# Patient Record
Sex: Female | Born: 1975 | Race: Black or African American | Hispanic: Refuse to answer | Marital: Married
Health system: Midwestern US, Community
[De-identification: ages and names within clinical notes are randomized; demographics above are authoritative.]

## PROBLEM LIST (undated history)

## (undated) ENCOUNTER — Emergency Department (HOSPITAL_BASED_OUTPATIENT_CLINIC_OR_DEPARTMENT_OTHER): Admission: EM

## (undated) DIAGNOSIS — F32A Depression, unspecified: Secondary | ICD-10-CM

## (undated) DIAGNOSIS — F319 Bipolar disorder, unspecified: Secondary | ICD-10-CM

## (undated) DIAGNOSIS — F419 Anxiety disorder, unspecified: Secondary | ICD-10-CM

## (undated) DIAGNOSIS — M1990 Unspecified osteoarthritis, unspecified site: Secondary | ICD-10-CM

## (undated) DIAGNOSIS — F909 Attention-deficit hyperactivity disorder, unspecified type: Secondary | ICD-10-CM

## (undated) DIAGNOSIS — F329 Major depressive disorder, single episode, unspecified: Secondary | ICD-10-CM

## (undated) DIAGNOSIS — F3181 Bipolar II disorder: Secondary | ICD-10-CM

## (undated) DIAGNOSIS — I10 Essential (primary) hypertension: Secondary | ICD-10-CM

## (undated) DIAGNOSIS — N289 Disorder of kidney and ureter, unspecified: Secondary | ICD-10-CM

## (undated) HISTORY — PX: GASTRIC BYPASS: SHX52

## (undated) HISTORY — DX: Attention-deficit hyperactivity disorder, unspecified type: F90.9

## (undated) HISTORY — DX: Bipolar II disorder: F31.81

## (undated) HISTORY — PX: COLONOSCOPY: SHX174

## (undated) HISTORY — PX: KNEE SURGERY: SHX244

## (undated) HISTORY — PX: CHOLECYSTECTOMY: SHX55

---

## 1998-07-07 ENCOUNTER — Encounter: Admission: RE | Admit: 1998-07-07 | Discharge: 1998-07-07 | Payer: Self-pay | Admitting: *Deleted

## 1998-10-16 ENCOUNTER — Ambulatory Visit (HOSPITAL_COMMUNITY): Admission: RE | Admit: 1998-10-16 | Discharge: 1998-10-16 | Payer: Self-pay | Admitting: Family Medicine

## 1998-10-16 ENCOUNTER — Encounter: Payer: Self-pay | Admitting: Family Medicine

## 1999-04-16 ENCOUNTER — Ambulatory Visit (HOSPITAL_COMMUNITY): Admission: RE | Admit: 1999-04-16 | Discharge: 1999-04-16 | Payer: Self-pay | Admitting: Family Medicine

## 1999-04-16 ENCOUNTER — Encounter: Payer: Self-pay | Admitting: Family Medicine

## 1999-07-15 ENCOUNTER — Encounter: Payer: Self-pay | Admitting: Emergency Medicine

## 1999-07-15 ENCOUNTER — Emergency Department (HOSPITAL_COMMUNITY): Admission: EM | Admit: 1999-07-15 | Discharge: 1999-07-15 | Payer: Self-pay | Admitting: Emergency Medicine

## 1999-08-28 ENCOUNTER — Other Ambulatory Visit: Admission: RE | Admit: 1999-08-28 | Discharge: 1999-08-28 | Payer: Self-pay | Admitting: *Deleted

## 1999-09-30 ENCOUNTER — Emergency Department (HOSPITAL_COMMUNITY): Admission: EM | Admit: 1999-09-30 | Discharge: 1999-09-30 | Payer: Self-pay | Admitting: Emergency Medicine

## 1999-09-30 ENCOUNTER — Encounter: Payer: Self-pay | Admitting: Emergency Medicine

## 1999-10-09 ENCOUNTER — Encounter: Payer: Self-pay | Admitting: Family Medicine

## 1999-10-09 ENCOUNTER — Ambulatory Visit (HOSPITAL_COMMUNITY): Admission: RE | Admit: 1999-10-09 | Discharge: 1999-10-09 | Payer: Self-pay | Admitting: Family Medicine

## 1999-12-24 ENCOUNTER — Emergency Department (HOSPITAL_COMMUNITY): Admission: EM | Admit: 1999-12-24 | Discharge: 1999-12-24 | Payer: Self-pay | Admitting: Emergency Medicine

## 1999-12-24 ENCOUNTER — Other Ambulatory Visit: Admission: RE | Admit: 1999-12-24 | Discharge: 1999-12-24 | Payer: Self-pay | Admitting: General Surgery

## 2002-08-26 ENCOUNTER — Inpatient Hospital Stay (HOSPITAL_COMMUNITY): Admission: AD | Admit: 2002-08-26 | Discharge: 2002-08-31 | Payer: Self-pay | Admitting: *Deleted

## 2002-08-26 ENCOUNTER — Encounter: Payer: Self-pay | Admitting: *Deleted

## 2002-09-28 ENCOUNTER — Inpatient Hospital Stay (HOSPITAL_COMMUNITY): Admission: EM | Admit: 2002-09-28 | Discharge: 2002-10-01 | Payer: Self-pay | Admitting: Internal Medicine

## 2002-09-29 ENCOUNTER — Encounter: Payer: Self-pay | Admitting: Gynecology

## 2002-10-23 ENCOUNTER — Other Ambulatory Visit: Admission: RE | Admit: 2002-10-23 | Discharge: 2002-10-23 | Payer: Self-pay | Admitting: *Deleted

## 2002-11-06 ENCOUNTER — Ambulatory Visit (HOSPITAL_COMMUNITY): Admission: RE | Admit: 2002-11-06 | Discharge: 2002-11-06 | Payer: Self-pay | Admitting: *Deleted

## 2002-12-14 ENCOUNTER — Inpatient Hospital Stay (HOSPITAL_COMMUNITY): Admission: AD | Admit: 2002-12-14 | Discharge: 2002-12-14 | Payer: Self-pay | Admitting: Gynecology

## 2002-12-18 ENCOUNTER — Inpatient Hospital Stay (HOSPITAL_COMMUNITY): Admission: AD | Admit: 2002-12-18 | Discharge: 2002-12-18 | Payer: Self-pay | Admitting: Gynecology

## 2003-02-04 ENCOUNTER — Inpatient Hospital Stay (HOSPITAL_COMMUNITY): Admission: AD | Admit: 2003-02-04 | Discharge: 2003-02-04 | Payer: Self-pay | Admitting: Gynecology

## 2003-03-11 ENCOUNTER — Observation Stay (HOSPITAL_COMMUNITY): Admission: AD | Admit: 2003-03-11 | Discharge: 2003-03-11 | Payer: Self-pay | Admitting: Gynecology

## 2003-04-22 ENCOUNTER — Encounter (INDEPENDENT_AMBULATORY_CARE_PROVIDER_SITE_OTHER): Payer: Self-pay | Admitting: Specialist

## 2003-04-22 ENCOUNTER — Inpatient Hospital Stay (HOSPITAL_COMMUNITY): Admission: AD | Admit: 2003-04-22 | Discharge: 2003-04-26 | Payer: Self-pay | Admitting: Gynecology

## 2003-04-22 ENCOUNTER — Encounter: Payer: Self-pay | Admitting: Gynecology

## 2003-05-04 ENCOUNTER — Observation Stay (HOSPITAL_COMMUNITY): Admission: AD | Admit: 2003-05-04 | Discharge: 2003-05-05 | Payer: Self-pay | Admitting: Gynecology

## 2003-05-06 ENCOUNTER — Inpatient Hospital Stay (HOSPITAL_COMMUNITY): Admission: AD | Admit: 2003-05-06 | Discharge: 2003-05-06 | Payer: Self-pay | Admitting: Gynecology

## 2003-06-13 ENCOUNTER — Other Ambulatory Visit: Admission: RE | Admit: 2003-06-13 | Discharge: 2003-06-13 | Payer: Self-pay | Admitting: Gynecology

## 2004-05-08 ENCOUNTER — Emergency Department (HOSPITAL_COMMUNITY): Admission: EM | Admit: 2004-05-08 | Discharge: 2004-05-09 | Payer: Self-pay | Admitting: Emergency Medicine

## 2004-10-24 ENCOUNTER — Emergency Department (HOSPITAL_COMMUNITY): Admission: EM | Admit: 2004-10-24 | Discharge: 2004-10-25 | Payer: Self-pay | Admitting: Emergency Medicine

## 2004-11-21 ENCOUNTER — Emergency Department (HOSPITAL_COMMUNITY): Admission: EM | Admit: 2004-11-21 | Discharge: 2004-11-21 | Payer: Self-pay | Admitting: Emergency Medicine

## 2005-01-18 ENCOUNTER — Emergency Department (HOSPITAL_COMMUNITY): Admission: EM | Admit: 2005-01-18 | Discharge: 2005-01-18 | Payer: Self-pay | Admitting: Emergency Medicine

## 2005-03-01 ENCOUNTER — Ambulatory Visit (HOSPITAL_COMMUNITY): Admission: RE | Admit: 2005-03-01 | Discharge: 2005-03-01 | Payer: Self-pay | Admitting: Cardiology

## 2005-03-07 ENCOUNTER — Emergency Department (HOSPITAL_COMMUNITY): Admission: EM | Admit: 2005-03-07 | Discharge: 2005-03-07 | Payer: Self-pay | Admitting: Emergency Medicine

## 2005-04-14 ENCOUNTER — Emergency Department (HOSPITAL_COMMUNITY): Admission: EM | Admit: 2005-04-14 | Discharge: 2005-04-14 | Payer: Self-pay | Admitting: Emergency Medicine

## 2005-06-15 ENCOUNTER — Emergency Department (HOSPITAL_COMMUNITY): Admission: EM | Admit: 2005-06-15 | Discharge: 2005-06-16 | Payer: Self-pay | Admitting: Emergency Medicine

## 2005-07-08 ENCOUNTER — Emergency Department (HOSPITAL_COMMUNITY): Admission: EM | Admit: 2005-07-08 | Discharge: 2005-07-08 | Payer: Self-pay | Admitting: Emergency Medicine

## 2005-08-10 ENCOUNTER — Encounter: Admission: RE | Admit: 2005-08-10 | Discharge: 2005-08-10 | Payer: Self-pay | Admitting: Internal Medicine

## 2005-09-10 ENCOUNTER — Ambulatory Visit (HOSPITAL_COMMUNITY): Admission: RE | Admit: 2005-09-10 | Discharge: 2005-09-10 | Payer: Self-pay | Admitting: Family Medicine

## 2005-09-20 ENCOUNTER — Emergency Department (HOSPITAL_COMMUNITY): Admission: EM | Admit: 2005-09-20 | Discharge: 2005-09-20 | Payer: Self-pay | Admitting: Emergency Medicine

## 2005-10-22 ENCOUNTER — Emergency Department (HOSPITAL_COMMUNITY): Admission: EM | Admit: 2005-10-22 | Discharge: 2005-10-22 | Payer: Self-pay | Admitting: Emergency Medicine

## 2006-01-10 ENCOUNTER — Encounter: Admission: RE | Admit: 2006-01-10 | Discharge: 2006-01-10 | Payer: Self-pay | Admitting: Family Medicine

## 2006-01-14 ENCOUNTER — Emergency Department (HOSPITAL_COMMUNITY): Admission: EM | Admit: 2006-01-14 | Discharge: 2006-01-14 | Payer: Self-pay | Admitting: Emergency Medicine

## 2006-01-29 ENCOUNTER — Emergency Department (HOSPITAL_COMMUNITY): Admission: EM | Admit: 2006-01-29 | Discharge: 2006-01-30 | Payer: Self-pay | Admitting: Emergency Medicine

## 2006-01-30 ENCOUNTER — Emergency Department (HOSPITAL_COMMUNITY): Admission: EM | Admit: 2006-01-30 | Discharge: 2006-01-30 | Payer: Self-pay | Admitting: Emergency Medicine

## 2006-07-12 ENCOUNTER — Emergency Department (HOSPITAL_COMMUNITY): Admission: EM | Admit: 2006-07-12 | Discharge: 2006-07-13 | Payer: Self-pay | Admitting: Emergency Medicine

## 2006-07-30 ENCOUNTER — Emergency Department (HOSPITAL_COMMUNITY): Admission: EM | Admit: 2006-07-30 | Discharge: 2006-07-31 | Payer: Self-pay | Admitting: Emergency Medicine

## 2006-09-22 ENCOUNTER — Other Ambulatory Visit: Admission: RE | Admit: 2006-09-22 | Discharge: 2006-09-22 | Payer: Self-pay | Admitting: Family Medicine

## 2008-06-19 ENCOUNTER — Emergency Department (HOSPITAL_COMMUNITY): Admission: EM | Admit: 2008-06-19 | Discharge: 2008-06-19 | Payer: Self-pay | Admitting: Emergency Medicine

## 2008-06-25 ENCOUNTER — Encounter (INDEPENDENT_AMBULATORY_CARE_PROVIDER_SITE_OTHER): Payer: Self-pay | Admitting: *Deleted

## 2008-06-25 ENCOUNTER — Ambulatory Visit (HOSPITAL_COMMUNITY): Admission: RE | Admit: 2008-06-25 | Discharge: 2008-06-26 | Payer: Self-pay | Admitting: *Deleted

## 2008-06-26 ENCOUNTER — Emergency Department (HOSPITAL_COMMUNITY): Admission: EM | Admit: 2008-06-26 | Discharge: 2008-06-26 | Payer: Self-pay | Admitting: Emergency Medicine

## 2008-11-14 ENCOUNTER — Emergency Department (HOSPITAL_COMMUNITY): Admission: EM | Admit: 2008-11-14 | Discharge: 2008-11-14 | Payer: Self-pay | Admitting: Emergency Medicine

## 2008-12-15 ENCOUNTER — Emergency Department (HOSPITAL_COMMUNITY): Admission: EM | Admit: 2008-12-15 | Discharge: 2008-12-16 | Payer: Self-pay | Admitting: Emergency Medicine

## 2009-04-11 ENCOUNTER — Encounter: Admission: RE | Admit: 2009-04-11 | Discharge: 2009-04-11 | Payer: Self-pay | Admitting: Family Medicine

## 2009-06-09 ENCOUNTER — Emergency Department (HOSPITAL_BASED_OUTPATIENT_CLINIC_OR_DEPARTMENT_OTHER): Admission: EM | Admit: 2009-06-09 | Discharge: 2009-06-09 | Payer: Self-pay | Admitting: Emergency Medicine

## 2009-07-15 ENCOUNTER — Emergency Department (HOSPITAL_BASED_OUTPATIENT_CLINIC_OR_DEPARTMENT_OTHER): Admission: EM | Admit: 2009-07-15 | Discharge: 2009-07-15 | Payer: Self-pay | Admitting: Emergency Medicine

## 2009-09-19 ENCOUNTER — Ambulatory Visit: Payer: Self-pay | Admitting: Diagnostic Radiology

## 2009-09-19 ENCOUNTER — Emergency Department (HOSPITAL_BASED_OUTPATIENT_CLINIC_OR_DEPARTMENT_OTHER): Admission: EM | Admit: 2009-09-19 | Discharge: 2009-09-19 | Payer: Self-pay | Admitting: Emergency Medicine

## 2009-11-11 ENCOUNTER — Encounter: Admission: RE | Admit: 2009-11-11 | Discharge: 2009-11-11 | Payer: Self-pay | Admitting: Internal Medicine

## 2009-12-25 ENCOUNTER — Emergency Department (HOSPITAL_BASED_OUTPATIENT_CLINIC_OR_DEPARTMENT_OTHER): Admission: EM | Admit: 2009-12-25 | Discharge: 2009-12-26 | Payer: Self-pay | Admitting: Emergency Medicine

## 2009-12-25 ENCOUNTER — Ambulatory Visit: Payer: Self-pay | Admitting: Diagnostic Radiology

## 2010-02-23 ENCOUNTER — Ambulatory Visit: Payer: Self-pay | Admitting: Diagnostic Radiology

## 2010-02-23 ENCOUNTER — Emergency Department (HOSPITAL_BASED_OUTPATIENT_CLINIC_OR_DEPARTMENT_OTHER): Admission: EM | Admit: 2010-02-23 | Discharge: 2010-02-23 | Payer: Self-pay | Admitting: Emergency Medicine

## 2010-03-19 ENCOUNTER — Emergency Department (HOSPITAL_BASED_OUTPATIENT_CLINIC_OR_DEPARTMENT_OTHER): Admission: EM | Admit: 2010-03-19 | Discharge: 2010-03-19 | Payer: Self-pay | Admitting: Emergency Medicine

## 2010-04-27 ENCOUNTER — Encounter: Admission: RE | Admit: 2010-04-27 | Discharge: 2010-04-27 | Payer: Self-pay | Admitting: Surgery

## 2010-05-27 ENCOUNTER — Ambulatory Visit: Payer: Self-pay | Admitting: Radiology

## 2010-05-27 ENCOUNTER — Emergency Department (HOSPITAL_BASED_OUTPATIENT_CLINIC_OR_DEPARTMENT_OTHER): Admission: EM | Admit: 2010-05-27 | Discharge: 2010-05-27 | Payer: Self-pay | Admitting: Emergency Medicine

## 2010-10-17 ENCOUNTER — Emergency Department (HOSPITAL_BASED_OUTPATIENT_CLINIC_OR_DEPARTMENT_OTHER): Admission: EM | Admit: 2010-10-17 | Discharge: 2010-10-17 | Payer: Self-pay | Admitting: Emergency Medicine

## 2010-11-12 ENCOUNTER — Emergency Department (HOSPITAL_COMMUNITY)
Admission: EM | Admit: 2010-11-12 | Discharge: 2010-11-12 | Payer: Self-pay | Source: Home / Self Care | Admitting: Emergency Medicine

## 2010-12-10 ENCOUNTER — Emergency Department (HOSPITAL_COMMUNITY)
Admission: EM | Admit: 2010-12-10 | Discharge: 2010-12-10 | Payer: Self-pay | Source: Home / Self Care | Admitting: Emergency Medicine

## 2011-01-03 ENCOUNTER — Encounter: Payer: Self-pay | Admitting: Surgery

## 2011-01-03 ENCOUNTER — Encounter: Payer: Self-pay | Admitting: Cardiology

## 2011-01-03 ENCOUNTER — Encounter: Payer: Self-pay | Admitting: Family Medicine

## 2011-01-06 ENCOUNTER — Ambulatory Visit: Admit: 2011-01-06 | Payer: Self-pay | Admitting: Internal Medicine

## 2011-02-10 ENCOUNTER — Emergency Department (HOSPITAL_BASED_OUTPATIENT_CLINIC_OR_DEPARTMENT_OTHER)
Admission: EM | Admit: 2011-02-10 | Discharge: 2011-02-11 | Disposition: A | Discharge status: Home / Self Care | Payer: BC Managed Care – PPO | Attending: Emergency Medicine | Admitting: Emergency Medicine

## 2011-02-10 DIAGNOSIS — Z79899 Other long term (current) drug therapy: Secondary | ICD-10-CM | POA: Insufficient documentation

## 2011-02-10 DIAGNOSIS — F319 Bipolar disorder, unspecified: Secondary | ICD-10-CM | POA: Insufficient documentation

## 2011-02-10 DIAGNOSIS — N946 Dysmenorrhea, unspecified: Secondary | ICD-10-CM | POA: Insufficient documentation

## 2011-02-10 DIAGNOSIS — F988 Other specified behavioral and emotional disorders with onset usually occurring in childhood and adolescence: Secondary | ICD-10-CM | POA: Insufficient documentation

## 2011-02-10 DIAGNOSIS — I10 Essential (primary) hypertension: Secondary | ICD-10-CM | POA: Insufficient documentation

## 2011-02-10 DIAGNOSIS — E119 Type 2 diabetes mellitus without complications: Secondary | ICD-10-CM | POA: Insufficient documentation

## 2011-02-10 DIAGNOSIS — F17200 Nicotine dependence, unspecified, uncomplicated: Secondary | ICD-10-CM | POA: Insufficient documentation

## 2011-02-11 LAB — URINE MICROSCOPIC-ADD ON

## 2011-02-11 LAB — PREGNANCY, URINE: Preg Test, Ur: NEGATIVE

## 2011-02-11 LAB — URINALYSIS, ROUTINE W REFLEX MICROSCOPIC
Protein, ur: 30 mg/dL — AB
Urine Glucose, Fasting: >1000 mg/dL — AB

## 2011-02-11 LAB — WET PREP, GENITAL
Trich, Wet Prep: NONE SEEN
Yeast Wet Prep HPF POC: NONE SEEN

## 2011-02-22 ENCOUNTER — Encounter: Payer: BC Managed Care – PPO | Attending: Physical Medicine and Rehabilitation

## 2011-02-22 ENCOUNTER — Ambulatory Visit: Payer: BC Managed Care – PPO | Admitting: Physical Medicine and Rehabilitation

## 2011-02-22 LAB — DIFFERENTIAL
Basophils Absolute: 0 10*3/uL (ref 0.0–0.1)
Basophils Relative: 0 % (ref 0–1)
Eosinophils Absolute: 0.1 10*3/uL (ref 0.0–0.7)
Eosinophils Relative: 1 % (ref 0–5)
Lymphs Abs: 2.6 10*3/uL (ref 0.7–4.0)
Monocytes Absolute: 0.5 10*3/uL (ref 0.1–1.0)
Monocytes Relative: 6 % (ref 3–12)
Monocytes Relative: 6 % (ref 3–12)
Neutro Abs: 7.3 10*3/uL (ref 1.7–7.7)
Neutrophils Relative %: 59 % (ref 43–77)

## 2011-02-22 LAB — POCT I-STAT 3, VENOUS BLOOD GAS (G3P V)
Acid-Base Excess: 7 mmol/L — ABNORMAL HIGH (ref 0.0–2.0)
O2 Saturation: 83 %
TCO2: 32 mmol/L (ref 0–100)

## 2011-02-22 LAB — URINE MICROSCOPIC-ADD ON

## 2011-02-22 LAB — COMPREHENSIVE METABOLIC PANEL
Alkaline Phosphatase: 72 U/L (ref 39–117)
BUN: 4 mg/dL — ABNORMAL LOW (ref 6–23)
GFR calc non Af Amer: >60 mL/min (ref >60)
Glucose, Bld: 222 mg/dL — ABNORMAL HIGH (ref 70–99)
Potassium: 3.4 mEq/L — ABNORMAL LOW (ref 3.5–5.1)
Total Bilirubin: 0.6 mg/dL (ref 0.3–1.2)
Total Protein: 6.4 g/dL (ref 6.0–8.3)

## 2011-02-22 LAB — URINALYSIS, ROUTINE W REFLEX MICROSCOPIC
Bilirubin Urine: NEGATIVE
Glucose, UA: >1000 mg/dL — AB
Hgb urine dipstick: NEGATIVE
Hgb urine dipstick: NEGATIVE
Ketones, ur: NEGATIVE mg/dL
Leukocytes, UA: NEGATIVE
Protein, ur: NEGATIVE mg/dL
Specific Gravity, Urine: 1.011 (ref 1.005–1.030)
Urobilinogen, UA: 0.2 mg/dL (ref 0.0–1.0)
pH: 6 (ref 5.0–8.0)

## 2011-02-22 LAB — RAPID URINE DRUG SCREEN, HOSP PERFORMED
Barbiturates: NOT DETECTED
Benzodiazepines: POSITIVE — AB
Opiates: NOT DETECTED

## 2011-02-22 LAB — LITHIUM LEVEL: Lithium Lvl: <0.25 mEq/L — ABNORMAL LOW (ref 0.80–1.40)

## 2011-02-22 LAB — CBC
HCT: 35.1 % — ABNORMAL LOW (ref 36.0–46.0)
Hemoglobin: 11.7 g/dL — ABNORMAL LOW (ref 12.0–15.0)
MCH: 27.3 pg (ref 26.0–34.0)
MCV: 86 fL (ref 78.0–100.0)
MCV: 85.4 fL (ref 78.0–100.0)
RBC: 4.28 MIL/uL (ref 3.87–5.11)
RDW: 13.1 % (ref 11.5–15.5)
WBC: 12.2 10*3/uL — ABNORMAL HIGH (ref 4.0–10.5)

## 2011-02-22 LAB — POCT PREGNANCY, URINE: Preg Test, Ur: NEGATIVE

## 2011-02-22 LAB — LIPASE, BLOOD: Lipase: 17 U/L (ref 11–59)

## 2011-02-23 ENCOUNTER — Inpatient Hospital Stay (HOSPITAL_COMMUNITY)
Admission: RE | Admit: 2011-02-23 | Discharge: 2011-02-28 | DRG: 425 | Disposition: A | Discharge status: Home / Self Care | Payer: BC Managed Care – PPO | Attending: Psychiatry | Admitting: Psychiatry

## 2011-02-23 DIAGNOSIS — D649 Anemia, unspecified: Secondary | ICD-10-CM

## 2011-02-23 DIAGNOSIS — IMO0001 Reserved for inherently not codable concepts without codable children: Secondary | ICD-10-CM

## 2011-02-23 DIAGNOSIS — Z794 Long term (current) use of insulin: Secondary | ICD-10-CM

## 2011-02-23 DIAGNOSIS — Z9884 Bariatric surgery status: Secondary | ICD-10-CM

## 2011-02-23 DIAGNOSIS — IMO0002 Reserved for concepts with insufficient information to code with codable children: Secondary | ICD-10-CM

## 2011-02-23 DIAGNOSIS — M1990 Unspecified osteoarthritis, unspecified site: Secondary | ICD-10-CM

## 2011-02-23 DIAGNOSIS — Z818 Family history of other mental and behavioral disorders: Secondary | ICD-10-CM

## 2011-02-23 DIAGNOSIS — R45851 Suicidal ideations: Secondary | ICD-10-CM

## 2011-02-23 DIAGNOSIS — F319 Bipolar disorder, unspecified: Secondary | ICD-10-CM

## 2011-02-23 DIAGNOSIS — F430 Acute stress reaction: Principal | ICD-10-CM

## 2011-02-23 DIAGNOSIS — J45909 Unspecified asthma, uncomplicated: Secondary | ICD-10-CM

## 2011-02-23 DIAGNOSIS — F17200 Nicotine dependence, unspecified, uncomplicated: Secondary | ICD-10-CM

## 2011-02-23 DIAGNOSIS — I10 Essential (primary) hypertension: Secondary | ICD-10-CM

## 2011-02-23 DIAGNOSIS — E119 Type 2 diabetes mellitus without complications: Secondary | ICD-10-CM

## 2011-02-23 DIAGNOSIS — F909 Attention-deficit hyperactivity disorder, unspecified type: Secondary | ICD-10-CM

## 2011-02-23 LAB — URINALYSIS, ROUTINE W REFLEX MICROSCOPIC
Bilirubin Urine: NEGATIVE
Ketones, ur: NEGATIVE mg/dL
Nitrite: NEGATIVE
Protein, ur: NEGATIVE mg/dL
Urobilinogen, UA: 0.2 mg/dL (ref 0.0–1.0)

## 2011-02-23 LAB — GC/CHLAMYDIA PROBE AMP, GENITAL: Chlamydia, DNA Probe: NEGATIVE

## 2011-02-23 LAB — GLUCOSE, CAPILLARY
Glucose-Capillary: 180 mg/dL — ABNORMAL HIGH (ref 70–99)
Glucose-Capillary: 246 mg/dL — ABNORMAL HIGH (ref 70–99)

## 2011-02-23 LAB — WET PREP, GENITAL: Trich, Wet Prep: NONE SEEN

## 2011-02-23 LAB — PREGNANCY, URINE: Preg Test, Ur: NEGATIVE

## 2011-02-23 LAB — URINE CULTURE
Colony Count: 15000
Culture  Setup Time: 201111050536

## 2011-02-24 DIAGNOSIS — F430 Acute stress reaction: Secondary | ICD-10-CM

## 2011-02-24 DIAGNOSIS — F319 Bipolar disorder, unspecified: Secondary | ICD-10-CM

## 2011-02-24 DIAGNOSIS — F909 Attention-deficit hyperactivity disorder, unspecified type: Secondary | ICD-10-CM

## 2011-02-24 LAB — COMPREHENSIVE METABOLIC PANEL
ALT: 15 U/L (ref 0–35)
AST: 18 U/L (ref 0–37)
Albumin: 2.8 g/dL — ABNORMAL LOW (ref 3.5–5.2)
CO2: 29 mEq/L (ref 19–32)
Calcium: 8.4 mg/dL (ref 8.4–10.5)
GFR calc Af Amer: >60 mL/min (ref >60)
GFR calc non Af Amer: >60 mL/min (ref >60)
Sodium: 140 mEq/L (ref 135–145)
Total Protein: 6 g/dL (ref 6.0–8.3)

## 2011-02-24 LAB — CBC
Hemoglobin: 11 g/dL — ABNORMAL LOW (ref 12.0–15.0)
MCHC: 31.6 g/dL (ref 30.0–36.0)

## 2011-02-24 LAB — GLUCOSE, CAPILLARY: Glucose-Capillary: 226 mg/dL — ABNORMAL HIGH (ref 70–99)
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
## 2011-02-25 LAB — FOLATE: Folate: 10.1 ng/mL

## 2011-02-25 LAB — VITAMIN D 25 HYDROXY (VIT D DEFICIENCY, FRACTURES): Vit D, 25-Hydroxy: <10 ng/mL — ABNORMAL LOW (ref 30–89)

## 2011-02-25 LAB — GLUCOSE, CAPILLARY: Glucose-Capillary: 100 mg/dL — ABNORMAL HIGH (ref 70–99)

## 2011-02-25 LAB — VITAMIN B12: Vitamin B-12: 274 pg/mL (ref 211–911)

## 2011-02-26 ENCOUNTER — Other Ambulatory Visit (HOSPITAL_COMMUNITY): Payer: BC Managed Care – PPO | Admitting: Psychiatry

## 2011-02-26 LAB — HEMOGLOBIN A1C
Hgb A1c MFr Bld: 11.6 % — ABNORMAL HIGH (ref <5.7)
Mean Plasma Glucose: 286 mg/dL — ABNORMAL HIGH (ref <117)

## 2011-02-26 LAB — GLUCOSE, CAPILLARY
Glucose-Capillary: 82 mg/dL (ref 70–99)
Glucose-Capillary: 238 mg/dL — ABNORMAL HIGH (ref 70–99)

## 2011-02-27 LAB — GLUCOSE, CAPILLARY
Glucose-Capillary: 86 mg/dL (ref 70–99)
Glucose-Capillary: 192 mg/dL — ABNORMAL HIGH (ref 70–99)

## 2011-02-27 LAB — VITAMIN B12: Vitamin B-12: >2000 pg/mL — ABNORMAL HIGH (ref 211–911)

## 2011-02-28 LAB — GLUCOSE, CAPILLARY: Glucose-Capillary: 231 mg/dL — ABNORMAL HIGH (ref 70–99)

## 2011-03-01 ENCOUNTER — Other Ambulatory Visit (HOSPITAL_COMMUNITY): Payer: BC Managed Care – PPO | Attending: Psychiatry | Admitting: Psychiatry

## 2011-03-01 DIAGNOSIS — Z794 Long term (current) use of insulin: Secondary | ICD-10-CM | POA: Insufficient documentation

## 2011-03-01 DIAGNOSIS — IMO0001 Reserved for inherently not codable concepts without codable children: Secondary | ICD-10-CM | POA: Insufficient documentation

## 2011-03-01 DIAGNOSIS — I10 Essential (primary) hypertension: Secondary | ICD-10-CM | POA: Insufficient documentation

## 2011-03-01 DIAGNOSIS — F430 Acute stress reaction: Secondary | ICD-10-CM | POA: Insufficient documentation

## 2011-03-01 DIAGNOSIS — F319 Bipolar disorder, unspecified: Secondary | ICD-10-CM | POA: Insufficient documentation

## 2011-03-01 DIAGNOSIS — E119 Type 2 diabetes mellitus without complications: Secondary | ICD-10-CM | POA: Insufficient documentation

## 2011-03-01 DIAGNOSIS — F909 Attention-deficit hyperactivity disorder, unspecified type: Secondary | ICD-10-CM | POA: Insufficient documentation

## 2011-03-01 DIAGNOSIS — M1990 Unspecified osteoarthritis, unspecified site: Secondary | ICD-10-CM | POA: Insufficient documentation

## 2011-03-01 DIAGNOSIS — D649 Anemia, unspecified: Secondary | ICD-10-CM | POA: Insufficient documentation

## 2011-03-02 ENCOUNTER — Other Ambulatory Visit (HOSPITAL_BASED_OUTPATIENT_CLINIC_OR_DEPARTMENT_OTHER): Payer: BC Managed Care – PPO | Admitting: Psychiatry

## 2011-03-02 DIAGNOSIS — F3130 Bipolar disorder, current episode depressed, mild or moderate severity, unspecified: Secondary | ICD-10-CM

## 2011-03-02 DIAGNOSIS — F909 Attention-deficit hyperactivity disorder, unspecified type: Secondary | ICD-10-CM

## 2011-03-03 ENCOUNTER — Other Ambulatory Visit (HOSPITAL_COMMUNITY): Payer: BC Managed Care – PPO | Admitting: Psychiatry

## 2011-03-03 LAB — BASIC METABOLIC PANEL
CO2: 25 mEq/L (ref 19–32)
Chloride: 106 mEq/L (ref 96–112)
GFR calc Af Amer: >60 mL/min (ref >60)
Potassium: 4.4 mEq/L (ref 3.5–5.1)
Sodium: 142 mEq/L (ref 135–145)

## 2011-03-03 LAB — DIFFERENTIAL
Eosinophils Absolute: 0.1 10*3/uL (ref 0.0–0.7)
Eosinophils Relative: 1 % (ref 0–5)
Lymphs Abs: 1.4 10*3/uL (ref 0.7–4.0)
Monocytes Absolute: 0.4 10*3/uL (ref 0.1–1.0)
Monocytes Relative: 5 % (ref 3–12)
Neutrophils Relative %: 77 % (ref 43–77)

## 2011-03-03 LAB — GLUCOSE, CAPILLARY
Glucose-Capillary: 267 mg/dL — ABNORMAL HIGH (ref 70–99)
Glucose-Capillary: 411 mg/dL — ABNORMAL HIGH (ref 70–99)

## 2011-03-03 LAB — PREGNANCY, URINE: Preg Test, Ur: NEGATIVE

## 2011-03-03 LAB — URINALYSIS, ROUTINE W REFLEX MICROSCOPIC
Bilirubin Urine: NEGATIVE
Nitrite: POSITIVE — AB
Protein, ur: NEGATIVE mg/dL
Urobilinogen, UA: 0.2 mg/dL (ref 0.0–1.0)

## 2011-03-03 LAB — CBC
HCT: 36.5 % (ref 36.0–46.0)
Hemoglobin: 12.1 g/dL (ref 12.0–15.0)
MCV: 86.8 fL (ref 78.0–100.0)
RBC: 4.2 MIL/uL (ref 3.87–5.11)
WBC: 9 10*3/uL (ref 4.0–10.5)

## 2011-03-03 LAB — URINE CULTURE

## 2011-03-04 ENCOUNTER — Other Ambulatory Visit (HOSPITAL_COMMUNITY): Payer: BC Managed Care – PPO | Admitting: Psychiatry
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
## 2011-03-05 ENCOUNTER — Other Ambulatory Visit (HOSPITAL_COMMUNITY): Payer: BC Managed Care – PPO | Admitting: Psychiatry

## 2011-03-08 ENCOUNTER — Other Ambulatory Visit (HOSPITAL_COMMUNITY): Payer: BC Managed Care – PPO | Admitting: Psychiatry

## 2011-03-09 ENCOUNTER — Other Ambulatory Visit (HOSPITAL_COMMUNITY): Payer: BC Managed Care – PPO | Admitting: Psychiatry

## 2011-03-10 ENCOUNTER — Other Ambulatory Visit (HOSPITAL_COMMUNITY): Payer: BC Managed Care – PPO | Admitting: Psychiatry

## 2011-03-11 ENCOUNTER — Other Ambulatory Visit (HOSPITAL_COMMUNITY): Payer: BC Managed Care – PPO | Admitting: Psychiatry

## 2011-03-12 ENCOUNTER — Other Ambulatory Visit (HOSPITAL_COMMUNITY): Payer: BC Managed Care – PPO | Admitting: Psychiatry

## 2011-03-15 ENCOUNTER — Other Ambulatory Visit (HOSPITAL_COMMUNITY): Payer: BC Managed Care – PPO | Admitting: Psychiatry

## 2011-03-16 ENCOUNTER — Other Ambulatory Visit (HOSPITAL_COMMUNITY): Payer: BC Managed Care – PPO | Admitting: Psychiatry

## 2011-03-17 ENCOUNTER — Other Ambulatory Visit (HOSPITAL_COMMUNITY): Payer: BC Managed Care – PPO | Admitting: Psychiatry

## 2011-03-18 ENCOUNTER — Other Ambulatory Visit (HOSPITAL_COMMUNITY): Payer: BC Managed Care – PPO | Admitting: Psychiatry

## 2011-03-18 LAB — URINALYSIS, ROUTINE W REFLEX MICROSCOPIC
Bilirubin Urine: NEGATIVE
Hgb urine dipstick: NEGATIVE
Specific Gravity, Urine: 1.031 — ABNORMAL HIGH (ref 1.005–1.030)
Urobilinogen, UA: 1 mg/dL (ref 0.0–1.0)
pH: 7 (ref 5.0–8.0)

## 2011-03-18 LAB — URINE MICROSCOPIC-ADD ON

## 2011-03-18 LAB — CBC
Hemoglobin: 11.7 g/dL — ABNORMAL LOW (ref 12.0–15.0)
RBC: 3.99 MIL/uL (ref 3.87–5.11)

## 2011-03-18 LAB — COMPREHENSIVE METABOLIC PANEL
ALT: 25 U/L (ref 0–35)
Alkaline Phosphatase: 94 U/L (ref 39–117)
CO2: 24 mEq/L (ref 19–32)
GFR calc non Af Amer: >60 mL/min (ref >60)
Glucose, Bld: 356 mg/dL — ABNORMAL HIGH (ref 70–99)
Potassium: 3.8 mEq/L (ref 3.5–5.1)
Sodium: 136 mEq/L (ref 135–145)

## 2011-03-18 LAB — GLUCOSE, CAPILLARY: Glucose-Capillary: 219 mg/dL — ABNORMAL HIGH (ref 70–99)

## 2011-03-18 LAB — WET PREP, GENITAL: Yeast Wet Prep HPF POC: NONE SEEN

## 2011-03-18 LAB — GC/CHLAMYDIA PROBE AMP, GENITAL: GC Probe Amp, Genital: NEGATIVE

## 2011-03-18 LAB — DIFFERENTIAL
Basophils Relative: 0 % (ref 0–1)
Eosinophils Absolute: 0.2 10*3/uL (ref 0.0–0.7)
Neutrophils Relative %: 57 % (ref 43–77)

## 2011-03-18 LAB — LIPASE, BLOOD: Lipase: 43 U/L (ref 23–300)

## 2011-03-20 LAB — RPR: RPR Ser Ql: NONREACTIVE

## 2011-03-20 LAB — GC/CHLAMYDIA PROBE AMP, GENITAL: GC Probe Amp, Genital: NEGATIVE

## 2011-03-20 LAB — GLUCOSE, CAPILLARY: Glucose-Capillary: 305 mg/dL — ABNORMAL HIGH (ref 70–99)

## 2011-03-20 LAB — WET PREP, GENITAL: Yeast Wet Prep HPF POC: NONE SEEN

## 2011-03-22 LAB — COMPREHENSIVE METABOLIC PANEL
ALT: 14 U/L (ref 0–35)
CO2: 27 mEq/L (ref 19–32)
Calcium: 8.5 mg/dL (ref 8.4–10.5)
Creatinine, Ser: 0.8 mg/dL (ref 0.4–1.2)
GFR calc Af Amer: >60 mL/min (ref >60)
GFR calc non Af Amer: >60 mL/min (ref >60)
Glucose, Bld: 506 mg/dL (ref 70–99)
Sodium: 138 mEq/L (ref 135–145)
Total Protein: 6.3 g/dL (ref 6.0–8.3)

## 2011-03-22 LAB — POCT I-STAT 3, VENOUS BLOOD GAS (G3P V)
Bicarbonate: 26.5 mEq/L — ABNORMAL HIGH (ref 20.0–24.0)
O2 Saturation: 84 %
TCO2: 28 mmol/L (ref 0–100)
pCO2, Ven: 42.9 mmHg — ABNORMAL LOW (ref 45.0–50.0)
pH, Ven: 7.399 — ABNORMAL HIGH (ref 7.250–7.300)
pO2, Ven: 49 mmHg — ABNORMAL HIGH (ref 30.0–45.0)

## 2011-03-22 LAB — CBC
Hemoglobin: 12.9 g/dL (ref 12.0–15.0)
MCHC: 34.2 g/dL (ref 30.0–36.0)
MCV: 88 fL (ref 78.0–100.0)
RDW: 11.8 % (ref 11.5–15.5)

## 2011-03-22 LAB — URINE CULTURE: Colony Count: >=100000

## 2011-03-22 LAB — URINALYSIS, ROUTINE W REFLEX MICROSCOPIC
Bilirubin Urine: NEGATIVE
Ketones, ur: 15 mg/dL — AB
Nitrite: POSITIVE — AB
Protein, ur: 30 mg/dL — AB

## 2011-03-22 LAB — GLUCOSE, CAPILLARY
Glucose-Capillary: 146 mg/dL — ABNORMAL HIGH (ref 70–99)
Glucose-Capillary: 207 mg/dL — ABNORMAL HIGH (ref 70–99)
Glucose-Capillary: 360 mg/dL — ABNORMAL HIGH (ref 70–99)
Glucose-Capillary: 49 mg/dL — ABNORMAL LOW (ref 70–99)
Glucose-Capillary: 497 mg/dL — ABNORMAL HIGH (ref 70–99)

## 2011-03-22 LAB — DIFFERENTIAL
Eosinophils Absolute: 0.1 10*3/uL (ref 0.0–0.7)
Lymphocytes Relative: 25 % (ref 12–46)
Lymphs Abs: 2.1 10*3/uL (ref 0.7–4.0)
Monocytes Relative: 7 % (ref 3–12)
Neutro Abs: 5.9 10*3/uL (ref 1.7–7.7)
Neutrophils Relative %: 67 % (ref 43–77)

## 2011-03-22 LAB — PREGNANCY, URINE: Preg Test, Ur: NEGATIVE

## 2011-04-27 NOTE — Op Note (Signed)
NAME:  Virginia [name], Virginia [name] NO.:  [id number]   MEDICAL RECORD NO.:  [id number]          PATIENT TYPE:  OIB   LOCATION:  1532                         FACILITY:  [facility]   PHYSICIAN:  Alfonse Ras, MD   DATE OF BIRTH:  1976-09-28   DATE OF PROCEDURE:  DATE OF DISCHARGE:                               OPERATIVE REPORT   DATE OF OPERATION:  06/25/2008.   DATE OF DICTATION:  06/25/2008.   PREOPERATIVE DIAGNOSIS:  Status post laparoscopic gastric bypass with  symptomatic cholelithiasis and elevated liver function tests.   POSTOPERATIVE DIAGNOSIS:  Status post laparoscopic gastric bypass with  symptomatic cholelithiasis and elevated liver function tests with a  small amount of choledocholithiasis of  free flow into the duodenum.   PROCEDURE:  Laparoscopic cholecystectomy with intraoperative  cholangiogram.   SURGEON:  Alfonse Ras, M.D.   ASSISTANT:  Thornton Park. Daphine Deutscher, M.D.   ANESTHESIA:  General.   DESCRIPTION:  The patient was taken to the operating room, placed in  supine position.  After adequate general anesthesia was induced using  endotracheal tube, the abdomen was prepped and draped in normal sterile  fashion.  Using transverse infraumbilical incision, dissection was done  on the fascia.  The fascia was opened vertically.  A 0 Vicryl  pursestring suture was placed around the fascial defect and Hasson  trocar was placed in the abdomen.  Pneumoperitoneum was obtained. Under  direct vision, two 5-mm ports were placed in the right abdomen.  Visual  inspection was made at the jejunostomy, and the mesentery showed no  mesenteric defect.  Peterson's defect was also inspected and appeared to  be close.  I then turned my attention to the gallbladder.  A 11-mm  trocar was placed in the subxiphoid region.  The gallbladder was  retracted cephalad.  It was minimally edematous.  Dissection of the  cystic duct was accomplished, and critical view was obtained with a  sharp and blunt dissection.  The cystic duct was clipped proximally at  the gallbladder and small ductotomy was made.  There was numerous amount  of very very small pigmented stones that were moved back through the  duct.  Reddick catheter was placed down through the cystic, duct and  cholangiogram was performed.  This showed multiple very small filling  defects but rapid and free flow into the duodenum.  No dilatation of  ducts.  Because of the small nature of these pigmented stones and  improvement in her liver function tests, I opted not to perform common  bile duct exploration.  I reviewed the films with Dr. Daphine Deutscher at the time  of the procedure.  We then removed the Reddick catheter and the distal  cystic duct was triply clipped and divided.  Cystic artery was  identified in a similar fashion triply clipped and divided.  Gallbladder  was taken off the gallbladder bed using Bovie electrocautery and removed  through the umbilical port without difficulty.  Adequate hemostasis was  ensured.  Pneumoperitoneum was released.  The infraumbilical fascial  defect  was closed with the 0 Vicryl pursestring suture.  Skin incisions were  injected with 0.5 Marcaine and closed with subcuticular 4-0 Monocryl.  Steri-Strips and dressings were applied.  The patient tolerated the  procedure well and went to PACU in good condition.      Alfonse Ras, MD  Electronically Signed     KRE/MEDQ  D:  06/25/2008  T:  06/26/2008  Job:  [id number]

## 2011-04-30 NOTE — Op Note (Signed)
NAME:  Virginia [name], Virginia [name] NO.:  [id number]   MEDICAL RECORD NO.:  [id number]                   PATIENT TYPE:  INP   LOCATION:  9123                                 FACILITY:  [facility]   PHYSICIAN:  Juan H. Lily Peer, M.D.             DATE OF BIRTH:  1976/11/02   DATE OF PROCEDURE:  04/22/2003  DATE OF DISCHARGE:                                 OPERATIVE REPORT   PREOPERATIVE DIAGNOSES:  1. Intrauterine pregnancy at 36-1/2 weeks' estimated gestational age.  2. Class B diabetic.  3. Nonreassuring fetal heart rate tracing.   POSTOPERATIVE DIAGNOSES:  1. Intrauterine pregnancy at 36-1/2 weeks' estimated gestational age.  2. Class B diabetic.  3. Nonreassuring fetal heart rate tracing.   PROCEDURE:  Emergency lower uterine segment transverse cesarean section.   SURGEON:  Juan H. Lily Peer, M.D.   ASSISTANTMarcial Pacas P. Fontaine, M.D.   ANESTHESIA:  Spinal.   INDICATION FOR PROCEDURE:  A 35 year old gravida 1, para 0, at approximately  36-1/2 weeks' estimated gestational age, class B diabetic on insulin pump,  had undergone attempted amniocentesis for fetal lung maturity with inability  to attain a safe pocket, and amniocentesis was aborted.  The patient was  placed for non-stress test after the procedure and had an episode of  bradycardia.  She was admitted for consideration for induction and  subsequently experienced a nonreassuring fetal heart rate tracing consisting  of deep variable decelerations and occasionally repetitive in nature.   FINDINGS:  1. Blood-tinged amniotic fluid.  2. Vertex presentation.  3. Viable female infant, Apgars of 9 and 9.  4. Arterial cord pH of 7.25.  5. Weight 5 pounds 2 ounces.  6. Normal maternal pelvic anatomy.   DESCRIPTION OF PROCEDURE:  After the patient was adequately counseled, she  was taken to the operating room, where she underwent successful spinal  anesthesia.  The abdomen, vagina, and perineum were  prepped and draped in  the usual sterile fashion.  A Foley catheter was inserted in an effort to  monitor urinary output.  A Pfannenstiel skin incision was made 2 cm from the  symphysis pubis.  The skin was carried out through the skin, subcutaneous  tissue, down to the rectus fascia, whereby a midline nick was made.  The  fascia was incised in transverse fashion.  The peritoneal cavity was entered  cautiously.  The bladder flap was established.  The lower uterine segment  was then incised in a transverse fashion.  Clear amniotic fluid was present.  The newborn's head was delivered.  It was at this point that the blood-  tinged amniotic fluid was evident.  The nasopharynx was bulb-suctioned.  There was no cord around the baby's neck.  The newborn was delivered.  The  cord was doubly clamped and excised.  Cord blood was obtained.  The newborn  was passed off to the pediatrician, who gave the above-mentioned  parameters.  After cord blood was obtained, the placenta was delivered from the  intrauterine cavity and submitted for histologic evaluation.  The uterus was  then exteriorized and the intrauterine cavity was swept clear of the  remaining products of conception.  The lower uterine segment transverse  incision was closed in a single locking stitch manner with 0 Vicryl suture.  Inspection in the pelvis demonstrated a normal uterus, tubes, and ovaries.  The uterus was placed back in the pelvic cavity.  The pelvic cavity was  copiously irrigated with normal saline solution.  The pelvic cavity was then  inspected and the incision was intact.  The visceral peritoneum was not  reapproximated, but the rectus fascia was closed using a running stitch of 0  Vicryl suture.  The subcutaneous bleeders were Bovie cauterized.  The skin  was reapproximated with skin clips, followed by placement of Xeroform gauze  and 4 x 8 dressing.  The patient was transferred to the recovery room with  stable vital  signs.  Blood loss for the procedure was 1000 mL.  Urine output  was 200 mL.  IV fluids was 2700 mL of lactated Ringer's.  She then received  1 g of Cefotan prophylactically after the umbilical cord was clamped and  cut.                                                Juan H. Lily Peer, M.D.    JHF/MEDQ  D:  04/22/2003  T:  04/23/2003  Job:  [id number]

## 2011-04-30 NOTE — H&P (Signed)
NAME:  Virginia [name], [name] NO.:  [id number]   MEDICAL RECORD NO.:  [id number]                   PATIENT TYPE:  INP   LOCATION:  0350                                 FACILITY:  [facility]   PHYSICIAN:  Thora Lance, M.D.               DATE OF BIRTH:  09/05/1976   DATE OF ADMISSION:  09/28/2002  DATE OF DISCHARGE:                                HISTORY & PHYSICAL   CHIEF COMPLAINT:  Nausea and vomiting.   HISTORY OF PRESENT ILLNESS:  This is a 35 year old black female with a  history of insulin requiring diabetes mellitus 2 who presented with a two  week history of daily nausea associated with approximately five episodes of  vomiting per day.  She denies any fevers, chills, significant abdominal  pain, dysuria, urinary frequency or urgency.  She has some low back pain.  She saw her primary care physician at [facility], Dr.  Julian Reil today, and was found to be pregnant.  She may have an urinary tract  infection.  She was sent to the hospital for IV fluids, nausea control, and  possible treatment of her urinary tract infection.  Her blood sugars have  been running in the mid 100's of late on her insulin.   PAST MEDICAL HISTORY:  Type 2 diabetes mellitus, insulin controlled.   PAST SURGICAL HISTORY:  Tonsillectomy and adenoidectomy.   ALLERGIES:  No known drug allergies.   MEDICATIONS:  Insulin 70/30 45 units subcutaneously q.a.m. and 42 units  subcutaneously q.p.m.   FAMILY HISTORY:  Hypertension, diabetes mellitus, breast cancer.   SOCIAL HISTORY:  She is married.  No children.  She is G1, P0, AB1.  No  tobacco, no alcohol.  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   REVIEW OF SYMPTOMS:  Otherwise negative.   PHYSICAL EXAMINATION:  GENERAL:  A well-appearing obese black female.  VITAL SIGNS:  Blood pressure is 155/83, heart rate 89, respirations 18,  temperature 98.8.  HEENT:  Mildly dry mucus membranes.  NECK:   Supple, no lymphadenopathy.  LUNGS:  Clear.  HEART:  Regular rate and rhythm without murmurs, rubs, or gallops.  ABDOMEN:  Obese, soft, nontender, normal bowel sounds.  EXTREMITIES:  No edema.  NEUROLOGIC:  Nonfocal.   LABORATORY DATA:  Pending.   ASSESSMENT:  1. Nausea and vomiting, question secondary to pregnancy versus an urinary     tract infection.  2. Dehydration.  3. Question urinary tract infection.  4. Type 2 diabetes mellitus.   PLAN:  Admit.  IV fluids.  Zofran p.r.n.  UA, cultures, and routine labs.  If UA is positive start Rocephin and obtain renal ultrasound.  Follow CBG's.  We will use outpatient insulin dosing for now.  Thora Lance, M.D.    Delorse Limber  D:  09/28/2002  T:  09/29/2002  Job:  [id number]   cc:   Dr. Carmel Sacramento at [facility]

## 2011-04-30 NOTE — H&P (Signed)
   NAME:  Virginia [name], Virginia [name]                           ACCOUNT NO.:  [id number]   MEDICAL RECORD NO.:  [id number]                   PATIENT TYPE:  INP   LOCATION:  9170                                 FACILITY:  [facility]   PHYSICIAN:  Timothy P. Fontaine, M.D.           DATE OF BIRTH:  1976-07-25   DATE OF ADMISSION:  04/22/2003  DATE OF DISCHARGE:                                HISTORY & PHYSICAL   CHIEF COMPLAINT:  1. Pregnancy at 36 weeks.  2. 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 Dagoberto Ligas.  3. Marginal AFI.  4. Spontaneous deceleration on external monitor.   HISTORY OF PRESENT ILLNESS:  A 35 year old G2 P0 AB1 female at [redacted] weeks  gestation being followed with class B diabetes on insulin pump, relatively  good control.  Underwent amniocentesis this morning for lung maturity in  anticipation for induction.  The patient was found to have a small fundal  pocket that initially was entered, although subsequently the fetus's lower  extremities moved into the area and I was unable to obtain a sample for lung  maturity.  The AFI was subsequently calculated at 9.8 which is low normal.  She was subsequently monitored with NST following the attempted  amniocentesis and she had initially a reassuring tracing with a subsequent  spontaneous deceleration abruptly to the 50s which returned within a minute  to recover and then overall by five minutes to a normal baseline.  The  patient is admitted at this time due to the spontaneous deceleration as well  as she was scheduled for a planned induction starting this evening pending  the amniocentesis results.  Given the borderline AFI, spontaneous  deceleration on NST, it has been elected to admit her now for serial  induction.   For the remainder of her history and physical see the [facility].   ASSESSMENT AND PLAN:  A 35 year old G2 P0 AB1 female, class B diabetes on  insulin pump, Dr. Dagoberto Ligas her endocrinologist, for serial  induction.  Attempted amniocentesis was unsuccessful for fluid.  The options for  expectant management with antepartum testing and induction in a week versus  induction now was discussed with her.  Given the marginal AFI and the  spontaneous deceleration on external monitor we feel the most prudent thing  would be to proceed with serial induction.  The patient does have a history  of HSV although has no recent outbreaks to preclude attempted induction.                                               Timothy P. Audie Box, M.D.    TPF/MEDQ  D:  04/22/2003  T:  04/22/2003  Job:  [id number]

## 2011-04-30 NOTE — H&P (Signed)
NAME:  Virginia [name], Virginia [name] NO.:  [id number]   MEDICAL RECORD NO.:  [id number]                   PATIENT TYPE:  MAT   LOCATION:  MATC                                 [facility]ILITY[facility]   PHYSICIAN:  Timothy P. Fontaine, M.D.           DATE OF BIRTH:  05-31-1976   DATE OF ADMISSION:  05/04/2003  DATE OF DISCHARGE:                                HISTORY & PHYSICAL   CHIEF COMPLAINT:  Wound drainage.   HISTORY OF PRESENT ILLNESS:  A 35 year old, G1, P35 female status post  cesarean delivery 04/22/03.  She has a history of insulin pump requiring  diabetes, and her postoperative course was complicated by anemia requiring  transfusion.  Her discharge hemoglobin was 8.4.  She did well until the last  several days, at which point she noted some incisional drainage, and  ultimately awoke today with heavy bleeding from her incision that soaked  through her mattress.  Exam today is consistent with a hematoma, and she is  admitted at this time for surgical evacuation.   PAST MEDICAL HISTORY:  1. Insulin pump requiring diabetes followed by Dr. Dagoberto Ligas.  2. Asthma.   PAST SURGICAL HISTORY:  Cesarean section.   ALLERGIES:  None.   CURRENT MEDICATIONS:  1. Insulin pump.  2. NovoLog.   REVIEW OF SYSTEMS:  Noncontributory.   SOCIAL HISTORY:  Noncontributory.   FAMILY HISTORY:  Noncontributory.   ADMISSION PHYSICAL EXAMINATION:  GENERAL:  Per anesthesia.  ABDOMEN:  Benign abdomen.  Incision within the panniculus.  No peri-  incisional erythema or evidence of infection, lateral aspect on the left,  with a several mm defect initially with a small amount of oozing dark blood;  with pressure along the incision line more copious amounts of dark blood  extruded.  PELVIC:  Deferred.   ASSESSMENT:  A 35 year old, G1, P14 female post cesarean section  approximately 11 days.  Onset of oozing over the past several days which has  culminated in copious amounts of oozing now.   Physical exam most consistent  with a hematoma which now is draining.  No overt evidence of infection.  I  discussed with the patient and her mother the situation, and I feel the most  prudent course, given her total picture, is to go and reopen her incision to  evacuate any remaining hematoma, clean out the area and pack it, and at  least tentatively plan for closure by secondary intention.  Possibility for  reclosure in the future after initial granulation growth was discussed.  Given her diabetes, and certainly her increased risk for wound  complications, I do not feel observation is prudent without opening the  incision, particularly given the copious amounts of old blood extruding.  The patient agrees with the plan, and will  plan on doing this in the OR, given she was quite tender as I was pushing on  her incision, and do not  think that she would tolerate I&D in the triage  area.  I am going to plan to admit over this evening, and then discharge  tomorrow after a fresh dressing change and arrange for outpatient wound  care.                                               Timothy P. Audie Box, M.D.    TPF/MEDQ  D:  05/04/2003  T:  05/04/2003  Job:  [id number]

## 2011-04-30 NOTE — Discharge Summary (Signed)
   NAME:  Virginia [name], STOECKER NO.:  [id number]   MEDICAL RECORD NO.:  [id number]                   PATIENT TYPE:  INP   LOCATION:  9305                                 [facility]ACILITY:  [facility]   PHYSICIAN:  Katy Fitch, MD                 DATE OF BIRTH:  07/27/1976   DATE OF ADMISSION:  08/26/2002  DATE OF DISCHARGE:  08/31/2002                                 DISCHARGE SUMMARY   DISCHARGE DIAGNOSES:  1. Primary outbreak of herpes simplex virus.  2. Type 1 diabetes.   HISTORY:  This 35 year old female gravida 1, para 0 who was presented to  [facility] on August 25, 2002 complaining of burning pelvic  burning pain for the prior four days.  The patient was diagnosed with  genital herpes and sent home with Valtrex prescription, Lortab for pain.  Came back on August 26, 2002 to the emergency room with her pain more  excruciating, difficulty urinating.  The patient was examined.  There were  multiple excoriated lesions in the perineum, especially ______ the urethral  meatus.  The patient was unable to tolerate speculum exam.  Therefore,  patient was admitted for pain control for outbreak of herpes.   HOSPITAL COURSE:  On August 26, 2002 on admission patient was begun on  ________ 1000 mg IV q.8h.  Also empirically treated for HSV including  Rocephin 250 IM and doxycycline 100 mg p.o.  and checked Accu-Chek blood  sugars q.4h. with insulin sliding scale.  Also given a PCA for pain.  On  August 27, 2002 patient felt better, afebrile.  On insulin, 15 acyclovir.  On August 28, 2002 pain actually was not decreased by the PCA and PCA  dose was increased _______  three times a day.  On August 29, 2002  patient complained of a migraine headache.  Was given Imitrex for pain.  On  August 30, 2002 patient continued to have pain but it was better.  Given  Motrin alternated with Tylox for pain.  PCA was discontinued and Foley was  in place.  On  August 30, 2002 patient stated pain was better.  Denied  nausea or vomiting.  Was felt stable for discharge in satisfactory  condition.   DISPOSITION:  The patient is discharged home.  She is to return back in two  weeks for follow-up.  Have any problems prior to that time to be seen in the  office.     Susa Loffler, P.A.                    Katy Fitch, MD    TSG/MEDQ  D:  09/28/2002  T:  09/28/2002  Job:  [id number]

## 2011-04-30 NOTE — H&P (Signed)
NAME:  Virginia [name], Virginia [name] NO.:  [id number]   MEDICAL RECORD NO.:  [id number]                   PATIENT TYPE:  MAT   LOCATION:  MATC                                 [facility]   PHYSICIAN:  Devin M. Ciliberti, M.D.            DATE OF BIRTH:  14-Feb-1976   DATE OF ADMISSION:  08/26/2002  DATE OF DISCHARGE:                                HISTORY & PHYSICAL   CHIEF COMPLAINT:  Pelvic pain.   HISTORY OF PRESENT ILLNESS:  The patient is a 35 year old G1, P0, who  presented to [facility] Emergency Room on September 13 complaining of  pelvic burning and pain for the past four days.  The patient was diagnosed  with genital herpes and sent home with a Valtrex prescription and Lortab for  pain control.  The patient comes back today to the emergency room stating  her pain is much more excruciating and it is much more difficult to urinate  and has to urinate while in a warm tub to decrease the pain.  The patient  denies any problems such as fevers, nausea, vomiting, chills, or abdominal  pain.  The patient states she is sexually active and has never had a prior  sexually transmitted disease.  The patient states her husband has no other  evidence of lesions on his genital areas.   PAST MEDICAL HISTORY:  Type 1 diabetes which she takes insulin.   PAST SURGICAL HISTORY:  None.   OB HISTORY:  One spontaneous miscarriage.   CURRENT MEDICATIONS:  Insulin 42 units in the morning, 45 units insulin in  the evening.   ALLERGIES:  None.   SOCIAL HISTORY:  Without tobacco, alcohol, or drugs.   FAMILY HISTORY:  Without any epithelial cancers.   PHYSICAL EXAMINATION:  VITAL SIGNS:  Blood pressure is 152/79.  HEENT:  Clear.  LUNGS:  Clear to auscultation bilaterally.  HEART:  Regular rate and rhythm.  ABDOMEN:  Soft, nontender, no palpable masses.  EXTREMITIES:  Without edema, clubbing, cyanosis, tenderness.  PELVIC:  There are multiple excoriated lesions on the  perineal area,  especially around the clitoral and urethral meatus.  The patient was unable  to tolerate a speculum exam.   LABORATORY DATA:  Cultures for herpes and GC/Chlamydia were obtained  yesterday.    PLAN:  Will admit the patient for pain control and treatment of primary  herpes.  Will start her on Acyclovir 1000 mg IV q.8h.  Will also treat  empirically for STDs including Rocephin 250 mg IM and doxycycline 100 mg  p.o. b.i.d.  Will Accu-check sugars every four hours with an insulin sliding  scale.  We will check a CBC and CMET.  Will also give the patient a PCA for  pain.  Devin M. Ciliberti, M.D.    DMC/MEDQ  D:  08/26/2002  T:  08/26/2002  Job:  [id number]

## 2011-04-30 NOTE — Discharge Summary (Signed)
   Virginia [name], Virginia [name] NO.:  [id number]   MEDICAL RECORD NO.:  [id number]                   PATIENT TYPE:  OUT   LOCATION:  OMED                                 FACILITY:  [facility]   PHYSICIAN:  Katy Fitch, M.D.               DATE OF BIRTH:  1976/03/19   DATE OF ADMISSION:  08/26/2002  DATE OF DISCHARGE:  08/31/2002                                 DISCHARGE SUMMARY   DISCHARGE DIAGNOSES:  1. Primary herpetic infection.  2. Type 1 diabetes.  3. History of pelvic pain.   HISTORY OF PRESENT ILLNESS:  The patient is a 35 year old gravida 1, para 0,  who presented to [facility] Emergency Room on 08/25/02, complaining  of pelvic burning and pain for four days.  She was diagnosed with genital  herpes, sent home with a Valtrex prescription and Lortab.  The patient  presented on 08/26/02, complaining that the pain is much more excruciating  and has difficulty urinating.   PAST MEDICAL HISTORY:  Type 1 diabetes, for which she takes insulin 42 units  q.a.m. and 45 units q.p.m.   The patient is admitted for management of primary herpetic infection.  She  also states that she has never had a prior sexually transmitted disease, and  nor does her husband have a history of any genital lesions.   PLAN:  The patient was managed for pain control, treatment of primary herpes  with acyclovir 1000 mg IV q.8h., and will empirically be treated with for  two days with Rocephin 250 mg, doxycycline 100 mg p.o. b.i.d.  Accu-Check  sugars q.4h. of sliding scale insulin.  The patient was also given a PCA for  pain.   HOSPITAL COURSE:  After admission, the patient began to improve as far as  her pelvic pain.  Blood sugars were difficult to control.  She also  developed a migraine on 08/29/02, was given Imitrex 100 mg p.o. x1.  She  improved.  A Foley catheter was placed to keep the bladder empty.  By  08/31/02, the patient was improved to the extent that she was  able to be  discharged to home in satisfactory condition.   DISPOSITION:  The patient was to follow up with her endocrinologist for  management of diabetes.  Also advised to have sexually transmitted disease  check prior to resuming intercourse.      Elwyn Lade . Hancock, N.P.                Katy Fitch, M.D.    MKH/MEDQ  D:  12/10/2002  T:  12/10/2002  Job:  [id number]

## 2011-04-30 NOTE — Op Note (Signed)
   Virginia [name], Virginia [name] NO.:  [id number]   MEDICAL RECORD NO.:  [id number]                   PATIENT TYPE:  INP   LOCATION:  9124                                 [facility]ACILITY:  [facility]   PHYSICIAN:  Timothy P. Fontaine, M.D.           DATE OF BIRTH:  09-26-1976   DATE OF PROCEDURE:  05/04/2003  DATE OF DISCHARGE:  05/05/2003                                 OPERATIVE REPORT   PREOPERATIVE DIAGNOSIS:  Wound hematoma.   POSTOPERATIVE DIAGNOSIS:  Wound hematoma.   PROCEDURE:  Open drainage and evacuation of wound hematoma.   SURGEON:  Timothy P. Fontaine, M.D.   ANESTHESIA:  Spinal.   ESTIMATED BLOOD LOSS:  Minimal.   COMPLICATIONS:  None.   SPECIMENS:  None.   FINDINGS:  Old dark blood and clots along the entire subcutaneous fat layer  consistent with wound hematoma.  The fascial lining is intact noting good  granulation layer overlying the fascia.   DESCRIPTION OF PROCEDURE:  The patient was taken to the operating room and  underwent spinal anesthesia.  She was placed in the supine position and  underwent an abdominal preparation with Betadine solution and was draped in  the usual fashion.  The small left lateral incisional defect that was  draining was probed and easily opened and with my finger I opened the entire  incision without difficulty.  There was a fair amount of clot and old blood  in the subcutaneous layer, and this was evacuated with the suction and  subsequently copiously irrigated.  A sponge was used to remove any old clot  and necrotic appearing areas.  After the copious irrigation and sponge  cleansing, there was a good layer of healthy subcutaneous tissue with some  oozing.  Again probing of the fascial line and the lateral aspects of the  incisions, there were no defects felt.  Electrocautery was used to address  larger bleeders.  Kerlix gauze was then packed into the incision, and a  sterile dressing applied.  The patient was  taken to the recovery room in  good condition having tolerated the procedure well.                                               Timothy P. Audie Box, M.D.    TPF/MEDQ  D:  05/04/2003  T:  05/05/2003  Job:  [id number]

## 2011-04-30 NOTE — Op Note (Signed)
   NAME:  Virginia [name], Virginia [name] NO.:  [id number]   MEDICAL RECORD NO.:  [id number]                   PATIENT TYPE:  INP   LOCATION:  9123                                 [facility]ACILITY[facility]   PHYSICIAN:  Timothy P. Fontaine, M.D.           DATE OF BIRTH:  24-Dec-1975   DATE OF PROCEDURE:  04/22/2003  DATE OF DISCHARGE:                                 OPERATIVE REPORT   DESCRIPTION OF PROCEDURE:  After counseling and signing of consents, the  patient presents for amniocentesis at 36 weeks due to class B diabetes, for  planned induction and lung maturity check.  Initial ultrasound shows a low  normal AFI at 9.8 with a single accessible pocket in the right fundal  region.  It is transplacental with a thin area of placenta in this area.  After initial ultrasound and marking, Doppler study to assure cord not in  this pocket, the area was cleansed with Betadine and subsequently a 22-gauge  amniocentesis needle was used in a single stick to enter the pocket.  Upon  entry to the pocket, the fetus moved and its lower extremities obstructed  the pocket.  Several attempts at aspiration were unsuccessful and after  waiting several minutes for the fetus to move out of the pocket, it became  apparent that the fetus would not do so, and the needle was subsequently  removed.  On serial scanning the fetus remained in this pocket, obstructing  any possible areas for repeat amniocentesis, and there were no other  obtainable pockets.  I discussed the situation with the patient and her  husband, and she will be sent for NST screening following the procedure and  assuming reassuring, then will follow up in the office later today for re-  NST and discussion for the plan, to include expectant management with  antepartum testing and postponement of her induction for a week versus  proceeding with induction now regardless.                                               Timothy P. Audie Box,  M.D.    TPF/MEDQ  D:  04/22/2003  T:  04/23/2003  Job:  [id number]

## 2011-05-05 ENCOUNTER — Emergency Department (HOSPITAL_BASED_OUTPATIENT_CLINIC_OR_DEPARTMENT_OTHER)
Admission: EM | Admit: 2011-05-05 | Discharge: 2011-05-05 | Disposition: A | Discharge status: Home / Self Care | Payer: BC Managed Care – PPO | Attending: Emergency Medicine | Admitting: Emergency Medicine

## 2011-05-05 DIAGNOSIS — G8929 Other chronic pain: Secondary | ICD-10-CM | POA: Insufficient documentation

## 2011-05-05 DIAGNOSIS — F17200 Nicotine dependence, unspecified, uncomplicated: Secondary | ICD-10-CM | POA: Insufficient documentation

## 2011-05-05 DIAGNOSIS — F319 Bipolar disorder, unspecified: Secondary | ICD-10-CM | POA: Insufficient documentation

## 2011-05-05 DIAGNOSIS — Z79899 Other long term (current) drug therapy: Secondary | ICD-10-CM | POA: Insufficient documentation

## 2011-05-05 DIAGNOSIS — E119 Type 2 diabetes mellitus without complications: Secondary | ICD-10-CM | POA: Insufficient documentation

## 2011-05-05 DIAGNOSIS — I10 Essential (primary) hypertension: Secondary | ICD-10-CM | POA: Insufficient documentation

## 2011-05-30 ENCOUNTER — Emergency Department (HOSPITAL_BASED_OUTPATIENT_CLINIC_OR_DEPARTMENT_OTHER)
Admission: EM | Admit: 2011-05-30 | Discharge: 2011-05-30 | Disposition: A | Discharge status: Home / Self Care | Payer: BC Managed Care – PPO | Attending: Emergency Medicine | Admitting: Emergency Medicine

## 2011-05-30 ENCOUNTER — Emergency Department (INDEPENDENT_AMBULATORY_CARE_PROVIDER_SITE_OTHER): Payer: BC Managed Care – PPO

## 2011-05-30 DIAGNOSIS — M1710 Unilateral primary osteoarthritis, unspecified knee: Secondary | ICD-10-CM

## 2011-05-30 DIAGNOSIS — W010XXA Fall on same level from slipping, tripping and stumbling without subsequent striking against object, initial encounter: Secondary | ICD-10-CM | POA: Insufficient documentation

## 2011-05-30 DIAGNOSIS — S8990XA Unspecified injury of unspecified lower leg, initial encounter: Secondary | ICD-10-CM

## 2011-05-30 DIAGNOSIS — W19XXXA Unspecified fall, initial encounter: Secondary | ICD-10-CM

## 2011-05-30 DIAGNOSIS — M25569 Pain in unspecified knee: Secondary | ICD-10-CM | POA: Insufficient documentation

## 2011-05-30 DIAGNOSIS — S99929A Unspecified injury of unspecified foot, initial encounter: Secondary | ICD-10-CM

## 2011-05-30 DIAGNOSIS — F319 Bipolar disorder, unspecified: Secondary | ICD-10-CM | POA: Insufficient documentation

## 2011-05-30 DIAGNOSIS — I10 Essential (primary) hypertension: Secondary | ICD-10-CM | POA: Insufficient documentation

## 2011-05-30 DIAGNOSIS — E119 Type 2 diabetes mellitus without complications: Secondary | ICD-10-CM | POA: Insufficient documentation

## 2011-06-14 ENCOUNTER — Encounter: Payer: BC Managed Care – PPO | Admitting: Physical Medicine and Rehabilitation

## 2011-09-09 LAB — URINALYSIS, ROUTINE W REFLEX MICROSCOPIC
Nitrite: NEGATIVE
Specific Gravity, Urine: 1.027
Urobilinogen, UA: 2 — ABNORMAL HIGH
pH: 5.5

## 2011-09-09 LAB — COMPREHENSIVE METABOLIC PANEL
ALT: 121 — ABNORMAL HIGH
BUN: 6
CO2: 27
Calcium: 8.4
Creatinine, Ser: 0.71
GFR calc non Af Amer: >60
Glucose, Bld: 121 — ABNORMAL HIGH

## 2011-09-09 LAB — POCT I-STAT, CHEM 8
Chloride: 106
Creatinine, Ser: 0.8
HCT: 37
Hemoglobin: 12.6
Potassium: 4.4
Sodium: 138

## 2011-09-09 LAB — URINE MICROSCOPIC-ADD ON

## 2011-09-09 LAB — CBC
Hemoglobin: 12.3
MCHC: 33.7
Platelets: 206
RDW: 13.4

## 2011-09-09 LAB — POCT PREGNANCY, URINE
Operator id: 277751
Preg Test, Ur: NEGATIVE

## 2011-09-09 LAB — HEPATIC FUNCTION PANEL
AST: 473 — ABNORMAL HIGH
Bilirubin, Direct: 0.4 — ABNORMAL HIGH
Indirect Bilirubin: 0.6
Total Protein: 6.3

## 2011-09-09 LAB — DIFFERENTIAL
Basophils Absolute: 0
Basophils Relative: 0
Lymphocytes Relative: 20
Monocytes Absolute: 0.7
Neutro Abs: 8.4 — ABNORMAL HIGH

## 2011-09-10 LAB — URINE MICROSCOPIC-ADD ON

## 2011-09-10 LAB — URINE CULTURE

## 2011-09-10 LAB — URINALYSIS, ROUTINE W REFLEX MICROSCOPIC
Hgb urine dipstick: NEGATIVE
Specific Gravity, Urine: 1.026
Urobilinogen, UA: 1

## 2011-09-17 LAB — URINE CULTURE: Colony Count: >=100000

## 2011-09-17 LAB — URINALYSIS, ROUTINE W REFLEX MICROSCOPIC
Bilirubin Urine: NEGATIVE
Glucose, UA: >1000 mg/dL — AB
Ketones, ur: NEGATIVE mg/dL
Protein, ur: 30 mg/dL — AB

## 2011-09-17 LAB — URINE MICROSCOPIC-ADD ON

## 2011-09-17 LAB — POCT I-STAT, CHEM 8
Chloride: 107 mEq/L (ref 96–112)
Creatinine, Ser: 1.1 mg/dL (ref 0.4–1.2)
Glucose, Bld: 297 mg/dL — ABNORMAL HIGH (ref 70–99)
HCT: 42 % (ref 36.0–46.0)
Potassium: 4.2 mEq/L (ref 3.5–5.1)

## 2011-09-17 LAB — CBC
Hemoglobin: 13.1 g/dL (ref 12.0–15.0)
RBC: 4.57 MIL/uL (ref 3.87–5.11)
RDW: 13.9 % (ref 11.5–15.5)

## 2012-05-05 ENCOUNTER — Encounter (HOSPITAL_BASED_OUTPATIENT_CLINIC_OR_DEPARTMENT_OTHER): Payer: Self-pay | Admitting: *Deleted

## 2012-05-05 ENCOUNTER — Emergency Department (HOSPITAL_BASED_OUTPATIENT_CLINIC_OR_DEPARTMENT_OTHER)
Admission: EM | Admit: 2012-05-05 | Discharge: 2012-05-06 | Disposition: A | Discharge status: Home / Self Care | Payer: BC Managed Care – PPO | Attending: Emergency Medicine | Admitting: Emergency Medicine

## 2012-05-05 DIAGNOSIS — X12XXXA Contact with other hot fluids, initial encounter: Secondary | ICD-10-CM | POA: Insufficient documentation

## 2012-05-05 DIAGNOSIS — Z794 Long term (current) use of insulin: Secondary | ICD-10-CM | POA: Insufficient documentation

## 2012-05-05 DIAGNOSIS — Z79899 Other long term (current) drug therapy: Secondary | ICD-10-CM | POA: Insufficient documentation

## 2012-05-05 DIAGNOSIS — I10 Essential (primary) hypertension: Secondary | ICD-10-CM | POA: Insufficient documentation

## 2012-05-05 DIAGNOSIS — IMO0001 Reserved for inherently not codable concepts without codable children: Secondary | ICD-10-CM | POA: Insufficient documentation

## 2012-05-05 DIAGNOSIS — T2102XA Burn of unspecified degree of abdominal wall, initial encounter: Secondary | ICD-10-CM | POA: Insufficient documentation

## 2012-05-05 DIAGNOSIS — M129 Arthropathy, unspecified: Secondary | ICD-10-CM | POA: Insufficient documentation

## 2012-05-05 DIAGNOSIS — Z9884 Bariatric surgery status: Secondary | ICD-10-CM | POA: Insufficient documentation

## 2012-05-05 DIAGNOSIS — F17200 Nicotine dependence, unspecified, uncomplicated: Secondary | ICD-10-CM | POA: Insufficient documentation

## 2012-05-05 DIAGNOSIS — X131XXA Other contact with steam and other hot vapors, initial encounter: Secondary | ICD-10-CM | POA: Insufficient documentation

## 2012-05-05 DIAGNOSIS — E119 Type 2 diabetes mellitus without complications: Secondary | ICD-10-CM | POA: Insufficient documentation

## 2012-05-05 DIAGNOSIS — N390 Urinary tract infection, site not specified: Secondary | ICD-10-CM

## 2012-05-05 DIAGNOSIS — T310 Burns involving less than 10% of body surface: Secondary | ICD-10-CM | POA: Insufficient documentation

## 2012-05-05 HISTORY — DX: Unspecified osteoarthritis, unspecified site: M19.90

## 2012-05-05 HISTORY — DX: Essential (primary) hypertension: I10

## 2012-05-05 LAB — URINALYSIS, ROUTINE W REFLEX MICROSCOPIC
Glucose, UA: >1000 mg/dL — AB
Leukocytes, UA: NEGATIVE
Nitrite: POSITIVE — AB
Protein, ur: NEGATIVE mg/dL

## 2012-05-05 LAB — PREGNANCY, URINE: Preg Test, Ur: NEGATIVE

## 2012-05-05 LAB — URINE MICROSCOPIC-ADD ON

## 2012-05-05 MED ORDER — SILVER SULFADIAZINE 1 % EX CREA
TOPICAL_CREAM | Freq: Two times a day (BID) | CUTANEOUS | Status: DC
  Administered 2012-05-05: 1 via TOPICAL
  Filled 2012-05-05: qty 85

## 2012-05-05 MED ORDER — SILVER SULFADIAZINE 1 % EX CREA: TOPICAL_CREAM | Freq: Every day | CUTANEOUS | Status: DC

## 2012-05-05 MED ORDER — FLUCONAZOLE 100 MG PO TABS
200.0000 mg | ORAL_TABLET | Freq: Once | ORAL | Status: AC
  Administered 2012-05-05: 200 mg via ORAL
  Filled 2012-05-05: qty 2

## 2012-05-05 MED ORDER — CIPROFLOXACIN HCL 500 MG PO TABS: 500.0000 mg | ORAL_TABLET | Freq: Two times a day (BID) | ORAL | Status: AC

## 2012-05-05 MED ORDER — OXYCODONE-ACETAMINOPHEN 5-325 MG PO TABS: 2.0000 | ORAL_TABLET | ORAL | Status: DC | PRN

## 2012-05-05 MED ORDER — OXYCODONE-ACETAMINOPHEN 5-325 MG PO TABS
2.0000 | ORAL_TABLET | Freq: Once | ORAL | Status: AC
  Administered 2012-05-05: 2 via ORAL
  Filled 2012-05-05: qty 2

## 2012-05-05 MED ORDER — CIPROFLOXACIN HCL 500 MG PO TABS
500.0000 mg | ORAL_TABLET | Freq: Once | ORAL | Status: AC
  Administered 2012-05-05: 500 mg via ORAL
  Filled 2012-05-05: qty 1
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
## 2012-05-05 NOTE — Discharge Instructions (Signed)
Urinary Tract Infection Infections of the urinary tract can start in several places. A bladder infection (cystitis), a kidney infection (pyelonephritis), and a prostate infection (prostatitis) are different types of urinary tract infections (UTIs). They usually get better if treated with medicines (antibiotics) that kill germs. Take all the medicine until it is gone. You or your child may feel better in a few days, but TAKE ALL MEDICINE or the infection may not respond and may become more difficult to treat. HOME CARE INSTRUCTIONS   Drink enough water and fluids to keep the urine clear or pale yellow. Cranberry juice is especially recommended, in addition to large amounts of water.   Avoid caffeine, tea, and carbonated beverages. They tend to irritate the bladder.   Alcohol may irritate the prostate.   Only take over-the-counter or prescription medicines for pain, discomfort, or fever as directed by your caregiver.  To prevent further infections:  Empty the bladder often. Avoid holding urine for long periods of time.   After a bowel movement, women should cleanse from front to back. Use each tissue only once.   Empty the bladder before and after sexual intercourse.  FINDING OUT THE RESULTS OF YOUR TEST Not all test results are available during your visit. If your or your child's test results are not back during the visit, make an appointment with your caregiver to find out the results. Do not assume everything is normal if you have not heard from your caregiver or the medical facility. It is important for you to follow up on all test results. SEEK MEDICAL CARE IF:   There is back pain.   Your baby is older than 3 months with a rectal temperature of 100.5 F (38.1 C) or higher for more than 1 day.   Your or your child's problems (symptoms) are no better in 3 days. Return sooner if you or your child is getting worse.  SEEK IMMEDIATE MEDICAL CARE IF:   There is severe back pain or lower  abdominal pain.   You or your child develops chills.   You have a fever.   Your baby is older than 3 months with a rectal temperature of 102 F (38.9 C) or higher.   Your baby is 3 months old or younger with a rectal temperature of 100.4 F (38 C) or higher.   There is nausea or vomiting.   There is continued burning or discomfort with urination.  MAKE SURE YOU:   Understand these instructions.   Will watch your condition.   Will get help right away if you are not doing well or get worse.  Document Released: 09/08/2005 Document Revised: 11/18/2011 Document Reviewed: 04/13/2007 Exit[employer] Patient Information 2012 [employer].Urinary Tract Infection Infections of the urinary tract can start in several places. A bladder infection (cystitis), a kidney infection (pyelonephritis), and a prostate infection (prostatitis) are different types of urinary tract infections (UTIs). They usually get better if treated with medicines (antibiotics) that kill germs. Take all the medicine until it is gone. You or your child may feel better in a few days, but TAKE ALL MEDICINE or the infection may not respond and may become more difficult to treat. HOME CARE INSTRUCTIONS   Drink enough water and fluids to keep the urine clear or pale yellow. Cranberry juice is especially recommended, in addition to large amounts of water.   Avoid caffeine, tea, and carbonated beverages. They tend to irritate the bladder.   Alcohol may irritate the prostate.   Only take   over-the-counter or prescription medicines for pain, discomfort, or fever as directed by your caregiver.  To prevent further infections:  Empty the bladder often. Avoid holding urine for long periods of time.   After a bowel movement, women should cleanse from front to back. Use each tissue only once.   Empty the bladder before and after sexual intercourse.  FINDING OUT THE RESULTS OF YOUR TEST Not all test results are available during your  visit. If your or your child's test results are not back during the visit, make an appointment with your caregiver to find out the results. Do not assume everything is normal if you have not heard from your caregiver or the medical facility. It is important for you to follow up on all test results. SEEK MEDICAL CARE IF:   There is back pain.   Your baby is older than 3 months with a rectal temperature of 100.5 F (38.1 C) or higher for more than 1 day.   Your or your child's problems (symptoms) are no better in 3 days. Return sooner if you or your child is getting worse.  SEEK IMMEDIATE MEDICAL CARE IF:   There is severe back pain or lower abdominal pain.   You or your child develops chills.   You have a fever.   Your baby is older than 3 months with a rectal temperature of 102 F (38.9 C) or higher.   Your baby is 3 months old or younger with a rectal temperature of 100.4 F (38 C) or higher.   There is nausea or vomiting.   There is continued burning or discomfort with urination.  MAKE SURE YOU:   Understand these instructions.   Will watch your condition.   Will get help right away if you are not doing well or get worse.  Document Released: 09/08/2005 Document Revised: 11/18/2011 Document Reviewed: 04/13/2007 Exit[employer] Patient Information 2012 [employer]. 

## 2012-06-12 ENCOUNTER — Ambulatory Visit: Payer: Self-pay | Admitting: Oncology

## 2012-07-06 ENCOUNTER — Emergency Department (HOSPITAL_COMMUNITY)
Admission: EM | Admit: 2012-07-06 | Discharge: 2012-07-06 | Disposition: A | Discharge status: Home / Self Care | Payer: Self-pay | Attending: Emergency Medicine | Admitting: Emergency Medicine

## 2012-07-06 ENCOUNTER — Encounter (HOSPITAL_COMMUNITY): Payer: Self-pay

## 2012-07-06 DIAGNOSIS — Z79899 Other long term (current) drug therapy: Secondary | ICD-10-CM | POA: Insufficient documentation

## 2012-07-06 DIAGNOSIS — F319 Bipolar disorder, unspecified: Secondary | ICD-10-CM | POA: Insufficient documentation

## 2012-07-06 DIAGNOSIS — R4589 Other symptoms and signs involving emotional state: Secondary | ICD-10-CM

## 2012-07-06 DIAGNOSIS — R4182 Altered mental status, unspecified: Secondary | ICD-10-CM | POA: Insufficient documentation

## 2012-07-06 DIAGNOSIS — Z794 Long term (current) use of insulin: Secondary | ICD-10-CM | POA: Insufficient documentation

## 2012-07-06 DIAGNOSIS — F17200 Nicotine dependence, unspecified, uncomplicated: Secondary | ICD-10-CM | POA: Insufficient documentation

## 2012-07-06 DIAGNOSIS — I10 Essential (primary) hypertension: Secondary | ICD-10-CM | POA: Insufficient documentation

## 2012-07-06 DIAGNOSIS — IMO0001 Reserved for inherently not codable concepts without codable children: Secondary | ICD-10-CM | POA: Insufficient documentation

## 2012-07-06 DIAGNOSIS — M129 Arthropathy, unspecified: Secondary | ICD-10-CM | POA: Insufficient documentation

## 2012-07-06 DIAGNOSIS — Z9884 Bariatric surgery status: Secondary | ICD-10-CM | POA: Insufficient documentation

## 2012-07-06 DIAGNOSIS — E119 Type 2 diabetes mellitus without complications: Secondary | ICD-10-CM | POA: Insufficient documentation

## 2012-07-06 DIAGNOSIS — R531 Weakness: Secondary | ICD-10-CM

## 2012-07-06 HISTORY — DX: Bipolar disorder, unspecified: F31.9

## 2012-07-06 LAB — CBC
HCT: 36.2 % (ref 36.0–46.0)
Hemoglobin: 11.5 g/dL — ABNORMAL LOW (ref 12.0–15.0)
MCV: 78.9 fL (ref 78.0–100.0)
RBC: 4.59 MIL/uL (ref 3.87–5.11)
WBC: 10.9 10*3/uL — ABNORMAL HIGH (ref 4.0–10.5)

## 2012-07-06 LAB — URINALYSIS, ROUTINE W REFLEX MICROSCOPIC
Ketones, ur: NEGATIVE mg/dL
Leukocytes, UA: NEGATIVE
Nitrite: NEGATIVE
Protein, ur: NEGATIVE mg/dL
Urobilinogen, UA: 0.2 mg/dL (ref 0.0–1.0)
pH: 6 (ref 5.0–8.0)

## 2012-07-06 LAB — COMPREHENSIVE METABOLIC PANEL
AST: 9 U/L (ref 0–37)
Alkaline Phosphatase: 116 U/L (ref 39–117)
BUN: 13 mg/dL (ref 6–23)
CO2: 23 mEq/L (ref 19–32)
Chloride: 103 mEq/L (ref 96–112)
Creatinine, Ser: 0.73 mg/dL (ref 0.50–1.10)
GFR calc non Af Amer: >90 mL/min (ref >90)
Potassium: 4.4 mEq/L (ref 3.5–5.1)
Total Bilirubin: 0.3 mg/dL (ref 0.3–1.2)

## 2012-07-06 LAB — RAPID URINE DRUG SCREEN, HOSP PERFORMED: Barbiturates: NOT DETECTED

## 2012-07-06 LAB — POCT PREGNANCY, URINE: Preg Test, Ur: NEGATIVE

## 2012-07-06 NOTE — ED Notes (Signed)
Pt unable to urinate at this time.  

## 2012-07-06 NOTE — ED Notes (Signed)
Per EMS, pt went to bed angry and woke up later on the floor. Not sure how she got there but crawled back in bed. Pupils are dilated, equal, and reactive to light. Son told EMS he was unable to wake mom up the second time. Pt is groggy. CBG 287, HR SB on monitor with rate in 50s, BP 154/80, 20g LH.

## 2012-07-06 NOTE — ED Provider Notes (Signed)
History     CSN: [id number]  Arrival date & time 07/06/12  1444   First MD Initiated Contact with Patient 07/06/12 1504      Chief Complaint  Patient presents with  . Altered Mental Status    (Consider location/radiation/quality/duration/timing/severity/associated sxs/prior treatment) Patient is a 36 y.o. female presenting with altered mental status. The history is provided by the patient.  Altered Mental Status This is a new problem. The current episode started today. The problem occurs constantly. The problem has been gradually improving. Associated symptoms include weakness. Pertinent negatives include no abdominal pain, chest pain, chills, congestion, coughing, diaphoresis, fatigue, fever, headaches, nausea, neck pain, numbness, rash, sore throat or vomiting. The symptoms are aggravated by stress. She has tried rest for the symptoms. The treatment provided mild relief.    Past Medical History  Diagnosis Date  . Diabetes mellitus   . Hypertension   . Fibromyalgia   . Arthritis   . Bipolar affective     Past Surgical History  Procedure Date  . Cesarean section   . Cholecystectomy   . Gastric bypass   . Knee surgery     No family history on file.  History  Substance Use Topics  . Smoking status: Current Everyday Smoker -- 0.5 packs/day  . Smokeless tobacco: Not on file  . Alcohol Use: No    OB History    Grav Para Term Preterm Abortions TAB SAB Ect Mult Living                  Review of Systems  Constitutional: Negative for fever, chills, diaphoresis and fatigue.  HENT: Negative for ear pain, congestion, sore throat, facial swelling, mouth sores, trouble swallowing, neck pain and neck stiffness.   Eyes: Negative.   Respiratory: Negative for apnea, cough, chest tightness, shortness of breath and wheezing.   Cardiovascular: Negative for chest pain, palpitations and leg swelling.  Gastrointestinal: Negative for nausea, vomiting, abdominal pain, diarrhea and  abdominal distention.  Genitourinary: Negative for hematuria, flank pain, vaginal discharge, difficulty urinating and menstrual problem.  Musculoskeletal: Negative for back pain and gait problem.  Skin: Negative for rash and wound.  Neurological: Positive for weakness. Negative for dizziness, tremors, seizures, syncope, facial asymmetry, numbness and headaches.  Psychiatric/Behavioral: Positive for confusion, dysphoric mood and altered mental status. Negative for suicidal ideas, behavioral problems and self-injury. The patient is nervous/anxious.   All other systems reviewed and are negative.    Allergies  Ace inhibitors  Home Medications   Current Outpatient Rx  Name Route Sig Dispense Refill  . ALPRAZOLAM 1 MG PO TABS Oral Take 1 mg by mouth 4 (four) times daily - after meals and at bedtime.    . AMPHETAMINE-DEXTROAMPHETAMINE 20 MG PO TABS Oral Take 40 mg by mouth daily.    . DULOXETINE HCL 60 MG PO CPEP Oral Take 60 mg by mouth daily.    . INSULIN ASPART 100 UNIT/ML [place] SOLN Subcutaneous Inject 4-5 Units into the skin 3 (three) times daily before meals.    . INSULIN DETEMIR 100 UNIT/ML [place] SOLN Subcutaneous Inject 22-27 Units into the skin at bedtime.    Marland Kitchen LAMOTRIGINE 100 MG PO TABS Oral Take 100 mg by mouth daily.    Marland Kitchen MICONAZOLE NITRATE VA KIT Vaginal Place 1 each vaginally at bedtime.    . OXYCODONE HCL ER 15 MG PO TB12 Oral Take 15 mg by mouth every 12 (twelve) hours. Patient uses this medication for pain.    Marland Kitchen AZO  TABS PO Oral Take 1 tablet by mouth daily as needed. Patient used AZO Yeast.    . PREGABALIN 25 MG PO CAPS Oral Take 25 mg by mouth 2 (two) times daily.    Marland Kitchen SILVER SULFADIAZINE 1 % EX CREA Topical Apply topically daily. 50 g 0  . VITAMIN B-12 1000 MCG PO TABS Oral Take 1,000 mcg by mouth daily.    Marland Kitchen VITAMIN C 500 MG PO TABS Oral Take 500 mg by mouth daily.      BP 146/80  Pulse 64  Temp 98.1 F (36.7 C) (Oral)  Resp 16  SpO2 100%  Physical Exam  Nursing note  and vitals reviewed. Constitutional: She is oriented to person, place, and time. She appears well-developed and well-nourished. No distress.  HENT:  Head: Normocephalic and atraumatic.  Right Ear: External ear normal.  Left Ear: External ear normal.  Nose: Nose normal.  Mouth/Throat: Oropharynx is clear and moist. No oropharyngeal exudate.  Eyes: Conjunctivae and EOM are normal. Pupils are equal, round, and reactive to light. Right eye exhibits no discharge. Left eye exhibits no discharge.  Neck: Normal range of motion. Neck supple. No JVD present. No tracheal deviation present. No thyromegaly present.  Cardiovascular: Normal rate, regular rhythm, normal heart sounds and intact distal pulses.  Exam reveals no gallop and no friction rub.   No murmur heard. Pulmonary/Chest: Effort normal and breath sounds normal. No respiratory distress. She has no wheezes. She has no rales. She exhibits no tenderness.  Abdominal: Soft. Bowel sounds are normal. She exhibits no distension. There is no tenderness. There is no rebound and no guarding.  Musculoskeletal: Normal range of motion.  Lymphadenopathy:    She has no cervical adenopathy.  Neurological: She is alert and oriented to person, place, and time. She has normal strength. No cranial nerve deficit or sensory deficit. Coordination normal. GCS eye subscore is 4. GCS verbal subscore is 5. GCS motor subscore is 6.       Patient with no ataxia to arm or leg movements. Patient with no truncal ataxia no nystagmus. Normal cranial nerve exam motor function sensory exam.  Skin: Skin is warm. No rash noted. She is not diaphoretic.  Psychiatric: Her affect is blunt. Her speech is delayed. She is slowed. She expresses no homicidal and no suicidal ideation.    ED Course  Procedures (including critical care time)   Labs Reviewed  URINALYSIS, ROUTINE W REFLEX MICROSCOPIC  URINE RAPID DRUG SCREEN (HOSP PERFORMED)  CBC  COMPREHENSIVE METABOLIC PANEL   No  results found.   No diagnosis found.    MDM  36 year old female patient with past medical history depression anxiety fibromyalgia who takes multiple psychiatric medicines including Xanax Lyrica Cymbalta Adderall presents with an episode of weakness and confusion. Patient says she got into an argument has been having increased stress and anxiety recently when she took a nap today and her son came to wake her up and she "was not behaving normally." According to the neighbor at bedside patient was not waking up and seemed confused. Patient does not really recall episode. Patient says she's not had any fevers nausea vomiting headache neck pain abdominal pain chest pain. Patient says she has chronic pain associated with fibromyalgia. Patient says she did not take any more of her medication she normally does does not have suicidal ideations or homicidal ideations. Here patient with normal neurological exam. Patient complains of global weakness but with distraction has 5 out of 5 strength in all  extremities with no ataxia and normal neurological exam as above. Patient with no aphasia no visual deficits no ataxic features no sensory deficits no motor deficits. Patient with nonfocal neurological exam, this likely relates to stress anxiety depression. This could also be related to side effects of multiple medications causing mental slowing.   Date: 07/06/2012  Rate: 71  Rhythm: normal sinus rhythm  QRS Axis: normal  Intervals: normal  ST/T Wave abnormalities: normal  Conduction Disutrbances: none  Narrative Interpretation:   Old EKG Reviewed: No significant changes noted  Results for orders placed during the hospital encounter of 07/06/12  URINALYSIS, ROUTINE W REFLEX MICROSCOPIC      Component Value Range   Color, Urine YELLOW  YELLOW   APPearance CLEAR  CLEAR   Specific Gravity, Urine 1.008  1.005 - 1.030   pH 6.0  5.0 - 8.0   Glucose, UA NEGATIVE  NEGATIVE mg/dL   Hgb urine dipstick NEGATIVE   NEGATIVE   Bilirubin Urine NEGATIVE  NEGATIVE   Ketones, ur NEGATIVE  NEGATIVE mg/dL   Protein, ur NEGATIVE  NEGATIVE mg/dL   Urobilinogen, UA 0.2  0.0 - 1.0 mg/dL   Nitrite NEGATIVE  NEGATIVE   Leukocytes, UA NEGATIVE  NEGATIVE  URINE RAPID DRUG SCREEN (HOSP PERFORMED)      Component Value Range   Opiates NONE DETECTED  NONE DETECTED   Cocaine NONE DETECTED  NONE DETECTED   Benzodiazepines NONE DETECTED  NONE DETECTED   Amphetamines NONE DETECTED  NONE DETECTED   Tetrahydrocannabinol NONE DETECTED  NONE DETECTED   Barbiturates NONE DETECTED  NONE DETECTED  CBC      Component Value Range   WBC 10.9 (*) 4.0 - 10.5 K/uL   RBC 4.59  3.87 - 5.11 MIL/uL   Hemoglobin 11.5 (*) 12.0 - 15.0 g/dL   HCT 54.0  98.1 - 19.1 %   MCV 78.9  78.0 - 100.0 fL   MCH 25.1 (*) 26.0 - 34.0 pg   MCHC 31.8  30.0 - 36.0 g/dL   RDW 47.8 (*) 29.5 - 62.1 %   Platelets 267  150 - 400 K/uL  COMPREHENSIVE METABOLIC PANEL      Component Value Range   Sodium 140  135 - 145 mEq/L   Potassium 4.4  3.5 - 5.1 mEq/L   Chloride 103  96 - 112 mEq/L   CO2 23  19 - 32 mEq/L   Glucose, Bld 273 (*) 70 - 99 mg/dL   BUN 13  6 - 23 mg/dL   Creatinine, Ser 3.08  0.50 - 1.10 mg/dL   Calcium 9.3  8.4 - 65.7 mg/dL   Total Protein 7.5  6.0 - 8.3 g/dL   Albumin 3.3 (*) 3.5 - 5.2 g/dL   AST 9  0 - 37 U/L   ALT 7  0 - 35 U/L   Alkaline Phosphatase 116  39 - 117 U/L   Total Bilirubin 0.3  0.3 - 1.2 mg/dL   GFR calc non Af Amer >90  >90 mL/min   GFR calc Af Amer >90  >90 mL/min  POCT PREGNANCY, URINE      Component Value Range   Preg Test, Ur NEGATIVE  NEGATIVE     Labs grossly normal except for hyperglycemia consistent with her diabetes. Patient now walking emergency department says that she feels better and thinks this episode this is attributable to depression. Patient encouraged followup with her primary care physician for better management of her depressive symptoms.   Case  discussed with Dr.  Mora Bellman, MD 07/06/12 [phone]

## 2012-07-06 NOTE — ED Provider Notes (Signed)
I reviewed the resident's note and I agree with the findings and plan.    Nelia Shi, MD 07/06/12 [phone]

## 2012-07-09 ENCOUNTER — Inpatient Hospital Stay: Payer: Self-pay | Admitting: Internal Medicine

## 2012-07-09 LAB — IRON AND TIBC
Iron Bind.Cap.(Total): 203 ug/dL — ABNORMAL LOW (ref 250–450)
Unbound Iron-Bind.Cap.: 175 ug/dL

## 2012-07-09 LAB — COMPREHENSIVE METABOLIC PANEL
Albumin: 1.8 g/dL — ABNORMAL LOW (ref 3.4–5.0)
Albumin: 2.7 g/dL — ABNORMAL LOW (ref 3.4–5.0)
Alkaline Phosphatase: 108 U/L (ref 50–136)
Anion Gap: 12 (ref 7–16)
Anion Gap: 9 (ref 7–16)
Calcium, Total: <5 mg/dL — CL (ref 8.5–10.1)
Calcium, Total: 7.4 mg/dL — ABNORMAL LOW (ref 8.5–10.1)
Chloride: 120 mmol/L — ABNORMAL HIGH (ref 98–107)
Co2: 16 mmol/L — ABNORMAL LOW (ref 21–32)
Co2: 24 mmol/L (ref 21–32)
EGFR (African American): 31 — ABNORMAL LOW
EGFR (Non-African Amer.): 27 — ABNORMAL LOW
EGFR (Non-African Amer.): 22 — ABNORMAL LOW
Glucose: 214 mg/dL — ABNORMAL HIGH (ref 65–99)
Glucose: 249 mg/dL — ABNORMAL HIGH (ref 65–99)
Osmolality: 305 (ref 275–301)
Osmolality: 298 (ref 275–301)
Potassium: 2.5 mmol/L — CL (ref 3.5–5.1)
Potassium: 3.8 mmol/L (ref 3.5–5.1)
SGOT(AST): 10 U/L — ABNORMAL LOW (ref 15–37)
SGOT(AST): 13 U/L — ABNORMAL LOW (ref 15–37)

## 2012-07-09 LAB — CBC
MCH: 25.9 pg — ABNORMAL LOW (ref 26.0–34.0)
MCV: 81 fL (ref 80–100)
Platelet: 157 10*3/uL (ref 150–440)

## 2012-07-09 LAB — URINALYSIS, COMPLETE
Leukocyte Esterase: NEGATIVE
Nitrite: NEGATIVE
Ph: 5 (ref 4.5–8.0)
Protein: NEGATIVE
RBC,UR: 1 /HPF (ref 0–5)
WBC UR: 1 /HPF (ref 0–5)

## 2012-07-10 LAB — CBC WITH DIFFERENTIAL/PLATELET
Basophil #: 0 10*3/uL (ref 0.0–0.1)
Basophil %: 0.3 %
Eosinophil #: 0.2 10*3/uL (ref 0.0–0.7)
HCT: 27.7 % — ABNORMAL LOW (ref 35.0–47.0)
HGB: 8.9 g/dL — ABNORMAL LOW (ref 12.0–16.0)
Lymphocyte #: 4.1 10*3/uL — ABNORMAL HIGH (ref 1.0–3.6)
Lymphocyte %: 42.3 %
MCH: 25.7 pg — ABNORMAL LOW (ref 26.0–34.0)
MCHC: 32.1 g/dL (ref 32.0–36.0)
MCV: 80 fL (ref 80–100)
Monocyte #: 0.6 x10 3/mm (ref 0.2–0.9)
Neutrophil #: 4.7 10*3/uL (ref 1.4–6.5)
RBC: 3.45 10*6/uL — ABNORMAL LOW (ref 3.80–5.20)
RDW: 18.3 % — ABNORMAL HIGH (ref 11.5–14.5)
WBC: 9.7 10*3/uL (ref 3.6–11.0)

## 2012-07-10 LAB — COMPREHENSIVE METABOLIC PANEL
BUN: 27 mg/dL — ABNORMAL HIGH (ref 7–18)
Calcium, Total: 7.6 mg/dL — ABNORMAL LOW (ref 8.5–10.1)
Chloride: 110 mmol/L — ABNORMAL HIGH (ref 98–107)
EGFR (African American): 41 — ABNORMAL LOW
EGFR (Non-African Amer.): 35 — ABNORMAL LOW
SGOT(AST): 15 U/L (ref 15–37)
SGPT (ALT): 11 U/L — ABNORMAL LOW
Total Protein: 6 g/dL — ABNORMAL LOW (ref 6.4–8.2)

## 2012-07-10 LAB — LACTATE DEHYDROGENASE: LDH: 139 U/L (ref 81–234)

## 2012-07-10 LAB — OCCULT BLOOD X 1 CARD TO LAB, STOOL: Occult Blood, Feces: NEGATIVE

## 2012-07-10 LAB — FOLATE: Folic Acid: 15.1 ng/mL (ref 3.1–100.0)

## 2012-07-11 LAB — CBC WITH DIFFERENTIAL/PLATELET
Basophil %: 0.4 %
Eosinophil #: 0.2 10*3/uL (ref 0.0–0.7)
Eosinophil %: 3.3 %
HGB: 8.8 g/dL — ABNORMAL LOW (ref 12.0–16.0)
Lymphocyte #: 2.9 10*3/uL (ref 1.0–3.6)
MCH: 25.9 pg — ABNORMAL LOW (ref 26.0–34.0)
MCHC: 32.3 g/dL (ref 32.0–36.0)
MCV: 80 fL (ref 80–100)
Monocyte #: 0.5 x10 3/mm (ref 0.2–0.9)
Platelet: 184 10*3/uL (ref 150–440)
RBC: 3.42 10*6/uL — ABNORMAL LOW (ref 3.80–5.20)

## 2012-07-11 LAB — BASIC METABOLIC PANEL
Calcium, Total: 7.8 mg/dL — ABNORMAL LOW (ref 8.5–10.1)
Chloride: 111 mmol/L — ABNORMAL HIGH (ref 98–107)
Co2: 24 mmol/L (ref 21–32)
EGFR (Non-African Amer.): >60
Glucose: 111 mg/dL — ABNORMAL HIGH (ref 65–99)
Potassium: 4 mmol/L (ref 3.5–5.1)
Sodium: 144 mmol/L (ref 136–145)

## 2012-07-13 ENCOUNTER — Ambulatory Visit: Payer: Self-pay | Admitting: Oncology

## 2012-10-25 ENCOUNTER — Emergency Department (HOSPITAL_COMMUNITY)
Admission: EM | Admit: 2012-10-25 | Discharge: 2012-10-25 | Disposition: A | Discharge status: Home / Self Care | Payer: Self-pay | Attending: Emergency Medicine | Admitting: Emergency Medicine

## 2012-10-25 ENCOUNTER — Encounter (HOSPITAL_COMMUNITY): Payer: Self-pay | Admitting: *Deleted

## 2012-10-25 DIAGNOSIS — IMO0001 Reserved for inherently not codable concepts without codable children: Secondary | ICD-10-CM | POA: Insufficient documentation

## 2012-10-25 DIAGNOSIS — R112 Nausea with vomiting, unspecified: Secondary | ICD-10-CM | POA: Insufficient documentation

## 2012-10-25 DIAGNOSIS — F319 Bipolar disorder, unspecified: Secondary | ICD-10-CM | POA: Insufficient documentation

## 2012-10-25 DIAGNOSIS — I10 Essential (primary) hypertension: Secondary | ICD-10-CM | POA: Insufficient documentation

## 2012-10-25 DIAGNOSIS — M129 Arthropathy, unspecified: Secondary | ICD-10-CM | POA: Insufficient documentation

## 2012-10-25 DIAGNOSIS — F17200 Nicotine dependence, unspecified, uncomplicated: Secondary | ICD-10-CM | POA: Insufficient documentation

## 2012-10-25 DIAGNOSIS — E119 Type 2 diabetes mellitus without complications: Secondary | ICD-10-CM | POA: Insufficient documentation

## 2012-10-25 DIAGNOSIS — Z79899 Other long term (current) drug therapy: Secondary | ICD-10-CM | POA: Insufficient documentation

## 2012-10-25 DIAGNOSIS — K279 Peptic ulcer, site unspecified, unspecified as acute or chronic, without hemorrhage or perforation: Secondary | ICD-10-CM | POA: Insufficient documentation

## 2012-10-25 DIAGNOSIS — Z794 Long term (current) use of insulin: Secondary | ICD-10-CM | POA: Insufficient documentation

## 2012-10-25 LAB — URINALYSIS, ROUTINE W REFLEX MICROSCOPIC
Leukocytes, UA: NEGATIVE
Nitrite: NEGATIVE
Specific Gravity, Urine: 1.028 (ref 1.005–1.030)
Urobilinogen, UA: 1 mg/dL (ref 0.0–1.0)
pH: 7 (ref 5.0–8.0)

## 2012-10-25 LAB — COMPREHENSIVE METABOLIC PANEL
ALT: 11 U/L (ref 0–35)
CO2: 27 mEq/L (ref 19–32)
Calcium: 8.9 mg/dL (ref 8.4–10.5)
Creatinine, Ser: 0.68 mg/dL (ref 0.50–1.10)
GFR calc Af Amer: >90 mL/min (ref >90)
GFR calc non Af Amer: >90 mL/min (ref >90)
Glucose, Bld: 344 mg/dL — ABNORMAL HIGH (ref 70–99)
Sodium: 135 mEq/L (ref 135–145)
Total Protein: 6.9 g/dL (ref 6.0–8.3)

## 2012-10-25 LAB — POCT PREGNANCY, URINE: Preg Test, Ur: NEGATIVE

## 2012-10-25 LAB — CBC
HCT: 36 % (ref 36.0–46.0)
MCHC: 34.4 g/dL (ref 30.0–36.0)
MCV: 82.2 fL (ref 78.0–100.0)
Platelets: 236 10*3/uL (ref 150–400)
RDW: 13.1 % (ref 11.5–15.5)
WBC: 9.6 10*3/uL (ref 4.0–10.5)

## 2012-10-25 LAB — GLUCOSE, CAPILLARY
Glucose-Capillary: 350 mg/dL — ABNORMAL HIGH (ref 70–99)
Glucose-Capillary: 309 mg/dL — ABNORMAL HIGH (ref 70–99)

## 2012-10-25 MED ORDER — SODIUM CHLORIDE 0.9 % IV BOLUS (SEPSIS)
1000.0000 mL | Freq: Once | INTRAVENOUS | Status: AC
  Administered 2012-10-25: 1000 mL via INTRAVENOUS

## 2012-10-25 MED ORDER — INSULIN ASPART 100 UNIT/ML ~~LOC~~ SOLN
8.0000 [IU] | Freq: Once | SUBCUTANEOUS | Status: AC
  Administered 2012-10-25: 8 [IU] via SUBCUTANEOUS
  Filled 2012-10-25: qty 8

## 2012-10-25 MED ORDER — ONDANSETRON HCL 4 MG PO TABS: 4.0000 mg | ORAL_TABLET | Freq: Four times a day (QID) | ORAL | Status: DC

## 2012-10-25 MED ORDER — HYDROMORPHONE HCL PF 1 MG/ML IJ SOLN
1.0000 mg | Freq: Once | INTRAMUSCULAR | Status: AC
  Administered 2012-10-25: 1 mg via INTRAVENOUS
  Filled 2012-10-25: qty 1

## 2012-10-25 MED ORDER — FAMOTIDINE IN NACL 20-0.9 MG/50ML-% IV SOLN
20.0000 mg | Freq: Once | INTRAVENOUS | Status: AC
  Administered 2012-10-25: 20 mg via INTRAVENOUS
  Filled 2012-10-25: qty 50

## 2012-10-25 MED ORDER — SUCRALFATE 1 GM/10ML PO SUSP: 1.0000 g | Freq: Four times a day (QID) | ORAL | Status: DC

## 2012-10-25 MED ORDER — ONDANSETRON HCL 4 MG/2ML IJ SOLN
4.0000 mg | Freq: Once | INTRAMUSCULAR | Status: AC
  Administered 2012-10-25: 4 mg via INTRAVENOUS
  Filled 2012-10-25: qty 2

## 2012-10-25 MED ORDER — GI COCKTAIL ~~LOC~~
30.0000 mL | Freq: Once | ORAL | Status: AC
  Administered 2012-10-25: 30 mL via ORAL
  Filled 2012-10-25: qty 30

## 2012-10-25 NOTE — ED Notes (Signed)
OZH:[id number]<HQ> Expected date:<BR> Expected time:<BR> Means of arrival:Ambulance<BR> Comments:<BR> vomiting

## 2012-10-25 NOTE — ED Notes (Signed)
Pt. Is not able to use the bathroom at this time, but she is aware that we need a urine specimen.

## 2012-10-25 NOTE — ED Notes (Signed)
Per ems: pt from home, onset of abd pain at 0600 this morning. Pain in "center" of abd, n/v present. Hx of gallbladder removal, gastric bypass 2007. Hx of DM, CBG 326. bp 167/107, pulse 74, saO2 100% ra

## 2012-10-25 NOTE — ED Provider Notes (Signed)
History     CSN: [id number]  Arrival date & time 10/25/12  1304   None     Chief Complaint  Patient presents with  . Abdominal Pain    (Consider location/radiation/quality/duration/timing/severity/associated sxs/prior treatment) Patient is a 36 y.o. female presenting with abdominal pain. The history is provided by the patient. No language interpreter was used.  Abdominal Pain The primary symptoms of the illness include abdominal pain, nausea and vomiting. The primary symptoms of the illness do not include fever, shortness of breath, diarrhea or dysuria. The onset of the illness was sudden. The problem has been gradually worsening.  The patient states that she believes she is currently not pregnant. Symptoms associated with the illness do not include chills, diaphoresis, constipation, urgency or hematuria.  36 yo with c/o epigastric pain since 6am today with nausea and vomiting x 6. pmh of peptic ulser disease with hospitalization in [place] in July 2013. Denies blood in vomitus or stool.   Takes prilosec over the counter for this daily.  States she had spicy foods lately.  Denies, etoh, NSAIDS, SOB, chest pain, dysuria, kidney stone.     pmh diabetes states she did not use her sliding scale last pm prior to bed. But took her Levemir . States she took zantac 150 mg x 4 this am without relief. pmh cholecystectomy and gastic bypass surgery.  Sugar is 326 upon arrival.  pmh listed below.  Past Medical History  Diagnosis Date  . Diabetes mellitus   . Hypertension   . Fibromyalgia   . Arthritis   . Bipolar affective     Past Surgical History  Procedure Date  . Cesarean section   . Cholecystectomy   . Gastric bypass   . Knee surgery     No family history on file.  History  Substance Use Topics  . Smoking status: Current Every Day Smoker -- 0.5 packs/day    Types: Cigarettes  . Smokeless tobacco: Not on file  . Alcohol Use: Yes     Comment: occasional    OB History    Grav Para Term Preterm Abortions TAB SAB Ect Mult Living                  Review of Systems  Constitutional: Negative.  Negative for fever, chills and diaphoresis.  HENT: Negative.   Eyes: Negative.   Respiratory: Negative.  Negative for shortness of breath.   Cardiovascular: Negative.   Gastrointestinal: Positive for nausea, vomiting and abdominal pain. Negative for diarrhea, constipation, blood in stool and abdominal distention.  Genitourinary: Negative for dysuria, urgency and hematuria.  Neurological: Negative.   Psychiatric/Behavioral: Negative.   All other systems reviewed and are negative.    Allergies  Ace inhibitors  Home Medications   Current Outpatient Rx  Name  Route  Sig  Dispense  Refill  . ALPRAZOLAM 1 MG PO TABS   Oral   Take 1 mg by mouth 4 (four) times daily as needed. For anxiety         . AMPHETAMINE-DEXTROAMPHETAMINE 20 MG PO TABS   Oral   Take 40 mg by mouth 2 (two) times daily.          . DULOXETINE HCL 60 MG PO CPEP   Oral   Take 60 mg by mouth 2 (two) times daily.          . INSULIN ASPART 100 UNIT/ML [place] SOLN   Subcutaneous   Inject 4-5 Units into the skin 3 (three) times  daily before meals.         . INSULIN DETEMIR 100 UNIT/ML [place] SOLN   Subcutaneous   Inject 22-27 Units into the skin at bedtime.         Marland Kitchen LAMOTRIGINE 150 MG PO TABS   Oral   Take 150 mg by mouth 2 (two) times daily.         . OXYCODONE HCL 15 MG PO TABS   Oral   Take 15 mg by mouth every 6 (six) hours as needed. For pain         . RISPERIDONE 2 MG PO TABS   Oral   Take 2 mg by mouth at bedtime.           BP 158/113  Pulse 77  Temp 97.6 F (36.4 C) (Oral)  Resp 16  SpO2 100%  LMP 07/27/2012  Physical Exam  Nursing note and vitals reviewed. Constitutional: She is oriented to person, place, and time. She appears well-developed and well-nourished.  HENT:  Head: Normocephalic and atraumatic.  Eyes: Conjunctivae normal and EOM are normal.  Pupils are equal, round, and reactive to light.  Neck: Normal range of motion. Neck supple.  Cardiovascular: Normal rate.   Pulmonary/Chest: Effort normal and breath sounds normal. No respiratory distress.  Abdominal: Soft. Bowel sounds are normal. She exhibits no distension. There is tenderness. There is no rebound and no guarding.       Epigastric tenderness  Musculoskeletal: Normal range of motion. She exhibits no edema and no tenderness.  Neurological: She is alert and oriented to person, place, and time. She has normal reflexes.  Skin: Skin is warm and dry.  Psychiatric: She has a normal mood and affect.    ED Course  Procedures (including critical care time) No further vomiting  in the ER.  Epigatric pain better as well.  Zofran and IVF given with good results.  Blood sugars trending down.  Labs Reviewed  CBC  LIPASE, BLOOD  TROPONIN I  COMPREHENSIVE METABOLIC PANEL   No results found.   No diagnosis found.    MDM   Peptic ulcer with pmh of the same.  Labs unremarkable except glucose elevated after no sliding scale coverage at home.  No ketoacidosis. Sugars Trending down after sq insulin in the ER.  Will continue to monitor  sliding scale and give evemir at home.  Patient will follow up with GI in [facility] tomorrow. Rx for zofran and carafate.  Continue zantac until follow up.  Follow up with pcp for diabetes this week.     Date: 10/25/2012  Rate: 90  Rhythm: normal sinus rhythm  QRS Axis: normal  Intervals: normal  ST/T Wave abnormalities: normal  Conduction Disutrbances:none  Narrative Interpretation:   Old EKG Reviewed: unchanged  Labs Reviewed  COMPREHENSIVE METABOLIC PANEL - Abnormal; Notable for the following:    Glucose, Bld 344 (*)     Albumin 3.3 (*)     All other components within normal limits  URINALYSIS, ROUTINE W REFLEX MICROSCOPIC - Abnormal; Notable for the following:    Glucose, UA >1000 (*)     Ketones, ur 15 (*)     All other components  within normal limits  GLUCOSE, CAPILLARY - Abnormal; Notable for the following:    Glucose-Capillary 350 (*)     All other components within normal limits  GLUCOSE, CAPILLARY - Abnormal; Notable for the following:    Glucose-Capillary 309 (*)     All other components within normal limits  CBC  LIPASE, BLOOD  TROPONIN I  POCT PREGNANCY, URINE  URINE MICROSCOPIC-ADD ON  LAB REPORT - SCANNED          Remi Haggard, NP 10/26/12 1139

## 2012-10-27 NOTE — ED Provider Notes (Signed)
Medical screening examination/treatment/procedure(s) were performed by non-physician practitioner and as supervising physician I was immediately available for consultation/collaboration.  Judye Lorino R. Marri Mcneff, MD 10/27/12 1056 

## 2013-05-22 ENCOUNTER — Encounter: Payer: Self-pay | Admitting: Gastroenterology

## 2013-07-19 LAB — HEPATIC FUNCTION PANEL
A-G Ratio: 0.9 (ref 0.8–1.7)
ALT (SGPT): 16 U/L (ref 12.0–78.0)
AST (SGOT): 8 U/L — ABNORMAL LOW (ref 15–37)
Albumin: 3.2 g/dL — ABNORMAL LOW (ref 3.4–5.0)
Alk. phosphatase: 103 U/L (ref 45–117)
Bilirubin, direct: 0.2 MG/DL (ref 0.0–0.2)
Bilirubin, total: 0.6 MG/DL (ref 0.2–1.0)
Globulin: 3.5 g/dL (ref 2.0–4.0)
Protein, total: 6.7 g/dL (ref 6.4–8.2)

## 2013-07-19 LAB — CBC WITH AUTOMATED DIFF
ABS. BASOPHILS: 0 10*3/uL (ref 0.0–0.06)
ABS. EOSINOPHILS: 0 10*3/uL (ref 0.0–0.4)
ABS. LYMPHOCYTES: 2.5 10*3/uL (ref 0.9–3.6)
ABS. MONOCYTES: 0.5 10*3/uL (ref 0.05–1.2)
ABS. NEUTROPHILS: 9.4 10*3/uL — ABNORMAL HIGH (ref 1.8–8.0)
BASOPHILS: 0 % (ref 0–2)
EOSINOPHILS: 0 % (ref 0–5)
HCT: 37.4 % (ref 35.0–45.0)
HGB: 12.9 g/dL (ref 12.0–16.0)
LYMPHOCYTES: 20 % — ABNORMAL LOW (ref 21–52)
MCH: 28 PG (ref 24.0–34.0)
MCHC: 34.5 g/dL (ref 31.0–37.0)
MCV: 81.3 FL (ref 74.0–97.0)
MONOCYTES: 4 % (ref 3–10)
MPV: 13 FL — ABNORMAL HIGH (ref 9.2–11.8)
NEUTROPHILS: 76 % — ABNORMAL HIGH (ref 40–73)
PLATELET: 241 10*3/uL (ref 135–420)
RBC: 4.6 M/uL (ref 4.20–5.30)
RDW: 13.4 % (ref 11.6–14.5)
WBC: 12.5 10*3/uL (ref 4.6–13.2)

## 2013-07-19 LAB — METABOLIC PANEL, BASIC
Anion gap: 13 mmol/L (ref 3.0–18)
BUN/Creatinine ratio: 12 (ref 12–20)
BUN: 12 MG/DL (ref 7.0–18)
CO2: 24 mmol/L (ref 21–32)
Calcium: 8.7 MG/DL (ref 8.5–10.1)
Chloride: 104 mmol/L (ref 100–108)
Creatinine: 1.04 MG/DL (ref 0.6–1.3)
GFR est AA: >60 mL/min/{1.73_m2} (ref >60)
GFR est non-AA: >60 mL/min/{1.73_m2} (ref >60)
Glucose: 361 mg/dL — ABNORMAL HIGH (ref 74–99)
Potassium: 4.1 mmol/L (ref 3.5–5.5)
Sodium: 141 mmol/L (ref 136–145)

## 2013-07-19 LAB — URINALYSIS W/ RFLX MICROSCOPIC
Bilirubin: NEGATIVE
Blood: NEGATIVE
Glucose: >1000 mg/dL — AB
Ketone: 40 mg/dL — AB
Leukocyte Esterase: NEGATIVE
Nitrites: NEGATIVE
Protein: NEGATIVE mg/dL
Specific gravity: 1.02 (ref 1.003–1.030)
Urobilinogen: 0.2 EU/dL (ref 0.2–1.0)
pH (UA): 7.5 (ref 5.0–8.0)

## 2013-07-19 LAB — LIPASE: Lipase: 89 U/L (ref 73–393)

## 2013-07-19 LAB — HCG URINE, QL: HCG urine, QL: NEGATIVE

## 2013-07-19 LAB — GLUCOSE, POC: Glucose (POC): 340 mg/dL — ABNORMAL HIGH (ref 70–110)

## 2013-07-19 MED ADMIN — 0.9% sodium chloride infusion 2,000 mL: INTRAVENOUS | @ 22:00:00 | NDC 00409798309

## 2013-07-19 MED ADMIN — HYDROmorphone (PF) (DILAUDID) injection 1 mg: INTRAVENOUS | @ 22:00:00 | NDC 00409128331

## 2013-07-19 MED ADMIN — insulin regular (NOVOLIN R, HUMULIN R) injection 10 Units: INTRAVENOUS | @ 23:00:00 | NDC 00002821517

## 2013-07-19 MED ADMIN — famotidine (PF) (PEPCID) injection 20 mg: INTRAVENOUS | @ 22:00:00 | NDC 63323073912

## 2013-07-19 MED ADMIN — morphine injection 4 mg: INTRAVENOUS | @ 22:00:00 | NDC 00409189101

## 2013-07-19 MED ADMIN — mylanta/donnatal(GI COCKTAIL): ORAL | @ 22:00:00 | NDC 17856042305

## 2013-07-19 MED ADMIN — ondansetron (ZOFRAN) injection 4 mg: INTRAVENOUS | @ 22:00:00 | NDC 23155037831

## 2013-07-19 NOTE — ED Provider Notes (Addendum)
HPI Comments: Alejandra Fletcher is a  37 y.o. Morbidly obese african [place] female smoker h/o Gastric Ulcer, IDDM  presents to the ED via private vehicle c/o sharp intermittent upper abdominal pain with associated acid reflux, belching symptoms and hunger pains. She reports pain is very similar to her pain in the past with Gastric Ulcer. Denies fever, chills, dizziness, cp, palps, doe, orthopnea, sob, cough, n/v/d, constipation, brbpr, melena, flank pain, blood in urine, urinary symptoms.     She reports she moved from [place], [place] within the past month and has yet to establish care.     FHX: Denies colorectal cancer.      PSX: EGD 06/2012    Patient is a 37 y.o. female presenting with epigastric pain. The history is provided by the patient. History limited by: No communication barrier.   Epigastric Pain   This is a new problem. The current episode started 6 to 12 hours ago. The problem occurs constantly. The problem has not changed since onset.The pain is associated with vomiting (Pt makes an association with prevous epigastric pain and intestinal ulcer. ). The pain is located in the epigastric region. The quality of the pain is sharp and cramping. The pain is moderate. Pertinent negatives include no fever, no diarrhea, no nausea, no vomiting, no constipation, no dysuria, no frequency, no headaches, no arthralgias and no chest pain. Nothing worsens the pain. The pain is relieved by nothing.        Past Medical History   Diagnosis Date   ??? Gastric ulcer    ??? Gastrointestinal disorder      infections   ??? Diabetes         History reviewed. No pertinent past surgical history.      History reviewed. No pertinent family history.     History     Social History   ??? Marital Status: MARRIED     Spouse Name: N/A     Number of Children: N/A   ??? Years of Education: N/A     Occupational History   ??? Not on file.     Social History Main Topics   ??? Smoking status: Current Every Day Smoker -- 0.50 packs/day   ??? Smokeless tobacco: Not  on file   ??? Alcohol Use: Yes      Comment: occasionally   ??? Drug Use: No   ??? Sexually Active: Not on file     Other Topics Concern   ??? Not on file     Social History Narrative   ??? No narrative on file                  ALLERGIES: Ace inhibitors      Review of Systems   Constitutional: Negative.  Negative for fever and chills.   HENT: Negative.  Negative for ear pain and sore throat.    Eyes: Negative for pain and visual disturbance.   Respiratory: Negative.  Negative for cough and shortness of breath.    Cardiovascular: Negative.  Negative for chest pain and palpitations.   Gastrointestinal: Positive for abdominal pain (epigastric pain). Negative for nausea, vomiting, diarrhea, constipation and blood in stool.   Genitourinary: Negative.  Negative for dysuria, urgency and frequency.   Musculoskeletal: Negative.  Negative for joint swelling and arthralgias.   Skin: Negative.  Negative for color change, pallor, rash and wound.   Neurological: Negative.  Negative for dizziness and headaches.   Psychiatric/Behavioral: Negative.  Negative for behavioral problems. The patient is  not nervous/anxious.        Filed Vitals:    07/19/13 1706 07/19/13 1715   BP: 138/109 162/108   Pulse: 82 76   Temp: 98.3 ??F (36.8 ??C)    Resp: 22 26   Height: 5\' 2"  (1.575 m)    Weight: 99.791 kg (220 lb)    SpO2: 100% 100%            Physical Exam   Nursing note and vitals reviewed.  Constitutional: She is oriented to person, place, and time. She appears well-developed and well-nourished. No distress.   HENT:   Head: Normocephalic and atraumatic.   Right Ear: Hearing, tympanic membrane, external ear and ear canal normal.   Left Ear: Hearing, tympanic membrane, external ear and ear canal normal.   Nose: Nose normal. No mucosal edema or rhinorrhea.   Mouth/Throat: Uvula is midline, oropharynx is clear and moist and mucous membranes are normal. No edematous. No posterior oropharyngeal edema, posterior oropharyngeal erythema or tonsillar abscesses.    Eyes: Conjunctivae and EOM are normal. Pupils are equal, round, and reactive to light.   Neck: Trachea normal, normal range of motion, full passive range of motion without pain and phonation normal. Neck supple. No tracheal tenderness, no spinous process tenderness and no muscular tenderness present. No rigidity. No edema, no erythema and normal range of motion present. No mass present.   Cardiovascular: Normal rate, regular rhythm and normal heart sounds.  Exam reveals no gallop and no friction rub.    No murmur heard.  Pulmonary/Chest: Effort normal and breath sounds normal. No accessory muscle usage. Not tachypneic. No respiratory distress. She has no decreased breath sounds. She has no wheezes. She has no rhonchi. She has no rales.   Abdominal: Soft. Bowel sounds are normal. She exhibits no distension and no mass. There is no tenderness. There is no rigidity, no rebound, no guarding, no CVA tenderness, no tenderness at McBurney's point and negative Murphy's sign.   At the time of my exam (after I assumed care of the patient) her abdomen was completely soft, NTTP. Normoactive BS.      Genitourinary:   NE   Musculoskeletal: Normal range of motion.   Neurological: She is alert and oriented to person, place, and time. She has normal strength and normal reflexes. No sensory deficit.   Skin: Skin is warm and dry. She is not diaphoretic.   Psychiatric: She has a normal mood and affect. Her behavior is normal.        MDM     Differential Diagnosis; Clinical Impression; Plan:     DDX: Abdominal pain, Pregnancy, Pancreatitis, Ectopic Pregnancy, Gastritis, AMI, Gastroenteritis, Colitis, Diverticulitis, Ovarian Cyst, Ovarian Torsion, Tubo-Ovarian Abscess, PID, PUD, Cholecystitis, Choledocholithiasis, Mesenteric Ischemia, Ectopic Pregnancy Rupture, Pelvic Pain Syndrome, Acute Cystitis, Pyelonephritis, Renal Colic, Biliary Colic, Perforation, Splenic Flexure Syndrome, Abdominal Aortic Aneurysm, Gastrointestinal Bleed.      Discussed with NP Ed Clovis Cao concerning patient Alejandra Fletcher, standard discussion of reason for visit, HPI, ROS, PE, and current results available.  Recommendation for re-evaluation following meds then d/c if improved. I agree to assume the care of this patient at this time as the previous provider's shift has come to a close. I have introduced myself to the patient, explained that I was the provider who would be following the remaining course of their stay in the Emergency Department. I subsequently reaffirmed the history by the preceding provider, I have interviewed, and reexamined the patient.     Benita Stabile  [place], [place]  July 19, 2013  6:10 PM     Progress Note    Patient: Alejandra Fletcher  MRN: [id number]  Date: 07/19/2013     Age:  37 y.o.,      Sex: female    Date of Birth:  September 08, 1976      Alejandra Fletcher is improved, resting quietly and comfortably, NAD, will continue to monitor closely.  Completely pain free. Soft abdomen, NTTP. Will give Insulin 10 units IV prior to discharge for management of Hyperglycemia.   Michaelyn Barter, PA  July 19, 2013  7:02 PM    Will increase Prilosec to 40 mg BID. Drink plenty of water.     Reviewed workup results, any meds, and discharge instructions OR admission plan with patient and any family present.  Answered all questions. Michaelyn Barter, PA  7:04 PM    PLEASE FOLLOW-UP AS DIRECTED WITHOUT FAIL WITHIN THE TIME FRAME RECOMMENDED AS FAILURE TO DO SO COULD RESULT IN WORSENING OF YOUR PHYSICAL CONDITION, DEATH, AND OR PERMANENT DISABILITY.     RETURN TO THE EMERGENCY DEPARTMENT IF YOU ARE UNABLE TO FOLLOW-UP AS DIRECTED.     RETURN TO THE EMERGENCY DEPARTMENT IF YOU HAVE SYMPTOMS THAT DO NOT IMPROVE WITH TREATMENT, NEW SYMPTOMS, WORSENING SYMPTOMS, OR ANY OTHER CONCERNS.     THE PATIENT AGREES WITH THE DISCHARGE PLAN AND FOLLOW-UP INSTRUCTIONS. THE PATIENT AGREES TO REVIEW ALL HANDOUTS.       Amount and/or Complexity of Data Reviewed:   Clinical lab tests:  Ordered and reviewed  Tests in the  medicine section of the CPT??:  Ordered and reviewed   Decide to obtain previous medical records or to obtain history from someone other than the patient:  Yes   Obtain history from someone other than the patient:  No   Review and summarize past medical records:  Yes   Discuss the patient with another provider:  Yes   Independant visualization of image, tracing, or specimen:  Yes  Risk of Significant Complications, Morbidity, and/or Mortality:   Presenting problems:  Moderate  Diagnostic procedures:  Moderate  Management options:  Moderate      Procedures    PROGRESS NTOE:  The care of this Pt was assumed by Alvy Bimler PA-C at normal shift turnover.  Katherine Roan, NP  6:00 PM

## 2013-07-19 NOTE — ED Notes (Signed)
Pt sts her pain has come back.  Ed, NP aware and new orders received.

## 2013-07-19 NOTE — ED Notes (Signed)
Pt on the phone with husband.  Verbalizes that she is feeling better.

## 2013-07-19 NOTE — ED Provider Notes (Signed)
I was personally available for consultation in the emergency department.  I have not seen the patient but have reviewed the chart prior to discharge and agree with the documentation recorded by the MLP, including the assessment, treatment plan, and disposition.  Imaan Padgett, DO

## 2013-07-19 NOTE — ED Notes (Signed)
Pt verbalizes that she feels better.

## 2013-07-19 NOTE — ED Notes (Signed)
Pts BS is 340.  Barbara Cower, [place] aware.

## 2013-07-20 NOTE — ED Provider Notes (Signed)
I was personally available for consultation in the emergency department.  I have not seen the patient but have reviewed the chart prior to discharge and agree with the documentation recorded by the MLP, including the assessment, treatment plan, and disposition.  Lyall Faciane, DO

## 2013-07-21 LAB — EKG, 12 LEAD, INITIAL
Atrial Rate: 66 {beats}/min
Calculated P Axis: 27 degrees
Calculated R Axis: 25 degrees
Calculated T Axis: 34 degrees
Diagnosis: NORMAL
P-R Interval: 114 ms
Q-T Interval: 418 ms
QRS Duration: 82 ms
QTC Calculation (Bezet): 438 ms
Ventricular Rate: 66 {beats}/min

## 2013-07-31 NOTE — ED Provider Notes (Signed)
I was personally available for consultation in the emergency department.  I have not seen the patient but have reviewed the chart prior to discharge and agree with the documentation recorded by the MLP, including the assessment, treatment plan, and disposition.  Leatta Alewine, DO

## 2014-02-28 NOTE — ED Provider Notes (Signed)
Patient is a 38 y.o. female presenting with general illness.   Generalized Body Aches  Pertinent negatives include no chest pain, no abdominal pain and no shortness of breath.   pt presents with c/o myalgias and arthralgias; h/o fibromyalalgia and RA per patient.  Has been pain med free for >53mos becing more active w/water walking but over the weekend, she was more intensely active and is traveling to [facility] and feels that she over-did it and doesn't want to spoil her sight seeing activities by being in pain and is asking for narcotics for pain relief.    Denies trauma, fever, rash, n/v/d, dysuria.    Denies ETOH, tobacco, illicits.      Past Medical History   Diagnosis Date   ??? Gastric ulcer    ??? Gastrointestinal disorder      infections   ??? Diabetes    ??? Fibromyalgia         Past Surgical History   Procedure Laterality Date   ??? Hx cesarean section     ??? Hx gastric bypass     ??? Hx knee arthroscopy     ??? Hx cholecystectomy           History reviewed. No pertinent family history.     History     Social History   ??? Marital Status: MARRIED     Spouse Name: N/A     Number of Children: N/A   ??? Years of Education: N/A     Occupational History   ??? Not on file.     Social History Main Topics   ??? Smoking status: Current Every Day Smoker -- 0.50 packs/day   ??? Smokeless tobacco: Never Used   ??? Alcohol Use: Yes      Comment: occasionally   ??? Drug Use: No   ??? Sexual Activity: Not on file     Other Topics Concern   ??? Not on file     Social History Narrative                  ALLERGIES: Ace inhibitors      Review of Systems   Constitutional: Negative for fever.   Respiratory: Negative for shortness of breath.    Cardiovascular: Negative for chest pain.   Gastrointestinal: Negative for nausea, vomiting, abdominal pain and diarrhea.   Genitourinary: Negative for dysuria.   Musculoskeletal: Positive for myalgias and arthralgias.   Skin: Negative for rash.   All other systems reviewed and are negative.      Filed Vitals:     02/28/14 2024   BP: 157/98   Pulse: 95   Temp: 98 ??F (36.7 ??C)   Resp: 14   Height: 5\' 2"  (1.575 m)   Weight: 99.791 kg (220 lb)   SpO2: 99%            Physical Exam   Constitutional: She is oriented to person, place, and time. She appears well-developed and well-nourished. No distress.   HENT:   Head: Normocephalic and atraumatic.   Eyes: EOM are normal. Pupils are equal, round, and reactive to light. No scleral icterus.   Neck: No JVD present. No tracheal deviation present. No thyromegaly present.   Cardiovascular: Normal rate, regular rhythm, normal heart sounds and intact distal pulses.  Exam reveals no gallop and no friction rub.    No murmur heard.  Pulmonary/Chest: Effort normal and breath sounds normal. No stridor. No respiratory distress. She has no wheezes. She has no rales.  She exhibits no tenderness.   Abdominal: Soft. She exhibits no distension and no mass. There is no tenderness. There is no rebound and no guarding.   Musculoskeletal: She exhibits no edema or tenderness.   Lymphadenopathy:     She has no cervical adenopathy.   Neurological: She is alert and oriented to person, place, and time.   Skin: Skin is warm and dry. No rash noted. She is not diaphoretic. No erythema. No pallor.   Psychiatric: She has a normal mood and affect. Her behavior is normal. Thought content normal.   Nursing note and vitals reviewed.       MDM  Number of Diagnoses or Management Options  Arthralgia:   Chronic pain:   Diagnosis management comments: Differential: chronic pain; RA; fibromyalgia;    No new pains just exacerbation of years-old pains but going to be doing heavy sightseeing and would like help.  Gave PCP referral to use upon return.      Procedures    9:08 PM  Diagnosis:   1. Chronic pain    2. Arthralgia          Disposition: home    Follow-up Information    Follow up With Details Comments Contact Info    Phillis KnackAron R Boney, MD Schedule an appointment as soon as possible for a visit in 1 week  [address]   [address]  [facility]  [place] [id number]  [phone]      [facility] EM[facility]  If symptoms worsen return immediately [address]  [place] [id number]  [phone]          Current Discharge Medication List      START taking these medications    Details   HYDROcodone-acetaminophen (NORCO) 5-325 mg per tablet Take 1-2 tablets PO every 4-6 hours as needed for pain control.  If over the counter ibuprofen or acetaminophen was suggested, then only take the vicodin for pain not well controlled with the over the counter medication.  Qty: 20 Tab, Refills: 0      ibuprofen (MOTRIN) 800 mg tablet Take 1 Tab by mouth every eight (8) hours for 5 days.  Qty: 15 Tab, Refills: 0         CONTINUE these medications which have NOT CHANGED    Details   DULoxetine (CYMBALTA) 60 mg capsule Take 120 mg by mouth daily.      FLUOXETINE HCL (PROZAC PO) Take  by mouth.      insulin detemir (LEVEMIR) 100 unit/mL injection by SubCUTAneous route nightly.      omeprazole (PRILOSEC) 40 mg capsule Take 1 Cap by mouth two (2) times a day.  Qty: 40 Cap, Refills: 0      OTHER Other Insulin-unknown name

## 2014-02-28 NOTE — ED Notes (Signed)
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  Pain started to get bad yesterday morning.  None of the usual remedies are working.  Has been off pain medicine--for about 4-5 months.  Trying exercise and natural remedies--

## 2014-02-28 NOTE — ED Notes (Signed)
Patient armband removed and shredded    Patient has received written discharge instructions which were reviewed prior to discharge. The patient was given the opportunity to ask questions or verbalize concerns regarding his/her hospital stay and/or discharge orders.    Two prescription medications were ordered upon discharge. The purpose and any additional precautions needed when taking these prescriptions were reviewed.

## 2014-03-01 MED ORDER — IBUPROFEN 800 MG TAB
800 mg | ORAL_TABLET | Freq: Three times a day (TID) | ORAL | Status: AC
Start: 2014-03-01 — End: 2014-03-05

## 2014-03-01 MED ORDER — HYDROCODONE-ACETAMINOPHEN 5 MG-325 MG TAB
5-325 mg | ORAL_TABLET | ORAL | Status: DC
Start: 2014-03-01 — End: 2014-03-26

## 2014-03-25 NOTE — ED Notes (Signed)
Pt reports severe muscle pain from fibromyalgia and ran out of Norco tablets. Pt states "need to break the pain cycle".

## 2014-03-25 NOTE — ED Notes (Signed)
Pt sts she is here because she used her "last norco and it was not helping the pain anyway.  I went to [facility] and they sent me to a pain center which was not pain management.  I moved here from [place] recently and I have no PCP."
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
## 2014-03-26 MED ORDER — LORAZEPAM 1 MG TAB
1 mg | ORAL_TABLET | Freq: Three times a day (TID) | ORAL | Status: DC | PRN
Start: 2014-03-26 — End: 2014-04-19

## 2014-03-26 MED ORDER — IBUPROFEN 600 MG TAB
600 mg | ORAL_TABLET | Freq: Three times a day (TID) | ORAL | Status: DC | PRN
Start: 2014-03-26 — End: 2014-04-19

## 2014-03-26 MED ORDER — HYDROCODONE-ACETAMINOPHEN 5 MG-325 MG TAB
5-325 mg | ORAL_TABLET | ORAL | Status: DC
Start: 2014-03-26 — End: 2014-04-19

## 2014-03-26 MED ORDER — FLUCONAZOLE 150 MG TAB
150 mg | ORAL_TABLET | ORAL | Status: AC
Start: 2014-03-26 — End: 2014-03-26

## 2014-04-19 LAB — URINALYSIS W/ RFLX MICROSCOPIC
Bilirubin: NEGATIVE
Blood: NEGATIVE
Glucose: >1000 mg/dL — AB
Ketone: NEGATIVE mg/dL
Leukocyte Esterase: NEGATIVE
Nitrites: NEGATIVE
Protein: NEGATIVE mg/dL
Specific gravity: 1.01 (ref 1.003–1.030)
Urobilinogen: 0.2 EU/dL (ref 0.2–1.0)
pH (UA): 6 (ref 5.0–8.0)

## 2014-04-19 LAB — HCG URINE, QL: HCG urine, QL: NEGATIVE

## 2014-04-19 LAB — GLUCOSE, POC: Glucose (POC): 309 mg/dL — ABNORMAL HIGH (ref 70–110)

## 2014-04-19 MED ORDER — INSULIN DETEMIR 100 UNIT/ML (3 ML) SUB-Q PEN
100 unit/mL (3 mL) | PACK | SUBCUTANEOUS | Status: AC
Start: 2014-04-19 — End: ?

## 2014-04-19 MED ORDER — HYDROCODONE-ACETAMINOPHEN 5 MG-325 MG TAB
5-325 mg | ORAL_TABLET | ORAL | Status: DC
Start: 2014-04-19 — End: 2014-04-23

## 2014-04-19 MED ORDER — TERBINAFINE 250 MG TAB
250 mg | ORAL_TABLET | Freq: Every day | ORAL | Status: AC
Start: 2014-04-19 — End: 2014-05-19

## 2014-04-19 MED ORDER — IBUPROFEN 400 MG TAB
400 mg | Freq: Once | ORAL | Status: DC
Start: 2014-04-19 — End: 2014-04-19

## 2014-04-19 MED ORDER — HYDROCHLOROTHIAZIDE 25 MG TAB
25 mg | ORAL_TABLET | Freq: Every day | ORAL | Status: DC
Start: 2014-04-19 — End: 2014-04-23

## 2014-04-19 MED ORDER — INSULIN ASPART 100 UNIT/ML (3 ML) SUB-Q PEN
100 unit/mL (3 mL) | PACK | SUBCUTANEOUS | Status: AC
Start: 2014-04-19 — End: ?

## 2014-04-19 MED FILL — IBUPROFEN 400 MG TAB: 400 mg | ORAL | Qty: 2

## 2014-04-19 NOTE — ED Notes (Signed)
IBprofen pulled from [facility] pt did not tell me she could not take it until after it was opened.  Will be wasted.  Pt husband was on the phone and said he will pick pt up at the ED in 30 to 45 minutes.

## 2014-04-19 NOTE — ED Notes (Signed)
Pt discharged, upon discharging pt she asked for medication, she does not have a ride here at this time and she is asking for narcotics.  IBUPROFEN was offered and refused.  Pt was told that she was [facility] to control pain and that she can pick it up on her way home today.  Pt stated she does not feel comfortable leaving without a narcotic to take here.  Provider went in to talk with pt.

## 2014-04-19 NOTE — ED Notes (Signed)
Pt stated "I just moved here" upon inquiry she moved here 1 year ago.  Stated "I did not have health insurance for a long time, I just got health insurance March 1 and I don't have a pcp yet.  Pt was encouraged to get a pcp.  Pt used to take nph/normal insulin on a sliding scale and Levimir 37 units daily.  Pt does not remember what she took for hypertension.  Pt is seated on the bed in a position of comfort asking for pain meds.

## 2014-04-19 NOTE — ED Notes (Signed)
PTS. FSBS 304. PT. Hypertensive - pt. Moved to [facility].

## 2014-04-19 NOTE — ED Notes (Signed)
PT. Stated "I have several issues going on" - pt. Asked to specify the main complaint - pt. Stated "I have a lump on my scalp that is hard and painful" - pt. Points to base of R scalp. PT. Stated "prior to this I started having sores form they were bloody and pussy". PT. Stated "I also have a UTI and yeast infection".

## 2014-04-19 NOTE — ED Provider Notes (Addendum)
Patient is a 38 y.o. female presenting with skin problem, frequency, and yeast infection. The history is provided by the patient.   Skin Problem     Urinary Frequency   Associated symptoms include frequency.   Yeast Infection   Associated symptoms include frequency.   pt reports right parietal-occipital scalp infection for a couple of weeks.  Pt is a diabetic who hasn't had meds for 'quite a while'.  Just obtained insurance w/her employer 02/10/14 but hasn't yet found a PCP.  Not taking HTN meds.  Would appreciate a PCP referral.  Also believes she has a UTI based on urinary frequency and her urine's odor makes her believes she has a yeast infection in her urine.      Would like pain relief from her scalp infection.    Denies ETOH, tobacco, illicits.      Past Medical History   Diagnosis Date   ??? Gastric ulcer    ??? Gastrointestinal disorder      infections   ??? Diabetes (HCC)    ??? Fibromyalgia    ??? Ill-defined condition      fibromyalgia        Past Surgical History   Procedure Laterality Date   ??? Hx cesarean section     ??? Hx gastric bypass     ??? Hx knee arthroscopy     ??? Hx cholecystectomy           History reviewed. No pertinent family history.     History     Social History   ??? Marital Status: MARRIED     Spouse Name: N/A     Number of Children: N/A   ??? Years of Education: N/A     Occupational History   ??? Not on file.     Social History Main Topics   ??? Smoking status: Never Smoker    ??? Smokeless tobacco: Never Used   ??? Alcohol Use: Yes      Comment: occasionally   ??? Drug Use: No   ??? Sexual Activity: Not on file     Other Topics Concern   ??? Not on file     Social History Narrative                  ALLERGIES: Ace inhibitors      Review of Systems   Genitourinary: Positive for frequency.   Skin: Positive for rash.   All other systems reviewed and are negative.      Filed Vitals:    04/19/14 1536 04/19/14 1552 04/19/14 1600 04/19/14 1630   BP: 223/121  174/137 178/98   Pulse: 87  79 80   Temp: 98.3 ??F (36.8 ??C)       Resp: 18  23 13    Height: 5\' 2"  (1.575 m)      Weight: 99.791 kg (220 lb)      SpO2: 100% 100% 100% 100%            Physical Exam   Constitutional: She is oriented to person, place, and time. She appears well-developed and well-nourished. No distress.   overweight   HENT:   Head: Normocephalic and atraumatic.   Single, right parietal-occipital boggy ulcerated lesion, tender, @1 .5cm in diameter.   Eyes: EOM are normal. Pupils are equal, round, and reactive to light. No scleral icterus.   Neck: No JVD present. No tracheal deviation present. No thyromegaly present.   Cardiovascular: Normal rate, regular rhythm, normal heart sounds and intact distal pulses.  Exam reveals no gallop and no friction rub.    No murmur heard.  Pulmonary/Chest: Effort normal and breath sounds normal. No stridor. No respiratory distress. She has no wheezes. She has no rales. She exhibits no tenderness.   Abdominal: Soft. She exhibits no distension and no mass. There is no tenderness. There is no rebound and no guarding.   Musculoskeletal: She exhibits no edema or tenderness.   Lymphadenopathy:     She has no cervical adenopathy.   Neurological: She is alert and oriented to person, place, and time.   Skin: Skin is warm and dry. No rash noted. She is not diaphoretic. No erythema. No pallor.   Psychiatric: She has a normal mood and affect. Her behavior is normal. Thought content normal.   Nursing note and vitals reviewed.       MDM  Number of Diagnoses or Management Options  Diabetes mellitus due to underlying condition without complications [facility](HCC):   Essential hypertension:   Kerion:   Diagnosis management comments: Differential: UTI; tinea; DM; electrolyte abnormalities; HTN; kerion    Untreated DM, HTN, kerion.  No outstanding emergency today; BS 304 and urine analysis shows glucosuria but no UTI or proteinuria or dehydration.  Do not see HTN meds previously on her chart.  Have arranged for f/up and will write for Rx.  Pt asked for narcotic  pain relief from her kerion multiple times.  [facility] system shows HgA1C of 11 in 11/2013 and several visits for fibromyalgia.             Amount and/or Complexity of Data Reviewed  Clinical lab tests: ordered and reviewed        Procedures    4:40 PM  Diagnosis:   1. Kerion    2. Diabetes mellitus due to underlying condition without complications (HCC)    3. Essential hypertension          Disposition: home    Follow-up Information    Follow up With Details Comments Contact Info    Penelope GalasLynnette Moore, MD On 04/23/2014 9am [address]  [address]  [facility]  [place] [id number]  [phone]      [facility] EMERGENCY DEPT  If symptoms worsen return immediately [address]  [place] [id number]  [phone]          Current Discharge Medication List      START taking these medications    Details   terbinafine HCl (LAMISIL) 250 mg tablet Take 1 Tab by mouth daily for 30 days.  Qty: 30 Tab, Refills: 1      insulin detemir (LEVEMIR) 100 unit/mL (3 mL) pen 37 units at night  Qty: 1 Package, Refills: 0      insulin aspart (NOVOLOG FLEXPEN) 100 unit/mL flexpen 0-150 = 0 units  151-200 = 2 units  201-250 = 4 units  251-300 = 6 units  301-350 = 8 units  351-400 = 10 units  401+ -> Come to the Emergency Department  Qty: 1 Package, Refills: 0      hydrochlorothiazide (HYDRODIURIL) 25 mg tablet Take 1 Tab by mouth daily for 10 days.  Qty: 10 Tab, Refills: 0      HYDROcodone-acetaminophen (NORCO) 5-325 mg per tablet Take 1-2 tablets PO every 4-6 hours as needed for pain control.  If over the counter ibuprofen or acetaminophen was suggested, then only take the vicodin for pain not well controlled with the over the counter medication.  Qty: 8 Tab,  Refills: 0         CONTINUE these medications which have NOT CHANGED    Details   DULoxetine (CYMBALTA) 60 mg capsule Take 120 mg by mouth daily.      FLUOXETINE HCL (PROZAC PO) Take  by mouth.      OTHER Other Insulin-unknown name      omeprazole (PRILOSEC) 40  mg capsule Take 1 Cap by mouth two (2) times a day.  Qty: 40 Cap, Refills: 0         STOP taking these medications       insulin aspart (NOVOLOG) 100 unit/mL injection Comments:   Reason for Stopping:

## 2014-04-22 LAB — CULTURE, URINE
Culture result:: 60000
Culture result:: 80000
Culture result:: 80000
Culture: 60000

## 2014-04-22 NOTE — Telephone Encounter (Signed)
Pt is calling again to follow up on previous stated information. Pt is in a tremendous amount of pain. Insurance company needs prior auth for the medication they prescribed. Please advise.

## 2014-04-22 NOTE — Telephone Encounter (Signed)
Pt called stating that she went to the ER and was diagnosed with a karion. Pt is wanted to get pre authorization for prescribed medication for Terbinafine 250 mg 1 tab a day for 30 days- prior to being seen for first time on 04/23/14. Please advise.        [facility] [address] [phone]      Pts Home- [phone]. Message can be left on either number.

## 2014-04-23 LAB — AMB POC URINE, MICROALBUMIN, SEMIQUANTITATIVE: Microalbumin urine (POC): 150 mg/L

## 2014-04-23 LAB — AMB POC GLUCOSE TEST: Glucose dose-GTT: 280

## 2014-04-23 LAB — AMB POC HEMOGLOBIN A1C: Hemoglobin A1c (POC): 14.6 %

## 2014-04-23 MED ORDER — HYDROCODONE-ACETAMINOPHEN 5 MG-325 MG TAB
5-325 mg | ORAL_TABLET | ORAL | Status: DC
Start: 2014-04-23 — End: 2014-06-21

## 2014-04-23 MED ORDER — HYDROCHLOROTHIAZIDE 25 MG TAB
25 mg | ORAL_TABLET | Freq: Every day | ORAL | Status: AC
Start: 2014-04-23 — End: 2014-05-23

## 2014-04-23 MED ORDER — TELMISARTAN 20 MG TAB
20 mg | ORAL_TABLET | Freq: Every day | ORAL | Status: DC
Start: 2014-04-23 — End: 2014-06-26

## 2014-04-23 MED ORDER — METFORMIN 500 MG TAB
500 mg | ORAL_TABLET | Freq: Two times a day (BID) | ORAL | Status: DC
Start: 2014-04-23 — End: 2015-09-30

## 2014-04-23 NOTE — Patient Instructions (Signed)
Bipolar Disorder: After Your Visit  Your Care Instructions  Bipolar disorder is an illness that causes extreme mood changes, from times of very high energy (manic episodes) to times of depression. But many people with bipolar disorder show only the symptoms of depression. These moods may cause problems with your work, school, family life, friendships, and how well you function.  This disease is also called manic-depression.  There is no cure for bipolar disorder, but it can be helped with medicines. Counseling may also help. It is important to take your medicines exactly as prescribed, even when you feel well. You may need lifelong treatment.  Follow-up care is a key part of your treatment and safety. Be sure to make and go to all appointments, and call your doctor if you are having problems. It's also a good idea to know your test results and keep a list of the medicines you take.  How can you care for yourself at home?  ?? Be safe with medicines. Take your medicines exactly as prescribed. Do not stop or change a medicine without talking to your doctor first. You and your doctor may need to try different combinations of medicines to find what works for you.  ?? Take your medicines on schedule to keep your moods even. When you feel good, you may think that you do not need your medicines. But it is important to keep taking them.  ?? Go to your counseling sessions. Call and talk with your counselor if you can't go to a session or if you don't think the sessions are helping. Do not just stop going.  ?? Get at least 30 minutes of activity on most days of the week. Walking is a good choice. You also may want to do other things, such as running, swimming, or cycling.  ?? Get enough sleep. Keep your room dark and quiet. Try to go to bed at the same time every night.  ?? Eat a healthy diet. This includes whole grains, dairy, fruits, vegetables, and protein. Eat foods from each of these groups.  ?? Try to lower your stress.  Manage your time, build a strong support system, and lead a healthy lifestyle. To lower your stress, try physical activity, slow deep breathing, or getting a massage.  ?? Do not use alcohol or illegal drugs.  ?? Learn the early signs of your mood changes. You can then take steps to help yourself feel better.  ?? Ask for help from friends and family when you need it. You may need help with daily chores when you are depressed. When you are manic, you may need support to control your high energy levels.  What should you do if someone in your family has bipolar disorder?  ?? Learn about the disease and the signs that it is getting worse.  ?? Remind your family member that you love him or her.  ?? Make a plan with all family members about how to take care of your loved one when his or her symptoms are bad.  ?? Talk about your fears and concerns and those of other family members. Seek counseling if needed.  ?? Do not focus attention only on the person who is in treatment.  ?? Remind yourself that it will take time for changes to occur.  ?? Do not blame yourself for the disease.  ?? Know your legal rights and the legal rights of your family member. Support groups or counselors can help you with this information.  ??   Take care of yourself. Keep up with your own interests, such as your career, hobbies, and friends. Use exercise, positive self-talk, deep breathing, and other relaxing exercises to help lower your stress.  ?? Give yourself time to grieve. You may need to deal with emotions such as anger, fear, and frustration. After you work through your feelings, you will be better able to care for yourself and your family.  ?? If you are having a hard time with your feelings or with your relationship with your family member, talk with a counselor.  When should you call for help?  Call 911 anytime you think you may need emergency care. For example, call if:  ?? You feel like hurting yourself or someone else.  ?? Someone who has bipolar  disorder displays dangerous behavior, and you think the person might hurt himself or herself or someone else.  Call your doctor now or seek immediate medical care if:  ?? You hear voices.  ?? Someone you know has bipolar disorder and talks about suicide. Keep the numbers for these national suicide hotlines: 1-800-273-TALK 862-736-9556) and 1-800-SUICIDE (401)163-0997). If a suicide threat seems real, with a specific plan and a way to carry it out, stay with the person, or ask someone you trust to stay with the person, until you can get help.  ?? Someone you know has bipolar disorder and:  ?? Starts to give away possessions.  ?? Is using illegal drugs or drinking alcohol heavily.  ?? Talks or writes about death, including writing suicide notes or talking about guns, knives, or pills.  ?? Talks or writes about hurting someone else.  ?? Starts to spend a lot of time alone.  ?? Acts very aggressively or suddenly appears calm.  ?? Talks about beliefs that are not based in reality (delusions).  Watch closely for changes in your health, and be sure to contact your doctor if:  ?? You cannot go to your counseling sessions.   Where can you learn more?   Go to [facility].hu  Enter K052 in the search box to learn more about "Bipolar Disorder: After Your Visit."   ?? 2006-2015 Healthwise, Incorporated. Care instructions adapted under license by [employer] (which disclaims liability or warranty for this information). This care instruction is for use with your licensed healthcare professional. If you have questions about a medical condition or this instruction, always ask your healthcare professional. Healthwise, Incorporated disclaims any warranty or liability for your use of this information.  Content Version: 10.4.390249; Current as of: October 26, 2013              Learning About Diabetes Food Guidelines  Your Care Instructions  Meal planning is important to manage diabetes. It helps keep your blood sugar at  a target level (which you set with your doctor). You don't have to eat special foods. You can eat what your family eats, including sweets once in a while. But you do have to pay attention to how often you eat and how much you eat of certain foods.  You may want to work with a dietitian or a certified diabetes educator (CDE) to help you plan meals and snacks. A dietitian or CDE can also help you lose weight if that is one of your goals.  What should you know about eating carbs?  Managing the amount of carbohydrate (carbs) you eat is an important part of healthy meals when you have diabetes. Carbohydrate is found in many foods.  ?? Learn which  foods have carbs. And learn the amounts of carbs in different foods.  ?? Bread, cereal, pasta, and rice have about 15 grams of carbs in a serving. A serving is 1 slice of bread (1 ounce), ?? cup of cooked cereal, or 1/3 cup of cooked pasta or rice.  ?? Fruits have 15 grams of carbs in a serving. A serving is 1 small fresh fruit, such as an apple or orange; ?? of a banana; ?? cup of cooked or canned fruit; ?? cup of fruit juice; 1 cup of melon or raspberries; or 2 tablespoons of dried fruit.  ?? Milk and no-sugar-added yogurt have 15 grams of carbs in a serving. A serving is 1 cup of milk or 2/3 cup of no-sugar-added yogurt.  ?? Starchy vegetables have 15 grams of carbs in a serving. A serving is ?? cup of mashed potatoes or sweet potato; 1 cup winter squash; ?? of a small baked potato; ?? cup of cooked dried beans; or ?? cup cooked corn or green peas.  ?? Learn how much carbs to eat each day and at each meal. A dietitian or CDE can teach you how to keep track of the amount of carbs you eat. This is called carbohydrate counting.  ?? If you are not sure how to count carbohydrate grams, use the Plate Method to plan meals. It is a good, quick way to make sure that you have a balanced meal. It also helps you spread carbs throughout the day.  ?? Divide your plate by types of foods. Put non-starchy  vegetables on half the plate, meat or other protein food on one-quarter of the plate, and a grain or starchy vegetable in the final quarter of the plate. To this you can add a small piece of fruit and 1 cup of milk or yogurt, depending on how many carbs you are supposed to eat at a meal.  ?? Try to eat about the same amount of carbs at each meal. Do not "save up" your daily allowance of carbs to eat at one meal.  ?? Proteins have very little or no carbs per serving. Examples of proteins are beef, chicken, [place]key, fish, eggs, tofu, cheese, cottage cheese, and peanut butter. A serving size of meat is 3 ounces, which is about the size of a deck of cards. Examples of meat substitute serving sizes (equal to 1 ounce of meat) are 1/4 cup of cottage cheese, 1 egg, 1 tablespoon of peanut butter, and ?? cup of tofu.  How can you eat out and still eat healthy?  ?? Learn to estimate the serving sizes of foods that have carbohydrate. If you measure food at home, it will be easier to estimate the amount in a serving of restaurant food.  ?? If the meal you order has too much carbohydrate (such as potatoes, corn, or baked beans), ask to have a low-carbohydrate food instead. Ask for a salad or green vegetables.  ?? If you use insulin, check your blood sugar before and after eating out to help you plan how much to eat in the future.  ?? If you eat more carbohydrate at a meal than you had planned, take a walk or do other exercise. This will help lower your blood sugar.  What else should you know?  ?? Limit saturated fat, such as the fat from meat and dairy products. This is a healthy choice because people who have diabetes are at higher risk of heart disease. So choose lean cuts of  meat and nonfat or low-fat dairy products. Use olive or canola oil instead of butter or shortening when cooking.  ?? Don't skip meals. Your blood sugar may drop too low if you skip meals and take certain diabetes pills or insulin.  ?? Check with your doctor before  you drink alcohol. Alcohol can cause your blood sugar to drop too low. Alcohol can also cause a bad reaction if you take certain diabetes pills.  Follow-up care is a key part of your treatment and safety. Be sure to make and go to all appointments, and call your doctor if you are having problems. It's also a good idea to know your test results and keep a list of the medicines you take.   Where can you learn more?   Go to [email or web address].huhttp://www.healthwise.net/[facility]  Enter I147 in the search box to learn more about "Learning About Diabetes Food Guidelines."   ?? 2006-2015 Healthwise, Incorporated. Care instructions adapted under license by [employer] (which disclaims liability or warranty for this information). This care instruction is for use with your licensed healthcare professional. If you have questions about a medical condition or this instruction, always ask your healthcare professional. Healthwise, Incorporated disclaims any warranty or liability for your use of this information.  Content Version: 10.4.390249; Current as of: May 16, 2013              Fibromyalgia: After Your Visit  Your Care Instructions  Fibromyalgia is a painful condition that is not completely understood by medical experts. The cause of fibromyalgia is not known. It can make you feel tired and ache all over. It causes tender spots at specific points of the body that hurt only when you press on them. You may have trouble sleeping, as well as other symptoms. These problems can upset your work and home life.  Symptoms tend to come and go, although they may never go away completely. Fibromyalgia does not harm your muscles, joints, or organs.  Follow-up care is a key part of your treatment and safety. Be sure to make and go to all appointments, and call your doctor if you are having problems. It???s also a good idea to know your test results and keep a list of the medicines you take.  How can you care for yourself at home?  ?? Exercise often. Walk, swim,  or bike to help with pain and sleep problems and to make you feel better.  ?? Try to get a good night's sleep. Go to bed and get up at the same time each day, whether you feel rested or not. Make sure you have a good mattress and pillow.  ?? Reduce stress. Avoid things that cause you stress, if you can. If not, work at making them less stressful. Learn to use biofeedback, guided imagery, meditation, or other methods to relax.  ?? Make healthy changes. Eat a balanced diet, quit smoking, and limit alcohol and caffeine.  ?? Use a heating pad set on low or take warm baths or showers for pain. Using cold packs for up to 20 minutes at a time can also relieve pain. Put a thin cloth between the cold pack and your skin. A gentle massage might help too.  ?? Take your medicines exactly as prescribed. Call your doctor if you think you are having a problem with your medicine.  ?? Certain types of antidepressants may be used to improve sleep, relieve pain, and, in some cases, treat depression. If your doctor prescribes a medicine for  fibromyalgia, be sure to take it exactly as prescribed.  ?? Antidepressant medicines can sometimes help with fibromyalgia even if you are not depressed. If your doctor prescribes a medicine for fibromyalgia, be sure to take it exactly as prescribed.  ?? Learn about fibromyalgia. This makes coping easier. Then, take an active role in your treatment.  ?? Think about joining a support group with others who have fibromyalgia to learn more and get support.  When should you call for help?  Watch closely for changes in your health, and be sure to contact your doctor if:  ?? You feel sad, helpless, or hopeless; lose interest in things you used to enjoy; or have other symptoms of depression.  ?? Your fibromyalgia symptoms get worse.   Where can you learn more?   Go to [employer]Blog.hu  Enter V003 in the search box to learn more about "Fibromyalgia: After Your Visit."   ?? 2006-2015 Healthwise,  Incorporated. Care instructions adapted under license by [employer] (which disclaims liability or warranty for this information). This care instruction is for use with your licensed healthcare professional. If you have questions about a medical condition or this instruction, always ask your healthcare professional. Healthwise, Incorporated disclaims any warranty or liability for your use of this information.  Content Version: 10.4.390249; Current as of: February 21, 2013

## 2014-04-23 NOTE — Telephone Encounter (Signed)
Amber from [employer] is calling for a prior auth for terbinafine HCl (LAMISIL) 250 mg tablet. They can be reached at [phone]. Please advise.

## 2014-04-24 NOTE — Progress Notes (Signed)
HPI  Alejandra Fletcher is a 38 y.o. female that presents with  Chief Complaint   Patient presents with   ??? New Patient   ??? Establish Care   ??? ED Follow-up     Establishing care. [place]oved from [place] last year.     Pt was seen at the ER last week for painful sore on right back side of her head diagnosed as a kerion. She  was started on lamisil, but reports it needs preauthorization, she purchased 5 of the pills pending approval. She requests pain med refill for the pain it causes, throbbing. She reports the sore started started approx 2 months ago, and she applied tea tree oil. It had pus at one point before scabbing over she reports. On week ago it became more painful, hard and "mushy".  She reports she gets scaly itchy scalp.     Htn: has had approx 10 years.  In the ER she was started on hctz, as her bp was elevated.  Needs refill.    Diabetes: diagnosed age 89. Is supposed to take Levimir 37 units, but admits she may take it 4 days per week.  She does not check glucose regularly, but when she does its 200's-300's range. She has regular insulin available, but not using. She requests the dosing for the sliding scale, as she reports she does not have container with instructions  From the ER. Her last a1c she recalls was approx 11. She reports that last time she recalls her dm was controlled was probably 2008 when it was 7.5, and this was after she had gastric bypass in 2007. At that time she did not require insulin. Her peak weight prior to bypass was 360, her lowest was 214, but she has regained.  She admits to poor dietary patterns. She reports she developed coughing with ace inhibitors, but not tongue/lip swelling or rashes. She has not had regular eye exams.     Musculoskeletal: She has left knee arthritis and reports diagnosed with fibromyalgia in 2008 which she at one point managed to control pain without meds with  water therapy and other natural remedies. She reports prior to moving here the pain  increased and she was seeing pain management and had tried different pain medications, ending up with oxycodone 15 mg. She does not want strong pain medication.     Bipolar depression: She is under psychiatrist care, still from [place] and she sees them every 6 months. She would like to transfer psychiatry care to [place]. Her psychiatrist prescribes xanax, adderal, prozac, cymbalta, and depakote. Pt does not feel bipolar is well controlled, but denies having suicidal/homicidal ideation or plans of hurting self or others.       Review of Systems:  ROS:  History obtained from the patient  ?? General: negative for - chills, fever, weight changes or malaise, or fatigue  ?? HEENT: no sore throat, nasal congestion,  vision problems or ear problems  ?? Respiratory: no cough, shortness of breath, or wheezing  ?? Cardiovascular: no chest pain, palpitations, or dyspnea on exertion  ?? Gastrointestinal: no abdominal pain, N/V, loose stool off/on or black or bloody stools  ?? Genitourinary:  No dysuria or hematuria  ?? Musculoskeletal: fibromyalgia symptoms  ?? Neurological: no numbness, tingling, headache or dizziness, or seizures  ?? Dermatological:  Scalp as per hpi.  ?? Psychological: see hpi.      Past Medical History   Diagnosis Date   ??? Gastric ulcer    ???  Gastrointestinal disorder      infections   ??? Diabetes (HCC)    ??? Fibromyalgia    ??? 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 Fainting Mother    ??? Hypertension Mother    ??? Asthma Mother    ??? Obesity Mother    ??? Diabetes Father    ??? Hypertension Father    ??? Gout Father    ??? Alcohol abuse Paternal Grandfather    ??? Other Paternal Grandfather      Nicotine abuse/ Bronchitis   ??? Heart Disease Paternal Grandmother    ??? Heart Surgery Paternal Grandmother    ??? Alzheimer Paternal Grandmother    ??? No Known Problems Sister    ??? No Known Problems Maternal Grandmother    ??? No Known Problems Maternal  Grandfather        Past Surgical History   Procedure Laterality Date   ??? Hx cesarean section     ??? Hx gastric bypass     ??? Hx knee arthroscopy     ??? Hx cholecystectomy         History     Social History   ??? Marital Status: MARRIED     Spouse Name: N/A     Number of Children: N/A   ??? Years of Education: N/A     Occupational History   ??? Not on file.     Social History Main Topics   ??? Smoking status: Never Smoker    ??? Smokeless tobacco: Never Used   ??? Alcohol Use: Yes      Comment: occasionally   ??? Drug Use: No   ??? Sexual Activity:     Partners: Male     Pharmacist, hospitalBirth Control/ Protection: None     Other Topics Concern   ??? Military Service No   ??? Blood Transfusions Yes     2004, May 10...after delivery of child   ??? Caffeine Concern No   ??? Occupational Exposure No   ??? Hobby Hazards No   ??? Sleep Concern Yes     Insomnia   ??? Stress Concern Yes     life stressors   ??? Weight Concern Yes     over weight   ??? Special Diet Yes     lifestyle, food changes   ??? Back Care Yes     PT for back problems   ??? Exercise No   ??? Bike Helmet No   ??? Seat Belt Yes   ??? Self-Exams Yes     Social History Narrative       Outpatient Prescriptions Marked as Taking for the 04/23/14 encounter (Office Visit) with Penelope GalasLynnette Ardie Mclennan, MD   Medication Sig Dispense Refill   ??? ESOMEPRAZOLE MAGNESIUM (NEXIUM 24HR PO) Take 1 Cap by mouth. daily       ??? dextroamphetamine-amphetamine (ADDERALL) 20 mg tablet Take 20 mg by mouth three (3) times daily.       ??? FLUoxetine (PROZAC) 40 mg capsule Take 40 mg by mouth daily.       ??? ALPRAZolam (XANAX) 1 mg tablet Take 1 mg by mouth nightly as needed.    0   ??? divalproex ER (DEPAKOTE ER) 500 mg ER tablet Take 500 mg by mouth daily.       ??? Insulin Needles, Disposable, (NANO PEN NEEDLE) 32 x 5/32 " ndle  1 Device.       ??? HYDROcodone-acetaminophen (NORCO) 5-325 mg per tablet Take 1 Tablet PO every  8 hours as needed for pain control. Do not combine with other sedating or controlled medications. Avoid alcohol.  15 Tab  0   ???  hydrochlorothiazide (HYDRODIURIL) 25 mg tablet Take 1 Tab by mouth daily for 30 days. For bp control.  30 Tab  0   ??? terbinafine HCl (LAMISIL) 250 mg tablet Take 1 Tab by mouth daily for 30 days.  30 Tab  1   ??? insulin detemir (LEVEMIR) 100 unit/mL (3 mL) pen 37 units at night  1 Package  0   ??? insulin aspart (NOVOLOG FLEXPEN) 100 unit/mL flexpen 0-150 = 0 units  151-200 = 2 units  201-250 = 4 units  251-300 = 6 units  301-350 = 8 units  351-400 = 10 units  401+ -> Come to the Emergency Department  1 Package  0   ??? DULoxetine (CYMBALTA) 60 mg capsule Take 120 mg by mouth daily.           Allergies   Allergen Reactions   ??? Topiramate Anxiety   ??? Ace Inhibitors Nausea and Vomiting   ??? Nsaids (Non-Steroidal Anti-Inflammatory Drug) Nausea and Vomiting       Filed Vitals:    04/23/14 0931   BP: 152/108   Pulse: 81   Temp: 97.5 ??F (36.4 ??C)   TempSrc: Oral   Resp: 20   Height: 5\' 2"  (1.575 m)   Weight: 227 lb (102.967 kg)   SpO2: 96%         Physical Examination: General appearance - alert, well appearing, and in no distress    Mental status - alert, oriented to person, place, and time  Eyes - pupils equal and reactive, extraocular eye movements intact  Ears - bilateral TM's and external ear canals normal  Nose - normal and patent, no erythema or discharge , sinuses normal and nontender   Mouth - mucous membranes moist, pharynx normal without lesions   Neck - supple, no significant adenopathy  PULM: clear to auscultation, no wheezes, rales or rhonchi, symmetric air entry  CVS - normal rate, regular rhythm, normal S1, S2, no murmurs, rubs, clicks or gallops  GI - soft, nontender, nondistended, no masses or organomegaly; hyperpigmented patch on abdomen (burn per pt)   Back exam - mild low back muscle tenderness  Neurological - alert, oriented, normal speech, no focal findings or movement disorder noted   Musculoskeletal - no joint tenderness, deformity or swelling; right knee with small scab (from abrasion per pt); no  redness or drainage.   Extremities - no pedal edema noted, good pulses, normal color, temperature   Skin - right occiput raised tender indurated sore with scabbing noted. No drainage. Mid scalp with with scaliness and abraised area.scalp hair thinning noted.    LABS/IMAGING:  Office Visit on 04/23/2014   Component Date Value Ref Range Status   ??? Microalbumin urine (POC) 04/23/2014 150   Final    CRE 200 mg/dL   ??? Microalbumin/creat ratio (POC) 04/23/2014 30-300   Final    Abnormal   ??? Hemoglobin A1c (POC) 04/23/2014 14.6   Final   ??? Glucose dose-GTT 04/23/2014 280   Final   Admission on 04/19/2014, Discharged on 04/19/2014   Component Date Value Ref Range Status   ??? Glucose (POC) 04/19/2014 309* 70 - 110 mg/dL Final    Comment: (NOTE)  The FDA has indicated  that no capillary point of care blood glucose   monitoring systems are approved for use in "critically ill" patients,   however they have not defined this population. The College of   American Pathologists has recommended that these devices should not   be used in cases such as severe hypotension, dehydration, shock, and   hyperglycemic-hyperosmolar state, amongst others.  Venous or arterial   collection is the recommended specimen for testing these patients.     ??? Color 04/19/2014 YELLOW   Final   ??? Appearance 04/19/2014 CLEAR   Final   ??? Specific gravity 04/19/2014 1.010  1.003 - 1.030   Final   ??? pH (UA) 04/19/2014 6.0  5.0 - 8.0   Final   ??? Protein 04/19/2014 NEGATIVE   NEG mg/dL Final   ??? Glucose 16/09/9603 >1000* NEG mg/dL Final   ??? Ketone 04/19/2014 NEGATIVE   NEG mg/dL Final   ??? Bilirubin 54/08/8118 NEGATIVE   NEG   Final   ??? Blood 04/19/2014 NEGATIVE   NEG   Final   ??? Urobilinogen 04/19/2014 0.2  0.2 - 1.0 EU/dL Final   ??? Nitrites 14/78/2956 NEGATIVE   NEG   Final   ??? Leukocyte Esterase 04/19/2014 NEGATIVE   NEG   Final   ??? Special Requests: 04/19/2014 NO SPECIAL REQUESTS   Final   ??? Culture result: 04/19/2014    Final                    Value:80000   COLONIES/mL  ESCHERICHIA COLI     ??? Culture result: 04/19/2014    Final                    Value:80000  ESCHERICHIA COLI  2ND MORPHOTYPE     ??? Culture result: 04/19/2014    Final                    Value:60000  COLONIES/mL  MIXED GRAM POSITIVE FLORA, PROBABLE SKIN/GENITAL CONTAMINATION.     ??? HCG urine, Ql. 04/19/2014 NEGATIVE   NEG   Final    Test results should be confirmed using serum quantitative hCG when detection of pregnancy is critical and before performing any critical medical procedure.      07/19/13 ekg: wnl      Assessment/ Plan    Alejandra Fletcher was seen today for new patient, establish care and ed follow-up.    Diagnoses and associated orders for this visit:    Encounter to establish care with new doctor    Insulin-requiring or dependent type II diabetes mellitus (HCC):  Uncontrolled.compliance stressed; add metformin, advised eye exam. Will consult with Dr. Valda Favia diabetic expert in 2 weeks. nutriton referral. She is advised her insulin sliding scale will be listed on her AVS medication list.  - AMB POC URINE, MICROALBUMIN, SEMIQUANTITATIVE  - CBC WITH AUTOMATED DIFF  - METABOLIC PANEL, COMPREHENSIVE  - LIPID PANEL  - VITAMIN D, 25 HYDROXY  - TSH+FREE T4  - AMB POC HEMOGLOBIN A1C  - AMB POC GLUCOSE TEST  - metFORMIN (GLUCOPHAGE) 500 mg tablet; Take 1 Tab by mouth two (2) times daily (with meals). For glucose control  - ANTI GAD ANTIBODIES    Essential hypertension  - CBC WITH AUTOMATED DIFF  - METABOLIC PANEL, COMPREHENSIVE  - LIPID PANEL  - VITAMIN D, 25 HYDROXY  - TSH+FREE T4  - hydrochlorothiazide (HYDRODIURIL) 25 mg tablet; Take 1 Tab by mouth daily for 30 days. For bp  control.-   -  telmisartan (MICARDIS) 20 mg tablet; Take 1 Tab by mouth daily. For blood pressure control.      Obesity:  Nutrition referral  - METABOLIC PANEL, COMPREHENSIVE  - LIPID PANEL  - TSH+FREE T4      Skin lesion of scalp: lamisil preauthorization to be processed by office staff.  - REFERRAL TO DERMATOLOGY  -  HYDROcodone-acetaminophen (NORCO) 5-325 mg per tablet; Take 1 Tablet PO every  8 hours as needed for pain control. Do not combine with other sedating or controlled medications. Avoid alcohol.  - FUNGUS STAIN    Fibromyalgia  - REFERRAL TO PAIN MANAGEMENT    Bipolar affective disorder, current episode depression, unspecified severity (HCC). Hotline for [employer] [phone]. Life given  - REFERRAL TO PSYCHIATRY    History of gastric bypass  - CBC WITH AUTOMATED DIFF  - METABOLIC PANEL, COMPREHENSIVE  - LIPID PANEL  - VITAMIN D, 25 HYDROXY  - TSH+FREE T4       Follow-up Disposition:  Return in about 2 weeks (around 05/07/2014), or if symptoms worsen or fail to improve, for schedule a 30 minute diabetic/htn consult with dr. Valda Favia.  I have reviewed return/Er/medication precautions and  discussed the diagnosis with the patient (or guardian/parent if applicable)  and the intended plan as seen in the above orders.  The patient has been offered and/or received an after-visit summary and questions were answered concerning future plans. Patient verbalized understanding of all instruction.    Medication Side Effects and Warnings were discussed with patient: yes  Patient Labs were reviewed: yes   Patient Past Records were reviewed:  Yes  (depaul records)    Penelope Galas, MD

## 2014-04-25 LAB — FUNGUS STAIN

## 2014-04-25 NOTE — Telephone Encounter (Signed)
Will leave encounter open as still awaiting results of prior auth.

## 2014-04-25 NOTE — Progress Notes (Signed)
Quick Note:    Please advise patient the fungal stain we collected was negative and that at her dermatology appt today, they may want to retest.  ______

## 2014-04-25 NOTE — Telephone Encounter (Signed)
-----   Message from Penelope GalasLynnette Moore, MD sent at 04/25/2014  2:01 PM EDT -----  Please advise patient the fungal stain we collected was negative and that at her dermatology appt today, they may want to retest.

## 2014-04-25 NOTE — Telephone Encounter (Signed)
Returned call regarding prior auth for the following medicatio.  Medication has been sent for review by Medical Director.  Case number [id number]

## 2014-04-25 NOTE — Telephone Encounter (Signed)
Called derm provider and advised them of the below stated information, they confirmed understanding,faxed over a copy of results per Deliah GoodyNick- Derm Tech request to [phone].

## 2014-04-25 NOTE — Telephone Encounter (Signed)
Prior Berkley Harveyauth has already been submitted to pt insurance company awaiting decisions

## 2014-04-26 NOTE — Telephone Encounter (Signed)
Pls always remember, patient needs a visit for all controlled substances refills esp Schedule II

## 2014-04-26 NOTE — Telephone Encounter (Signed)
Pt calling to request a refill on dextroamphetamine-amphetamine (ADDERALL) 20 mg tablet pt states she takes this 4 times a day. Pt last had this filled on 04/01/14(only gets a 21 day supply) Pt states she has not heard back from her psych referral yet to get an appt(has called their office). Please advise.

## 2014-04-27 NOTE — Telephone Encounter (Signed)
Dr Christell ConstantMoore,    Please be advise of the following information, recd prior auth denial decision letter. Please advise if another medication will need to be rx for this patient

## 2014-04-28 LAB — CVD REPORT

## 2014-04-28 LAB — CBC WITH AUTOMATED DIFF
ABS. BASOPHILS: 0 10*3/uL (ref 0.0–0.2)
ABS. EOSINOPHILS: 0.1 10*3/uL (ref 0.0–0.4)
ABS. IMM. GRANS.: 0 10*3/uL (ref 0.0–0.1)
ABS. MONOCYTES: 0.7 10*3/uL (ref 0.1–0.9)
ABS. NEUTROPHILS: 6.3 10*3/uL (ref 1.4–7.0)
Abs Lymphocytes: 2.7 10*3/uL (ref 0.7–3.1)
BASOPHILS: 0 %
EOSINOPHILS: 1 %
HCT: 35.7 % (ref 34.0–46.6)
HGB: 11.5 g/dL (ref 11.1–15.9)
IMMATURE GRANULOCYTES: 0 %
Lymphocytes: 27 %
MCH: 27.1 pg (ref 26.6–33.0)
MCHC: 32.2 g/dL (ref 31.5–35.7)
MCV: 84 fL (ref 79–97)
MONOCYTES: 7 %
NEUTROPHILS: 65 %
PLATELET: 321 10*3/uL (ref 150–379)
RBC: 4.24 x10E6/uL (ref 3.77–5.28)
RDW: 16.8 % — ABNORMAL HIGH (ref 12.3–15.4)
WBC: 9.8 10*3/uL (ref 3.4–10.8)

## 2014-04-28 LAB — METABOLIC PANEL, COMPREHENSIVE
A-G Ratio: 1.4 (ref 1.1–2.5)
ALT (SGPT): 27 IU/L (ref 0–32)
AST (SGOT): 26 IU/L (ref 0–40)
Albumin: 3.8 g/dL (ref 3.5–5.5)
Alk. phosphatase: 136 IU/L — ABNORMAL HIGH (ref 39–117)
BUN/Creatinine ratio: 15 (ref 8–20)
BUN: 10 mg/dL (ref 6–20)
Bilirubin, total: 0.2 mg/dL (ref 0.0–1.2)
CO2: 23 mmol/L (ref 18–29)
Calcium: 8.6 mg/dL — ABNORMAL LOW (ref 8.7–10.2)
Chloride: 96 mmol/L — ABNORMAL LOW (ref 97–108)
Creatinine: 0.66 mg/dL (ref 0.57–1.00)
GFR est AA: 131 mL/min/{1.73_m2} (ref >59)
GFR est non-AA: 114 mL/min/{1.73_m2} (ref >59)
GLOBULIN, TOTAL: 2.8 g/dL (ref 1.5–4.5)
Glucose: 268 mg/dL — ABNORMAL HIGH (ref 65–99)
Potassium: 3.8 mmol/L (ref 3.5–5.2)
Protein, total: 6.6 g/dL (ref 6.0–8.5)
Sodium: 140 mmol/L (ref 134–144)

## 2014-04-28 LAB — LIPID PANEL
Cholesterol, total: 186 mg/dL (ref 100–199)
HDL Cholesterol: 79 mg/dL (ref >39)
LDL, calculated: 80 mg/dL (ref 0–99)
Triglyceride: 137 mg/dL (ref 0–149)
VLDL, calculated: 27 mg/dL (ref 5–40)

## 2014-04-28 LAB — ANTI GAD ANTIBODIES: Glutamic Acid Decarboxylase Ab: <0.5 U/mL

## 2014-04-28 LAB — TSH+FREE T4
Free T4: 0.9 ng/dL
TSH-ICMA: 0.78 uU/mL

## 2014-04-28 LAB — VITAMIN D, 25 HYDROXY: VITAMIN D, 25-HYDROXY: 4.4 ng/mL — ABNORMAL LOW (ref 30.0–100.0)

## 2014-04-30 NOTE — Telephone Encounter (Signed)
Alejandra Fletcher, Alejandra Maika Kaczmarek, LPN (You)  Yesterday  (7:47 AM)   Please contact derm regarding obtaining their office notes or patient how she was treatment. If the patient was treated by dermatology for the scalp lesion and prescribed their own medication, then we don't need to provide any further treatment. (Routing comment)[name], Alejandra Rashard Ryle, LPN (You)

## 2014-04-30 NOTE — Telephone Encounter (Signed)
Called servicing providers office and per providers office stated tht they will fax over progress notes.

## 2014-05-02 NOTE — Progress Notes (Signed)
Quick Note:    Pt vitamin d is very low and weekly vitamin d is ordered for 12 weeks.after completions she is to have level rechecked.  ______

## 2014-05-02 NOTE — Addendum Note (Signed)
Addended by: Penelope Galas on: 05/02/2014 10:14 PM     Modules accepted: Orders

## 2014-05-03 MED ORDER — ERGOCALCIFEROL (VITAMIN D2) 50,000 UNIT CAP
1250 mcg (50,000 unit) | ORAL_CAPSULE | ORAL | Status: DC
Start: 2014-05-03 — End: 2015-09-30

## 2014-05-03 NOTE — Telephone Encounter (Signed)
Rec'd prior auth back with denial for medication. Information forwarded to dermatologist office for review and to see if any additional medications need to be rx

## 2014-05-03 NOTE — Telephone Encounter (Signed)
Dr Christell Constant,    Can you approve the below requested medication   Requested Prescriptions     Pending Prescriptions Disp Refills   ??? dextroamphetamine-amphetamine (ADDERALL) 20 mg tablet 90 Tab 0     Sig: Take 1 Tab by mouth three (3) times daily. Max Daily Amount: 60 mg.     As the pt has just been seen by you

## 2014-05-03 NOTE — Telephone Encounter (Signed)
Rec'd inbound records from servicing provider forwards information to provider.

## 2014-05-13 NOTE — Telephone Encounter (Signed)
Pt has appt with psych June 4th, but pt must be seen twice before meds are filled. She states that psych told her that her PCP must fill meds in the mean time. Pt requesting refill for ALPRAZolam (XANAX) 1 mg tablet take one - 4 times a day. Only has 2-3 days left

## 2014-05-14 NOTE — Telephone Encounter (Signed)
Called and spk with patient and per pt stated tht she is requesting a refill on the medication due to she has yet to have her appt with psych provider.  Advised pt tht medication refill for a controled substance would require an appt.  Pt confirmed understanding and appt time date set per pt request.

## 2014-05-14 NOTE — Telephone Encounter (Signed)
Called pt and per pt stated tht she is no longer in need of this medications.

## 2014-05-15 MED ORDER — ALPRAZOLAM 1 MG TAB
1 mg | ORAL_TABLET | Freq: Four times a day (QID) | ORAL | Status: DC | PRN
Start: 2014-05-15 — End: 2014-05-30

## 2014-05-15 NOTE — Patient Instructions (Signed)
Please take Xanax only as directed. Please update [place] as to when [facility] will be taking over your care. Please ask them to fax over their notes to [place].  I will call you if there are problems with your urine drug screen.  Please call with any questions or concerns.  If symptoms persist or worsen, please come back or go to the Emergency Room.      Alprazolam (By mouth)   Alprazolam (al-PRA-zoe-lam)  Treats anxiety and panic disorder.   Brand Name(s):ALPRAZolam Intensol, Gabazolamine, Gabazolamine-0.5, Niravam, Xanax, Xanax XR   There may be other brand names for this medicine.  When This Medicine Should Not Be Used:   This medicine is not right for everyone. Do not use this medicine if you had an allergic reaction to alprazolam or to similar medicines, are pregnant, or have narrow-angle glaucoma.  How to Use This Medicine:   Tablet, Liquid, Dissolving Tablet, Long Acting Tablet  ?? Take your medicine as directed. Your dose may need to be changed several times to find what works best for you.  ?? Measure the oral liquid medicine with a marked measuring spoon, oral syringe, or medicine cup.  ?? Extended-release tablet: Swallow the extended-release tablet whole. Do not crush, break, or chew it.  ?? Disintegrating tablet: Dry your hands before you handle the tablet. Place the tablet on your tongue and let it dissolve.  ?? Missed dose: Take a dose as soon as you remember. If it is almost time for your next dose, wait until then and take a regular dose. Do not take extra medicine to make up for a missed dose.  ?? Store the medicine in a closed container at room temperature, away from heat, moisture, and direct light. Throw away any cotton that was in the bottle and reseal it tightly after each use.  Drugs and Foods to Avoid:   Ask your doctor or pharmacist before using any other medicine, including over-the-counter medicines, vitamins, and herbal products.  ?? Do not use this medicine if you are also using ketoconazole or  itraconazole.  ?? Some medicines and foods can affect how alprazolam works. Tell your doctor if you are using clarithromycin, cimetidine, cyclosporine, desipramine, diltiazem, ergotamine, erythromycin, fluconazole, fluoxetine, fluvoxamine, imipramine, nefazodone, nicardipine, nifedipine, sertraline, theophylline, birth control pills, or seizure medicine.  ?? Do not eat grapefruit or drink grapefruit juice while you are using this medicine.  ?? Tell your doctor if you use anything else that makes you sleepy. Some examples are allergy medicine, narcotic pain medicine, and alcohol.  Warnings While Using This Medicine:   ?? It is not safe to take this medicine during pregnancy. It could harm an unborn baby. Tell your doctor right away if you become pregnant.  ?? Tell your doctor if you are breastfeeding, or if you have glaucoma, lung problems, liver disease, kidney disease, or a history of drug or alcohol addiction, depression, mental illness, or seizures. Tell your doctor if you drink alcohol.  ?? This medicine can be habit-forming. Do not use more than your prescribed dose. Call your doctor if you think your medicine is not working.  ?? This drug has a higher risk of overdose. Call your doctor if you have extreme dizziness or weakness, a slow heartbeat, or problems with coordination or memory.  ?? This medicine may make you dizzy or drowsy. Do not drive, use machines, or do anything else that could be dangerous until you know how this medicine affects you.  ?? Do  not stop using this medicine suddenly. Your doctor will need to slowly decrease your dose before you stop it completely.  ?? Keep all medicine out of the reach of children. Never share your medicine with anyone.  Possible Side Effects While Using This Medicine:   Call your doctor right away if you notice any of these side effects:  ?? Allergic reaction: Itching or hives, swelling in your face or hands, swelling or tingling in your mouth or throat, chest tightness,  trouble breathing  ?? Blistering, peeling, red skin rash  ?? Confusion, problems with coordination or memory  ?? Extreme tiredness or weakness, slow heartbeat, trouble breathing or speaking  ?? Seizure  If you notice these less serious side effects, talk with your doctor:   ?? Change in appetite or weight  ?? Constipation  ?? Lightheadedness, drowsiness  ?? Nervousness, restlessness  ?? Loss of interest in sex  If you notice other side effects that you think are caused by this medicine, tell your doctor.   Call your doctor for medical advice about side effects. You may report side effects to FDA at 1-800-FDA-1088  ?? 2014 [facility] [employer] Information is for End User's use only and may not be sold, redistributed or otherwise used for commercial purposes.  The above information is an educational aid only. It is not intended as medical advice for individual conditions or treatments. Talk to your doctor, nurse or pharmacist before following any medical regimen to see if it is safe and effective for you.
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
## 2014-05-20 LAB — COMPLIANCE DRUG SCREEN/PRESCRIPTION MONITORING

## 2014-05-22 NOTE — Telephone Encounter (Signed)
-----   Message from Lynnette Moore, MD sent at 05/02/2014 10:14 PM EDT -----  Pt vitamin d is very low and weekly vitamin d is ordered for 12 weeks.after completions she is to have level rechecked.

## 2014-05-22 NOTE — Telephone Encounter (Signed)
Regarding below stated information. Called pt and left VM. Awaiting return call.

## 2014-05-30 MED ORDER — AMPHETAMINE-DEXTROAMPHETAMINE 20 MG TAB
20 mg | ORAL_TABLET | Freq: Four times a day (QID) | ORAL | Status: DC | PRN
Start: 2014-05-30 — End: 2014-06-15

## 2014-05-30 MED ORDER — ALPRAZOLAM 1 MG TAB
1 mg | ORAL_TABLET | Freq: Four times a day (QID) | ORAL | Status: AC | PRN
Start: 2014-05-30 — End: 2014-06-13

## 2014-05-30 NOTE — Patient Instructions (Signed)
Attention Deficit Hyperactivity Disorder (ADHD) in Adults: After Your Visit  Your Care Instructions  Attention deficit hyperactivity disorder, or ADHD, is a condition that makes it hard to pay attention. So you may have problems when you try to focus, get organized, and finish tasks. It might make you more active than other people. Or you might do things without thinking first.  ADHD is very common. It usually starts in early childhood. Many adults don't realize they have it until their children are diagnosed. Then they become aware of their own symptoms.  Doctors don't know what causes ADHD. But it often runs in families.  ADHD can be treated with medicines, behavior training, and counseling. Treatment can improve your life.  Follow-up care is a key part of your treatment and safety. Be sure to make and go to all appointments, and call your doctor if you are having problems. It's also a good idea to know your test results and keep a list of the medicines you take.  How can you care for yourself at home?  ?? Learn all you can about ADHD. This will help you and your family understand it better.  ?? Take your medicines exactly as prescribed. Call your doctor if you think you are having a problem with your medicine. You will get more details on the specific medicines your doctor prescribes.  ?? If you miss a dose of your medicine, do not take an extra dose.  ?? If your doctor suggests counseling, find a counselor you like and trust. Talk openly and honestly. Be willing to make some changes.  ?? Find a support group for adults with ADHD. Talking to others with the same problems can help you feel better. It can also give you ideas about how to best cope with the condition.  ?? Get rid of distractions at your work space. Keep your desk clean. Try not to face a window or busy hallway.  ?? Use files, planners, and other tools to keep you organized.  ?? Limit use of alcohol, and do not use illegal drugs. People with ADHD tend to  become addicted more easily than others. Tell your doctor if you need help to quit. Counseling, support groups, and sometimes medicines can help you stay free of alcohol or drugs.  ?? Get at least 30 minutes of physical activity on most days of the week. Exercise has been shown to help people cope with ADHD. Walking is a good choice. You also may want to do other activities, such as running, swimming, cycling, or playing tennis or team sports.  When should you call for help?  Watch closely for changes in your health, and be sure to contact your doctor if:  ?? You feel sad a lot or cry all the time.  ?? You have trouble sleeping, or you sleep too much.  ?? You find it hard to concentrate, make decisions, or remember things.  ?? You change how you normally eat.  ?? You feel guilty for no reason.   Where can you learn more?   Go to http://www.healthwise.net/[facility]  Enter B196 in the search box to learn more about "Attention Deficit Hyperactivity Disorder (ADHD) in Adults: After Your Visit."   ?? 2006-2015 Healthwise, Incorporated. Care instructions adapted under license by [employer] (which disclaims liability or warranty for this information). This care instruction is for use with your licensed healthcare professional. If you have questions about a medical condition or this instruction, always ask your healthcare professional. Healthwise,   Incorporated disclaims any warranty or liability for your use of this information.  Content Version: 10.5.422740; Current as of: October 26, 2013              Anxiety Disorder: After Your Visit  Your Care Instructions  Anxiety is a normal reaction to stress. Difficult situations can cause you to have symptoms such as sweaty palms and a nervous feeling.  In an anxiety disorder, the symptoms are far more severe. Constant worry, muscle tension, trouble sleeping, nausea and diarrhea, and other symptoms can make normal daily activities difficult or impossible. These symptoms may occur for  no reason, and they can affect your work, school, or social life. Medicines, counseling, and self-care can all help.  Follow-up care is a key part of your treatment and safety. Be sure to make and go to all appointments, and call your doctor if you are having problems. It's also a good idea to know your test results and keep a list of the medicines you take.  How can you care for yourself at home?  ?? Take medicines exactly as directed. Call your doctor if you think you are having a problem with your medicine.  ?? Go to your counseling sessions and follow-up appointments.  ?? Recognize and accept your anxiety. Then, when you are in a situation that makes you anxious, say to yourself, "This is not an emergency. I feel uncomfortable, but I am not in danger. I can keep going even if I feel anxious."  ?? Be kind to your body:  ?? Relieve tension with exercise or a massage.  ?? Get enough rest.  ?? Avoid alcohol, caffeine, nicotine, and illegal drugs. They can increase your anxiety level and cause sleep problems.  ?? Learn and do relaxation techniques. See below for more about these techniques.  ?? Engage your mind. Get out and do something you enjoy. Go to a funny movie, or take a walk or hike. Plan your day. Having too much or too little to do can make you anxious.  ?? Keep a record of your symptoms. Discuss your fears with a good friend or family member, or join a support group for people with similar problems. Talking to others sometimes relieves stress.  ?? Get involved in social groups, or volunteer to help others. Being alone sometimes makes things seem worse than they are.  ?? Get at least 30 minutes of exercise on most days of the week to relieve stress. Walking is a good choice. You also may want to do other activities, such as running, swimming, cycling, or playing tennis or team sports.  Relaxation techniques  Do relaxation exercises 10 to 20 minutes a day. You can play soothing, relaxing music while you do them, if you  wish.  ?? Tell others in your house that you are going to do your relaxation exercises. Ask them not to disturb you.  ?? Find a comfortable place, away from all distractions and noise.  ?? Lie down on your back, or sit with your back straight.  ?? Focus on your breathing. Make it slow and steady.  ?? Breathe in through your nose. Breathe out through either your nose or mouth.  ?? Breathe deeply, filling up the area between your navel and your rib cage. Breathe so that your belly goes up and down.  ?? Do not hold your breath.  ?? Breathe like this for 5 to 10 minutes. Notice the feeling of calmness throughout your whole body.  As you  continue to breathe slowly and deeply, relax by doing the following for another 5 to 10 minutes:  ?? Tighten and relax each muscle group in your body. You can begin at your toes and work your way up to your head.  ?? Imagine your muscle groups relaxing and becoming heavy.  ?? Empty your mind of all thoughts.  ?? Let yourself relax more and more deeply.  ?? Become aware of the state of calmness that surrounds you.  ?? When your relaxation time is over, you can bring yourself back to alertness by moving your fingers and toes and then your hands and feet and then stretching and moving your entire body. Sometimes people fall asleep during relaxation, but they usually wake up shortly afterward.  ?? Always give yourself time to return to full alertness before you drive a car or do anything that might cause an accident if you are not fully alert. Never play a relaxation tape while you drive a car.  When should you call for help?  Call 911 anytime you think you may need emergency care. For example, call if:  ?? You feel you cannot stop from hurting yourself or someone else.  Watch closely for changes in your health, and be sure to contact your doctor if:  ?? You have anxiety or fear that affects your life.  ?? You have symptoms of anxiety that are new or different from those you had before.   Where can you learn  more?   Go to [email or web address].huhttp://www.healthwise.net/[facility]  Enter P754 in the search box to learn more about "Anxiety Disorder: After Your Visit."   ?? 2006-2015 Healthwise, Incorporated. Care instructions adapted under license by [employer] [place] [employer] (which disclaims liability or warranty for this information). This care instruction is for use with your licensed healthcare professional. If you have questions about a medical condition or this instruction, always ask your healthcare professional. Healthwise, Incorporated disclaims any warranty or liability for your use of this information.  Content Version: 10.5.422740; Current as of: October 26, 2013              Bipolar Disorder: After Your Visit  Your Care Instructions  Bipolar disorder is an illness that causes extreme mood changes, from times of very high energy (manic episodes) to times of depression. But many people with bipolar disorder show only the symptoms of depression. These moods may cause problems with your work, school, family life, friendships, and how well you function.  This disease is also called manic-depression.  There is no cure for bipolar disorder, but it can be helped with medicines. Counseling may also help. It is important to take your medicines exactly as prescribed, even when you feel well. You may need lifelong treatment.  Follow-up care is a key part of your treatment and safety. Be sure to make and go to all appointments, and call your doctor if you are having problems. It's also a good idea to know your test results and keep a list of the medicines you take.  How can you care for yourself at home?  ?? Be safe with medicines. Take your medicines exactly as prescribed. Do not stop or change a medicine without talking to your doctor first. You and your doctor may need to try different combinations of medicines to find what works for you.  ?? Take your medicines on schedule to keep your moods even. When you feel good, you may think that you do not need  your medicines. But it is  important to keep taking them.  ?? Go to your counseling sessions. Call and talk with your counselor if you can't go to a session or if you don't think the sessions are helping. Do not just stop going.  ?? Get at least 30 minutes of activity on most days of the week. Walking is a good choice. You also may want to do other things, such as running, swimming, or cycling.  ?? Get enough sleep. Keep your room dark and quiet. Try to go to bed at the same time every night.  ?? Eat a healthy diet. This includes whole grains, dairy, fruits, vegetables, and protein. Eat foods from each of these groups.  ?? Try to lower your stress. Manage your time, build a strong support system, and lead a healthy lifestyle. To lower your stress, try physical activity, slow deep breathing, or getting a massage.  ?? Do not use alcohol or illegal drugs.  ?? Learn the early signs of your mood changes. You can then take steps to help yourself feel better.  ?? Ask for help from friends and family when you need it. You may need help with daily chores when you are depressed. When you are manic, you may need support to control your high energy levels.  What should you do if someone in your family has bipolar disorder?  ?? Learn about the disease and the signs that it is getting worse.  ?? Remind your family member that you love him or her.  ?? Make a plan with all family members about how to take care of your loved one when his or her symptoms are bad.  ?? Talk about your fears and concerns and those of other family members. Seek counseling if needed.  ?? Do not focus attention only on the person who is in treatment.  ?? Remind yourself that it will take time for changes to occur.  ?? Do not blame yourself for the disease.  ?? Know your legal rights and the legal rights of your family member. Support groups or counselors can help you with this information.  ?? Take care of yourself. Keep up with your own interests, such as your career,  hobbies, and friends. Use exercise, positive self-talk, deep breathing, and other relaxing exercises to help lower your stress.  ?? Give yourself time to grieve. You may need to deal with emotions such as anger, fear, and frustration. After you work through your feelings, you will be better able to care for yourself and your family.  ?? If you are having a hard time with your feelings or with your relationship with your family member, talk with a counselor.  When should you call for help?  Call 911 anytime you think you may need emergency care. For example, call if:  ?? You feel like hurting yourself or someone else.  ?? Someone who has bipolar disorder displays dangerous behavior, and you think the person might hurt himself or herself or someone else.  Call your doctor now or seek immediate medical care if:  ?? You hear voices.  ?? Someone you know has bipolar disorder and talks about suicide. Keep the numbers for these national suicide hotlines: 1-800-273-TALK (912)888-7630(1-870-253-0043) and 1-800-SUICIDE 716-313-6419(1-5705881901). If a suicide threat seems real, with a specific plan and a way to carry it out, stay with the person, or ask someone you trust to stay with the person, until you can get help.  ?? Someone you know has bipolar disorder and:  ??  Starts to give away possessions.  ?? Is using illegal drugs or drinking alcohol heavily.  ?? Talks or writes about death, including writing suicide notes or talking about guns, knives, or pills.  ?? Talks or writes about hurting someone else.  ?? Starts to spend a lot of time alone.  ?? Acts very aggressively or suddenly appears calm.  ?? Talks about beliefs that are not based in reality (delusions).  Watch closely for changes in your health, and be sure to contact your doctor if:  ?? You cannot go to your counseling sessions.   Where can you learn more?   Go to [facility]  Enter K052 in the search box to learn more about "Bipolar Disorder: After Your Visit."   ?? 2006-2015  Healthwise, Incorporated. Care instructions adapted under license by [employer] (which disclaims liability or warranty for this information). This care instruction is for use with your licensed healthcare professional. If you have questions about a medical condition or this instruction, always ask your healthcare professional. Healthwise, Incorporated disclaims any warranty or liability for your use of this information.  Content Version: 10.5.422740; Current as of: October 26, 2013

## 2014-05-30 NOTE — Progress Notes (Signed)
Alejandra Fletcher is a 38 y.o. female  Presents today for med refill for xanax and adderall.  1. Have you been to the ER, urgent care clinic since your last visit?  Hospitalized since your last visit?No    2. Have you seen or consulted any other health care providers outside of the [facility] since your last visit?  Include any pap smears or colon screening. Yes, Dr. Raul DelAlston for psych last seen on 05/16/2014.

## 2014-05-30 NOTE — Telephone Encounter (Signed)
LVM to patient. Called in regards to vit D level.

## 2014-05-30 NOTE — Telephone Encounter (Deleted)
-----   Message from Penelope GalasLynnette Moore, MD sent at 05/02/2014 10:14 PM EDT -----  Pt vitamin d is very low and weekly vitamin d is ordered for 12 weeks.after completions she is to have level rechecked.

## 2014-05-31 MED ORDER — LABETALOL 100 MG TAB
100 mg | ORAL_TABLET | Freq: Two times a day (BID) | ORAL | Status: DC
Start: 2014-05-31 — End: 2014-12-11

## 2014-05-31 NOTE — Telephone Encounter (Signed)
Requested Prescriptions     Pending Prescriptions Disp Refills   ??? Blood-Glucose Meter (FREESTYLE FREEDOM LITE) monitoring kit 1 Kit 0     Sig: by Does Not Apply route See Admin Instructions.   ??? glucose blood VI test strips (FREESTYLE LITE STRIPS) strip 25 Each 6     Sig: 25 Each by Does Not Apply route See Admin Instructions.   ??? Lancets (FREESTYLE LANCETS) misc 100 Each 3     Sig: 1 Package by Does Not Apply route See Admin Instructions.       Please review.

## 2014-05-31 NOTE — Progress Notes (Signed)
1. Have you been to the ER, urgent care clinic since your last visit?  Hospitalized since your last visit?No    2. Have you seen or consulted any other health care providers outside of the [facility] since your last visit?  Include any pap smears or colon screening. No     Alejandra Fletcher is a 38 y.o. female in to consult with Dr. Valda FaviaGarris who is the resident Hypertension specialist.

## 2014-05-31 NOTE — Patient Instructions (Signed)
Discontinue Micardis  Increase metformin to one tablet in the morning and  2 tablets in the evening For one week. Then increase metformin to 2 tablets twice daily from then on.  Increase Levemir to 42 units daily  Uses condoms and spermicide until alternative methods of contraception have been done.   Start labetalol immediately  Stop hydrochlorothiazide  Make appointment to see Dr. Christell ConstantMoore next week

## 2014-05-31 NOTE — Telephone Encounter (Signed)
Pt is in great deal of pain due to fibromyalgia and arthritis in back as well as a headache. Pt is requesting a medication to help with this.

## 2014-05-31 NOTE — Telephone Encounter (Signed)
Please pend glucometer and supplies

## 2014-05-31 NOTE — Progress Notes (Signed)
HISTORY OF PRESENT ILLNESS  Alejandra Fletcher is a 38 y.o. female.Being seen here today in consultation for diabetes and hypertension. She has a history of diabetes for 20 years. . It is unclear if she is type I or type II. However patient was obese at the time of diagnosis at age 38. She states fasting blood sugars are in the low 200s. Postprandial blood sugars are 250-300. A. She admits to noncompliance. She states that she usually checks her fasting blood sugars but doesn't always check postprandial blood sugars. When asked if the patient is trying to become pregnant she states that she is not avoiding pregnancy and would like to have another child.  Diabetes  The history is provided by the patient. This is a chronic problem. The problem has been gradually improving. Pertinent negatives include no chest pain and no shortness of breath. The symptoms are aggravated by eating. The symptoms are relieved by medications. Treatments tried: Insulin and metformin. The treatment provided moderate relief.   Hypertension   The history is provided by the patient. The problem has been gradually improving. Pertinent negatives include no chest pain, no blurred vision, no peripheral edema, no dizziness and no shortness of breath. There are no associated agents to hypertension. Risk factors include family history, dyslipidemia, diabetes mellitus and obesity.     Allergies   Allergen Reactions   ??? Topiramate Anxiety   ??? Ace Inhibitors Nausea and Vomiting   ??? Nsaids (Non-Steroidal Anti-Inflammatory Drug) Nausea and Vomiting     Current Outpatient Prescriptions   Medication Sig   ??? hydrochlorothiazide (HYDRODIURIL) 25 mg tablet Take 25 mg by mouth daily.   ??? labetalol (NORMODYNE) 100 mg tablet Take 1 Tab by mouth two (2) times a day.   ??? ALPRAZolam (XANAX) 1 mg tablet Take 1 Tab by mouth four (4) times daily as needed for Anxiety for up to 14 days. Max Daily Amount: 4 mg.   ??? dextroamphetamine-amphetamine (ADDERALL) 20 mg tablet  Take 1 Tab by mouth four (4) times daily as needed. Max Daily Amount: 80 mg.   ??? ergocalciferol (ERGOCALCIFEROL) 50,000 unit capsule Take 1 Cap by mouth every seven (7) days. Take for 12 weeks.   ??? ESOMEPRAZOLE MAGNESIUM (NEXIUM 24HR PO) Take 1 Cap by mouth. daily   ??? FLUoxetine (PROZAC) 40 mg capsule Take 40 mg by mouth daily.   ??? divalproex ER (DEPAKOTE ER) 500 mg ER tablet Take 500 mg by mouth daily.   ??? Insulin Needles, Disposable, (NANO PEN NEEDLE) 32 x 5/32 " ndle 1 Device.   ??? metFORMIN (GLUCOPHAGE) 500 mg tablet Take 1 Tab by mouth two (2) times daily (with meals). For glucose control   ??? telmisartan (MICARDIS) 20 mg tablet Take 1 Tab by mouth daily. For blood pressure control.   ??? insulin detemir (LEVEMIR) 100 unit/mL (3 mL) pen 37 units at night   ??? insulin aspart (NOVOLOG FLEXPEN) 100 unit/mL flexpen 0-150 = 0 units  151-200 = 2 units  201-250 = 4 units  251-300 = 6 units  301-350 = 8 units  351-400 = 10 units  401+ -> Come to the Emergency Department   ??? DULoxetine (CYMBALTA) 60 mg capsule Take 120 mg by mouth daily.   ??? HYDROcodone-acetaminophen (NORCO) 5-325 mg per tablet Take 1 Tablet PO every  8 hours as needed for pain control. Do not combine with other sedating or controlled medications. Avoid alcohol.     No current facility-administered medications for this visit.  Family History   Problem Relation Age of Onset   ??? Fainting Mother    ??? Hypertension Mother    ??? Asthma Mother    ??? Obesity Mother    ??? Diabetes Father    ??? Hypertension Father    ??? Gout Father    ??? Alcohol abuse Paternal Grandfather    ??? Other Paternal Grandfather      Nicotine abuse/ Bronchitis   ??? Heart Disease Paternal Grandmother    ??? Heart Surgery Paternal Grandmother    ??? Alzheimer Paternal Grandmother    ??? No Known Problems Sister    ??? No Known Problems Maternal Grandmother    ??? No Known Problems Maternal Grandfather      History     Social History   ??? Marital Status: MARRIED     Spouse Name: N/A     Number of Children: N/A    ??? Years of Education: N/A     Occupational History   ??? Not on file.     Social History Main Topics   ??? Smoking status: Never Smoker    ??? Smokeless tobacco: Never Used   ??? Alcohol Use: Yes      Comment: occasionally   ??? Drug Use: No   ??? Sexual Activity:     Partners: Male     Pharmacist, hospital Protection: None     Other Topics Concern   ??? Military Service No   ??? Blood Transfusions Yes     2004, May 10...after delivery of child   ??? Caffeine Concern No   ??? Occupational Exposure No   ??? Hobby Hazards No   ??? Sleep Concern Yes     Insomnia   ??? Stress Concern Yes     life stressors   ??? Weight Concern Yes     over weight   ??? Special Diet Yes     lifestyle, food changes   ??? Back Care Yes     PT for back problems   ??? Exercise No   ??? Bike Helmet No   ??? Seat Belt Yes   ??? Self-Exams Yes     Social History Narrative         Review of Systems   Constitutional: Negative.    Eyes: Negative for blurred vision.   Respiratory: Negative.  Negative for shortness of breath.    Cardiovascular: Negative for chest pain.   Neurological: Negative.  Negative for dizziness.   Endo/Heme/Allergies: Negative.    Psychiatric/Behavioral: Negative.      BP 120/92 mmHg   Pulse 93   Temp(Src) 96.9 ??F (36.1 ??C) (Oral)   Resp 20   Ht 5\' 2"  (1.575 m)   Wt 216 lb (97.977 kg)   BMI 39.50 kg/m2   SpO2 100%   LMP 05/14/2014    Physical Exam   Constitutional: She is oriented to person, place, and time. She appears well-developed and well-nourished.   HENT:   Head: Normocephalic and atraumatic.   Cardiovascular: Normal rate, regular rhythm, normal heart sounds and intact distal pulses.    Pulmonary/Chest: Effort normal and breath sounds normal.   Musculoskeletal: She exhibits no edema.   Neurological: She is alert and oriented to person, place, and time. No cranial nerve deficit.   Psychiatric: She has a normal mood and affect. Her behavior is normal. Judgment and thought content normal.   Nursing note and vitals reviewed.      ASSESSMENT and PLAN    ICD-9-CM  1.  Type 2 diabetes mellitus without complication (HCC) 250.00 ANTI GAD ANTIBODIES     C-PEPTIDE     FRUCTOSAMINE   2. Essential hypertension 401.9 labetalol (NORMODYNE) 100 mg tablet     Follow-up Disposition:  Return in about 2 weeks (around 06/14/2014).  the following changes in treatment are made: Patient is to stop micardis immediately as she is fertile and is not attempting to avoid pregnancy. We have had a long talk and patient understands that both  current hypertension medications And psychotropic medications can cause fetal harm. Patient expresses understanding this and agrees to using condoms and spermicide until further methods of contraception can be discussed with her husband and there are medications are changed by the psychiatrist's. I have changed her hypertensio.n medications the ones that are not dangerous during pregnancy. Patient is to followup with Dr. Christell ConstantMoore next week to discuss these matters further. Patient's metformin is titrated to 2 g daily and Levemir insulin is increased to 42 units daily. Patient has been instructed to Also stopped hydrochlorothiazide. This case has been discussed with risk management. Genia HaroldLisa Weber

## 2014-06-02 NOTE — Progress Notes (Signed)
HPI  Alejandra Fletcher is a 38 y.o. female that presents with  Chief Complaint   Patient presents with   ??? Medication Refill     Pt requesting refill of xanax and adderall. She reports she has been to therapist Dr. Raul Del June 4, but that psychiatry medications were not prescribed. Next appt with therapist is June 30. Pt was told to call to get an appt with the medication provider who had moved to a different office.  Pt reports she has not called yet. Pt previously had [place] of [place] psychiatry medications. Pt has h/o ADD, bipolar, anxiety, depression and indicates she needs her medication to remain stable . Pt denies be suicidal or having other dangerous thoughts.     She has been to dermatologist for prior scalp lump, which pt reports is now resolved. It was lanced and grew out staph infection. She continues with her insulin and metformin for diabetes. She reports her fasting today was 202.    Review of Systems:  Denies headaches, fevers, sob, chest pain, n/v diarreha.     Past Medical History   Diagnosis Date   ??? Gastric ulcer    ??? Gastrointestinal disorder      infections   ??? Diabetes (HCC)    ??? Fibromyalgia    ??? 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 GERD (gastroesophageal reflux disease)        Family History   Problem Relation Age of Onset   ??? Fainting Mother    ??? Hypertension Mother    ??? Asthma Mother    ??? Obesity Mother    ??? Diabetes Father    ??? Hypertension Father    ??? Gout Father    ??? Alcohol abuse Paternal Grandfather    ??? Other Paternal Grandfather      Nicotine abuse/ Bronchitis   ??? Heart Disease Paternal Grandmother    ??? Heart Surgery Paternal Grandmother    ??? Alzheimer Paternal Grandmother    ??? No Known Problems Sister    ??? No Known Problems Maternal Grandmother    ??? No Known Problems Maternal Grandfather        Past Surgical History   Procedure Laterality Date   ??? Hx cesarean section     ??? Hx gastric bypass      ??? Hx knee arthroscopy     ??? Hx cholecystectomy         History     Social History   ??? Marital Status: MARRIED     Spouse Name: N/A     Number of Children: N/A   ??? Years of Education: N/A     Occupational History   ??? Not on file.     Social History Main Topics   ??? Smoking status: Never Smoker    ??? Smokeless tobacco: Never Used   ??? Alcohol Use: Yes      Comment: occasionally   ??? Drug Use: No   ??? Sexual Activity:     Partners: Male     Pharmacist, hospital Protection: None     Other Topics Concern   ??? Military Service No   ??? Blood Transfusions Yes     2004, May 10...after delivery of child   ??? Caffeine Concern No   ??? Occupational Exposure No   ??? Hobby Hazards No   ??? Sleep Concern Yes     Insomnia   ???  Stress Concern Yes     life stressors   ??? Weight Concern Yes     over weight   ??? Special Diet Yes     lifestyle, food changes   ??? Back Care Yes     PT for back problems   ??? Exercise No   ??? Bike Helmet No   ??? Seat Belt Yes   ??? Self-Exams Yes     Social History Narrative       Outpatient Prescriptions Marked as Taking for the 05/30/14 encounter (Office Visit) with Cheri Rous, MD   Medication Sig Dispense Refill   ??? ALPRAZolam (XANAX) 1 mg tablet Take 1 Tab by mouth four (4) times daily as needed for Anxiety for up to 14 days. Max Daily Amount: 4 mg. 56 Tab 0   ??? dextroamphetamine-amphetamine (ADDERALL) 20 mg tablet Take 1 Tab by mouth four (4) times daily as needed. Max Daily Amount: 80 mg. 60 Tab 0   ??? ergocalciferol (ERGOCALCIFEROL) 50,000 unit capsule Take 1 Cap by mouth every seven (7) days. Take for 12 weeks. 12 Cap 0   ??? ESOMEPRAZOLE MAGNESIUM (NEXIUM 24HR PO) Take 1 Cap by mouth. daily     ??? FLUoxetine (PROZAC) 40 mg capsule Take 40 mg by mouth daily.     ??? divalproex ER (DEPAKOTE ER) 500 mg ER tablet Take 500 mg by mouth daily.     ??? Insulin Needles, Disposable, (NANO PEN NEEDLE) 32 x 5/32 " ndle 1 Device.     ??? metFORMIN (GLUCOPHAGE) 500 mg tablet Take 1 Tab by mouth two (2) times  daily (with meals). For glucose control 60 Tab 0   ??? telmisartan (MICARDIS) 20 mg tablet Take 1 Tab by mouth daily. For blood pressure control. 30 Tab 0   ??? insulin detemir (LEVEMIR) 100 unit/mL (3 mL) pen 37 units at night 1 Package 0   ??? insulin aspart (NOVOLOG FLEXPEN) 100 unit/mL flexpen 0-150 = 0 units  151-200 = 2 units  201-250 = 4 units  251-300 = 6 units  301-350 = 8 units  351-400 = 10 units  401+ -> Come to the Emergency Department 1 Package 0   ??? DULoxetine (CYMBALTA) 60 mg capsule Take 120 mg by mouth daily.         Allergies   Allergen Reactions   ??? Topiramate Anxiety   ??? Ace Inhibitors Nausea and Vomiting   ??? Nsaids (Non-Steroidal Anti-Inflammatory Drug) Nausea and Vomiting       Filed Vitals:    05/30/14 1330 05/30/14 1511   BP: 125/81    Pulse: 110 104   Temp: 98 ??F (36.7 ??C)    TempSrc: Oral    Resp: 18    Height: 5' 2.01" (1.575 m)    Weight: 216 lb (97.977 kg)    SpO2: 98%          Physical Examination: General appearance - alert, well appearing, and in no distress    Mental status - alert, oriented to person, place, and time; good eye contact; normal behavior  Eyes - pupils equal and reactive, extraocular eye movements intact; no redness, drainage, or jaundice  Nose - normal and patent, no erythema or discharge    Mouth - mucous membranes moist, pharynx normal without lesions; no quinsy, stridor, swelling, or drooling   Neck - supple, no significant adenopathy  PULM: clear to auscultation, no wheezes, rales or rhonchi, symmetric air entry  CVS - normal rate, regular rhythm, normal S1, S2,  no murmurs, rubs, clicks or gallops  GI - soft, nontender, nondistended, no masses or organomegaly   Neurological - alert, oriented, normal speech, no focal findings or movement disorder noted   Extremities - no edema       LABS/IMAGING:  Office Visit on 05/15/2014   Component Date Value Ref Range Status   ??? Summary 05/15/2014 FINAL   Final    Comment: Test                         Result       Flag       UNITS   ====================================================================  COMPLIANCE DRUG ANALYSIS,URINE,WITH MED REPORT    Creatinine                 208                     mg/dL                                                       Ref Range>=20  Drugs Present    Amphetamine                >7212                   ng/mg creat      Amphetamine is available as a schedule II prescription         drug.    Oxazepam                   1200                    ng/mg creat    Temazepam                  128                     ng/mg creat     Oxazepam and temazepam are benzodiazepine drugs, but may        also be present as common metabolites of other        benzodiazepine drugs, including diazepam and temazepam.    Alprazolam                 73                      ng/mg creat    Alpha-hydroxyalprazolam    437                     ng/mg creat      Source of alprazolam is a scheduled prescription         medication. Alpha-hydroxyalprazolam                            is an expected         metabolite of alprazolam.    Ephedrine/Pseudoephedrine  PRESENT    Phenylpropanolamine        PRESENT     Source of ephedrine/pseudoephedrine is most commonly        pseudoephedrine in over-the-counter or prescription cold        and allergy medications. Phenylpropanolamine is  an        expected metabolite of ephedrine/pseudoephedrine.    Duloxetine                 PRESENT    Fluoxetine                 PRESENT    Norfluoxetine              PRESENT     Norfluoxetine is an expected metabolite of fluoxetine.    Acetaminophen              PRESENT    Salicylate                 PRESENT    Naproxen                   PRESENT    Diphenhydramine            PRESENT    Declared Medications:      Not Provided     Medication list was not provided.              For clinical consultation, please call                        [phone].     Office Visit on 04/23/2014   Component Date Value Ref Range Status   ??? Microalbumin urine (POC) 04/23/2014 150   Final     CRE 200 mg/dL   ??? Microalbumin/creat ratio (POC) 04/23/2014 30-300   Final    Abnormal   ??? WBC 04/23/2014 9.8  3.4 - 10.8 x10E3/uL Final   ??? RBC 04/23/2014 4.24  3.77 - 5.28 x10E6/uL Final   ??? HGB 04/23/2014 11.5  11.1 - 15.9 g/dL Final   ??? HCT 04/23/2014 35.7  34.0 - 46.6 % Final   ??? MCV 04/23/2014 84  79 - 97 fL Final   ??? MCH 04/23/2014 27.1  26.6 - 33.0 pg Final   ??? MCHC 04/23/2014 32.2  31.5 - 35.7 g/dL Final   ??? RDW 04/23/2014 16.8* 12.3 - 15.4 % Final   ??? PLATELET 04/23/2014 321  150 - 379 x10E3/uL Final   ??? NEUTROPHILS 04/23/2014 65   Final   ??? Lymphocytes 04/23/2014 27   Final   ??? MONOCYTES 04/23/2014 7   Final   ??? EOSINOPHILS 04/23/2014 1   Final   ??? BASOPHILS 04/23/2014 0   Final   ??? ABS. NEUTROPHILS 04/23/2014 6.3  1.4 - 7.0 x10E3/uL Final   ??? Abs Lymphocytes 04/23/2014 2.7  0.7 - 3.1 x10E3/uL Final   ??? ABS. MONOCYTES 04/23/2014 0.7  0.1 - 0.9 x10E3/uL Final   ??? ABS. EOSINOPHILS 04/23/2014 0.1  0.0 - 0.4 x10E3/uL Final   ??? ABS. BASOPHILS 04/23/2014 0.0  0.0 - 0.2 x10E3/uL Final   ??? IMMATURE GRANULOCYTES 04/23/2014 0   Final   ??? ABS. IMM. GRANS. 04/23/2014 0.0  0.0 - 0.1 x10E3/uL Final   ??? Glucose 04/23/2014 268* 65 - 99 mg/dL Final   ??? BUN 04/23/2014 10  6 - 20 mg/dL Final   ??? Creatinine 04/23/2014 0.66  0.57 - 1.00 mg/dL Final   ??? GFR est non-AA 04/23/2014 114  >59 mL/min/1.73 Final   ??? GFR est AA 04/23/2014 131  >59 mL/min/1.73 Final   ??? BUN/Creatinine ratio 04/23/2014 15  8 - 20 Final   ??? Sodium 04/23/2014 140  134 - 144 mmol/L Final   ???  Potassium 04/23/2014 3.8  3.5 - 5.2 mmol/L Final   ??? Chloride 04/23/2014 96* 97 - 108 mmol/L Final   ??? CO2 04/23/2014 23  18 - 29 mmol/L Final   ??? Calcium 04/23/2014 8.6* 8.7 - 10.2 mg/dL Final   ??? Protein, total 04/23/2014 6.6  6.0 - 8.5 g/dL Final   ??? Albumin 04/23/2014 3.8  3.5 - 5.5 g/dL Final   ??? GLOBULIN, TOTAL 04/23/2014 2.8  1.5 - 4.5 g/dL Final   ??? A-G Ratio 04/23/2014 1.4  1.1 - 2.5 Final   ??? Bilirubin, total 04/23/2014 0.2  0.0 - 1.2 mg/dL Final    ??? Alk. phosphatase 04/23/2014 136* 39 - 117 IU/L Final   ??? AST 04/23/2014 26  0 - 40 IU/L Final   ??? ALT 04/23/2014 27  0 - 32 IU/L Final   ??? Cholesterol, total 04/23/2014 186  100 - 199 mg/dL Final   ??? Triglyceride 04/23/2014 137  0 - 149 mg/dL Final   ??? HDL Cholesterol 04/23/2014 79  >39 mg/dL Final    Comment: According to ATP-III Guidelines, HDL-C >59 mg/dL is considered a  negative risk factor for CHD.     ??? VLDL, calculated 04/23/2014 27  5 - 40 mg/dL Final   ??? LDL, calculated 04/23/2014 80  0 - 99 mg/dL Final   ??? VITAMIN D, 25-HYDROXY 04/23/2014 4.4* 30.0 - 100.0 ng/mL Final    Comment: Vitamin D deficiency has been defined by the [employer] practice guideline as a  level of serum 25-OH vitamin D less than 20 ng/mL (1,2).  The Endocrine Society went on to further define vitamin D  insufficiency as a level between 21 and 29 ng/mL (2).  1. IOM (Institute of Medicine). 2010. Dietary reference     intakes for calcium and D. [place]: The     [employer].  2. Holick MF, Binkley NC, Bischoff-Ferrari HA, et al.     Evaluation, treatment, and prevention of vitamin D     deficiency: an Endocrine Society clinical practice     guideline. JCEM. 2011 Jul; 96(7):1911-30.     ??? TSH-ICMA 04/23/2014 0.78   Final    Comment: Reference Range:  Pubertal Children and Adults: 0.5 - 4.8     ??? Free T4 04/23/2014 0.90   Final    Comment: Reference Range:  Pubertal Children  and Adults:          0.8 - 1.7  Pregnant Females  1st Trimester  (0-13.3 wks):       0.65 - 1.4  2nd Trimester  (13.4-26.6 wks):     0.5 - 1.3  3rd Trimester  (>26.6 wks):         0.5 - 1.1     ??? Hemoglobin A1c (POC) 04/23/2014 14.6   Final   ??? Glucose dose-GTT 04/23/2014 280   Final   ??? Fungus Stain 04/23/2014 KOH/Calcofluor preparation:  no fungus observed.   Final   ??? Glutamic Acid Decarboxylase Ab 04/23/2014 <0.5   Final    Comment: Reference Range:  <0.5        Negative  > or = 0.5  Positive      ??? INTERPRETATION 04/23/2014 Note   Final    Supplement report is available.   Admission on 04/19/2014, Discharged on 04/19/2014   Component Date Value Ref Range Status   ??? Glucose (POC) 04/19/2014 309* 70 - 110 mg/dL Final    Comment: (NOTE)  The FDA has indicated that no capillary point of care blood glucose   monitoring systems are approved for use in "critically ill" patients,   however they have not defined this population. The College of   American Pathologists has recommended that these devices should not   be used in cases such as severe hypotension, dehydration, shock, and   hyperglycemic-hyperosmolar state, amongst others.  Venous or arterial   collection is the recommended specimen for testing these patients.     ??? Color 04/19/2014 YELLOW   Final   ??? Appearance 04/19/2014 CLEAR   Final   ??? Specific gravity 04/19/2014 1.010  1.003 - 1.030   Final   ??? pH (UA) 04/19/2014 6.0  5.0 - 8.0   Final   ??? Protein 04/19/2014 NEGATIVE   NEG mg/dL Final   ??? Glucose 04/19/2014 >1000* NEG mg/dL Final   ??? Ketone 04/19/2014 NEGATIVE   NEG mg/dL Final   ??? Bilirubin 04/19/2014 NEGATIVE   NEG   Final   ??? Blood 04/19/2014 NEGATIVE   NEG   Final   ??? Urobilinogen 04/19/2014 0.2  0.2 - 1.0 EU/dL Final   ??? Nitrites 04/19/2014 NEGATIVE   NEG   Final   ??? Leukocyte Esterase 04/19/2014 NEGATIVE   NEG   Final   ??? Special Requests: 04/19/2014 NO SPECIAL REQUESTS   Final   ??? Culture result: 04/19/2014    Final                    Value:80000  COLONIES/mL  ESCHERICHIA COLI     ??? Culture result: 04/19/2014    Final                    Value:80000  ESCHERICHIA COLI  2ND MORPHOTYPE     ??? Culture result: 04/19/2014    Final                    Value:60000  COLONIES/mL  MIXED GRAM POSITIVE FLORA, PROBABLE SKIN/GENITAL CONTAMINATION.     ??? HCG urine, Ql. 04/19/2014 NEGATIVE   NEG   Final    Test results should be confirmed using serum quantitative hCG when detection of pregnancy is critical and before performing any critical medical procedure.        Assessment/ Plan    Alejandra Fletcher was seen today for medication refill.    Diagnoses and associated orders for this visit:    Attention deficit disorder  - dextroamphetamine-amphetamine (ADDERALL) 20 mg tablet; Take 1 Tab by mouth four (4) times daily as needed. Max Daily Amount: 80 mg.    Anxiety  - ALPRAZolam (XANAX) 1 mg tablet; Take 1 Tab by mouth four (4) times daily as needed for Anxiety for up to 14 days. Max Daily Amount: 4 mg.    Bipolar 1 disorder ([place])    Medication refill:  Pt was asked to call medication provider for appt for [facility] , while I waited in the room and next avail appt was September. Pt was advised she will be given contact information for other psychiatry providers to get sooner appt, as i feel all her psychotropic medications need to be dispensed by psychiatrist. She is given limited supply of Adderall and xanax. PMP profile reviewed; no red flags.    Type 2 diabetes mellitus without complication [facility]): diabetes consult with Dr. Ellison Hughs tomorrow.      Follow-up Disposition:  Return in about 1 month (around 06/29/2014), or if symptoms worsen or  fail to improve.  I have reviewed return/Er/medication precautions and  discussed the diagnosis with the patient (or guardian/parent if applicable)  and the intended plan as seen in the above orders.  The patient has been offered and/or received an after-visit summary and questions were answered concerning future plans. Patient verbalized understanding of all instruction.    Medication Side Effects and Warnings were discussed with patient: yes  Patient Labs were reviewed: yes      Jeannine Boga, MD

## 2014-06-06 LAB — ANTI GAD ANTIBODIES: Glutamic Acid Decarboxylase Ab: <0.5 U/mL

## 2014-06-06 LAB — C-PEPTIDE
C-Peptide: 1.3 ng/mL (ref 1.1–4.4)
C-Peptide: 1.3 ng/mL (ref 1.1–4.4)

## 2014-06-06 LAB — FRUCTOSAMINE: Fructosamine: 427 umol/L — ABNORMAL HIGH (ref 0–285)

## 2014-06-07 NOTE — Telephone Encounter (Signed)
Requested Prescriptions     Pending Prescriptions Disp Refills   ??? Blood-Glucose Meter (FREESTYLE FREEDOM LITE) monitoring kit 1 Kit 0     Sig: Use to test blood sugars four times a day.   ??? glucose blood VI test strips (FREESTYLE LITE STRIPS) strip 150 Each 6     Sig: Use to test Blood Sugars four times a day.   ??? Lancets (FREESTYLE LANCETS) misc 150 Each 6     Sig: Use to test Blood Sugars four times a day.       Please review.

## 2014-06-07 NOTE — Telephone Encounter (Signed)
Dr. Ellison Hughs, please refill the following pended medications, thank you,    Requested Prescriptions     Pending Prescriptions Disp Refills   ??? Blood-Glucose Meter (FREESTYLE FREEDOM LITE) monitoring kit 1 Kit 0     Sig: by Does Not Apply route See Admin Instructions.   ??? glucose blood VI test strips (FREESTYLE LITE STRIPS) strip 100 Each 6     Sig: 25 Each by Does Not Apply route See Admin Instructions.   ??? Lancets (FREESTYLE LANCETS) misc 100 Each 3     Sig: 1 Package by Does Not Apply route See Admin Instructions.

## 2014-06-10 MED ORDER — LANCETS
Status: AC
Start: 2014-06-10 — End: ?

## 2014-06-10 MED ORDER — BLOOD SUGAR DIAGNOSTIC TEST STRIPS
Status: AC
Start: 2014-06-10 — End: ?

## 2014-06-10 MED ORDER — BLOOD GLUCOSE METER KIT
PACK | Status: AC
Start: 2014-06-10 — End: ?

## 2014-06-10 NOTE — Telephone Encounter (Signed)
Dr Laurance Flatten,    Please be advise the patient is requesting the following supplies   Requested Prescriptions     Pending Prescriptions Disp Refills   ??? Blood-Glucose Meter (FREESTYLE FREEDOM LITE) monitoring kit 1 Kit 0     Sig: Use to test blood sugars four times a day.   ??? glucose blood VI test strips (FREESTYLE LITE STRIPS) strip 150 Each 6     Sig: Use to test Blood Sugars four times a day.   ??? Lancets (FREESTYLE LANCETS) misc 150 Each 6     Sig: Use to test Blood Sugars four times a day.

## 2014-06-12 NOTE — Telephone Encounter (Signed)
Regarding below stated information. Pt advised of vit D level while in office. Closing encounter.

## 2014-06-13 NOTE — Telephone Encounter (Signed)
This is Dr. Kathi DerMoore's patient

## 2014-06-13 NOTE — Telephone Encounter (Signed)
Pt calling to inform dr. Christell ConstantMoore than she got an earlier appt for 9th of July for medication evaluation with [facility]. She is requesting refill on the following medications until she sees [facility] because she is out. She is also requesting for one month, if not one month, she is okay with two weeks. I suggested to make an appt with dr. Christell ConstantMoore to fill those meds but she refused due to cost of copay. Pt is aware it can take 72 hrs for this to be taken care of, but pt is completely out of these medications. Please advise.      ALPRAZolam (XANAX) 1 mg tablet - completely out  dextroamphetamine-amphetamine (ADDERALL) 20 mg tablet - completely out

## 2014-06-15 LAB — AMB POC URINE PREGNANCY TEST, VISUAL COLOR COMPARISON: HCG urine, Ql. (POC): NEGATIVE

## 2014-06-15 MED ORDER — ALPRAZOLAM 1 MG TAB
1 mg | ORAL_TABLET | Freq: Four times a day (QID) | ORAL | Status: DC | PRN
Start: 2014-06-15 — End: 2015-09-30

## 2014-06-15 MED ORDER — AMPHETAMINE-DEXTROAMPHETAMINE 20 MG TAB
20 mg | ORAL_TABLET | Freq: Four times a day (QID) | ORAL | Status: DC
Start: 2014-06-15 — End: 2015-09-30

## 2014-06-15 NOTE — Progress Notes (Signed)
HPI  Alejandra Fletcher is a 38 y.o. female that presents with  Chief Complaint   Patient presents with   ??? Medication Refill   ??? Medication Evaluation     Pt requesting refill of xanax and adderall until her July 9th appt with her first psychiatry medication appt through [facility]. She has no dangerous thoughts.     I have inquired about her recent appt with Dr. Ellison Hughs, for her diabetes/bp and she reports that she has continued the micardis, because she wanted to use up the supply before starting the new medication labetolol, which she plans on starting next week. In regards to her birth control, she reports that she is sexually active and is not using any protection and prefers not to use birth control pills. LmP was mid June..     Review of Systems:  ROS:    No fevers, chills, sob, chest pain, n/v.     Past Medical History   Diagnosis Date   ??? Gastric ulcer    ??? Gastrointestinal disorder      infections   ??? Diabetes ([place])    ??? Fibromyalgia    ??? 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 GERD (gastroesophageal reflux disease)    ??? Bipolar affective disorder ([place])        Family History   Problem Relation Age of Onset   ??? Fainting Mother    ??? Hypertension Mother    ??? Asthma Mother    ??? Obesity Mother    ??? Diabetes Father    ??? Hypertension Father    ??? Gout Father    ??? Alcohol abuse Paternal Grandfather    ??? Other Paternal Grandfather      Nicotine abuse/ Bronchitis   ??? Heart Disease Paternal Grandmother    ??? Heart Surgery Paternal Grandmother    ??? Alzheimer Paternal Grandmother    ??? No Known Problems Sister    ??? No Known Problems Maternal Grandmother    ??? No Known Problems Maternal Grandfather        Past Surgical History   Procedure Laterality Date   ??? Hx cesarean section     ??? Hx gastric bypass     ??? Hx knee arthroscopy     ??? Hx cholecystectomy         History     Social History   ??? Marital Status: MARRIED     Spouse Name: N/A     Number of Children: N/A    ??? Years of Education: N/A     Occupational History   ??? Not on file.     Social History Main Topics   ??? Smoking status: Never Smoker    ??? Smokeless tobacco: Never Used   ??? Alcohol Use: Yes      Comment: occasionally   ??? Drug Use: No   ??? Sexual Activity:     Partners: Male     Patent examiner Protection: None     Other Topics Concern   ??? Military Service No   ??? Blood Transfusions Yes     2004, May 10...after delivery of child   ??? Caffeine Concern No   ??? Occupational Exposure No   ??? Hobby Hazards No   ??? Sleep Concern Yes     Insomnia   ??? Stress Concern Yes     life stressors   ??? Weight Concern Yes     over weight   ???  Special Diet Yes     lifestyle, food changes   ??? Back Care Yes     PT for back problems   ??? Exercise No   ??? Bike Helmet No   ??? Seat Belt Yes   ??? Self-Exams Yes     Social History Narrative       Outpatient Prescriptions Marked as Taking for the 06/15/14 encounter (Office Visit) with Cheri Rous, MD   Medication Sig Dispense Refill   ??? dextroamphetamine-amphetamine (ADDERALL) 20 mg tablet Take 1 Tab by mouth four (4) times daily. Max Daily Amount: 80 mg. 28 Tab 0   ??? ALPRAZolam (XANAX) 1 mg tablet Take 1 Tab by mouth four (4) times daily as needed for Anxiety. Max Daily Amount: 4 mg. Drowsiness precautions. Avoid alcohol. 28 Tab 0   ??? Blood-Glucose Meter (FREESTYLE FREEDOM LITE) monitoring kit Use to test blood sugars four times a day. 1 Kit 0   ??? glucose blood VI test strips (FREESTYLE LITE STRIPS) strip Use to test Blood Sugars four times a day. 150 Each 6   ??? Lancets (FREESTYLE LANCETS) misc Use to test Blood Sugars four times a day. 150 Each 6   ??? hydrochlorothiazide (HYDRODIURIL) 25 mg tablet Take 25 mg by mouth daily.     ??? ESOMEPRAZOLE MAGNESIUM (NEXIUM 24HR PO) Take 1 Cap by mouth. daily     ??? FLUoxetine (PROZAC) 40 mg capsule Take 40 mg by mouth daily.     ??? divalproex ER (DEPAKOTE ER) 500 mg ER tablet Take 500 mg by mouth two (2) times a day.      ??? Insulin Needles, Disposable, (NANO PEN NEEDLE) 32 x 5/32 " ndle 1 Device.     ??? metFORMIN (GLUCOPHAGE) 500 mg tablet Take 1 Tab by mouth two (2) times daily (with meals). For glucose control 60 Tab 0   ??? telmisartan (MICARDIS) 20 mg tablet Take 1 Tab by mouth daily. For blood pressure control. 30 Tab 0   ??? insulin detemir (LEVEMIR) 100 unit/mL (3 mL) pen 37 units at night 1 Package 0   ??? insulin aspart (NOVOLOG FLEXPEN) 100 unit/mL flexpen 0-150 = 0 units  151-200 = 2 units  201-250 = 4 units  251-300 = 6 units  301-350 = 8 units  351-400 = 10 units  401+ -> Come to the Emergency Department 1 Package 0   ??? DULoxetine (CYMBALTA) 60 mg capsule Take 120 mg by mouth two (2) times a day.         Allergies   Allergen Reactions   ??? Topiramate Anxiety   ??? Ace Inhibitors Nausea and Vomiting   ??? Nsaids (Non-Steroidal Anti-Inflammatory Drug) Nausea and Vomiting       Filed Vitals:    06/15/14 1441   BP: 164/94   Pulse: 99   Temp: 97.8 ??F (36.6 ??C)   TempSrc: Oral   Resp: 16   Height: 5' 1.5" (1.562 m)   Weight: 217 lb 6.4 oz (98.612 kg)   SpO2: 99%       Physical Examination: General appearance - alert, well appearing, and in no distress    Mental status - alert, oriented to person, place, and time  Nose - normal and patent  PULM: clear to auscultation, no wheezes, rales or rhonchi, symmetric air entry  CVS - normal rate, regular rhythm, normal S1, S2, no murmurs, rubs, clicks or gallops  GI - soft, nontender, nondistended, no masses or organomegaly   Neurological - alert, oriented, normal  speech, no focal findings or movement disorder noted   Extremities - no ankle edema      LABS/IMAGING:  Negative urine hcg today    Assessment/ Plan    Diagnoses and associated orders for this visit:    Attention deficit disorder: Pmp profile reviewed; no red flags noted. 1 week supply. She is advised to keep appt with psychiatry medication provider for any additional mental health medications. She verbalized understanding.    - dextroamphetamine-amphetamine (ADDERALL) 20 mg tablet; Take 1 Tab by mouth four (4) times daily. Max Daily Amount: 80 mg.    Bipolar disorder without psychotic features ([place])   - ALPRAZolam (XANAX) 1 mg tablet; Take 1 Tab by mouth four (4) times daily as needed for Anxiety. Max Daily Amount: 4 mg. Drowsiness precautions. Avoid alcohol.    Anxiety:   1 week supply  - ALPRAZolam (XANAX) 1 mg tablet; Take 1 Tab by mouth four (4) times daily as needed for Anxiety. Max Daily Amount: 4 mg. Drowsiness precautions. Avoid alcohol.    Essential hypertension: she will be stopping micardis , and  starting labetalol as prescribed    Type 2 diabetes mellitus without complication ([place]):  Pt is advised that medication choices for diabetes control may be limited if she not on contraception.     Other general counseling and advice for contraceptive management: discussed importance of avoiding pregnancy while on potentially teratogenic meds (depakote, micardis) and that other meds such as adderall, xanax, etc are not advisable if she were to conceive. She is advised gynecologist referral for further evaluation.    - REFERRAL TO GYNECOLOGY  - AMB POC URINE PREGNANCY TEST, VISUAL COLOR COMPARISON    Screening  - AMB POC URINE PREGNANCY TEST, VISUAL COLOR COMPARISON        Follow-up Disposition:  Return in about 1 month (around 07/16/2014), or if symptoms worsen or fail to improve.  I have reviewed return/Er/medication precautions and  discussed the diagnosis with the patient (or guardian/parent if applicable)  and the intended plan as seen in the above orders.  The patient has been offered and/or received an after-visit summary and questions were answered concerning future plans. Patient verbalized understanding of all instruction.    Medication Side Effects and Warnings were discussed with patient: yes      Jeannine Boga, MD

## 2014-06-15 NOTE — Telephone Encounter (Signed)
Pt calling again for status of refill. [facility]

## 2014-06-15 NOTE — Patient Instructions (Signed)
Attention Deficit Hyperactivity Disorder (ADHD) in Adults: After Your Visit  Your Care Instructions  Attention deficit hyperactivity disorder, or ADHD, is a condition that makes it hard to pay attention. So you may have problems when you try to focus, get organized, and finish tasks. It might make you more active than other people. Or you might do things without thinking first.  ADHD is very common. It usually starts in early childhood. Many adults don't realize they have it until their children are diagnosed. Then they become aware of their own symptoms.  Doctors don't know what causes ADHD. But it often runs in families.  ADHD can be treated with medicines, behavior training, and counseling. Treatment can improve your life.  Follow-up care is a key part of your treatment and safety. Be sure to make and go to all appointments, and call your doctor if you are having problems. It's also a good idea to know your test results and keep a list of the medicines you take.  How can you care for yourself at home?  ?? Learn all you can about ADHD. This will help you and your family understand it better.  ?? Take your medicines exactly as prescribed. Call your doctor if you think you are having a problem with your medicine. You will get more details on the specific medicines your doctor prescribes.  ?? If you miss a dose of your medicine, do not take an extra dose.  ?? If your doctor suggests counseling, find a counselor you like and trust. Talk openly and honestly. Be willing to make some changes.  ?? Find a support group for adults with ADHD. Talking to others with the same problems can help you feel better. It can also give you ideas about how to best cope with the condition.  ?? Get rid of distractions at your work space. Keep your desk clean. Try not to face a window or busy hallway.  ?? Use files, planners, and other tools to keep you organized.  ?? Limit use of alcohol, and do not use illegal drugs. People with ADHD  tend to become addicted more easily than others. Tell your doctor if you need help to quit. Counseling, support groups, and sometimes medicines can help you stay free of alcohol or drugs.  ?? Get at least 30 minutes of physical activity on most days of the week. Exercise has been shown to help people cope with ADHD. Walking is a good choice. You also may want to do other activities, such as running, swimming, cycling, or playing tennis or team sports.  When should you call for help?  Watch closely for changes in your health, and be sure to contact your doctor if:  ?? You feel sad a lot or cry all the time.  ?? You have trouble sleeping, or you sleep too much.  ?? You find it hard to concentrate, make decisions, or remember things.  ?? You change how you normally eat.  ?? You feel guilty for no reason.   Where can you learn more?   Go to http://www.healthwise.net/[facility]  Enter B196 in the search box to learn more about "Attention Deficit Hyperactivity Disorder (ADHD) in Adults: After Your Visit."   ?? 2006-2015 Healthwise, Incorporated. Care instructions adapted under license by [employer] (which disclaims liability or warranty for this information). This care instruction is for use with your licensed healthcare professional. If you have questions about a medical condition or this instruction, always ask your healthcare professional. Healthwise,   Incorporated disclaims any warranty or liability for your use of this information.  Content Version: 10.5.422740; Current as of: October 26, 2013              Anxiety Disorder: After Your Visit  Your Care Instructions  Anxiety is a normal reaction to stress. Difficult situations can cause you to have symptoms such as sweaty palms and a nervous feeling.  In an anxiety disorder, the symptoms are far more severe. Constant worry, muscle tension, trouble sleeping, nausea and diarrhea, and other symptoms can make normal daily activities difficult or impossible. These symptoms  may occur for no reason, and they can affect your work, school, or social life. Medicines, counseling, and self-care can all help.  Follow-up care is a key part of your treatment and safety. Be sure to make and go to all appointments, and call your doctor if you are having problems. It's also a good idea to know your test results and keep a list of the medicines you take.  How can you care for yourself at home?  ?? Take medicines exactly as directed. Call your doctor if you think you are having a problem with your medicine.  ?? Go to your counseling sessions and follow-up appointments.  ?? Recognize and accept your anxiety. Then, when you are in a situation that makes you anxious, say to yourself, "This is not an emergency. I feel uncomfortable, but I am not in danger. I can keep going even if I feel anxious."  ?? Be kind to your body:  ?? Relieve tension with exercise or a massage.  ?? Get enough rest.  ?? Avoid alcohol, caffeine, nicotine, and illegal drugs. They can increase your anxiety level and cause sleep problems.  ?? Learn and do relaxation techniques. See below for more about these techniques.  ?? Engage your mind. Get out and do something you enjoy. Go to a funny movie, or take a walk or hike. Plan your day. Having too much or too little to do can make you anxious.  ?? Keep a record of your symptoms. Discuss your fears with a good friend or family member, or join a support group for people with similar problems. Talking to others sometimes relieves stress.  ?? Get involved in social groups, or volunteer to help others. Being alone sometimes makes things seem worse than they are.  ?? Get at least 30 minutes of exercise on most days of the week to relieve stress. Walking is a good choice. You also may want to do other activities, such as running, swimming, cycling, or playing tennis or team sports.  Relaxation techniques  Do relaxation exercises 10 to 20 minutes a day. You can play soothing,  relaxing music while you do them, if you wish.  ?? Tell others in your house that you are going to do your relaxation exercises. Ask them not to disturb you.  ?? Find a comfortable place, away from all distractions and noise.  ?? Lie down on your back, or sit with your back straight.  ?? Focus on your breathing. Make it slow and steady.  ?? Breathe in through your nose. Breathe out through either your nose or mouth.  ?? Breathe deeply, filling up the area between your navel and your rib cage. Breathe so that your belly goes up and down.  ?? Do not hold your breath.  ?? Breathe like this for 5 to 10 minutes. Notice the feeling of calmness throughout your whole body.  As you  continue to breathe slowly and deeply, relax by doing the following for another 5 to 10 minutes:  ?? Tighten and relax each muscle group in your body. You can begin at your toes and work your way up to your head.  ?? Imagine your muscle groups relaxing and becoming heavy.  ?? Empty your mind of all thoughts.  ?? Let yourself relax more and more deeply.  ?? Become aware of the state of calmness that surrounds you.  ?? When your relaxation time is over, you can bring yourself back to alertness by moving your fingers and toes and then your hands and feet and then stretching and moving your entire body. Sometimes people fall asleep during relaxation, but they usually wake up shortly afterward.  ?? Always give yourself time to return to full alertness before you drive a car or do anything that might cause an accident if you are not fully alert. Never play a relaxation tape while you drive a car.  When should you call for help?  Call 911 anytime you think you may need emergency care. For example, call if:  ?? You feel you cannot stop from hurting yourself or someone else.  Watch closely for changes in your health, and be sure to contact your doctor if:  ?? You have anxiety or fear that affects your life.   ?? You have symptoms of anxiety that are new or different from those you had before.   Where can you learn more?   Go to [facility]  Enter P754 in the search box to learn more about "Anxiety Disorder: After Your Visit."   ?? 2006-2015 Healthwise, Incorporated. Care instructions adapted under license by [employer] (which disclaims liability or warranty for this information). This care instruction is for use with your licensed healthcare professional. If you have questions about a medical condition or this instruction, always ask your healthcare professional. Healthwise, Incorporated disclaims any warranty or liability for your use of this information.  Content Version: 10.5.422740; Current as of: October 26, 2013              Bipolar Disorder: After Your Visit  Your Care Instructions  Bipolar disorder is an illness that causes extreme mood changes, from times of very high energy (manic episodes) to times of depression. But many people with bipolar disorder show only the symptoms of depression. These moods may cause problems with your work, school, family life, friendships, and how well you function.  This disease is also called manic-depression.  There is no cure for bipolar disorder, but it can be helped with medicines. Counseling may also help. It is important to take your medicines exactly as prescribed, even when you feel well. You may need lifelong treatment.  Follow-up care is a key part of your treatment and safety. Be sure to make and go to all appointments, and call your doctor if you are having problems. It's also a good idea to know your test results and keep a list of the medicines you take.  How can you care for yourself at home?  ?? Be safe with medicines. Take your medicines exactly as prescribed. Do not stop or change a medicine without talking to your doctor first. You and your doctor may need to try different combinations of medicines to find what works for you.   ?? Take your medicines on schedule to keep your moods even. When you feel good, you may think that you do not need your medicines. But it is  important to keep taking them.  ?? Go to your counseling sessions. Call and talk with your counselor if you can't go to a session or if you don't think the sessions are helping. Do not just stop going.  ?? Get at least 30 minutes of activity on most days of the week. Walking is a good choice. You also may want to do other things, such as running, swimming, or cycling.  ?? Get enough sleep. Keep your room dark and quiet. Try to go to bed at the same time every night.  ?? Eat a healthy diet. This includes whole grains, dairy, fruits, vegetables, and protein. Eat foods from each of these groups.  ?? Try to lower your stress. Manage your time, build a strong support system, and lead a healthy lifestyle. To lower your stress, try physical activity, slow deep breathing, or getting a massage.  ?? Do not use alcohol or illegal drugs.  ?? Learn the early signs of your mood changes. You can then take steps to help yourself feel better.  ?? Ask for help from friends and family when you need it. You may need help with daily chores when you are depressed. When you are manic, you may need support to control your high energy levels.  What should you do if someone in your family has bipolar disorder?  ?? Learn about the disease and the signs that it is getting worse.  ?? Remind your family member that you love him or her.  ?? Make a plan with all family members about how to take care of your loved one when his or her symptoms are bad.  ?? Talk about your fears and concerns and those of other family members. Seek counseling if needed.  ?? Do not focus attention only on the person who is in treatment.  ?? Remind yourself that it will take time for changes to occur.  ?? Do not blame yourself for the disease.  ?? Know your legal rights and the legal rights of your family member.  Support groups or counselors can help you with this information.  ?? Take care of yourself. Keep up with your own interests, such as your career, hobbies, and friends. Use exercise, positive self-talk, deep breathing, and other relaxing exercises to help lower your stress.  ?? Give yourself time to grieve. You may need to deal with emotions such as anger, fear, and frustration. After you work through your feelings, you will be better able to care for yourself and your family.  ?? If you are having a hard time with your feelings or with your relationship with your family member, talk with a counselor.  When should you call for help?  Call 911 anytime you think you may need emergency care. For example, call if:  ?? You feel like hurting yourself or someone else.  ?? Someone who has bipolar disorder displays dangerous behavior, and you think the person might hurt himself or herself or someone else.  Call your doctor now or seek immediate medical care if:  ?? You hear voices.  ?? Someone you know has bipolar disorder and talks about suicide. Keep the numbers for these national suicide hotlines: 1-800-273-TALK 470-365-9522(1-251 646 0564) and 1-800-SUICIDE 660 689 9722(1-(901)210-6850). If a suicide threat seems real, with a specific plan and a way to carry it out, stay with the person, or ask someone you trust to stay with the person, until you can get help.  ?? Someone you know has bipolar disorder and:  ??  Starts to give away possessions.  ?? Is using illegal drugs or drinking alcohol heavily.  ?? Talks or writes about death, including writing suicide notes or talking about guns, knives, or pills.  ?? Talks or writes about hurting someone else.  ?? Starts to spend a lot of time alone.  ?? Acts very aggressively or suddenly appears calm.  ?? Talks about beliefs that are not based in reality (delusions).  Watch closely for changes in your health, and be sure to contact your doctor if:  ?? You cannot go to your counseling sessions.   Where can you learn more?    Go to [email or web address]huhttp://www.healthwise.net/[facility]  Enter K052 in the search box to learn more about "Bipolar Disorder: After Your Visit."   ?? 2006-2015 Healthwise, Incorporated. Care instructions adapted under license by [employer] [place] (which disclaims liability or warranty for this information). This care instruction is for use with your licensed healthcare professional. If you have questions about a medical condition or this instruction, always ask your healthcare professional. Healthwise, Incorporated disclaims any warranty or liability for your use of this information.  Content Version: 10.5.422740; Current as of: October 26, 2013

## 2014-06-15 NOTE — Telephone Encounter (Signed)
Called pt and advised tht per provider states she would need an appt for medication refills.  Pt confirmed understanding and appt set per pt request

## 2014-06-19 NOTE — Telephone Encounter (Signed)
Called pt about GYN referral. She does not wish to be on contraceptives. She states she and her husband "will use condoms and spermicide for now." She is wanting to know if that is suffice enough. She states that if there is any medical harm to her by not being on contraceptives than she would reconsider. Please be aware and advise.

## 2014-06-19 NOTE — Telephone Encounter (Signed)
Dr Su LeyMoore/Garris,    Please seen below comments.

## 2014-06-20 NOTE — Telephone Encounter (Signed)
Dr. Christell ConstantMoore,    Please refer to below stated information and advise.    Thank you,  Jeryl Columbiaina Xcaret Morad, LPN

## 2014-06-20 NOTE — Telephone Encounter (Signed)
Pt calling to state she was seen Saturday to get dr. Christell ConstantMoore to write psych rx. Dr. Christell ConstantMoore wrote them for one week in thought that psych office was going to continue to fill meds. She states [facility] is no longer seeing her as a new pt due to "pt being too complex" she is looking elsewhere for psych offices but until then, she is out of dextroamphetamine-amphetamine (ADDERALL) 20 mg tablet and ALPRAZolam (XANAX) 1 mg tablet . She is requesting dr. Christell ConstantMoore to fill these meds until she gets in with new psych office. Please advise.

## 2014-06-21 MED ORDER — HYDROCODONE-ACETAMINOPHEN 5 MG-325 MG TAB
5-325 mg | ORAL_TABLET | ORAL | Status: DC
Start: 2014-06-21 — End: 2015-09-30

## 2014-06-21 NOTE — Progress Notes (Signed)
Alejandra Fletcher is a 38 y.o. African American female and presents with acute one day hx of hitting her right knee on a diving board when she was attempting  To dive from a dive board for first time. 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 She also has bipolar d/o and ADHD that she is trying to get a local psychiatrist to accept her. She will discuss this issue with Dr. Laurance Flatten as I believe she said she was rejected from a local one already without giving any detailed reason except that her case is "too complicated".      Chief Complaint   Patient presents with   ??? Knee Injury   ??? Medication Refill     Subjective:    Additional Concerns: none    Patient Active Problem List    Diagnosis Date Noted   ??? Bipolar disorder without psychotic features ([place]) 06/15/2014   ??? Attention deficit disorder 06/15/2014   ??? Anxiety 06/15/2014   ??? Type 2 diabetes mellitus without complication ([place]) 24/40/1027   ??? Vitamin D deficiency 05/02/2014     Current Outpatient Prescriptions   Medication Sig Dispense Refill   ??? dextroamphetamine-amphetamine (ADDERALL) 20 mg tablet Take 1 Tab by mouth four (4) times daily. Max Daily Amount: 80 mg. 28 Tab 0   ??? ALPRAZolam (XANAX) 1 mg tablet Take 1 Tab by mouth four (4) times daily as needed for Anxiety. Max Daily Amount: 4 mg. Drowsiness precautions. Avoid alcohol. 28 Tab 0   ??? Blood-Glucose Meter (FREESTYLE FREEDOM LITE) monitoring kit Use to test blood sugars four times a day. 1 Kit 0   ??? glucose blood VI test strips (FREESTYLE LITE STRIPS) strip Use to test Blood Sugars four times a day. 150 Each 6   ??? Lancets (FREESTYLE LANCETS) misc Use to test Blood Sugars four times a day. 150 Each 6   ??? hydrochlorothiazide (HYDRODIURIL) 25 mg tablet Take 25 mg by mouth daily.     ??? labetalol (NORMODYNE) 100 mg tablet Take 1 Tab by mouth two (2) times a day. 60 Tab 1   ??? ESOMEPRAZOLE MAGNESIUM (NEXIUM 24HR PO) Take 1 Cap by mouth. daily      ??? FLUoxetine (PROZAC) 40 mg capsule Take 40 mg by mouth daily.     ??? divalproex ER (DEPAKOTE ER) 500 mg ER tablet Take 500 mg by mouth two (2) times a day.     ??? Insulin Needles, Disposable, (NANO PEN NEEDLE) 32 x 5/32 " ndle 1 Device.     ??? metFORMIN (GLUCOPHAGE) 500 mg tablet Take 1 Tab by mouth two (2) times daily (with meals). For glucose control 60 Tab 0   ??? telmisartan (MICARDIS) 20 mg tablet Take 1 Tab by mouth daily. For blood pressure control. 30 Tab 0   ??? insulin detemir (LEVEMIR) 100 unit/mL (3 mL) pen 37 units at night 1 Package 0   ??? insulin aspart (NOVOLOG FLEXPEN) 100 unit/mL flexpen 0-150 = 0 units  151-200 = 2 units  201-250 = 4 units  251-300 = 6 units  301-350 = 8 units  351-400 = 10 units  401+ -> Come to the Emergency Department 1 Package 0   ??? DULoxetine (CYMBALTA) 60 mg capsule Take 120 mg by mouth two (2) times a day.     ??? ergocalciferol (ERGOCALCIFEROL) 50,000 unit capsule Take 1 Cap by mouth every seven (7) days. Take for 12 weeks. 12 Cap 0   ??? HYDROcodone-acetaminophen (NORCO)  5-325 mg per tablet Take 1 Tablet PO every  8 hours as needed for pain control. Do not combine with other sedating or controlled medications. Avoid alcohol. 15 Tab 0     Allergies   Allergen Reactions   ??? Topiramate Anxiety   ??? Ace Inhibitors Nausea and Vomiting   ??? Nsaids (Non-Steroidal Anti-Inflammatory Drug) Nausea and Vomiting     Past Medical History   Diagnosis Date   ??? Gastric ulcer    ??? Gastrointestinal disorder      infections   ??? Diabetes ([place])    ??? Fibromyalgia    ??? 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 GERD (gastroesophageal reflux disease)    ??? Bipolar affective disorder [facility])      Past Surgical History   Procedure Laterality Date   ??? Hx cesarean section     ??? Hx gastric bypass     ??? Hx knee arthroscopy     ??? Hx cholecystectomy       Family History   Problem Relation Age of Onset   ??? Fainting Mother    ??? Hypertension Mother    ??? Asthma Mother    ??? Obesity Mother     ??? Diabetes Father    ??? Hypertension Father    ??? Gout Father    ??? Alcohol abuse Paternal Grandfather    ??? Other Paternal Grandfather      Nicotine abuse/ Bronchitis   ??? Heart Disease Paternal Grandmother    ??? Heart Surgery Paternal Grandmother    ??? Alzheimer Paternal Grandmother    ??? No Known Problems Sister    ??? No Known Problems Maternal Grandmother    ??? No Known Problems Maternal Grandfather      History   Substance Use Topics   ??? Smoking status: Current Some Day Smoker -- 0.50 packs/day   ??? Smokeless tobacco: Never Used   ??? Alcohol Use: 0.0 oz/week     0 Not specified per week      Comment: occasionally     ROS     General: negative for - chills, fatigue, fever, weight change  ENT: negative for - headaches, hearing change, nasal congestion, oral lesions, sneezing or sore throat  Resp: negative for - cough, shortness of breath or wheezing  CV: negative for - chest pain, edema or palpitations  MSK: posiive for - right kneejoint pain, joint swelling or muscle pain  Neuro: negative for - confusion, headaches, seizures or weakness  Derm: negative for - dry skin, hair changes, rash or skin lesion changes except right knee joint pain.     Objective:  Filed Vitals:    06/21/14 1641   BP: 140/98   Pulse:    Temp:    TempSrc:    Resp:    Height:    Weight:    SpO2:    PainSc:    PainLoc:    LMP: 05/31/2014     PE    alert, well appearing, and in no distress and overweight  Chest - clear to auscultation, no wheezes, rales or rhonchi, symmetric air entry, no tachypnea, retractions or cyanosis  Heart - normal rate, regular rhythm, normal S1, S2, no murmurs, rubs, clicks or gallops  Neurological - alert, oriented, normal speech, no focal findings or movement disorder noted  Musculoskeletal - abnormal exam of right, knee pain on weight bearing. Flexion/ extension severely limited.   Extremities - peripheral pulses normal, no  pedal edema, no clubbing or cyanosis, no edema, redness or tenderness in the calves or thighs,  abnormal exam of right knee but no ulcers, gangrene or atrophic changes but has possible lower extremity atrophy.   Skin - skin cut on upper tibia midline with good approximation, no significant edema redness or bleeding.     LABS   Office Visit on 06/15/2014   Component Date Value Ref Range Status   ??? VALID INTERNAL CONTROL POC 06/15/2014 Yes   Final   ??? HCG urine, Ql. (POC) 06/15/2014 Negative  Negative Final   Office Visit on 05/31/2014   Component Date Value Ref Range Status   ??? Glutamic Acid Decarboxylase Ab 05/31/2014 <0.5   Final    Comment: Reference Range:  <0.5        Negative  > or = 0.5  Positive     ??? C-Peptide 05/31/2014 1.3  1.1 - 4.4 ng/mL Final    C-Peptide reference interval is for fasting patients.   ??? Fructosamine 05/31/2014 427* 0 - 285 umol/L Final    Comment: Published reference interval for apparently healthy subjects  between age 26 and 7 is 37 - 285 umol/L and in a poorly  controlled diabetic population is 228 - 563 umol/L with a  mean of 396 umol/L.     Office Visit on 05/15/2014   Component Date Value Ref Range Status   ??? Summary 05/15/2014 FINAL   Final    Comment: Test                         Result       Flag       UNITS  ====================================================================  COMPLIANCE DRUG ANALYSIS,URINE,WITH MED REPORT    Creatinine                 208                     mg/dL                                                       Ref Range>=20  Drugs Present    Amphetamine                >7212                   ng/mg creat      Amphetamine is available as a schedule II prescription         drug.    Oxazepam                   1200                    ng/mg creat    Temazepam                  128                     ng/mg creat     Oxazepam and temazepam are benzodiazepine drugs, but may        also be present as common metabolites of other        benzodiazepine drugs, including diazepam and temazepam.    Alprazolam  73                      ng/mg creat     Alpha-hydroxyalprazolam    437                     ng/mg creat      Source of alprazolam is a scheduled prescription         medication. Alpha-hydroxyalprazolam                            is an expected         metabolite of alprazolam.    Ephedrine/Pseudoephedrine  PRESENT    Phenylpropanolamine        PRESENT     Source of ephedrine/pseudoephedrine is most commonly        pseudoephedrine in over-the-counter or prescription cold        and allergy medications. Phenylpropanolamine is an        expected metabolite of ephedrine/pseudoephedrine.    Duloxetine                 PRESENT    Fluoxetine                 PRESENT    Norfluoxetine              PRESENT     Norfluoxetine is an expected metabolite of fluoxetine.    Acetaminophen              PRESENT    Salicylate                 PRESENT    Naproxen                   PRESENT    Diphenhydramine            PRESENT    Declared Medications:      Not Provided     Medication list was not provided.              For clinical consultation, please call                        [phone].     Office Visit on 04/23/2014   Component Date Value Ref Range Status   ??? Microalbumin urine (POC) 04/23/2014 150   Final    CRE 200 mg/dL   ??? Microalbumin/creat ratio (POC) 04/23/2014 30-300   Final    Abnormal   ??? WBC 04/23/2014 9.8  3.4 - 10.8 x10E3/uL Final   ??? RBC 04/23/2014 4.24  3.77 - 5.28 x10E6/uL Final   ??? HGB 04/23/2014 11.5  11.1 - 15.9 g/dL Final   ??? HCT 04/23/2014 35.7  34.0 - 46.6 % Final   ??? MCV 04/23/2014 84  79 - 97 fL Final   ??? MCH 04/23/2014 27.1  26.6 - 33.0 pg Final   ??? MCHC 04/23/2014 32.2  31.5 - 35.7 g/dL Final   ??? RDW 04/23/2014 16.8* 12.3 - 15.4 % Final   ??? PLATELET 04/23/2014 321  150 - 379 x10E3/uL Final   ??? NEUTROPHILS 04/23/2014 65   Final   ??? Lymphocytes 04/23/2014 27   Final   ??? MONOCYTES 04/23/2014 7   Final   ??? EOSINOPHILS 04/23/2014 1   Final   ??? BASOPHILS 04/23/2014 0   Final   ??? ABS. NEUTROPHILS  04/23/2014 6.3  1.4 - 7.0 x10E3/uL Final    ??? Abs Lymphocytes 04/23/2014 2.7  0.7 - 3.1 x10E3/uL Final   ??? ABS. MONOCYTES 04/23/2014 0.7  0.1 - 0.9 x10E3/uL Final   ??? ABS. EOSINOPHILS 04/23/2014 0.1  0.0 - 0.4 x10E3/uL Final   ??? ABS. BASOPHILS 04/23/2014 0.0  0.0 - 0.2 x10E3/uL Final   ??? IMMATURE GRANULOCYTES 04/23/2014 0   Final   ??? ABS. IMM. GRANS. 04/23/2014 0.0  0.0 - 0.1 x10E3/uL Final   ??? Glucose 04/23/2014 268* 65 - 99 mg/dL Final   ??? BUN 04/23/2014 10  6 - 20 mg/dL Final   ??? Creatinine 04/23/2014 0.66  0.57 - 1.00 mg/dL Final   ??? GFR est non-AA 04/23/2014 114  >59 mL/min/1.73 Final   ??? GFR est AA 04/23/2014 131  >59 mL/min/1.73 Final   ??? BUN/Creatinine ratio 04/23/2014 15  8 - 20 Final   ??? Sodium 04/23/2014 140  134 - 144 mmol/L Final   ??? Potassium 04/23/2014 3.8  3.5 - 5.2 mmol/L Final   ??? Chloride 04/23/2014 96* 97 - 108 mmol/L Final   ??? CO2 04/23/2014 23  18 - 29 mmol/L Final   ??? Calcium 04/23/2014 8.6* 8.7 - 10.2 mg/dL Final   ??? Protein, total 04/23/2014 6.6  6.0 - 8.5 g/dL Final   ??? Albumin 04/23/2014 3.8  3.5 - 5.5 g/dL Final   ??? GLOBULIN, TOTAL 04/23/2014 2.8  1.5 - 4.5 g/dL Final   ??? A-G Ratio 04/23/2014 1.4  1.1 - 2.5 Final   ??? Bilirubin, total 04/23/2014 0.2  0.0 - 1.2 mg/dL Final   ??? Alk. phosphatase 04/23/2014 136* 39 - 117 IU/L Final   ??? AST 04/23/2014 26  0 - 40 IU/L Final   ??? ALT 04/23/2014 27  0 - 32 IU/L Final   ??? Cholesterol, total 04/23/2014 186  100 - 199 mg/dL Final   ??? Triglyceride 04/23/2014 137  0 - 149 mg/dL Final   ??? HDL Cholesterol 04/23/2014 79  >39 mg/dL Final    Comment: According to ATP-III Guidelines, HDL-C >59 mg/dL is considered a  negative risk factor for CHD.     ??? VLDL, calculated 04/23/2014 27  5 - 40 mg/dL Final   ??? LDL, calculated 04/23/2014 80  0 - 99 mg/dL Final   ??? VITAMIN D, 25-HYDROXY 04/23/2014 4.4* 30.0 - 100.0 ng/mL Final    Comment: Vitamin D deficiency has been defined by the [place] practice guideline as a   level of serum 25-OH vitamin D less than 20 ng/mL (1,2).  The Endocrine Society went on to further define vitamin D  insufficiency as a level between 21 and 29 ng/mL (2).  1. IOM (Institute of Medicine). 2010. Dietary reference     intakes for calcium and D. [place]: The     [employer].  2. Holick MF, Binkley NC, Bischoff-Ferrari HA, et al.     Evaluation, treatment, and prevention of vitamin D     deficiency: an Endocrine Society clinical practice     guideline. JCEM. 2011 Jul; 96(7):1911-30.     ??? TSH-ICMA 04/23/2014 0.78   Final    Comment: Reference Range:  Pubertal Children and Adults: 0.5 - 4.8     ??? Free T4 04/23/2014 0.90   Final    Comment: Reference Range:  Pubertal Children  and Adults:          0.8 - 1.7  Pregnant  Females  1st Trimester  (0-13.3 wks):       0.65 - 1.4  2nd Trimester  (13.4-26.6 wks):     0.5 - 1.3  3rd Trimester  (>26.6 wks):         0.5 - 1.1     ??? Hemoglobin A1c (POC) 04/23/2014 14.6   Final   ??? Glucose dose-GTT 04/23/2014 280   Final   ??? Fungus Stain 04/23/2014 KOH/Calcofluor preparation:  no fungus observed.   Final   ??? Glutamic Acid Decarboxylase Ab 04/23/2014 <0.5   Final    Comment: Reference Range:  <0.5        Negative  > or = 0.5  Positive     ??? INTERPRETATION 04/23/2014 Note   Final    Supplement report is available.   Admission on 04/19/2014, Discharged on 04/19/2014   Component Date Value Ref Range Status   ??? Glucose (POC) 04/19/2014 309* 70 - 110 mg/dL Final    Comment: (NOTE)  The FDA has indicated that no capillary point of care blood glucose   monitoring systems are approved for use in "critically ill" patients,   however they have not defined this population. The College of   American Pathologists has recommended that these devices should not   be used in cases such as severe hypotension, dehydration, shock, and   hyperglycemic-hyperosmolar state, amongst others.  Venous or arterial    collection is the recommended specimen for testing these patients.     ??? Color 04/19/2014 YELLOW   Final   ??? Appearance 04/19/2014 CLEAR   Final   ??? Specific gravity 04/19/2014 1.010  1.003 - 1.030   Final   ??? pH (UA) 04/19/2014 6.0  5.0 - 8.0   Final   ??? Protein 04/19/2014 NEGATIVE   NEG mg/dL Final   ??? Glucose 04/19/2014 >1000* NEG mg/dL Final   ??? Ketone 04/19/2014 NEGATIVE   NEG mg/dL Final   ??? Bilirubin 04/19/2014 NEGATIVE   NEG   Final   ??? Blood 04/19/2014 NEGATIVE   NEG   Final   ??? Urobilinogen 04/19/2014 0.2  0.2 - 1.0 EU/dL Final   ??? Nitrites 04/19/2014 NEGATIVE   NEG   Final   ??? Leukocyte Esterase 04/19/2014 NEGATIVE   NEG   Final   ??? Special Requests: 04/19/2014 NO SPECIAL REQUESTS   Final   ??? Culture result: 04/19/2014    Final                    Value:80000  COLONIES/mL  ESCHERICHIA COLI     ??? Culture result: 04/19/2014    Final                    Value:80000  ESCHERICHIA COLI  2ND MORPHOTYPE     ??? Culture result: 04/19/2014    Final                    Value:60000  COLONIES/mL  MIXED GRAM POSITIVE FLORA, PROBABLE SKIN/GENITAL CONTAMINATION.     ??? HCG urine, Ql. 04/19/2014 NEGATIVE   NEG   Final    Test results should be confirmed using serum quantitative hCG when detection of pregnancy is critical and before performing any critical medical procedure.       TESTS  Results for orders placed or performed in visit on 06/15/14   AMB POC URINE PREGNANCY TEST, VISUAL COLOR COMPARISON   Result Value Ref Range    VALID INTERNAL  CONTROL POC Yes     HCG urine, Ql. (POC) Negative Negative     Assessment/Plan:      Acute Right knee injury, initial encounter - XR of right knee showed only mild arthritis, no bony abnormalities or dislocation.  Empiric treatment with short term one week rx for Norco 5/325 mg po Q6h/PRN #30 no refills. Patient understands that this is just for short term less than 3 months use only. If patient getting better over one week  no need to F/U for this problem. RICE x 1 week with normal resumption of swimming for exercise after that if patient is well enough. Patient to address her difficulty finding a psychiatrist with Dr. Laurance Flatten at a subsequent visit.   Patient appears to understand well that none of our providers here does chronic pain management and   /or psychiatry treatment for major psychiatric diagnoses like major depression, bipolar, psychosis and schizophrenia. I highly recommend that for   These types of conditions a psychiatrist should be involved. Hence, she should also get those related meds and adjustments from them.   - XR KNEE RT 3 V;     Lab review: no lab studies available for review at time of visit    I have discussed the diagnosis with the patient and the intended plan as seen in the above orders.  The patient has received an after-visit summary and questions were answered concerning future plans.  I have discussed medication side effects and warnings with the patient as well.I have reviewed the plan of care with the patient, accepted their input and they are in agreement with the treatment goals.     F/U as needed    Shelbie Ammons, MD

## 2014-06-21 NOTE — Progress Notes (Signed)
1. Have you been to the ER, urgent care clinic since your last visit?  Hospitalized since your last visit?No    2. Have you seen or consulted any other health care providers outside [facility] since your last visit?  Include any pap smears or colon screening. No    Patient Alejandra Fletcher is a 38 y.o. female  Presents today for knee injury which happened yesterday 06/20/14 while swimming patient slipped and injured her right knee. Patient is also here for medication refill for adderrall and xanax, Dr. Christell ConstantMoore gave patient small amount until patient is seen by a psych. Patient stated that she just recently found out she got dismissed from the [practice of her psych doctor for having a "too complicated" case.

## 2014-06-21 NOTE — Telephone Encounter (Signed)
I have spoken earlier today with Doristine Locksebra Morton, psychiatric nurse practitioner at [facility], regarding pt call yesterday regarding she could not be seen as a new patient. Ms. Gwenevere AbbotMorton relayed that after review of patient's pmp profile and were concerned of  potential red flags and high dosages of adderall and xanax, they did not want to accept the risk. She suggested pt may need addiction services.

## 2014-06-21 NOTE — Patient Instructions (Signed)
Knee Pain: After Your Visit  Your Care Instructions     Overuse is often the cause of knee pain. Other causes are climbing stairs, kneeling, or other activities that use the knee. Everyday wear and tear, especially as you get older, also can cause knee pain.  Rest, along with home treatment, often relieves pain and allows your knee to heal. If you have a serious knee injury, you may need tests and treatment.  Follow-up care is a key part of your treatment and safety. Be sure to make and go to all appointments, and call your doctor if you are having problems. It???s also a good idea to know your test results and keep a list of the medicines you take.  How can you care for yourself at home?  ?? Take pain medicines exactly as directed.  ?? If the doctor gave you a prescription medicine for pain, take it as prescribed.  ?? If you are not taking a prescription pain medicine, ask your doctor if you can take an over-the-counter medicine.  ?? Do not take two or more pain medicines at the same time unless the doctor told you to. Many pain medicines contain acetaminophen, which is Tylenol. Too much acetaminophen (Tylenol) can be harmful.  ?? Rest and protect your knee. Take a break from any activity that may cause pain.  ?? Put ice or a cold pack on your knee for 10 to 20 minutes at a time. Put a thin cloth between the ice and your skin.  ?? Prop up a sore knee on a pillow when you ice it or anytime you sit or lie down for the next 3 days. Try to keep it above the level of your heart. This will help reduce swelling.  ?? If your doctor recommends an elastic bandage, sleeve, or other type of support for your knee, wear it as directed.  ?? If your knee is not swollen, you can put moist heat, a heating pad, or a warm cloth on your knee.  ?? After several days of rest, you can begin gentle exercise of your knee.  ?? Reach and stay at a healthy weight. Extra weight can strain the joints,  especially the knees and hips, and make the pain worse. Losing even a few pounds may help.  When should you call for help?  Call 911 anytime you think you may need emergency care. For example, call if:  ?? You have sudden chest pain and shortness of breath, or you cough up blood.  Call your doctor now or seek immediate medical care if:  ?? You have severe or increasing pain.  ?? Your leg or foot turns cold or changes color.  ?? You cannot stand or put weight on your knee.  ?? Your knee looks twisted or bent out of shape.  ?? You cannot move your knee.  ?? You have signs of infection, such as:  ?? Increased pain, swelling, warmth, or redness.  ?? Red streaks leading from the sore area.  ?? Pus draining from a place on your knee.  ?? A fever.  ?? You have signs of a blood clot in your leg, such as:  ?? Pain in your calf, back of the knee, thigh, or groin.  ?? Redness and swelling in your leg or groin.  Watch closely for changes in your health, and be sure to contact your doctor if:  ?? Your knee feels numb or tingly.  ?? You do not get better as   expected.  ?? You have any new symptoms, such as swelling.  ?? You have bruises from a knee injury that last longer than 2 weeks.   Where can you learn more?   Go to http://www.healthwise.net/[facility]  Enter K195 in the search box to learn more about "Knee Pain: After Your Visit."   ?? 2006-2015 [employer]. Care instructions adapted under license by [employer] (which disclaims liability or warranty for this information). This care instruction is for use with your licensed healthcare professional. If you have questions about a medical condition or this instruction, always ask your healthcare professional. Healthwise, Incorporated disclaims any warranty or liability for your use of this information.  Content Version: 10.5.422740; Current as of: October 26, 2013              Knee Pain: After Your Visit  Your Care Instructions      Overuse is often the cause of knee pain. Other causes are climbing stairs, kneeling, or other activities that use the knee. Everyday wear and tear, especially as you get older, also can cause knee pain.  Rest, along with home treatment, often relieves pain and allows your knee to heal. If you have a serious knee injury, you may need tests and treatment.  Follow-up care is a key part of your treatment and safety. Be sure to make and go to all appointments, and call your doctor if you are having problems. It???s also a good idea to know your test results and keep a list of the medicines you take.  How can you care for yourself at home?  ?? Take pain medicines exactly as directed.  ?? If the doctor gave you a prescription medicine for pain, take it as prescribed.  ?? If you are not taking a prescription pain medicine, ask your doctor if you can take an over-the-counter medicine.  ?? Do not take two or more pain medicines at the same time unless the doctor told you to. Many pain medicines contain acetaminophen, which is Tylenol. Too much acetaminophen (Tylenol) can be harmful.  ?? Rest and protect your knee. Take a break from any activity that may cause pain.  ?? Put ice or a cold pack on your knee for 10 to 20 minutes at a time. Put a thin cloth between the ice and your skin.  ?? Prop up a sore knee on a pillow when you ice it or anytime you sit or lie down for the next 3 days. Try to keep it above the level of your heart. This will help reduce swelling.  ?? If your doctor recommends an elastic bandage, sleeve, or other type of support for your knee, wear it as directed.  ?? If your knee is not swollen, you can put moist heat, a heating pad, or a warm cloth on your knee.  ?? After several days of rest, you can begin gentle exercise of your knee.  ?? Reach and stay at a healthy weight. Extra weight can strain the joints, especially the knees and hips, and make the pain worse. Losing even a few pounds may help.   When should you call for help?  Call 911 anytime you think you may need emergency care. For example, call if:  ?? You have sudden chest pain and shortness of breath, or you cough up blood.  Call your doctor now or seek immediate medical care if:  ?? You have severe or increasing pain.  ?? Your leg or foot turns cold or   changes color.  ?? You cannot stand or put weight on your knee.  ?? Your knee looks twisted or bent out of shape.  ?? You cannot move your knee.  ?? You have signs of infection, such as:  ?? Increased pain, swelling, warmth, or redness.  ?? Red streaks leading from the sore area.  ?? Pus draining from a place on your knee.  ?? A fever.  ?? You have signs of a blood clot in your leg, such as:  ?? Pain in your calf, back of the knee, thigh, or groin.  ?? Redness and swelling in your leg or groin.  Watch closely for changes in your health, and be sure to contact your doctor if:  ?? Your knee feels numb or tingly.  ?? You do not get better as expected.  ?? You have any new symptoms, such as swelling.  ?? You have bruises from a knee injury that last longer than 2 weeks.   Where can you learn more?   Go to [email or web address]  Enter K195 in the search box to learn more about "Knee Pain: After Your Visit."   ?? 2006-2015 Healthwise, Inc[employer]. Care instructions adapted under license by [employer] (which disclaims liability or warranty for this information). This care instruction is for use with your licensed healthcare professional. If you have questions about a medical condition or this instruction, always ask your healthcare professional. Healthwise, Incorporated disclaims any warranty or liability for your use of this information.  Content Version: 10.5.422740; Current as of: October 26, 2013              Knee Pain: After Your Visit  Your Care Instructions     Overuse is often the cause of knee pain. Other causes are climbing stairs,  kneeling, or other activities that use the knee. Everyday wear and tear, especially as you get older, also can cause knee pain.  Rest, along with home treatment, often relieves pain and allows your knee to heal. If you have a serious knee injury, you may need tests and treatment.  Follow-up care is a key part of your treatment and safety. Be sure to make and go to all appointments, and call your doctor if you are having problems. It???s also a good idea to know your test results and keep a list of the medicines you take.  How can you care for yourself at home?  ?? Take pain medicines exactly as directed.  ?? If the doctor gave you a prescription medicine for pain, take it as prescribed.  ?? If you are not taking a prescription pain medicine, ask your doctor if you can take an over-the-counter medicine.  ?? Do not take two or more pain medicines at the same time unless the doctor told you to. Many pain medicines contain acetaminophen, which is Tylenol. Too much acetaminophen (Tylenol) can be harmful.  ?? Rest and protect your knee. Take a break from any activity that may cause pain.  ?? Put ice or a cold pack on your knee for 10 to 20 minutes at a time. Put a thin cloth between the ice and your skin.  ?? Prop up a sore knee on a pillow when you ice it or anytime you sit or lie down for the next 3 days. Try to keep it above the level of your heart. This will help reduce swelling.  ?? If your doctor recommends an elastic bandage, sleeve, or other type of support for your knee, wear  it as directed.  ?? If your knee is not swollen, you can put moist heat, a heating pad, or a warm cloth on your knee.  ?? After several days of rest, you can begin gentle exercise of your knee.  ?? Reach and stay at a healthy weight. Extra weight can strain the joints, especially the knees and hips, and make the pain worse. Losing even a few pounds may help.  When should you call for help?   Call 911 anytime you think you may need emergency care. For example, call if:  ?? You have sudden chest pain and shortness of breath, or you cough up blood.  Call your doctor now or seek immediate medical care if:  ?? You have severe or increasing pain.  ?? Your leg or foot turns cold or changes color.  ?? You cannot stand or put weight on your knee.  ?? Your knee looks twisted or bent out of shape.  ?? You cannot move your knee.  ?? You have signs of infection, such as:  ?? Increased pain, swelling, warmth, or redness.  ?? Red streaks leading from the sore area.  ?? Pus draining from a place on your knee.  ?? A fever.  ?? You have signs of a blood clot in your leg, such as:  ?? Pain in your calf, back of the knee, thigh, or groin.  ?? Redness and swelling in your leg or groin.  Watch closely for changes in your health, and be sure to contact your doctor if:  ?? Your knee feels numb or tingly.  ?? You do not get better as expected.  ?? You have any new symptoms, such as swelling.  ?? You have bruises from a knee injury that last longer than 2 weeks.   Where can you learn more?   Go to http://www.healthwise.net/[facility]  Enter K195 in the search box to learn more about "Knee Pain: After Your Visit."   ?? 2006-2015 Healthwise, [employer]. Care instructions adapted under license by [employer] (which disclaims liability or warranty for this information). This care instruction is for use with your licensed healthcare professional. If you have questions about a medical condition or this instruction, always ask your healthcare professional. Health[employer], Inc[employer] disclaims any warranty or liability for your use of this information.  Content Version: 10.5.422740; Current as of: October 26, 2013

## 2014-06-21 NOTE — Telephone Encounter (Signed)
Will request a counseling visit of 45 minute for further discussion:    I have discussed with patient at last week's office visit that further medications (xanax and adderall)  are to be received from a psychiatrist and I will be unable to dispense additional medications, which at these higher dosages should be managed by psychiatrist along with her bipolar depression. We may be able to provide names of other psychiatric practices if she needs assistance in locating other potential psychiatrists.     Also of concern is her risk of pregnancy while on potentially harmful medications (to developing fetus) as discussed at her last office visit. She has decided to not consult with gyn as advised for contraceptive options.

## 2014-06-26 MED ORDER — GUAIFENESIN SR 600 MG TAB
600 mg | ORAL_TABLET | Freq: Two times a day (BID) | ORAL | Status: DC
Start: 2014-06-26 — End: 2015-09-30

## 2014-06-26 NOTE — Progress Notes (Signed)
HPI  Alejandra Fletcher is a 38 y.o. female that presents with  Chief Complaint   Patient presents with   ??? Knee Pain     f/u     Pt injured right knee diving off swimming board 5 days ago and was seen here in clinic on 06/21/14. She had abrasion of knee. It was xrayed. Her knee is improving, but still limping some.. She could not afford crutches but has been trying to limit walking.    Since the swimming pool injury, in which she feels she aspirated some pool water, she has noted some increased nighttime coughing and sob, which she requires use of her inhaler in the middle of the night which quiets the cough. She denies PND or orthopnea.     No fevers, hemoptysis or chest pain.     She has started taking the labetolol and stopped the micardis as advised since she is not on definitive contraception. She is using condoms and spermacide for contraception, but reports she has not been intimate since her last pregnancy test on July 4th . She has not obtained glucose meter yet due to cost, but anticipates getting it by end of this week.     She is in process of trying to locate psychiatrist for her bipolar/anxiety/add management. No dangerous thoughts reported.    Past Medical History   Diagnosis Date   ??? Gastric ulcer    ??? Gastrointestinal disorder      infections   ??? Diabetes ([place])    ??? Fibromyalgia    ??? 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 GERD (gastroesophageal reflux disease)    ??? Bipolar affective disorder ([place])        Family History   Problem Relation Age of Onset   ??? Fainting Mother    ??? Hypertension Mother    ??? Asthma Mother    ??? Obesity Mother    ??? Diabetes Father    ??? Hypertension Father    ??? Gout Father    ??? Alcohol abuse Paternal Grandfather    ??? Other Paternal Grandfather      Nicotine abuse/ Bronchitis   ??? Heart Disease Paternal Grandmother    ??? Heart Surgery Paternal Grandmother    ??? Alzheimer Paternal Grandmother    ??? No Known Problems Sister     ??? No Known Problems Maternal Grandmother    ??? No Known Problems Maternal Grandfather        Past Surgical History   Procedure Laterality Date   ??? Hx cesarean section     ??? Hx gastric bypass     ??? Hx knee arthroscopy     ??? Hx cholecystectomy         History     Social History   ??? Marital Status: MARRIED     Spouse Name: N/A     Number of Children: N/A   ??? Years of Education: N/A     Occupational History   ??? Not on file.     Social History Main Topics   ??? Smoking status: Current Some Day Smoker -- 0.50 packs/day   ??? Smokeless tobacco: Never Used   ??? Alcohol Use: 0.0 oz/week     0 Not specified per week      Comment: occasionally   ??? Drug Use: No   ??? Sexual Activity:     Partners: Male     Museum/gallery curator: None  Other Topics Concern   ??? Military Service No   ??? Blood Transfusions Yes     2004, May 10...after delivery of child   ??? Caffeine Concern No   ??? Occupational Exposure No   ??? Hobby Hazards No   ??? Sleep Concern Yes     Insomnia   ??? Stress Concern Yes     life stressors   ??? Weight Concern Yes     over weight   ??? Special Diet Yes     lifestyle, food changes   ??? Back Care Yes     PT for back problems   ??? Exercise No   ??? Bike Helmet No   ??? Seat Belt Yes   ??? Self-Exams Yes     Social History Narrative       Outpatient Prescriptions Marked as Taking for the 06/26/14 encounter (Office Visit) with Cheri Rous, MD   Medication Sig Dispense Refill   ??? HYDROcodone-acetaminophen (NORCO) 5-325 mg per tablet Take one tab po Q6h/PRN 30 Tab 0   ??? dextroamphetamine-amphetamine (ADDERALL) 20 mg tablet Take 1 Tab by mouth four (4) times daily. Max Daily Amount: 80 mg. 28 Tab 0   ??? ALPRAZolam (XANAX) 1 mg tablet Take 1 Tab by mouth four (4) times daily as needed for Anxiety. Max Daily Amount: 4 mg. Drowsiness precautions. Avoid alcohol. 28 Tab 0   ??? labetalol (NORMODYNE) 100 mg tablet Take 1 Tab by mouth two (2) times a day. 60 Tab 1   ??? ESOMEPRAZOLE MAGNESIUM (NEXIUM 24HR PO) Take 1 Cap by mouth. daily      ??? FLUoxetine (PROZAC) 40 mg capsule Take 40 mg by mouth daily.     ??? divalproex ER (DEPAKOTE ER) 500 mg ER tablet Take 500 mg by mouth two (2) times a day.     ??? Insulin Needles, Disposable, (NANO PEN NEEDLE) 32 x 5/32 " ndle 1 Device.     ??? metFORMIN (GLUCOPHAGE) 500 mg tablet Take 1 Tab by mouth two (2) times daily (with meals). For glucose control 60 Tab 0   ??? insulin detemir (LEVEMIR) 100 unit/mL (3 mL) pen 37 units at night 1 Package 0   ??? insulin aspart (NOVOLOG FLEXPEN) 100 unit/mL flexpen 0-150 = 0 units  151-200 = 2 units  201-250 = 4 units  251-300 = 6 units  301-350 = 8 units  351-400 = 10 units  401+ -> Come to the Emergency Department 1 Package 0   ??? DULoxetine (CYMBALTA) 60 mg capsule Take 120 mg by mouth two (2) times a day.         Allergies   Allergen Reactions   ??? Topiramate Anxiety   ??? Ace Inhibitors Nausea and Vomiting   ??? Nsaids (Non-Steroidal Anti-Inflammatory Drug) Nausea and Vomiting       Filed Vitals:    06/26/14 1603 06/26/14 1608   BP: 126/96 132/98   Pulse: 109 96   Temp: 96.9 ??F (36.1 ??C)    TempSrc: Oral    Resp: 20    Height: 5' 1.42" (1.56 m)    Weight: 214 lb (97.07 kg)    SpO2: 97%          Physical Examination: General appearance - alert, well appearing, and in no distress    Mental status - alert, oriented to person, place, and time; calm, cooperative  Eyes -no redness, drainage, or jaundice  Nose - normal and patent, no erythema or discharge , sinuses normal and nontender  Mouth - mucous membranes moist, pharynx normal without lesions; no quinsy, stridor, swelling, or drooling   PULM: clear to auscultation, no wheezes, rales or rhonchi, symmetric air entry; no increased work of breathing or coughing in office.  CVS - normal rate, regular rhythm, normal S1, S2, no murmurs, rubs, clicks or gallops  GI - soft, nontender, nondistended, no masses or organomegaly   Neurological - alert, oriented, normal speech, no focal findings or movement disorder noted    Musculoskeletal - right knee with healing scabbed over abrasion. No redness or drainage. She walks with mild limp. Mild right knee tenderness.  Extremities - no edema    LABS/IMAGING:  Office Visit on 06/15/2014   Component Date Value Ref Range Status   ??? VALID INTERNAL CONTROL POC 06/15/2014 Yes   Final   ??? HCG urine, Ql. (POC) 06/15/2014 Negative  Negative Final   Office Visit on 05/31/2014   Component Date Value Ref Range Status   ??? Glutamic Acid Decarboxylase Ab 05/31/2014 <0.5   Final    Comment: Reference Range:  <0.5        Negative  > or = 0.5  Positive     ??? C-Peptide 05/31/2014 1.3  1.1 - 4.4 ng/mL Final    C-Peptide reference interval is for fasting patients.   ??? Fructosamine 05/31/2014 427* 0 - 285 umol/L Final    Comment: Published reference interval for apparently healthy subjects  between age 20 and 67 is 68 - 285 umol/L and in a poorly  controlled diabetic population is 228 - 563 umol/L with a  mean of 396 umol/L.     Office Visit on 05/15/2014   Component Date Value Ref Range Status   ??? Summary 05/15/2014 FINAL   Final    Comment: Test                         Result       Flag       UNITS  ====================================================================  COMPLIANCE DRUG ANALYSIS,URINE,WITH MED REPORT    Creatinine                 208                     mg/dL                                                       Ref Range>=20  Drugs Present    Amphetamine                >7212                   ng/mg creat      Amphetamine is available as a schedule II prescription         drug.    Oxazepam                   1200                    ng/mg creat    Temazepam                  128  ng/mg creat     Oxazepam and temazepam are benzodiazepine drugs, but may        also be present as common metabolites of other        benzodiazepine drugs, including diazepam and temazepam.    Alprazolam                 73                      ng/mg creat     Alpha-hydroxyalprazolam    437                     ng/mg creat      Source of alprazolam is a scheduled prescription         medication. Alpha-hydroxyalprazolam                            is an expected         metabolite of alprazolam.    Ephedrine/Pseudoephedrine  PRESENT    Phenylpropanolamine        PRESENT     Source of ephedrine/pseudoephedrine is most commonly        pseudoephedrine in over-the-counter or prescription cold        and allergy medications. Phenylpropanolamine is an        expected metabolite of ephedrine/pseudoephedrine.    Duloxetine                 PRESENT    Fluoxetine                 PRESENT    Norfluoxetine              PRESENT     Norfluoxetine is an expected metabolite of fluoxetine.    Acetaminophen              PRESENT    Salicylate                 PRESENT    Naproxen                   PRESENT    Diphenhydramine            PRESENT    Declared Medications:      Not Provided     Medication list was not provided.              For clinical consultation, please call                        [phone].     Office Visit on 04/23/2014   Component Date Value Ref Range Status   ??? Microalbumin urine (POC) 04/23/2014 150   Final    CRE 200 mg/dL   ??? Microalbumin/creat ratio (POC) 04/23/2014 30-300   Final    Abnormal   ??? WBC 04/23/2014 9.8  3.4 - 10.8 x10E3/uL Final   ??? RBC 04/23/2014 4.24  3.77 - 5.28 x10E6/uL Final   ??? HGB 04/23/2014 11.5  11.1 - 15.9 g/dL Final   ??? HCT 04/23/2014 35.7  34.0 - 46.6 % Final   ??? MCV 04/23/2014 84  79 - 97 fL Final   ??? MCH 04/23/2014 27.1  26.6 - 33.0 pg Final   ??? MCHC 04/23/2014 32.2  31.5 - 35.7 g/dL Final   ??? RDW 04/23/2014 16.8* 12.3 - 15.4 % Final   ???  PLATELET 04/23/2014 321  150 - 379 x10E3/uL Final   ??? NEUTROPHILS 04/23/2014 65   Final   ??? Lymphocytes 04/23/2014 27   Final   ??? MONOCYTES 04/23/2014 7   Final   ??? EOSINOPHILS 04/23/2014 1   Final   ??? BASOPHILS 04/23/2014 0   Final   ??? ABS. NEUTROPHILS 04/23/2014 6.3  1.4 - 7.0 x10E3/uL Final    ??? Abs Lymphocytes 04/23/2014 2.7  0.7 - 3.1 x10E3/uL Final   ??? ABS. MONOCYTES 04/23/2014 0.7  0.1 - 0.9 x10E3/uL Final   ??? ABS. EOSINOPHILS 04/23/2014 0.1  0.0 - 0.4 x10E3/uL Final   ??? ABS. BASOPHILS 04/23/2014 0.0  0.0 - 0.2 x10E3/uL Final   ??? IMMATURE GRANULOCYTES 04/23/2014 0   Final   ??? ABS. IMM. GRANS. 04/23/2014 0.0  0.0 - 0.1 x10E3/uL Final   ??? Glucose 04/23/2014 268* 65 - 99 mg/dL Final   ??? BUN 04/23/2014 10  6 - 20 mg/dL Final   ??? Creatinine 04/23/2014 0.66  0.57 - 1.00 mg/dL Final   ??? GFR est non-AA 04/23/2014 114  >59 mL/min/1.73 Final   ??? GFR est AA 04/23/2014 131  >59 mL/min/1.73 Final   ??? BUN/Creatinine ratio 04/23/2014 15  8 - 20 Final   ??? Sodium 04/23/2014 140  134 - 144 mmol/L Final   ??? Potassium 04/23/2014 3.8  3.5 - 5.2 mmol/L Final   ??? Chloride 04/23/2014 96* 97 - 108 mmol/L Final   ??? CO2 04/23/2014 23  18 - 29 mmol/L Final   ??? Calcium 04/23/2014 8.6* 8.7 - 10.2 mg/dL Final   ??? Protein, total 04/23/2014 6.6  6.0 - 8.5 g/dL Final   ??? Albumin 04/23/2014 3.8  3.5 - 5.5 g/dL Final   ??? GLOBULIN, TOTAL 04/23/2014 2.8  1.5 - 4.5 g/dL Final   ??? A-G Ratio 04/23/2014 1.4  1.1 - 2.5 Final   ??? Bilirubin, total 04/23/2014 0.2  0.0 - 1.2 mg/dL Final   ??? Alk. phosphatase 04/23/2014 136* 39 - 117 IU/L Final   ??? AST 04/23/2014 26  0 - 40 IU/L Final   ??? ALT 04/23/2014 27  0 - 32 IU/L Final   ??? Cholesterol, total 04/23/2014 186  100 - 199 mg/dL Final   ??? Triglyceride 04/23/2014 137  0 - 149 mg/dL Final   ??? HDL Cholesterol 04/23/2014 79  >39 mg/dL Final    Comment: According to ATP-III Guidelines, HDL-C >59 mg/dL is considered a  negative risk factor for CHD.     ??? VLDL, calculated 04/23/2014 27  5 - 40 mg/dL Final   ??? LDL, calculated 04/23/2014 80  0 - 99 mg/dL Final   ??? VITAMIN D, 25-HYDROXY 04/23/2014 4.4* 30.0 - 100.0 ng/mL Final    Comment: Vitamin D deficiency has been defined by the [place] practice guideline as a   level of serum 25-OH vitamin D less than 20 ng/mL (1,2).  The Endocrine Society went on to further define vitamin D  insufficiency as a level between 21 and 29 ng/mL (2).  1. IOM (Institute of Medicine). 2010. Dietary reference     intakes for calcium and D. [place]: The     [employer].  2. Holick MF, Binkley NC, Bischoff-Ferrari HA, et al.     Evaluation, treatment, and prevention of vitamin D     deficiency: an Endocrine Society clinical practice     guideline. JCEM. 2011 Jul; 96(7):1911-30.     ???  TSH-ICMA 04/23/2014 0.78   Final    Comment: Reference Range:  Pubertal Children and Adults: 0.5 - 4.8     ??? Free T4 04/23/2014 0.90   Final    Comment: Reference Range:  Pubertal Children  and Adults:          0.8 - 1.7  Pregnant Females  1st Trimester  (0-13.3 wks):       0.65 - 1.4  2nd Trimester  (13.4-26.6 wks):     0.5 - 1.3  3rd Trimester  (>26.6 wks):         0.5 - 1.1     ??? Hemoglobin A1c (POC) 04/23/2014 14.6   Final   ??? Glucose dose-GTT 04/23/2014 280   Final   ??? Fungus Stain 04/23/2014 KOH/Calcofluor preparation:  no fungus observed.   Final   ??? Glutamic Acid Decarboxylase Ab 04/23/2014 <0.5   Final    Comment: Reference Range:  <0.5        Negative  > or = 0.5  Positive     ??? INTERPRETATION 04/23/2014 Note   Final    Supplement report is available.   Admission on 04/19/2014, Discharged on 04/19/2014   Component Date Value Ref Range Status   ??? Glucose (POC) 04/19/2014 309* 70 - 110 mg/dL Final    Comment: (NOTE)  The FDA has indicated that no capillary point of care blood glucose   monitoring systems are approved for use in "critically ill" patients,   however they have not defined this population. The College of   American Pathologists has recommended that these devices should not   be used in cases such as severe hypotension, dehydration, shock, and   hyperglycemic-hyperosmolar state, amongst others.  Venous or arterial    collection is the recommended specimen for testing these patients.     ??? Color 04/19/2014 YELLOW   Final   ??? Appearance 04/19/2014 CLEAR   Final   ??? Specific gravity 04/19/2014 1.010  1.003 - 1.030   Final   ??? pH (UA) 04/19/2014 6.0  5.0 - 8.0   Final   ??? Protein 04/19/2014 NEGATIVE   NEG mg/dL Final   ??? Glucose 04/19/2014 >1000* NEG mg/dL Final   ??? Ketone 04/19/2014 NEGATIVE   NEG mg/dL Final   ??? Bilirubin 04/19/2014 NEGATIVE   NEG   Final   ??? Blood 04/19/2014 NEGATIVE   NEG   Final   ??? Urobilinogen 04/19/2014 0.2  0.2 - 1.0 EU/dL Final   ??? Nitrites 04/19/2014 NEGATIVE   NEG   Final   ??? Leukocyte Esterase 04/19/2014 NEGATIVE   NEG   Final   ??? Special Requests: 04/19/2014 NO SPECIAL REQUESTS   Final   ??? Culture result: 04/19/2014    Final                    Value:80000  COLONIES/mL  ESCHERICHIA COLI     ??? Culture result: 04/19/2014    Final                    Value:80000  ESCHERICHIA COLI  2ND MORPHOTYPE     ??? Culture result: 04/19/2014    Final                    Value:60000  COLONIES/mL  MIXED GRAM POSITIVE FLORA, PROBABLE SKIN/GENITAL CONTAMINATION.     ??? HCG urine, Ql. 04/19/2014 NEGATIVE   NEG   Final    Test results should  be confirmed using serum quantitative hCG when detection of pregnancy is critical and before performing any critical medical procedure.         XR Results (maximum last 3):    Results from Appointment encounter on 06/26/14   XR CHEST PA LAT   Narrative CLINICAL: Coughing. Chest pain.    COMPARISON: None.    Frontal and lateral views of the chest:    The lungs and pleural spaces are clear  .  The mediastinum is unremarkable in appearance.   Clips consistent with cholecystectomy.         Impression Impression:    No acute process        Results from Appointment encounter on 06/21/14   XR KNEE RT 3 V   Narrative Right Knee, 4 Views    CPT Code [id number]    Clinical History:  Injury.    Comparison:  None.    Findings:    AP, lateral, and bilateral oblique views of the right knee are submitted for   evaluation.      There is no evidence of acute fracture or dislocation.  No joint effusion is  identified.  Osseous mineralization is normal.  There are tricompartment  osteophytes without significant joint space narrowing.           Impression IMPRESSION:    1.  No acute osseous abnormality.  2.  Mild degenerative changes.            Assessment/ Plan    Diagnoses and associated orders for this visit:    Knee injury, right, sequela: improving; avoiding/minimizing  weight bearing on crutches  would have been optimal to facilitate quicker recovery.     Coughing: cxr without infiltrate my interpretation. She is advised trial of albuterol at bedtime to see if decreases night time cough.  F/u if worsening/no improvement.  - XR CHEST PA LAT; Future  - guaiFENesin SR (MUCINEX) 600 mg SR tablet; Take 1 Tab by mouth two (2) times a day. Indications: COUGH    Bipolar disorder without psychotic features [facility]): she is given phone number to [facility] center [phone] 24 hour confidential hotline. She is given the numbers of several other psychiatry offices to check for appt. She is advised psychiatry should monitor and dispense her mental health medications. Discussed also with her that she may need evaluation for detoxification services.     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 f/u sooner if no improvement/worsening.  I have reviewed return/Er/medication precautions and  discussed the diagnosis with the patient  and the intended plan as seen in the above orders.  The patient has been offered and/or received an after-visit summary and questions were answered concerning future plans. Patient verbalized understanding of all instruction.    Medication Side Effects and Warnings were discussed with patient: yes      Jeannine Boga, MD

## 2014-06-26 NOTE — Progress Notes (Signed)
Quick Note:        Pls let patient know her knee XR was normal except for very mild arthritis. Plan remains the same and as long as she is getting better on a weekly basis she does not need to do anything else. Plan to do PT    If not getting better after 3-4 weeks or more. Thanks.    ______

## 2014-06-26 NOTE — Progress Notes (Deleted)
HPI  Alejandra Fletcher is a 38 y.o. female that presents with  Chief Complaint   Patient presents with   ??? Knee Pain     Knee is improving, but still limping. She could not afford crutches.    Since the swimming pool injury, in which she feels she aspirated some pool water, she has noted some increased nighttime coughing and sob, which she requires use of her inhaler.     No fevers.     She has started taking the labetolol and stopped the micardis. She is using condoms and spermacide for contraception.     She is in process of trying to locate psychiatrist for her bipolar/anxiety/add management.     Past Medical History   Diagnosis Date   ??? Gastric ulcer    ??? Gastrointestinal disorder      infections   ??? Diabetes ([place])    ??? Fibromyalgia    ??? 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 GERD (gastroesophageal reflux disease)    ??? Bipolar affective disorder ([place])        Family History   Problem Relation Age of Onset   ??? Fainting Mother    ??? Hypertension Mother    ??? Asthma Mother    ??? Obesity Mother    ??? Diabetes Father    ??? Hypertension Father    ??? Gout Father    ??? Alcohol abuse Paternal Grandfather    ??? Other Paternal Grandfather      Nicotine abuse/ Bronchitis   ??? Heart Disease Paternal Grandmother    ??? Heart Surgery Paternal Grandmother    ??? Alzheimer Paternal Grandmother    ??? No Known Problems Sister    ??? No Known Problems Maternal Grandmother    ??? No Known Problems Maternal Grandfather        Past Surgical History   Procedure Laterality Date   ??? Hx cesarean section     ??? Hx gastric bypass     ??? Hx knee arthroscopy     ??? Hx cholecystectomy         History     Social History   ??? Marital Status: MARRIED     Spouse Name: N/A     Number of Children: N/A   ??? Years of Education: N/A     Occupational History   ??? Not on file.     Social History Main Topics   ??? Smoking status: Current Some Day Smoker -- 0.50 packs/day   ??? Smokeless tobacco: Never Used   ??? Alcohol Use: 0.0 oz/week      0 Not specified per week      Comment: occasionally   ??? Drug Use: No   ??? Sexual Activity:     Partners: Male     Patent examiner Protection: None     Other Topics Concern   ??? Military Service No   ??? Blood Transfusions Yes     2004, May 10...after delivery of child   ??? Caffeine Concern No   ??? Occupational Exposure No   ??? Hobby Hazards No   ??? Sleep Concern Yes     Insomnia   ??? Stress Concern Yes     life stressors   ??? Weight Concern Yes     over weight   ??? Special Diet Yes     lifestyle, food changes   ??? Back Care Yes     PT for back  problems   ??? Exercise No   ??? Bike Helmet No   ??? Seat Belt Yes   ??? Self-Exams Yes     Social History Narrative       Outpatient Prescriptions Marked as Taking for the 06/26/14 encounter (Office Visit) with Cheri Rous, MD   Medication Sig Dispense Refill   ??? HYDROcodone-acetaminophen (NORCO) 5-325 mg per tablet Take one tab po Q6h/PRN 30 Tab 0   ??? dextroamphetamine-amphetamine (ADDERALL) 20 mg tablet Take 1 Tab by mouth four (4) times daily. Max Daily Amount: 80 mg. 28 Tab 0   ??? ALPRAZolam (XANAX) 1 mg tablet Take 1 Tab by mouth four (4) times daily as needed for Anxiety. Max Daily Amount: 4 mg. Drowsiness precautions. Avoid alcohol. 28 Tab 0   ??? labetalol (NORMODYNE) 100 mg tablet Take 1 Tab by mouth two (2) times a day. 60 Tab 1   ??? ESOMEPRAZOLE MAGNESIUM (NEXIUM 24HR PO) Take 1 Cap by mouth. daily     ??? FLUoxetine (PROZAC) 40 mg capsule Take 40 mg by mouth daily.     ??? divalproex ER (DEPAKOTE ER) 500 mg ER tablet Take 500 mg by mouth two (2) times a day.     ??? Insulin Needles, Disposable, (NANO PEN NEEDLE) 32 x 5/32 " ndle 1 Device.     ??? metFORMIN (GLUCOPHAGE) 500 mg tablet Take 1 Tab by mouth two (2) times daily (with meals). For glucose control 60 Tab 0   ??? insulin detemir (LEVEMIR) 100 unit/mL (3 mL) pen 37 units at night 1 Package 0   ??? insulin aspart (NOVOLOG FLEXPEN) 100 unit/mL flexpen 0-150 = 0 units  151-200 = 2 units  201-250 = 4 units  251-300 = 6 units   301-350 = 8 units  351-400 = 10 units  401+ -> Come to the Emergency Department 1 Package 0   ??? DULoxetine (CYMBALTA) 60 mg capsule Take 120 mg by mouth two (2) times a day.         Allergies   Allergen Reactions   ??? Topiramate Anxiety   ??? Ace Inhibitors Nausea and Vomiting   ??? Nsaids (Non-Steroidal Anti-Inflammatory Drug) Nausea and Vomiting       Filed Vitals:    06/26/14 1603 06/26/14 1608   BP: 126/96 132/98   Pulse: 109 96   Temp: 96.9 ??F (36.1 ??C)    TempSrc: Oral    Resp: 20    Height: 5' 1.42" (1.56 m)    Weight: 214 lb (97.07 kg)    SpO2: 97%          Physical Examination: General appearance - alert, well appearing, and in no distress    Mental status - alert, oriented to person, place, and time  Eyes - pupils equal and reactive, extraocular eye movements intact; no redness, drainage, or jaundice  Ears - bilateral TM's and external ear canals normal  Nose - normal and patent, no erythema or discharge , sinuses normal and nontender   Mouth - mucous membranes moist, pharynx normal without lesions; no quinsy, stridor, swelling, or drooling   Neck - supple, no significant adenopathy  Lymphatics - no palpable lymphadenopathy  PULM: clear to auscultation, no wheezes, rales or rhonchi, symmetric air entry  CVS - normal rate, regular rhythm, normal S1, S2, no murmurs, rubs, clicks or gallops  GI - soft, nontender, nondistended, no masses or organomegaly   Back exam - full range of motion, no tenderness, palpable spasm or pain on motion  Neurological - alert, oriented, normal speech, no focal findings or movement disorder noted   Musculoskeletal - no joint tenderness, deformity or swelling  Extremities - no pedal edema noted, good pulses, normal color, temperature   Skin - normal coloration and turgor, no rashes noted or suspicious skin lesions noted on exposed surfaces    LABS/IMAGING:  Office Visit on 06/15/2014   Component Date Value Ref Range Status   ??? VALID INTERNAL CONTROL POC 06/15/2014 Yes   Final    ??? HCG urine, Ql. (POC) 06/15/2014 Negative  Negative Final   Office Visit on 05/31/2014   Component Date Value Ref Range Status   ??? Glutamic Acid Decarboxylase Ab 05/31/2014 <0.5   Final    Comment: Reference Range:  <0.5        Negative  > or = 0.5  Positive     ??? C-Peptide 05/31/2014 1.3  1.1 - 4.4 ng/mL Final    C-Peptide reference interval is for fasting patients.   ??? Fructosamine 05/31/2014 427* 0 - 285 umol/L Final    Comment: Published reference interval for apparently healthy subjects  between age 58 and 23 is 86 - 285 umol/L and in a poorly  controlled diabetic population is 228 - 563 umol/L with a  mean of 396 umol/L.     Office Visit on 05/15/2014   Component Date Value Ref Range Status   ??? Summary 05/15/2014 FINAL   Final    Comment: Test                         Result       Flag       UNITS  ====================================================================  COMPLIANCE DRUG ANALYSIS,URINE,WITH MED REPORT    Creatinine                 208                     mg/dL                                                       Ref Range>=20  Drugs Present    Amphetamine                >7212                   ng/mg creat      Amphetamine is available as a schedule II prescription         drug.    Oxazepam                   1200                    ng/mg creat    Temazepam                  128                     ng/mg creat     Oxazepam and temazepam are benzodiazepine drugs, but may        also be present as common metabolites of other        benzodiazepine drugs, including diazepam and temazepam.    Alprazolam  73                      ng/mg creat    Alpha-hydroxyalprazolam    437                     ng/mg creat      Source of alprazolam is a scheduled prescription         medication. Alpha-hydroxyalprazolam                            is an expected         metabolite of alprazolam.    Ephedrine/Pseudoephedrine  PRESENT    Phenylpropanolamine        PRESENT      Source of ephedrine/pseudoephedrine is most commonly        pseudoephedrine in over-the-counter or prescription cold        and allergy medications. Phenylpropanolamine is an        expected metabolite of ephedrine/pseudoephedrine.    Duloxetine                 PRESENT    Fluoxetine                 PRESENT    Norfluoxetine              PRESENT     Norfluoxetine is an expected metabolite of fluoxetine.    Acetaminophen              PRESENT    Salicylate                 PRESENT    Naproxen                   PRESENT    Diphenhydramine            PRESENT    Declared Medications:      Not Provided     Medication list was not provided.              For clinical consultation, please call                        [phone].     Office Visit on 04/23/2014   Component Date Value Ref Range Status   ??? Microalbumin urine (POC) 04/23/2014 150   Final    CRE 200 mg/dL   ??? Microalbumin/creat ratio (POC) 04/23/2014 30-300   Final    Abnormal   ??? WBC 04/23/2014 9.8  3.4 - 10.8 x10E3/uL Final   ??? RBC 04/23/2014 4.24  3.77 - 5.28 x10E6/uL Final   ??? HGB 04/23/2014 11.5  11.1 - 15.9 g/dL Final   ??? HCT 04/23/2014 35.7  34.0 - 46.6 % Final   ??? MCV 04/23/2014 84  79 - 97 fL Final   ??? MCH 04/23/2014 27.1  26.6 - 33.0 pg Final   ??? MCHC 04/23/2014 32.2  31.5 - 35.7 g/dL Final   ??? RDW 04/23/2014 16.8* 12.3 - 15.4 % Final   ??? PLATELET 04/23/2014 321  150 - 379 x10E3/uL Final   ??? NEUTROPHILS 04/23/2014 65   Final   ??? Lymphocytes 04/23/2014 27   Final   ??? MONOCYTES 04/23/2014 7   Final   ??? EOSINOPHILS 04/23/2014 1   Final   ??? BASOPHILS 04/23/2014 0   Final   ??? ABS. NEUTROPHILS  04/23/2014 6.3  1.4 - 7.0 x10E3/uL Final   ??? Abs Lymphocytes 04/23/2014 2.7  0.7 - 3.1 x10E3/uL Final   ??? ABS. MONOCYTES 04/23/2014 0.7  0.1 - 0.9 x10E3/uL Final   ??? ABS. EOSINOPHILS 04/23/2014 0.1  0.0 - 0.4 x10E3/uL Final   ??? ABS. BASOPHILS 04/23/2014 0.0  0.0 - 0.2 x10E3/uL Final   ??? IMMATURE GRANULOCYTES 04/23/2014 0   Final    ??? ABS. IMM. GRANS. 04/23/2014 0.0  0.0 - 0.1 x10E3/uL Final   ??? Glucose 04/23/2014 268* 65 - 99 mg/dL Final   ??? BUN 04/23/2014 10  6 - 20 mg/dL Final   ??? Creatinine 04/23/2014 0.66  0.57 - 1.00 mg/dL Final   ??? GFR est non-AA 04/23/2014 114  >59 mL/min/1.73 Final   ??? GFR est AA 04/23/2014 131  >59 mL/min/1.73 Final   ??? BUN/Creatinine ratio 04/23/2014 15  8 - 20 Final   ??? Sodium 04/23/2014 140  134 - 144 mmol/L Final   ??? Potassium 04/23/2014 3.8  3.5 - 5.2 mmol/L Final   ??? Chloride 04/23/2014 96* 97 - 108 mmol/L Final   ??? CO2 04/23/2014 23  18 - 29 mmol/L Final   ??? Calcium 04/23/2014 8.6* 8.7 - 10.2 mg/dL Final   ??? Protein, total 04/23/2014 6.6  6.0 - 8.5 g/dL Final   ??? Albumin 04/23/2014 3.8  3.5 - 5.5 g/dL Final   ??? GLOBULIN, TOTAL 04/23/2014 2.8  1.5 - 4.5 g/dL Final   ??? A-G Ratio 04/23/2014 1.4  1.1 - 2.5 Final   ??? Bilirubin, total 04/23/2014 0.2  0.0 - 1.2 mg/dL Final   ??? Alk. phosphatase 04/23/2014 136* 39 - 117 IU/L Final   ??? AST 04/23/2014 26  0 - 40 IU/L Final   ??? ALT 04/23/2014 27  0 - 32 IU/L Final   ??? Cholesterol, total 04/23/2014 186  100 - 199 mg/dL Final   ??? Triglyceride 04/23/2014 137  0 - 149 mg/dL Final   ??? HDL Cholesterol 04/23/2014 79  >39 mg/dL Final    Comment: According to ATP-III Guidelines, HDL-C >59 mg/dL is considered a  negative risk factor for CHD.     ??? VLDL, calculated 04/23/2014 27  5 - 40 mg/dL Final   ??? LDL, calculated 04/23/2014 80  0 - 99 mg/dL Final   ??? VITAMIN D, 25-HYDROXY 04/23/2014 4.4* 30.0 - 100.0 ng/mL Final    Comment: Vitamin D deficiency has been defined by the [place] practice guideline as a  level of serum 25-OH vitamin D less than 20 ng/mL (1,2).  The Endocrine Society went on to further define vitamin D  insufficiency as a level between 21 and 29 ng/mL (2).  1. IOM (Institute of Medicine). 2010. Dietary reference     intakes for calcium and D. [place]: The     [employer].   2. Holick MF, Binkley NC, Bischoff-Ferrari HA, et al.     Evaluation, treatment, and prevention of vitamin D     deficiency: an Endocrine Society clinical practice     guideline. JCEM. 2011 Jul; 96(7):1911-30.     ??? TSH-ICMA 04/23/2014 0.78   Final    Comment: Reference Range:  Pubertal Children and Adults: 0.5 - 4.8     ??? Free T4 04/23/2014 0.90   Final    Comment: Reference Range:  Pubertal Children  and Adults:          0.8 - 1.7  Pregnant  Females  1st Trimester  (0-13.3 wks):       0.65 - 1.4  2nd Trimester  (13.4-26.6 wks):     0.5 - 1.3  3rd Trimester  (>26.6 wks):         0.5 - 1.1     ??? Hemoglobin A1c (POC) 04/23/2014 14.6   Final   ??? Glucose dose-GTT 04/23/2014 280   Final   ??? Fungus Stain 04/23/2014 KOH/Calcofluor preparation:  no fungus observed.   Final   ??? Glutamic Acid Decarboxylase Ab 04/23/2014 <0.5   Final    Comment: Reference Range:  <0.5        Negative  > or = 0.5  Positive     ??? INTERPRETATION 04/23/2014 Note   Final    Supplement report is available.   Admission on 04/19/2014, Discharged on 04/19/2014   Component Date Value Ref Range Status   ??? Glucose (POC) 04/19/2014 309* 70 - 110 mg/dL Final    Comment: (NOTE)  The FDA has indicated that no capillary point of care blood glucose   monitoring systems are approved for use in "critically ill" patients,   however they have not defined this population. The College of   American Pathologists has recommended that these devices should not   be used in cases such as severe hypotension, dehydration, shock, and   hyperglycemic-hyperosmolar state, amongst others.  Venous or arterial   collection is the recommended specimen for testing these patients.     ??? Color 04/19/2014 YELLOW   Final   ??? Appearance 04/19/2014 CLEAR   Final   ??? Specific gravity 04/19/2014 1.010  1.003 - 1.030   Final   ??? pH (UA) 04/19/2014 6.0  5.0 - 8.0   Final   ??? Protein 04/19/2014 NEGATIVE   NEG mg/dL Final   ??? Glucose 04/19/2014 >1000* NEG mg/dL Final    ??? Ketone 04/19/2014 NEGATIVE   NEG mg/dL Final   ??? Bilirubin 04/19/2014 NEGATIVE   NEG   Final   ??? Blood 04/19/2014 NEGATIVE   NEG   Final   ??? Urobilinogen 04/19/2014 0.2  0.2 - 1.0 EU/dL Final   ??? Nitrites 04/19/2014 NEGATIVE   NEG   Final   ??? Leukocyte Esterase 04/19/2014 NEGATIVE   NEG   Final   ??? Special Requests: 04/19/2014 NO SPECIAL REQUESTS   Final   ??? Culture result: 04/19/2014    Final                    Value:80000  COLONIES/mL  ESCHERICHIA COLI     ??? Culture result: 04/19/2014    Final                    Value:80000  ESCHERICHIA COLI  2ND MORPHOTYPE     ??? Culture result: 04/19/2014    Final                    Value:60000  COLONIES/mL  MIXED GRAM POSITIVE FLORA, PROBABLE SKIN/GENITAL CONTAMINATION.     ??? HCG urine, Ql. 04/19/2014 NEGATIVE   NEG   Final    Test results should be confirmed using serum quantitative hCG when detection of pregnancy is critical and before performing any critical medical procedure.         XR Results (maximum last 3):    Results from Appointment encounter on 06/21/14   XR KNEE RT 3 V   Narrative Right Knee, 4 Views  CPT Code [id number]    Clinical History:  Injury.    Comparison:  None.    Findings:    AP, lateral, and bilateral oblique views of the right knee are submitted for  evaluation.      There is no evidence of acute fracture or dislocation.  No joint effusion is  identified.  Osseous mineralization is normal.  There are tricompartment  osteophytes without significant joint space narrowing.           Impression IMPRESSION:    1.  No acute osseous abnormality.  2.  Mild degenerative changes.          MRI Results (maximum last 3):  No results found for this or any previous visit.    [place] Results (maximum last 3):  No results found for this or any previous visit.    MAM Results (maximum last 3):  No results found for this or any previous visit.        Assessment/ Plan    Lakeitha was seen today for knee pain.    Diagnoses and associated orders for this visit:     Knee injury, right, sequela    Coughing  - XR CHEST PA LAT; Future    Bipolar disorder without psychotic features [facility])    Attention deficit disorder    Anxiety           Follow-up Disposition: Not on File  I have reviewed return/Er/medication precautions and  discussed the diagnosis with the patient (or guardian/parent if applicable)  and the intended plan as seen in the above orders.  The patient has been offered and/or received an after-visit summary and questions were answered concerning future plans. Patient verbalized understanding of all instruction.    ***    Medication Side Effects and Warnings were discussed with patient: {yes/ no default yes:19425::"yes"}  Patient Labs were reviewed: {yes/ no default yes:19425::"yes"}  Patient Past Records were reviewed:  {yes/ no default yes:19425::"yes"}  ***    Jeannine Boga, MD

## 2014-06-26 NOTE — Patient Instructions (Signed)
Bipolar Disorder: After Your Visit  Your Care Instructions  Bipolar disorder is an illness that causes extreme mood changes, from times of very high energy (manic episodes) to times of depression. But many people with bipolar disorder show only the symptoms of depression. These moods may cause problems with your work, school, family life, friendships, and how well you function.  This disease is also called manic-depression.  There is no cure for bipolar disorder, but it can be helped with medicines. Counseling may also help. It is important to take your medicines exactly as prescribed, even when you feel well. You may need lifelong treatment.  Follow-up care is a key part of your treatment and safety. Be sure to make and go to all appointments, and call your doctor if you are having problems. It's also a good idea to know your test results and keep a list of the medicines you take.  How can you care for yourself at home?  ?? Be safe with medicines. Take your medicines exactly as prescribed. Do not stop or change a medicine without talking to your doctor first. You and your doctor may need to try different combinations of medicines to find what works for you.  ?? Take your medicines on schedule to keep your moods even. When you feel good, you may think that you do not need your medicines. But it is important to keep taking them.  ?? Go to your counseling sessions. Call and talk with your counselor if you can't go to a session or if you don't think the sessions are helping. Do not just stop going.  ?? Get at least 30 minutes of activity on most days of the week. Walking is a good choice. You also may want to do other things, such as running, swimming, or cycling.  ?? Get enough sleep. Keep your room dark and quiet. Try to go to bed at the same time every night.  ?? Eat a healthy diet. This includes whole grains, dairy, fruits, vegetables, and protein. Eat foods from each of these groups.   ?? Try to lower your stress. Manage your time, build a strong support system, and lead a healthy lifestyle. To lower your stress, try physical activity, slow deep breathing, or getting a massage.  ?? Do not use alcohol or illegal drugs.  ?? Learn the early signs of your mood changes. You can then take steps to help yourself feel better.  ?? Ask for help from friends and family when you need it. You may need help with daily chores when you are depressed. When you are manic, you may need support to control your high energy levels.  What should you do if someone in your family has bipolar disorder?  ?? Learn about the disease and the signs that it is getting worse.  ?? Remind your family member that you love him or her.  ?? Make a plan with all family members about how to take care of your loved one when his or her symptoms are bad.  ?? Talk about your fears and concerns and those of other family members. Seek counseling if needed.  ?? Do not focus attention only on the person who is in treatment.  ?? Remind yourself that it will take time for changes to occur.  ?? Do not blame yourself for the disease.  ?? Know your legal rights and the legal rights of your family member. Support groups or counselors can help you with this information.  ??   Take care of yourself. Keep up with your own interests, such as your career, hobbies, and friends. Use exercise, positive self-talk, deep breathing, and other relaxing exercises to help lower your stress.  ?? Give yourself time to grieve. You may need to deal with emotions such as anger, fear, and frustration. After you work through your feelings, you will be better able to care for yourself and your family.  ?? If you are having a hard time with your feelings or with your relationship with your family member, talk with a counselor.  When should you call for help?  Call 911 anytime you think you may need emergency care. For example, call if:  ?? You feel like hurting yourself or someone else.   ?? Someone who has bipolar disorder displays dangerous behavior, and you think the person might hurt himself or herself or someone else.  Call your doctor now or seek immediate medical care if:  ?? You hear voices.  ?? Someone you know has bipolar disorder and talks about suicide. Keep the numbers for these national suicide hotlines: 1-800-273-TALK 302-297-6171(1-531-162-9258) and 1-800-SUICIDE 413-551-2204(1-5675689273). If a suicide threat seems real, with a specific plan and a way to carry it out, stay with the person, or ask someone you trust to stay with the person, until you can get help.  ?? Someone you know has bipolar disorder and:  ?? Starts to give away possessions.  ?? Is using illegal drugs or drinking alcohol heavily.  ?? Talks or writes about death, including writing suicide notes or talking about guns, knives, or pills.  ?? Talks or writes about hurting someone else.  ?? Starts to spend a lot of time alone.  ?? Acts very aggressively or suddenly appears calm.  ?? Talks about beliefs that are not based in reality (delusions).  Watch closely for changes in your health, and be sure to contact your doctor if:  ?? You cannot go to your counseling sessions.   Where can you learn more?   Go to [email or web address]  Enter K052 in the search box to learn more about "Bipolar Disorder: After Your Visit."   ?? 2006-2015 [employer]. Care instructions adapted under license by [employer] (which disclaims liability or warranty for this information). This care instruction is for use with your licensed healthcare professional. If you have questions about a medical condition or this instruction, always ask your healthcare professional. [employer], [employer] disclaims any warranty or liability for your use of this information.  Content Version: 10.5.422740; Current as of: October 26, 2013              Cough: After Your Visit  Your Care Instructions   A cough is your body's response to something that bothers your throat or airways. Many things can cause a cough. You might cough because of a cold or the flu, bronchitis, or asthma. Smoking, postnasal drip, allergies, and stomach acid that backs up into your throat also can cause coughs.  A cough is a symptom, not a disease. Most coughs stop when the cause, such as a cold, goes away. You can take a few steps at home to cough less and feel better.  Follow-up care is a key part of your treatment and safety. Be sure to make and go to all appointments, and call your doctor if you are having problems. It's also a good idea to know your test results and keep a list of the medicines you take.  How can you care for yourself  at home?  ?? Drink lots of water and other fluids. This helps thin the mucus and soothes a dry or sore throat. Honey or lemon juice in hot water or tea may ease a dry cough.  ?? Take cough medicine as directed by your doctor.  ?? Prop up your head on pillows to help you breathe and ease a dry cough.  ?? Try cough drops to soothe a dry or sore throat. Cough drops don't stop a cough. Medicine-flavored cough drops are no better than candy-flavored drops or hard candy.  ?? Do not smoke. Avoid secondhand smoke. If you need help quitting, talk to your doctor about stop-smoking programs and medicines. These can increase your chances of quitting for good.  When should you call for help?  Call 911 anytime you think you may need emergency care. For example, call if:  ?? You have severe trouble breathing.  Call your doctor now or seek immediate medical care if:  ?? You cough up blood.  ?? You have new or worse trouble breathing.  ?? You have a new or higher fever.  ?? You have a new rash.  Watch closely for changes in your health, and be sure to contact your doctor if:  ?? You cough more deeply or more often, especially if you notice more mucus or a change in the color of your mucus.   ?? You have new symptoms, such as a sore throat, an earache, or sinus pain.  ?? You do not get better as expected.   Where can you learn more?   Go to [email or web address]  Enter D279 in the search box to learn more about "Cough: After Your Visit."   ?? 2006-2015 [employer]. Care instructions adapted under license by [employer] (which disclaims liability or warranty for this information). This care instruction is for use with your licensed healthcare professional. If you have questions about a medical condition or this instruction, always ask your healthcare professional. [employer], [employer] disclaims any warranty or liability for your use of this information.  Content Version: 10.5.422740; Current as of: August 21, 2013

## 2014-06-26 NOTE — Progress Notes (Signed)
1. Have you been to the ER, urgent care clinic since your last visit?  Hospitalized since your last visit?no     2. Have you seen or consulted any other health care providers outside of the [facility] since your last visit?  Include any pap smears or colon screening. No    Patient Alejandra Fletcher is a 38 y.o. female presents today for follow up for her knee injury last week. Patient xray returned patient has arthritis on right knee. Patient was placed in norco treatment and rest for a week. Patient stated that her knee has been feeling better, a little stiff, and some pain but better than last week.     Patient stated that it has been about 5 yrs since her last pap.

## 2014-06-26 NOTE — Progress Notes (Deleted)
HPI  Alejandra Fletcher is a 38 y.o. female that presents with  Chief Complaint   Patient presents with   ??? Knee Pain     Symptom onset has been {gen onset/course:315708} for a time period of *** {gen duration:315003}. Severity is described as {gen severity:315014}. Course of her symptoms over time is {gen onset/course:315708}.  History obtained from {source of history:310783}.  ***    Review of Systems:  ROS:  History obtained from the patient  ?? General: negative for - chills, fever, weight changes or malaise, or fatigue  ?? HEENT: no sore throat, nasal congestion,  vision problems or ear problems  ?? Respiratory: no cough, shortness of breath, or wheezing  ?? Cardiovascular: no chest pain, palpitations, or dyspnea on exertion  ?? Gastrointestinal: no abdominal pain, N/V, change in bowel habits, or black or bloody stools  ?? Genitourinary:  No dysuria or hematuria  ?? Musculoskeletal: no back pain, joint pain, joint stiffness, muscle pain or muscle weakness  ?? Neurological: no numbness, tingling, headache or dizziness, or seizures  ?? Dermatological:  No rashes or other skin concerns  ?? Psychological: negative for - anxiety, depression, sleep disturbances, suicidal or homicidal ideations      Past Medical History   Diagnosis Date   ??? Gastric ulcer    ??? Gastrointestinal disorder      infections   ??? Diabetes ([place])    ??? Fibromyalgia    ??? 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 GERD (gastroesophageal reflux disease)    ??? Bipolar affective disorder ([place])        Family History   Problem Relation Age of Onset   ??? Fainting Mother    ??? Hypertension Mother    ??? Asthma Mother    ??? Obesity Mother    ??? Diabetes Father    ??? Hypertension Father    ??? Gout Father    ??? Alcohol abuse Paternal Grandfather    ??? Other Paternal Grandfather      Nicotine abuse/ Bronchitis   ??? Heart Disease Paternal Grandmother    ??? Heart Surgery Paternal Grandmother    ??? Alzheimer Paternal Grandmother    ??? No Known Problems Sister     ??? No Known Problems Maternal Grandmother    ??? No Known Problems Maternal Grandfather        Past Surgical History   Procedure Laterality Date   ??? Hx cesarean section     ??? Hx gastric bypass     ??? Hx knee arthroscopy     ??? Hx cholecystectomy         History     Social History   ??? Marital Status: MARRIED     Spouse Name: N/A     Number of Children: N/A   ??? Years of Education: N/A     Occupational History   ??? Not on file.     Social History Main Topics   ??? Smoking status: Current Some Day Smoker -- 0.50 packs/day   ??? Smokeless tobacco: Never Used   ??? Alcohol Use: 0.0 oz/week     0 Not specified per week      Comment: occasionally   ??? Drug Use: No   ??? Sexual Activity:     Partners: Male     Patent examiner Protection: None     Other Topics Concern   ??? Military Service No   ??? Blood Transfusions Yes  2004, May 10...after delivery of child   ??? Caffeine Concern No   ??? Occupational Exposure No   ??? Hobby Hazards No   ??? Sleep Concern Yes     Insomnia   ??? Stress Concern Yes     life stressors   ??? Weight Concern Yes     over weight   ??? Special Diet Yes     lifestyle, food changes   ??? Back Care Yes     PT for back problems   ??? Exercise No   ??? Bike Helmet No   ??? Seat Belt Yes   ??? Self-Exams Yes     Social History Narrative       Outpatient Prescriptions Marked as Taking for the 06/26/14 encounter (Office Visit) with Penelope GalasLynnette Kessie Croston, MD   Medication Sig Dispense Refill   ??? HYDROcodone-acetaminophen (NORCO) 5-325 mg per tablet Take one tab po Q6h/PRN 30 Tab 0   ??? dextroamphetamine-amphetamine (ADDERALL) 20 mg tablet Take 1 Tab by mouth four (4) times daily. Max Daily Amount: 80 mg. 28 Tab 0   ??? ALPRAZolam (XANAX) 1 mg tablet Take 1 Tab by mouth four (4) times daily as needed for Anxiety. Max Daily Amount: 4 mg. Drowsiness precautions. Avoid alcohol. 28 Tab 0   ??? labetalol (NORMODYNE) 100 mg tablet Take 1 Tab by mouth two (2) times a day. 60 Tab 1   ??? ESOMEPRAZOLE MAGNESIUM (NEXIUM 24HR PO) Take 1 Cap by mouth. daily      ??? FLUoxetine (PROZAC) 40 mg capsule Take 40 mg by mouth daily.     ??? divalproex ER (DEPAKOTE ER) 500 mg ER tablet Take 500 mg by mouth two (2) times a day.     ??? Insulin Needles, Disposable, (NANO PEN NEEDLE) 32 x 5/32 " ndle 1 Device.     ??? metFORMIN (GLUCOPHAGE) 500 mg tablet Take 1 Tab by mouth two (2) times daily (with meals). For glucose control 60 Tab 0   ??? insulin detemir (LEVEMIR) 100 unit/mL (3 mL) pen 37 units at night 1 Package 0   ??? insulin aspart (NOVOLOG FLEXPEN) 100 unit/mL flexpen 0-150 = 0 units  151-200 = 2 units  201-250 = 4 units  251-300 = 6 units  301-350 = 8 units  351-400 = 10 units  401+ -> Come to the Emergency Department 1 Package 0   ??? DULoxetine (CYMBALTA) 60 mg capsule Take 120 mg by mouth two (2) times a day.         Allergies   Allergen Reactions   ??? Topiramate Anxiety   ??? Ace Inhibitors Nausea and Vomiting   ??? Nsaids (Non-Steroidal Anti-Inflammatory Drug) Nausea and Vomiting       Filed Vitals:    06/26/14 1603 06/26/14 1608   BP: 126/96 132/98   Pulse: 109 96   Temp: 96.9 ??F (36.1 ??C)    TempSrc: Oral    Resp: 20    Height: 5' 1.42" (1.56 m)    Weight: 214 lb (97.07 kg)    SpO2: 97%          Physical Examination: General appearance - alert, well appearing, and in no distress    Mental status - alert, oriented to person, place, and time  Eyes - pupils equal and reactive, extraocular eye movements intact; no redness, drainage, or jaundice  Ears - bilateral TM's and external ear canals normal  Nose - normal and patent, no erythema or discharge , sinuses normal and nontender   Mouth -  mucous membranes moist, pharynx normal without lesions; no quinsy, stridor, swelling, or drooling   Neck - supple, no significant adenopathy  Lymphatics - no palpable lymphadenopathy  PULM: clear to auscultation, no wheezes, rales or rhonchi, symmetric air entry  CVS - normal rate, regular rhythm, normal S1, S2, no murmurs, rubs, clicks or gallops   GI - soft, nontender, nondistended, no masses or organomegaly   Back exam - full range of motion, no tenderness, palpable spasm or pain on motion  Neurological - alert, oriented, normal speech, no focal findings or movement disorder noted   Musculoskeletal - no joint tenderness, deformity or swelling  Extremities - no pedal edema noted, good pulses, normal color, temperature   Skin - normal coloration and turgor, no rashes noted or suspicious skin lesions noted on exposed surfaces    LABS/IMAGING:  Office Visit on 06/15/2014   Component Date Value Ref Range Status   ??? VALID INTERNAL CONTROL POC 06/15/2014 Yes   Final   ??? HCG urine, Ql. (POC) 06/15/2014 Negative  Negative Final   Office Visit on 05/31/2014   Component Date Value Ref Range Status   ??? Glutamic Acid Decarboxylase Ab 05/31/2014 <0.5   Final    Comment: Reference Range:  <0.5        Negative  > or = 0.5  Positive     ??? C-Peptide 05/31/2014 1.3  1.1 - 4.4 ng/mL Final    C-Peptide reference interval is for fasting patients.   ??? Fructosamine 05/31/2014 427* 0 - 285 umol/L Final    Comment: Published reference interval for apparently healthy subjects  between age 52 and 37 is 6 - 285 umol/L and in a poorly  controlled diabetic population is 228 - 563 umol/L with a  mean of 396 umol/L.     Office Visit on 05/15/2014   Component Date Value Ref Range Status   ??? Summary 05/15/2014 FINAL   Final    Comment: Test                         Result       Flag       UNITS  ====================================================================  COMPLIANCE DRUG ANALYSIS,URINE,WITH MED REPORT    Creatinine                 208                     mg/dL                                                       Ref Range>=20  Drugs Present    Amphetamine                >7212                   ng/mg creat      Amphetamine is available as a schedule II prescription         drug.    Oxazepam                   1200                    ng/mg creat     Temazepam  128                     ng/mg creat     Oxazepam and temazepam are benzodiazepine drugs, but may        also be present as common metabolites of other        benzodiazepine drugs, including diazepam and temazepam.    Alprazolam                 73                      ng/mg creat    Alpha-hydroxyalprazolam    437                     ng/mg creat      Source of alprazolam is a scheduled prescription         medication. Alpha-hydroxyalprazolam                            is an expected         metabolite of alprazolam.    Ephedrine/Pseudoephedrine  PRESENT    Phenylpropanolamine        PRESENT     Source of ephedrine/pseudoephedrine is most commonly        pseudoephedrine in over-the-counter or prescription cold        and allergy medications. Phenylpropanolamine is an        expected metabolite of ephedrine/pseudoephedrine.    Duloxetine                 PRESENT    Fluoxetine                 PRESENT    Norfluoxetine              PRESENT     Norfluoxetine is an expected metabolite of fluoxetine.    Acetaminophen              PRESENT    Salicylate                 PRESENT    Naproxen                   PRESENT    Diphenhydramine            PRESENT    Declared Medications:      Not Provided     Medication list was not provided.              For clinical consultation, please call                        [phone].     Office Visit on 04/23/2014   Component Date Value Ref Range Status   ??? Microalbumin urine (POC) 04/23/2014 150   Final    CRE 200 mg/dL   ??? Microalbumin/creat ratio (POC) 04/23/2014 30-300   Final    Abnormal   ??? WBC 04/23/2014 9.8  3.4 - 10.8 x10E3/uL Final   ??? RBC 04/23/2014 4.24  3.77 - 5.28 x10E6/uL Final   ??? HGB 04/23/2014 11.5  11.1 - 15.9 g/dL Final   ??? HCT 04/23/2014 35.7  34.0 - 46.6 % Final   ??? MCV 04/23/2014 84  79 - 97 fL Final   ??? MCH 04/23/2014 27.1  26.6 - 33.0 pg Final   ???  MCHC 04/23/2014 32.2  31.5 - 35.7 g/dL Final   ??? RDW 04/23/2014 16.8* 12.3 - 15.4 % Final    ??? PLATELET 04/23/2014 321  150 - 379 x10E3/uL Final   ??? NEUTROPHILS 04/23/2014 65   Final   ??? Lymphocytes 04/23/2014 27   Final   ??? MONOCYTES 04/23/2014 7   Final   ??? EOSINOPHILS 04/23/2014 1   Final   ??? BASOPHILS 04/23/2014 0   Final   ??? ABS. NEUTROPHILS 04/23/2014 6.3  1.4 - 7.0 x10E3/uL Final   ??? Abs Lymphocytes 04/23/2014 2.7  0.7 - 3.1 x10E3/uL Final   ??? ABS. MONOCYTES 04/23/2014 0.7  0.1 - 0.9 x10E3/uL Final   ??? ABS. EOSINOPHILS 04/23/2014 0.1  0.0 - 0.4 x10E3/uL Final   ??? ABS. BASOPHILS 04/23/2014 0.0  0.0 - 0.2 x10E3/uL Final   ??? IMMATURE GRANULOCYTES 04/23/2014 0   Final   ??? ABS. IMM. GRANS. 04/23/2014 0.0  0.0 - 0.1 x10E3/uL Final   ??? Glucose 04/23/2014 268* 65 - 99 mg/dL Final   ??? BUN 04/23/2014 10  6 - 20 mg/dL Final   ??? Creatinine 04/23/2014 0.66  0.57 - 1.00 mg/dL Final   ??? GFR est non-AA 04/23/2014 114  >59 mL/min/1.73 Final   ??? GFR est AA 04/23/2014 131  >59 mL/min/1.73 Final   ??? BUN/Creatinine ratio 04/23/2014 15  8 - 20 Final   ??? Sodium 04/23/2014 140  134 - 144 mmol/L Final   ??? Potassium 04/23/2014 3.8  3.5 - 5.2 mmol/L Final   ??? Chloride 04/23/2014 96* 97 - 108 mmol/L Final   ??? CO2 04/23/2014 23  18 - 29 mmol/L Final   ??? Calcium 04/23/2014 8.6* 8.7 - 10.2 mg/dL Final   ??? Protein, total 04/23/2014 6.6  6.0 - 8.5 g/dL Final   ??? Albumin 04/23/2014 3.8  3.5 - 5.5 g/dL Final   ??? GLOBULIN, TOTAL 04/23/2014 2.8  1.5 - 4.5 g/dL Final   ??? A-G Ratio 04/23/2014 1.4  1.1 - 2.5 Final   ??? Bilirubin, total 04/23/2014 0.2  0.0 - 1.2 mg/dL Final   ??? Alk. phosphatase 04/23/2014 136* 39 - 117 IU/L Final   ??? AST 04/23/2014 26  0 - 40 IU/L Final   ??? ALT 04/23/2014 27  0 - 32 IU/L Final   ??? Cholesterol, total 04/23/2014 186  100 - 199 mg/dL Final   ??? Triglyceride 04/23/2014 137  0 - 149 mg/dL Final   ??? HDL Cholesterol 04/23/2014 79  >39 mg/dL Final    Comment: According to ATP-III Guidelines, HDL-C >59 mg/dL is considered a  negative risk factor for CHD.     ??? VLDL, calculated 04/23/2014 27  5 - 40 mg/dL Final    ??? LDL, calculated 04/23/2014 80  0 - 99 mg/dL Final   ??? VITAMIN D, 25-HYDROXY 04/23/2014 4.4* 30.0 - 100.0 ng/mL Final    Comment: Vitamin D deficiency has been defined by the [place] practice guideline as a  level of serum 25-OH vitamin D less than 20 ng/mL (1,2).  The Endocrine Society went on to further define vitamin D  insufficiency as a level between 21 and 29 ng/mL (2).  1. IOM (Institute of Medicine). 2010. Dietary reference     intakes for calcium and D. [place]: The     [employer].  2. Holick MF, Binkley NC, Bischoff-Ferrari HA, et al.     Evaluation, treatment, and prevention of vitamin D  deficiency: an Endocrine Society clinical practice     guideline. JCEM. 2011 Jul; 96(7):1911-30.     ??? TSH-ICMA 04/23/2014 0.78   Final    Comment: Reference Range:  Pubertal Children and Adults: 0.5 - 4.8     ??? Free T4 04/23/2014 0.90   Final    Comment: Reference Range:  Pubertal Children  and Adults:          0.8 - 1.7  Pregnant Females  1st Trimester  (0-13.3 wks):       0.65 - 1.4  2nd Trimester  (13.4-26.6 wks):     0.5 - 1.3  3rd Trimester  (>26.6 wks):         0.5 - 1.1     ??? Hemoglobin A1c (POC) 04/23/2014 14.6   Final   ??? Glucose dose-GTT 04/23/2014 280   Final   ??? Fungus Stain 04/23/2014 KOH/Calcofluor preparation:  no fungus observed.   Final   ??? Glutamic Acid Decarboxylase Ab 04/23/2014 <0.5   Final    Comment: Reference Range:  <0.5        Negative  > or = 0.5  Positive     ??? INTERPRETATION 04/23/2014 Note   Final    Supplement report is available.   Admission on 04/19/2014, Discharged on 04/19/2014   Component Date Value Ref Range Status   ??? Glucose (POC) 04/19/2014 309* 70 - 110 mg/dL Final    Comment: (NOTE)  The FDA has indicated that no capillary point of care blood glucose   monitoring systems are approved for use in "critically ill" patients,   however they have not defined this population. The College of    American Pathologists has recommended that these devices should not   be used in cases such as severe hypotension, dehydration, shock, and   hyperglycemic-hyperosmolar state, amongst others.  Venous or arterial   collection is the recommended specimen for testing these patients.     ??? Color 04/19/2014 YELLOW   Final   ??? Appearance 04/19/2014 CLEAR   Final   ??? Specific gravity 04/19/2014 1.010  1.003 - 1.030   Final   ??? pH (UA) 04/19/2014 6.0  5.0 - 8.0   Final   ??? Protein 04/19/2014 NEGATIVE   NEG mg/dL Final   ??? Glucose 04/19/2014 >1000* NEG mg/dL Final   ??? Ketone 04/19/2014 NEGATIVE   NEG mg/dL Final   ??? Bilirubin 04/19/2014 NEGATIVE   NEG   Final   ??? Blood 04/19/2014 NEGATIVE   NEG   Final   ??? Urobilinogen 04/19/2014 0.2  0.2 - 1.0 EU/dL Final   ??? Nitrites 04/19/2014 NEGATIVE   NEG   Final   ??? Leukocyte Esterase 04/19/2014 NEGATIVE   NEG   Final   ??? Special Requests: 04/19/2014 NO SPECIAL REQUESTS   Final   ??? Culture result: 04/19/2014    Final                    Value:80000  COLONIES/mL  ESCHERICHIA COLI     ??? Culture result: 04/19/2014    Final                    Value:80000  ESCHERICHIA COLI  2ND MORPHOTYPE     ??? Culture result: 04/19/2014    Final                    Value:60000  COLONIES/mL  MIXED GRAM POSITIVE FLORA, PROBABLE SKIN/GENITAL CONTAMINATION.     ???  HCG urine, Ql. 04/19/2014 NEGATIVE   NEG   Final    Test results should be confirmed using serum quantitative hCG when detection of pregnancy is critical and before performing any critical medical procedure.         XR Results (maximum last 3):    Results from Appointment encounter on 06/21/14   XR KNEE RT 3 V   Narrative Right Knee, 4 Views    CPT Code [phone]    Clinical History:  Injury.    Comparison:  None.    Findings:    AP, lateral, and bilateral oblique views of the right knee are submitted for  evaluation.      There is no evidence of acute fracture or dislocation.  No joint effusion is   identified.  Osseous mineralization is normal.  There are tricompartment  osteophytes without significant joint space narrowing.           Impression IMPRESSION:    1.  No acute osseous abnormality.  2.  Mild degenerative changes.          MRI Results (maximum last 3):  No results found for this or any previous visit.    [place] Results (maximum last 3):  No results found for this or any previous visit.    MAM Results (maximum last 3):  No results found for this or any previous visit.        Assessment/ Plan    Encarnacion was seen today for knee pain.    Diagnoses and associated orders for this visit:    Knee injury, right, sequela    Coughing  - XR CHEST PA LAT; Future    Bipolar disorder without psychotic features [facility])    Attention deficit disorder    Anxiety           Follow-up Disposition: Not on File  I have reviewed return/Er/medication precautions and  discussed the diagnosis with the patient (or guardian/parent if applicable)  and the intended plan as seen in the above orders.  The patient has been offered and/or received an after-visit summary and questions were answered concerning future plans. Patient verbalized understanding of all instruction.    ***    Medication Side Effects and Warnings were discussed with patient: {yes/ no default yes:19425::"yes"}  Patient Labs were reviewed: {yes/ no default yes:19425::"yes"}  Patient Past Records were reviewed:  {yes/ no default yes:19425::"yes"}  ***    Jeannine Boga, MD

## 2014-06-27 NOTE — Progress Notes (Signed)
Quick Note:        Advise pt cxr is normal    ______

## 2014-06-28 NOTE — Telephone Encounter (Addendum)
Spoke with patient about result below. Patient stated understanding. Closing encounter.      ----- Message from Penelope Galas, MD sent at 06/27/2014  4:07 PM EDT -----  Advise pt cxr is normal

## 2014-07-03 NOTE — Telephone Encounter (Signed)
Per dr. Christell Constant, this has already been discussed. Closing encounter

## 2014-07-06 NOTE — Telephone Encounter (Signed)
Left message regarding below. Advised pt to return call.

## 2014-07-06 NOTE — Telephone Encounter (Signed)
-----   Message from Preston Fleeting, MD sent at 06/26/2014  9:11 AM EDT -----  Pls let patient know her knee XR was normal except for very mild arthritis. Plan remains the same and as long as she is getting better on a weekly basis she does not need to do anything else. Plan to do PT  If not getting better after 3-4 weeks or more. Thanks.

## 2014-07-17 NOTE — Telephone Encounter (Signed)
Second attempt: Left message regarding below. Advised pt to return call. Letter sent, closing encounter.

## 2014-12-11 ENCOUNTER — Ambulatory Visit
Admit: 2014-12-11 | Discharge: 2014-12-11 | Payer: PRIVATE HEALTH INSURANCE | Attending: Family Medicine | Primary: Family Medicine

## 2014-12-11 DIAGNOSIS — O0991 Supervision of high risk pregnancy, unspecified, first trimester: Secondary | ICD-10-CM

## 2014-12-11 MED ORDER — LABETALOL 100 MG TAB
100 mg | ORAL_TABLET | Freq: Two times a day (BID) | ORAL | Status: DC
Start: 2014-12-11 — End: 2015-09-30

## 2014-12-11 NOTE — Progress Notes (Signed)
HISTORY OF PRESENT ILLNESS  Alejandra Fletcher is a 38 y.o. female.  HPI Comments: Pt comes in today for pregnancy. She says that she went to the ED on 12/22 with pelvic pain, rectal bleeding, and constipation, all of which has resolved now. She says that they told her she was pregnant, about 5-6 weeks. She explains that it has been about 12 years since her last child. She says that she spoke with [facility] and GYN and they gave her an appointment for 01/08/15. She says that she went there yesterday to get blood work done. She says that since 12/03/14 she has stopped all her meds except insulin and she has stopped smoking. She says that the OB/GYN gave her a work note with restrictions but her work requires more information and asked we could fill that out for her today.   She says that she has been taking Levemir 27 units nightly and Novolog 8 units three times daily. She says that her BS has been running in the 200s with 250 being the highest after meals and then her fasting BS has been around 175-180. She says that her BP has been running high as well.    Other  Pertinent negatives include no chest pain, no abdominal pain, no headaches and no shortness of breath.       Review of Systems   Constitutional: Negative for fever, chills, malaise/fatigue and diaphoresis.   HENT: Negative for congestion, ear pain, hearing loss, nosebleeds and sore throat.    Eyes: Negative for blurred vision, double vision, pain and redness.   Respiratory: Negative for cough, hemoptysis, sputum production, shortness of breath and wheezing.    Cardiovascular: Negative for chest pain, palpitations and leg swelling.   Gastrointestinal: Negative for nausea, vomiting, abdominal pain, diarrhea, constipation and blood in stool.   Genitourinary: Negative for dysuria and hematuria.   Musculoskeletal: Negative for myalgias, back pain and neck pain.   Skin: Negative for rash.    Neurological: Negative for dizziness, tingling, sensory change, seizures, loss of consciousness and headaches.   Endo/Heme/Allergies: Does not bruise/bleed easily.   Psychiatric/Behavioral: Negative for depression, memory loss and substance abuse. The patient is not nervous/anxious.      Past Medical History   Diagnosis Date   ??? Gastric ulcer    ??? Gastrointestinal disorder      infections   ??? Diabetes ([place])    ??? Fibromyalgia    ??? 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 GERD (gastroesophageal reflux disease)    ??? Bipolar affective disorder ([place])        Family History   Problem Relation Age of Onset   ??? Fainting Mother    ??? Hypertension Mother    ??? Asthma Mother    ??? Obesity Mother    ??? Diabetes Father    ??? Hypertension Father    ??? Gout Father    ??? Alcohol abuse Paternal Grandfather    ??? Other Paternal Grandfather      Nicotine abuse/ Bronchitis   ??? Heart Disease Paternal Grandmother    ??? Heart Surgery Paternal Grandmother    ??? Alzheimer Paternal Grandmother    ??? No Known Problems Sister    ??? No Known Problems Maternal Grandmother    ??? No Known Problems Maternal Grandfather        Past Surgical History   Procedure Laterality Date   ??? Hx cesarean section     ???  Hx gastric bypass     ??? Hx knee arthroscopy     ??? Hx cholecystectomy         History     Social History   ??? Marital Status: MARRIED     Spouse Name: N/A     Number of Children: N/A   ??? Years of Education: N/A     Occupational History   ??? Not on file.     Social History Main Topics   ??? Smoking status: Current Some Day Smoker -- 0.50 packs/day   ??? Smokeless tobacco: Never Used   ??? Alcohol Use: 0.0 oz/week     0 Not specified per week      Comment: occasionally   ??? Drug Use: No   ??? Sexual Activity:     Partners: Male     Patent examiner Protection: None     Other Topics Concern   ??? Military Service No   ??? Blood Transfusions Yes     2004, May 10...after delivery of child   ??? Caffeine Concern No   ??? Occupational Exposure No    ??? Hobby Hazards No   ??? Sleep Concern Yes     Insomnia   ??? Stress Concern Yes     life stressors   ??? Weight Concern Yes     over weight   ??? Special Diet Yes     lifestyle, food changes   ??? Back Care Yes     PT for back problems   ??? Exercise No   ??? Bike Helmet No   ??? Seat Belt Yes   ??? Self-Exams Yes     Social History Narrative       Current Outpatient Prescriptions   Medication Sig Dispense Refill   ??? labetalol (NORMODYNE) 100 mg tablet Take 1 Tab by mouth two (2) times a day. 60 Tab 1   ??? insulin detemir (LEVEMIR) 100 unit/mL (3 mL) pen 37 units at night 1 Package 0   ??? insulin aspart (NOVOLOG FLEXPEN) 100 unit/mL flexpen 0-150 = 0 units  151-200 = 2 units  201-250 = 4 units  251-300 = 6 units  301-350 = 8 units  351-400 = 10 units  401+ -> Come to the Emergency Department 1 Package 0   ??? guaiFENesin SR (MUCINEX) 600 mg SR tablet Take 1 Tab by mouth two (2) times a day. Indications: COUGH 20 Tab 0   ??? HYDROcodone-acetaminophen (NORCO) 5-325 mg per tablet Take one tab po Q6h/PRN 30 Tab 0   ??? dextroamphetamine-amphetamine (ADDERALL) 20 mg tablet Take 1 Tab by mouth four (4) times daily. Max Daily Amount: 80 mg. 28 Tab 0   ??? ALPRAZolam (XANAX) 1 mg tablet Take 1 Tab by mouth four (4) times daily as needed for Anxiety. Max Daily Amount: 4 mg. Drowsiness precautions. Avoid alcohol. 28 Tab 0   ??? Blood-Glucose Meter (FREESTYLE FREEDOM LITE) monitoring kit Use to test blood sugars four times a day. 1 Kit 0   ??? glucose blood VI test strips (FREESTYLE LITE STRIPS) strip Use to test Blood Sugars four times a day. 150 Each 6   ??? Lancets (FREESTYLE LANCETS) misc Use to test Blood Sugars four times a day. 150 Each 6   ??? ergocalciferol (ERGOCALCIFEROL) 50,000 unit capsule Take 1 Cap by mouth every seven (7) days. Take for 12 weeks. 12 Cap 0   ??? ESOMEPRAZOLE MAGNESIUM (NEXIUM 24HR PO) Take 1 Cap by mouth. daily     ??? FLUoxetine (  PROZAC) 40 mg capsule Take 40 mg by mouth daily.      ??? divalproex ER (DEPAKOTE ER) 500 mg ER tablet Take 500 mg by mouth two (2) times a day.     ??? Insulin Needles, Disposable, (NANO PEN NEEDLE) 32 x 5/32 " ndle 1 Device.     ??? metFORMIN (GLUCOPHAGE) 500 mg tablet Take 1 Tab by mouth two (2) times daily (with meals). For glucose control 60 Tab 0   ??? DULoxetine (CYMBALTA) 60 mg capsule Take 120 mg by mouth two (2) times a day.         Allergies   Allergen Reactions   ??? Topiramate Anxiety   ??? Ace Inhibitors Nausea and Vomiting   ??? Nsaids (Non-Steroidal Anti-Inflammatory Drug) Nausea and Vomiting       Visit Vitals   Item Reading   ??? BP 169/90 mmHg   ??? Pulse 120   ??? Temp(Src) 98.1 ??F (36.7 ??C) (Oral)   ??? Resp 16   ??? Ht 5' 1.42" (1.56 m)   ??? Wt 211 lb (95.709 kg)   ??? BMI 39.33 kg/m2   ??? SpO2 100%         Physical Exam   Constitutional: She is oriented to person, place, and time. She appears well-developed and well-nourished. No distress.   HENT:   Head: Normocephalic and atraumatic.   Right Ear: External ear normal.   Left Ear: External ear normal.   Nose: Nose normal.   Mouth/Throat: Oropharynx is clear and moist. No oropharyngeal exudate.   Eyes: Conjunctivae are normal. Pupils are equal, round, and reactive to light. Right eye exhibits no discharge. Left eye exhibits no discharge.   Neck: Normal range of motion. Neck supple. No tracheal deviation present. No thyromegaly present.   Cardiovascular: Normal rate, regular rhythm, normal heart sounds and intact distal pulses.  Exam reveals no gallop and no friction rub.    No murmur heard.  Pulmonary/Chest: Effort normal and breath sounds normal. No respiratory distress. She has no wheezes. She has no rales.   Abdominal: Soft. She exhibits no distension. There is no tenderness.   Musculoskeletal: Normal range of motion. She exhibits no edema or tenderness.   Lymphadenopathy:     She has no cervical adenopathy.   Neurological: She is alert and oriented to person, place, and time. Coordination normal.    Skin: Skin is warm and dry. No rash noted. She is not diaphoretic. No erythema.   Psychiatric: She has a normal mood and affect. Her behavior is normal. Judgment and thought content normal.       ASSESSMENT and PLAN  1. Pregnancy, high-risk, first trimester  - pt has appt with [facility] on 01/08/15 but she will try to get a sooner appointment  - asked her to contact her Psych MD about her psych meds  - pt has questions about pain meds but I told her to speak to OB about that  - gave info about pregnancy and diabetes  - filled out form for work, copied and will be scanned into chart    2. Type 2 diabetes mellitus without complication (HCC)  - asked pt to increase Levemir from 27 units to 30 units nightly and keep Novolog as is and follow up with Dr Laurance Flatten in 1 week  - asked pt to keep log of BS and bring to next appointment    3. Essential hypertension  - labetalol (NORMODYNE) 100 mg tablet; Take 1 Tab by mouth two (  2) times a day.  Dispense: 60 Tab; Refill: 1  - follow up in 1 week with Dr. Laurance Flatten      Plan and reference materials were reviewed with the patient and the patient expressed understanding.  Patient instructed that if symptoms/condition worsens or fails to resolve to come back to the office or go to the emergency room.    Discussed case/plan with Dr. Ashley Akin, my supervising physician, and he is in agreement.    Vena Rua, [place]

## 2014-12-11 NOTE — Progress Notes (Signed)
Alejandra Fletcher is a 38 y.o. female presents in office needing doctors note due to being newly pregnant.             1. Have you been to the ER, urgent care clinic since your last visit?  Hospitalized since your last visit?No    2. Have you seen or consulted any other health care providers outside of the [facility] since your last visit?  Include any pap smears or colon screening. No

## 2014-12-11 NOTE — Patient Instructions (Signed)
Continue taking insulin. Try increasing Levemir to 30 units nightly and continue with Novolog 3 times a day. Follow up with Dr. Christell ConstantMoore in one week.  Take Labetalol 100 mg twice daily. Follow up in one week with Dr. Christell ConstantMoore.  Take only Tylenol at this point for pain. Discuss with OB other pain med options.  Call Psych MD and discuss options for during pregnancy.  Please call with any questions or concerns.  If symptoms persist or worsen, please come back or go to the Emergency Room.        High-Risk Pregnancy: Care Instructions  Your Care Instructions  Your pregnancy is high-risk if you or your baby has an increased risk for health problems. Many things can put you at high risk. But being at high risk does not mean that you or your baby will have a problem.  Your pregnancy is high-risk if:  ?? You have a previous condition, such as diabetes.  ?? Your baby has been found to have a problem, such as Down syndrome.  ?? You have a problem during pregnancy, such as preeclampsia, or problems with the placenta???the organ that gives food and oxygen to the baby.  ?? You had a problem in a past pregnancy.  ?? You are younger than 6317 or older than 235.  ?? You are pregnant with twins or more.  Your doctor will watch you closely to make sure that you and your baby are doing well. You may have ultrasounds or other tests to check your baby's growth. In some cases, you may have to rest at home (or in the hospital) until your baby is old enough to be born safely. If your doctor thinks that your health or your baby's health is at risk, you may have an early delivery.  Follow-up care is a key part of your treatment and safety. Be sure to make and go to all appointments, and call your doctor if you are having problems. It???s also a good idea to know your test results and keep a list of the medicines you take.  How can you care for yourself at home?  ?? Go to all your prenatal visits. You will have tests for high blood  pressure and for protein in your urine (both are signs of preeclampsia). Your doctor also will make sure that your baby is growing properly.  ?? Follow your doctor's directions for activity. You may have to stop working, reduce your activity, or spend a lot of time resting (partial bed rest). You may need to do daily checks of your baby's heartbeat and your weight. If you are on bed rest:  ?? Keep a phone, phone book, notepad, and pen near the bed where you can easily reach them.  ?? Gently stretch your legs every hour to keep good blood circulation.  ?? Do quiet activities such as reading, craft projects, or writing letters.  ?? If you are told to take medicine, such as medicine for high blood pressure, take your medicine exactly as prescribed. Call your doctor if you have any problems with your medicine.  ?? Follow your doctor's advice for diet and other tips for a healthy pregnancy. Rest when you need it, eat well, and drink plenty of water. If you are not on partial bed rest, do mild exercise (such as walking) if your doctor says it is okay.  ?? Do not smoke. Smoking can harm your baby's growth and health. If you need help quitting, talk to your doctor  about stop-smoking programs and medicines. These can increase your chances of quitting for good.  ?? Do not drink alcohol. Alcohol can cause problems in the growing baby.  ?? Avoid tobacco smoke, alcohol and drugs, chemicals, and radiation (such as from X-rays). Stay away from people who have colds and other infections.   Where can you learn more?   Go to [email or web address]  Enter X204 in the search box to learn more about "High-Risk Pregnancy: Care Instructions."   ?? 2006-2015 Healthwise, Incorporated. Care instructions adapted under license by [employer] (which disclaims liability or warranty for this information). This care instruction is for use with your licensed healthcare professional. If you have questions about a medical condition  or this instruction, always ask your healthcare professional. Healthwise, Incorporated disclaims any warranty or liability for your use of this information.  Content Version: 10.7.482551; Current as of: May 03, 2014              Learning About Future Pregnancy and Diabetes  How can you plan for pregnancy when you have diabetes?     Women who have diabetes are more at risk for having miscarriages. And they are more likely to have babies with birth defects. This is mainly true if blood sugar is not kept in the target range during the early part of pregnancy.  You can take steps before you are pregnant to help manage your diabetes. This will help make sure that both you and your baby stay healthy.  Things to do  ?? Check your blood sugar often. This is so you will know if your blood sugar is under control.  ?? Get your blood sugar in your target range. The target range for women who are not pregnant but have diabetes is an A1c level less than 7.0%.  ?? Start taking a folic acid supplement. Ask your doctor or midwife about the amount that is right for you.  ?? See a doctor or certified nurse-midwife for an exam. Discuss the medicines and supplements you take. Talk about your diabetes history or other concerns you have.  ?? Keep track of your menstrual cycle. This helps you know the best time to try to get pregnant.  ?? Take only the medicines your doctor or midwife says are okay.  ?? Eat a healthy diet.  ?? Exercise regularly. That will help you handle the demands of pregnancy, childbirth, and recovery.  Things to avoid  ?? Avoid nonsteroidal anti-inflammatory drugs (NSAIDs), such as ibuprofen or aspirin, unless your doctor tells you to take them.  ?? Avoid caffeine, alcohol, and illegal drugs.  ?? Do not smoke. Smoking can harm your baby. And it increases the chances that you will have problems from diabetes. If you need help quitting, talk to your doctor about stop-smoking programs and medicines. These can  increase your chances of quitting for good.  What else should you think about?  Before trying to get pregnant:  ?? Have your doctor check for problems from diabetes, such as eye or kidney disease. When you are pregnant, these problems can get worse.  ?? Get any vaccines you might need. This will help prevent infections such as rubella or measles that can cause birth defects or miscarriage.  ?? Talk with your doctor about whether to have screening tests for diseases that are passed down through your family (genetic disorders).  ?? See your dentist. Take care of any dental work you may need.  What if you think you might be pregnant?  ??  You can use a home pregnancy test as soon as the first day of your first missed menstrual period.  ?? As soon as you know you're pregnant, check with your doctor. At your first prenatal visit you will get information on how to care for yourself and your growing baby.   Where can you learn more?   Go to [email or web address]://www.[email or web address].net/[email or web address]  Enter B946 in the search box to learn more about "Learning About Future Pregnancy and Diabetes."   ?? 2006-2015 Healthwise, Incorporated. Care instructions adapted under license by [employer] (which disclaims liability or warranty for this information). This care instruction is for use with your licensed healthcare professional. If you have questions about a medical condition or this instruction, always ask your healthcare professional. Healthwise, Incorporated disclaims any warranty or liability for your use of this information.  Content Version: 10.7.482551; Current as of: May 03, 2014              Weeks 6 to 10 of Your Pregnancy: Care Instructions  Your Care Instructions     Congratulations on your pregnancy. This is an exciting and important time for you.  During the first 6 to 10 weeks of your pregnancy, your body goes through many changes. Your baby grows very fast, even though you cannot feel it  yet. You may start to notice that you feel different, both in your body and your emotions. Because each woman's pregnancy is unique, there is no right way to feel. You may feel the healthiest you have ever been, or you may feel tired or sick to your stomach ("morning sickness").  These early weeks are a time to make healthy choices and to eat the best foods for you and your baby. This care sheet will give you some ideas.  This is also a good time to think about birth defects testing. These are tests done during pregnancy to look for possible problems with the baby. First trimester tests for birth defects can be done between 10 and 13 weeks of pregnancy, depending on the test. Talk with your doctor about what kinds of tests are available.  Follow-up care is a key part of your treatment and safety. Be sure to make and go to all appointments, and call your doctor if you are having problems. It's also a good idea to know your test results and keep a list of the medicines you take.  How can you care for yourself at home?  Eat well  ?? Eat at least 3 meals and 2 healthy snacks every day. Eat fresh, whole foods, including:  ?? 7 or more servings of bread, tortillas, cereal, rice, pasta, or oatmeal.  ?? 3 or more servings of vegetables, especially leafy green vegetables.  ?? 2 or more servings of fruits.  ?? 3 or more servings of milk, yogurt, or cheese.  ?? 2 or more servings of meat, [place], chicken, fish, eggs, or dried beans.  ?? Drink plenty of fluids, especially water. Avoid sodas and other sweetened drinks.  ?? Choose foods that have important vitamins for your baby, such as calcium, iron, and folate.  ?? Dairy products, tofu, canned fish with bones, almonds, broccoli, dark leafy greens, corn tortillas, and fortified orange juice are good sources of calcium.  ?? Beef, poultry, liver, spinach, lentils, dried beans, fortified cereals, and dried fruits are rich in iron.   ?? Dark leafy greens, broccoli, asparagus, liver, fortified cereals, orange juice, peanuts, and almonds are good sources of folate.  ?? Avoid  foods that could harm your baby.  ?? Do not eat raw or undercooked meat, chicken, or fish (such as sushi or raw oysters).  ?? Do not eat raw eggs or foods that contain raw eggs, such as Caesar dressing.  ?? Do not eat soft cheeses and unpasteurized dairy foods, such as Brie, feta, or blue cheese.  ?? Do not eat fish that contains a lot of mercury, such as shark, swordfish, tilefish, or king mackerel. Do not eat more than 6 ounces of tuna each week.  ?? Do not eat raw sprouts, especially alfalfa sprouts.  ?? Cut down on caffeine, such as coffee, tea, and cola.  Protect yourself and your baby  ?? Do not touch kitty litter or cat feces. They can cause an infection that could harm your baby.  ?? High body temperature can be harmful to your baby. So if you want to use a sauna or hot tub, be sure to talk to your doctor about how to use it safely.  Cope with morning sickness  ?? Sip small amounts of water, juices, or shakes. Try drinking between meals, not with meals.  ?? Eat 5 or 6 small meals a day. Try dry toast or crackers when you first get up, and eat breakfast a little later.  ?? Avoid spicy, greasy, and fatty foods.  ?? When you feel sick, open your windows or go for a short walk to get fresh air.  ?? Try nausea wristbands. These help some women.  ?? Tell your doctor if you think your prenatal vitamins make you sick.   Where can you learn more?   Go to [employer]  Enter G112 in the search box to learn more about "Weeks 6 to 10 of Your Pregnancy: Care Instructions."   ?? 2006-2015 Healthwise, Incorporated. Care instructions adapted under license by [employer] (which disclaims liability or warranty for this information). This care instruction is for use with your licensed healthcare professional. If you have questions about a medical condition  or this instruction, always ask your healthcare professional. Healthwise, Incorporated disclaims any warranty or liability for your use of this information.  Content Version: 10.7.482551; Current as of: July 11, 2014

## 2014-12-11 NOTE — Telephone Encounter (Signed)
Pt calling to request a more detailed work limit be described to her employer. It needs to include whether or not she can bend, stoop, squat, or pick up and infant under 25 pounds. She added that she is properly trained on how to pick up an infant under 25 pounds. Pt was seen today to complete form brought in from employer. This form did not include these specifics. Copy of form was sent to central scanning to be entered into pts chart. Please advise.

## 2014-12-12 NOTE — Telephone Encounter (Signed)
RE: below. Please advise.

## 2014-12-12 NOTE — Telephone Encounter (Signed)
I will not be in the office until possibly next Wednesday, can you make sure that someone else takes care of this. Thanks!

## 2014-12-18 ENCOUNTER — Encounter: Attending: Family Medicine | Primary: Family Medicine

## 2014-12-18 NOTE — Telephone Encounter (Signed)
Pt returning call to inform us that she was currently in the ER to have a DNC due to an RPOC and did not wish to reschedule her missed appointment. Please be aware.

## 2014-12-18 NOTE — Telephone Encounter (Signed)
As I have not evaluated Alejandra Fletcher for this issue, I would not be able to provide this at this time and she would need f/u. She should reschedule her missed appt she was to have today. It is noted she was in the ER today.

## 2014-12-18 NOTE — Telephone Encounter (Signed)
Dr. Christell ConstantMoore, please be aware, closing encounter.

## 2014-12-18 NOTE — Telephone Encounter (Signed)
Dr Christell ConstantMoore,    Please advise if you will be able to provide the information being requested below.    Thanks

## 2014-12-19 NOTE — Telephone Encounter (Signed)
Jasmine, please advise on below, thank you.

## 2015-02-07 ENCOUNTER — Encounter: Attending: Family Medicine | Primary: Family Medicine

## 2015-02-10 ENCOUNTER — Ambulatory Visit
Admit: 2015-02-10 | Discharge: 2015-02-10 | Payer: PRIVATE HEALTH INSURANCE | Attending: Family Medicine | Primary: Family Medicine

## 2015-02-10 DIAGNOSIS — M549 Dorsalgia, unspecified: Secondary | ICD-10-CM

## 2015-02-10 MED ORDER — METHOCARBAMOL 750 MG TAB
750 mg | ORAL_TABLET | Freq: Every evening | ORAL | Status: DC | PRN
Start: 2015-02-10 — End: 2015-09-30

## 2015-02-10 NOTE — Progress Notes (Signed)
HPI  Alejandra Fletcher is a 39 y.o. female that presents with  Chief Complaint   Patient presents with   ??? Follow-up     FMLA paperwork   ??? Back Pain     fell down stairs one week ago   ??? Knee Pain     Pt reports that on Monday, Feb 22, while coming down stairs at home she tripped and tumbled down stairs, with subsequent mid/low back/left knee pain. She drove to the ER and had xrays done of back and knee and given vicodin pain med, which she has taken mainly at night. 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 Because she was absent for 3 days, her job is requiring completion of fmla paperwork. Pt reports that she is not back to baseline, but that she is well enough to work and she no longer has knee pain. She has been using salon pas, and therma care patches to low back. She is voiding normally. No pain radiating down leg, no leg weakness.  No urinary symptoms.    On inquiring into her chronic medical conditions, (which she had not f/u with me since July)  she reports that she continues to see her psychiatrist in [place] for medications because  Psychiatrists she was referred to in [facility], told her her case was too complex. She is taking labetolol for her bp, and taking insulin for her diabetes, which she reports is doing good. She reports that she has not taken her bp med yet today.        Review of Systems:  ROS:  History obtained from the patient  ?? General: negative for - chills, fever  ?? HEENT: no sore throat,  ?? Respiratory: no cough, shortness of breath, or wheezing  ?? Cardiovascular: no chest pain, palpitations, or dyspnea on exertion  ?? Gastrointestinal: no abdominal pain  ?? Genitourinary:  No dysuria or hematuria  ?? Musculoskeletal: see hpi.   ?? Neurological: no numbness, tingling, headache or dizziness, or seizures  ?? Psychological: no dangerous thoughts reported.       Past Medical History   Diagnosis Date   ??? Gastric ulcer    ??? Gastrointestinal disorder      infections   ??? Diabetes ([place])    ??? Fibromyalgia    ??? 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 GERD (gastroesophageal reflux disease)    ??? Bipolar affective disorder ([place])        Family History   Problem Relation Age of Onset   ??? Fainting Mother    ??? Hypertension Mother    ??? Asthma Mother    ??? Obesity Mother    ??? Diabetes Father    ??? Hypertension Father    ??? Gout Father    ??? Alcohol abuse Paternal Grandfather    ??? Other Paternal Grandfather      Nicotine abuse/ Bronchitis   ??? Heart Disease Paternal Grandmother    ??? Heart Surgery Paternal Grandmother    ??? Alzheimer Paternal Grandmother    ??? No Known Problems Sister    ??? No Known Problems Maternal Grandmother    ??? No Known Problems Maternal Grandfather        Past Surgical History   Procedure Laterality Date   ??? Hx cesarean section     ???  Hx gastric bypass     ??? Hx knee arthroscopy     ??? Hx cholecystectomy         History     Social History   ??? Marital Status: MARRIED     Spouse Name: N/A   ??? Number of Children: N/A   ??? Years of Education: N/A     Occupational History   ??? Not on file.     Social History Main Topics   ??? Smoking status: Current Some Day Smoker -- 0.50 packs/day   ??? Smokeless tobacco: Never Used   ??? Alcohol Use: 0.0 oz/week     0 Standard drinks or equivalent per week      Comment: occasionally   ??? Drug Use: No   ??? Sexual Activity:     Partners: Male     Patent examiner Protection: None     Other Topics Concern   ??? Military Service No   ??? Blood Transfusions Yes     2004, May 10...after delivery of child   ??? Caffeine Concern No   ??? Occupational Exposure No   ??? Hobby Hazards No   ??? Sleep Concern Yes     Insomnia   ??? Stress Concern Yes     life stressors   ??? Weight Concern Yes     over weight   ??? Special Diet Yes     lifestyle, food changes   ??? Back Care Yes     PT for back problems   ??? Exercise No   ??? Bike Helmet No   ??? Seat Belt Yes    ??? Self-Exams Yes     Social History Narrative       Outpatient Prescriptions Marked as Taking for the 02/10/15 encounter (Office Visit) with Cheri Rous, MD   Medication Sig Dispense Refill   ??? labetalol (NORMODYNE) 100 mg tablet Take 1 Tab by mouth two (2) times a day. 60 Tab 1   ??? Blood-Glucose Meter (FREESTYLE FREEDOM LITE) monitoring kit Use to test blood sugars four times a day. 1 Kit 0   ??? glucose blood VI test strips (FREESTYLE LITE STRIPS) strip Use to test Blood Sugars four times a day. 150 Each 6   ??? Lancets (FREESTYLE LANCETS) misc Use to test Blood Sugars four times a day. 150 Each 6   ??? FLUoxetine (PROZAC) 40 mg capsule Take 40 mg by mouth daily.     ??? Insulin Needles, Disposable, (NANO PEN NEEDLE) 32 x 5/32 " ndle 1 Device.     ??? insulin detemir (LEVEMIR) 100 unit/mL (3 mL) pen 37 units at night 1 Package 0   ??? insulin aspart (NOVOLOG FLEXPEN) 100 unit/mL flexpen 0-150 = 0 units  151-200 = 2 units  201-250 = 4 units  251-300 = 6 units  301-350 = 8 units  351-400 = 10 units  401+ -> Come to the Emergency Department 1 Package 0       Allergies   Allergen Reactions   ??? Topiramate Anxiety   ??? Ace Inhibitors Nausea and Vomiting   ??? Lamotrigine Other (comments)   ??? Nsaids (Non-Steroidal Anti-Inflammatory Drug) Nausea and Vomiting       Filed Vitals:    02/10/15 0755 02/10/15 0822   BP: 178/105 138/92   Pulse: 78    Temp: 95.5 ??F (35.3 ??C)    TempSrc: Oral    Resp: 12    Height: 5' 1.42" (1.56 m)    Weight: 210 lb 3.2  oz (95.346 kg)    SpO2: 98%          Physical Examination: General appearance - alert, well appearing, and in no acute distress    Mental status - alert, oriented to person, place, and time  Eyes - no redness, drainage, or jaundice  Nose - normal and patent  Mouth - mucous membranes moist, pharynx normal without lesions; no quinsy, stridor, swelling, or drooling   Neck - supple, nontender  PULM: clear to auscultation, no wheezes, rales or rhonchi, symmetric air entry   CVS - normal rate, regular rhythm, normal S1, S2, no murmurs, rubs, clicks or gallops  GI - soft, nontender, nondistended, no masses or organomegaly   Back exam -mild low back tenderness. Negative SLR, normal strength and sensation.   Neurological - alert, oriented, normal speech, no focal findings or movement disorder noted   MS- b/l knee crepitation, left more than right. No redness or swelling or tenderness.  Extremities - no edema  Skin - normal coloration     LABS/IMAGING:    LUMBAR SPINE COMPLETE2/22/2016   [facility]   Result Impression   Impression:    No fracture.  Early degenerative changes.     Result Narrative   Lumbar spine complete    Comparison: November 07, 2013    Clinical information: Fall with pain.    Findings:    Frontal, lateral, bilateral oblique, and coned sacral lumbosacral spine radiographs obtained.  Surgical clips and suture are present in the upper abdomen.  Sacroiliac joints appear intact.    Vertebral body heights and alignment are normal.  Mild disc narrowing at L4-5 and L5-S1.  No fracture.           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  No fracture.     Result Narrative   Complete left knee    Comparison: None    Clinical information: Fall, anterior knee pain.    Findings:    4 views of the left knee obtained.  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  No fracture.  No significant joint effusion.  No retained radiopaque foreign body.       Assessment/ Plan    Gabi was seen today for follow-up, back pain and knee pain.    Diagnoses and all orders for this visit:    Back pain, unspecified back pain laterality, unspecified location: FMLA paperwork completed and to be scanned to chart. Symptoms improving. She will f/u one week.  Orders:  -     methocarbamol (ROBAXIN) 750 mg tablet; Take 1 Tab by mouth nightly  as needed. Drowsiness precautions. Do not drive after taking.  Indications: MUSCLE SPASM    Type 2 diabetes mellitus without complication (HCC)  Orders:  -     REFERRAL TO ENDOCRINOLOGY    Essential hypertension: she will take her bp med when get home.     Knee pain, unspecified chronicity, unspecified laterality: currently resolved.         Follow-up Disposition:  Return in about 1 week (around 02/17/2015), or if symptoms worsen or fail to improve, for f/u back pain.  I have reviewed return/Er/medication precautions and  discussed the diagnosis with the patient and the intended plan as seen in the above orders.  The patient has been offered and/or received an after-visit summary and questions were answered concerning future plans. Patient verbalized understanding  of all instruction.      Jeannine Boga, MD

## 2015-02-10 NOTE — Progress Notes (Signed)
Madelyn Orvan JulyRawlins Varkey is a 39 y.o. female [facility].     1. Have you been to the ER, urgent care clinic or hospitalized since your last visit? Yes, [facility]  2. [facility] other [facility] since your last visit? No

## 2015-02-10 NOTE — Patient Instructions (Signed)
Back Pain: Care Instructions  Your Care Instructions     Back pain has many possible causes. It is often related to problems with muscles and ligaments of the back. It may also be related to problems with the nerves, discs, or bones of the back. Moving, lifting, standing, sitting, or sleeping in an awkward way can strain the back. Sometimes you don't notice the injury until later. Arthritis is another common cause of back pain.  Although it may hurt a lot, back pain usually improves on its own within several weeks. Most people recover in 12 weeks or less. Using good home treatment and being careful not to stress your back can help you feel better sooner.  Follow-up care is a key part of your treatment and safety. Be sure to make and go to all appointments, and call your doctor if you are having problems. It???s also a good idea to know your test results and keep a list of the medicines you take.  How can you care for yourself at home?  ?? Sit or lie in positions that are most comfortable and reduce your pain. Try one of these positions when you lie down:  ?? Lie on your back with your knees bent and supported by large pillows.  ?? Lie on the floor with your legs on the seat of a sofa or chair.  ?? Lie on your side with your knees and hips bent and a pillow between your legs.  ?? Lie on your stomach if it does not make pain worse.  ?? Do not sit up in bed, and avoid soft couches and twisted positions. Bed rest can help relieve pain at first, but it delays healing. Avoid bed rest after the first day of back pain.  ?? Change positions every 30 minutes. If you must sit for long periods of time, take breaks from sitting. Get up and walk around, or lie in a comfortable position.  ?? Try using a heating pad on a low or medium setting for 15 to 20 minutes every 2 or 3 hours. Try a warm shower in place of one session with the heating pad.  ?? You can also try an ice pack for 10 to 15 minutes every 2 to 3 hours.  Put a thin cloth between the ice pack and your skin.  ?? Take pain medicines exactly as directed.  ?? If the doctor gave you a prescription medicine for pain, take it as prescribed.  ?? If you are not taking a prescription pain medicine, ask your doctor if you can take an over-the-counter medicine.  ?? Take short walks several times a day. You can start with 5 to 10 minutes, 3 or 4 times a day, and work up to longer walks. Walk on level surfaces and avoid hills and stairs until your back is better.  ?? Return to work and other activities as soon as you can. Continued rest without activity is usually not good for your back.  ?? To prevent future back pain, do exercises to stretch and strengthen your back and stomach. Learn how to use good posture, safe lifting techniques, and proper body mechanics.  When should you call for help?  Call your doctor now or seek immediate medical care if:  ?? You have new or worsening numbness in your legs.  ?? You have new or worsening weakness in your legs. (This could make it hard to stand up.)  ?? You lose control of your bladder or bowels.    Watch closely for changes in your health, and be sure to contact your doctor if:  ?? Your pain gets worse.  ?? You are not getting better after 2 weeks.   Where can you learn more?   Go to http://www.healthwise.net/[facility]  Enter I594 in the search box to learn more about "Back Pain: Care Instructions."   ?? 2006-2015 Healthwise, Incorporated. Care instructions adapted under license by [place] (which disclaims liability or warranty for this information). This care instruction is for use with your licensed healthcare professional. If you have questions about a medical condition or this instruction, always ask your healthcare professional. Healthwise, Incorporated disclaims any warranty or liability for your use of this information.  Content Version: 10.7.482551; Current as of: May 03, 2014

## 2015-02-18 ENCOUNTER — Encounter: Attending: Family Medicine | Primary: Family Medicine

## 2015-02-21 ENCOUNTER — Encounter: Attending: Family Medicine | Primary: Family Medicine

## 2015-04-01 NOTE — Consult Note (Signed)
History of Present Illness:   Reason for Consult Iron deficiency anemia.    HPI   Patient is a 39 year old female with multiple medical problems who was admitted with syncope in acute renal failure.  Patient had been having symptoms for several days prior to her admission.  She was also noted to be significantly anemic with decreased iron stores.  Currently, patient feels improved and nearly back to her baseline.  She denies any bleeding or bruising.  She denies any weakness or fatigue.  She has a good appetite and denies weight loss.  She has no further neurologic complaints.  She denies any recent fevers.  She has no nausea, vomiting constipation, or diarrhea.  She has noted no melena or hematochezia. Patient offers no further specific complaints today.  PFSH:   Additional Past Medical and Surgical History Past medical history: Depression, fibromyalgia, hypertension, bipolar, diabetes, anemia.  Past surgical history: Gastric bypass, cholecystectomy, left knee surgery, C-section.  Social history: Patient denies tobacco or alcohol.  Family history: Diabetes, hypertension, seizures.   Review of Systems:   Performance Status (ECOG) 0    Review of Systems   As per HPI. Otherwise, 10 point system review was negative.   NURSING NOTES: **Vital Signs.:   29-Jul-13 13:49    Vital Signs Type: Routine    Temperature Temperature (F): 98.1    Celsius: 36.7    Temperature Source: oral    Pulse Pulse: 80    Respirations Respirations: 20    Systolic BP Systolic BP: 354    Diastolic BP (mmHg) Diastolic BP (mmHg): 74    Mean BP: 85    Pulse Ox % Pulse Ox %: 97    Pulse Ox Activity Level: At rest    Oxygen Delivery: Room Air/ 21 %   Physical Exam:   Physical Exam General: Well-developed, well-nourished, no acute distress. Eyes: Pink conjunctiva, anicteric sclera. HEENT: Normocephalic, moist mucous membranes, clear oropharnyx. Lungs: Clear to auscultation bilaterally. Heart:  Regular rate and rhythm. No rubs, murmurs, or gallops. Abdomen: Soft, nontender, nondistended. No organomegaly noted, normoactive bowel sounds. Musculoskeletal: No edema, cyanosis, or clubbing. Neuro: Alert, answering all questions appropriately. Cranial nerves grossly intact. Skin: No rashes or petechiae noted. Psych: Normal affect. Lymphatics: No cervical, calvicular, axillary or inguinal LAD.    Ace Inhibitors: Cough    Cymbalta 60 mg oral delayed release capsule: 1 cap(s) orally 2 times a day for depression and fibromyalgia pain., Active, 0, None   Lyrica 100 mg oral capsule: 1 cap(s) orally 2 times a day for fibromyalgia., Active, 0, None   Benicar HCT 40 mg-12.5 mg oral tablet: 1 tab(s) orally once a day (in the morning) for blood pressure., Active, 0, None   Xanax 1 mg oral tablet: 1 tab(s) orally once a week as needed for anxiety and panic., Active, 0, None   oxycodone 15 mg oral tablet: 1 tab(s) orally 2 times a day as needed for pain. , Active, 0, None   Lamictal 200 mg oral tablet: 1 tab(s) orally 2 times a day for depression., Active, 0, None   Adderall 20 mg oral tablet: 2 tab(s) orally 2 times a day (morning and 12-1pm)., Active, 0, None   Risperdal 2 mg oral tablet: 1 tab(s) orally once a day (in the evening) for bipolar., Active, 0, None  Laboratory Results: Hepatic:  29-Jul-13 04:21    Bilirubin, Total 0.3   Alkaline Phosphatase 102   SGPT (ALT)  11 (12-78 NOTE: NEW REFERENCE RANGE 11/05/2011)  SGOT (AST) 15   Total Protein, Serum  6.0   Albumin, Serum  2.6  Routine Chem:  29-Jul-13 04:21    Glucose, Serum  224   BUN  27   Creatinine (comp)  1.84   Sodium, Serum 142   Potassium, Serum 3.8   Chloride, Serum  110   CO2, Serum 23   Calcium (Total), Serum  7.6   Osmolality (calc) 295   eGFR (African American)  41   eGFR (Non-African American)  35 (eGFR values <68m/min/1.73 m2 may be an indication of chronic kidney disease (CKD). Calculated eGFR is  useful in patients with stable renal function. The eGFR calculation will not be reliable in acutely ill patients when serum creatinine is changing rapidly. It is not useful in  patients on dialysis. The eGFR calculation may not be applicable to patients at the low and high extremes of body sizes, pregnant women, and vegetarians.)   Anion Gap 9  Routine Coag:  29-Jul-13 04:21    Prothrombin 12.5   INR 0.9 (INR reference interval applies to patients on anticoagulant therapy. A single INR therapeutic range for coumarins is not optimal for all indications; however, the suggested range for most indications is 2.0 - 3.0. Exceptions to the INR Reference Range may include: Prosthetic heart valves, acute myocardial infarction, prevention of myocardial infarction, and combinations of aspirin and anticoagulant. The need for a higher or lower target INR must be assessed individually. Reference: The Pharmacology and Management of the Vitamin K  antagonists: the seventh ACCP Conference on Antithrombotic and Thrombolytic Therapy. CYBWLS.9373Sept:126 (3suppl): [id number] A HCT value >55% may artifactually increase the PT.  In one study,  the increase was an average of 25%. Reference:  "Effect on Routine and Special Coagulation Testing Values of Citrate Anticoagulant Adjustment in Patients with High HCT Values." American Journal of Clinical Pathology 2006;126:400-405.)  Routine Hem:  29-Jul-13 04:21    WBC (CBC) 9.7   RBC (CBC)  3.45   Hemoglobin (CBC)  8.9   Hematocrit (CBC)  27.7   Platelet Count (CBC) 198   MCV 80   MCH  25.7   MCHC 32.1   RDW  18.3   Neutrophil % 48.6   Lymphocyte % 42.3   Monocyte % 6.5   Eosinophil % 2.3   Basophil % 0.3   Neutrophil # 4.7   Lymphocyte #  4.1   Monocyte # 0.6   Eosinophil # 0.2   Basophil # 0.0 (Result(s) reported on 10 Jul 2012 at 06:27AM.)   Assessment and Plan:  Impression:   Anemia.  Plan:   1.  Iron deficiency anemia: Patient's  hemoglobin and iron stores are significantly decreased, most likely secondary to poor absorption after her gastric bypass surgery.  Her creatinine is also elevated and she may benefit from Procrit in the future as well.  B12 and folate levels are currently pending and will also order labs assess for underlying hemolysis.  Patient will receive 510 mg IV Feraheme today.  [place] 2[place] infusion. consult, call with questions.  Electronic Signatures: FDelight Hoh(MD)  (Signed 29-Jul-13 14:50)  Authored: HISTORY OF PRESENT ILLNESS, PFSH, ROS, NURSING NOTES, PE, ALLERGIES, HOME MEDICATIONS, LABS, ASSESSMENT AND PLAN   Last Updated: 29-Jul-13 14:50 by FDelight Hoh(MD)

## 2015-04-01 NOTE — Consult Note (Signed)
Chief Complaint:   Subjective/Chief Complaint no n/v or abdominal pain. one bm, not bloody or melena   VITAL SIGNS/ANCILLARY NOTES: **Vital Signs.:   30-Jul-13 09:51   Vital Signs Type Q 4hr   Temperature Temperature (F) 98.3   Celsius 36.8   Temperature Source Oral   Pulse Pulse 78   Respirations Respirations 20   Systolic BP Systolic BP 315   Diastolic BP (mmHg) Diastolic BP (mmHg) 87   Mean BP 104   Pulse Ox % Pulse Ox % 97   Pulse Ox Activity Level  At rest   Oxygen Delivery Room Air/ 21 %   Brief Assessment:   Cardiac Regular    Respiratory clear BS    Gastrointestinal details normal Soft  Nontender  Nondistended  No masses palpable  Bowel sounds normal   Lab Results: Routine Chem:  30-Jul-13 06:32    Glucose, Serum  111   BUN 11   Creatinine (comp) 0.93   Sodium, Serum 144   Potassium, Serum 4.0   Chloride, Serum  111   CO2, Serum 24   Calcium (Total), Serum  7.8   Anion Gap 9   Osmolality (calc) 287   eGFR (African American) >60   eGFR (Non-African American) >60 (eGFR values <53m/min/1.73 m2 may be an indication of chronic kidney disease (CKD). Calculated eGFR is useful in patients with stable renal function. The eGFR calculation will not be reliable in acutely ill patients when serum creatinine is changing rapidly. It is not useful in  patients on dialysis. The eGFR calculation may not be applicable to patients at the low and high extremes of body sizes, pregnant women, and vegetarians.)  Routine Coag:  29-Jul-13 04:21    INR 0.9 (INR reference interval applies to patients on anticoagulant therapy. A single INR therapeutic range for coumarins is not optimal for all indications; however, the suggested range for most indications is 2.0 - 3.0. Exceptions to the INR Reference Range may include: Prosthetic heart valves, acute myocardial infarction, prevention of myocardial infarction, and combinations of aspirin and anticoagulant. The need for a higher  or lower target INR must be assessed individually. Reference: The Pharmacology and Management of the Vitamin K  antagonists: the seventh ACCP Conference on Antithrombotic and Thrombolytic Therapy. CVVOHY.0737Sept:126 (3suppl): [id number] A HCT value >55% may artifactually increase the PT.  In one study,  the increase was an average of 25%. Reference:  "Effect on Routine and Special Coagulation Testing Values of Citrate Anticoagulant Adjustment in Patients with High HCT Values." American Journal of Clinical Pathology 2006;126:400-405.)  Routine Hem:  30-Jul-13 06:32    WBC (CBC) 6.9   RBC (CBC)  3.42   Hemoglobin (CBC)  8.8   Hematocrit (CBC)  27.4   Platelet Count (CBC) 184   MCV 80   MCH  25.9   MCHC 32.3   RDW  18.6   Neutrophil % 47.7   Lymphocyte % 41.9   Monocyte % 6.7   Eosinophil % 3.3   Basophil % 0.4   Neutrophil # 3.3   Lymphocyte # 2.9   Monocyte # 0.5   Eosinophil # 0.2   Basophil # 0.0 (Result(s) reported on 11 Jul 2012 at 07:11AM.)   Assessment/Plan:  Assessment/Plan:   Assessment 1) anmein, fe deficiency in the settign of goody powder use and reported black stools.  2) history of gastric bypass    Plan 1)EGD today, further recs to follow.   Electronic Signatures: SLoistine Simas(MD)  (Signed [phone]  12:62)  Authored: Chief Complaint, VITAL SIGNS/ANCILLARY NOTES, Brief Assessment, Lab Results, Assessment/Plan   Last Updated: 30-Jul-13 12:54 by Loistine Simas (MD)

## 2015-04-01 NOTE — H&P (Signed)
PATIENT NAME:  Virginia [name], Virginia [name] MR#:  [id number] DATE OF BIRTH:  10-Oct-1976  DATE OF ADMISSION:  07/09/2012  REFERRING PHYSICIAN: Governor Rooks, M.D.  PRIMARY CARE PHYSICIAN: None local.   REASON FOR ADMISSION: Recurrent syncope now with near syncope and profound anemia.   HISTORY OF PRESENT ILLNESS: The patient is a 39 year old female with a history of depression, fibromyalgia, diabetes, and bipolar disorder who presents to the Emergency Room with near syncope today. She states that she has had three syncopal episodes prior to today but did not seek medical attention. Today, she was at a family gathering where she became dizzy and lightheaded with blurred vision. She almost passed out but did not. She was brought to the Emergency Room where she was found to be profoundly anemic with multiple electrolyte abnormalities including hypokalemia, acute renal failure, and hypocalcemia. She is now admitted for further evaluation.   PAST MEDICAL HISTORY:  1. Depression.  2. Fibromyalgia.  3. Benign hypertension.  4. Bipolar disorder.  5. Type 2 diabetes mellitus, on insulin.  6. History of gastric bypass surgery.  7. Anemia, presumably iron deficient.  8. Status post cholecystectomy.  9. Status post left knee surgery.  10. Status post C-section.   MEDICATIONS:  1. Lamictal 200 mg p.o. b.i.d.  2. Cymbalta 60 mg p.o. b.i.d.  3. Lyrica 100 mg p.o. b.i.d.  4. Benicar HCT 40/12.5, 1 p.o. daily.  5. Adderall 40 mg p.o. b.i.d. 6. Risperdal 20 mg p.o. at bedtime.  7. Xanax 1 mg p.o. q. 6 hours p.r.n. anxiety.  8. Oxycodone 15 mg p.o. q. 6 hours p.r.n. pain.  9. Levemir 27 units subcutaneous at bedtime.  10. NovoLog sliding scale as directed.   ALLERGIES: ACE inhibitors.   SOCIAL HISTORY: The patient denies alcohol or tobacco abuse.   FAMILY HISTORY: Positive for diabetes, hypertension, and seizures.   REVIEW OF SYSTEMS:  CONSTITUTIONAL: No fever or change in weight. EYES: Did have blurred vision  earlier. No glaucoma. ENT: Positive for tenderness. No hearing loss. No nasal discharge or bleeding. No difficulty swallowing. RESPIRATORY: No cough or wheezing. Denies hemoptysis. CARDIOVASCULAR: No chest pain or orthopnea. No palpitations. GI: Some nausea but no vomiting or diarrhea. No abdominal pain. No change in bowel habits. GU: No dysuria or hematuria. No incontinence. ENDOCRINE: No polyuria or polydipsia. No heat or cold intolerance. HEMATOLOGIC: The patient admits to anemia but denies easy bruising or bleeding. LYMPHATIC: No swollen glands. MUSCULOSKELETAL: The patient denies pain in her neck, back, shoulders, knees, or hips. No gout. NEUROLOGIC: No numbness, although she does have weakness. Denies migraines, stroke, or seizures. PSYCH: The patient denies anxiety or insomnia, although she does admit to significant depression.   PHYSICAL EXAMINATION:  GENERAL: The patient is currently in no acute distress.   VITAL SIGNS: Vital signs are remarkable for a blood pressure of 110/64 with a heart rate of 87 and a respiratory rate of 20. She is afebrile.   HEENT: Normocephalic, atraumatic. Pupils equally round and reactive to light and accommodation. Extraocular movements are intact. Sclerae are anicteric. Conjunctivae are clear.  Oropharynx is clear.   NECK: Supple without jugular venous distention. No adenopathy or thyromegaly is noted.   LUNGS: Essentially clear to auscultation and percussion without wheezes, rales, or rhonchi. No dullness.   CARDIAC: Regular rate and rhythm with normal S1, S2. There is a 2/6 systolic ejection murmur noted. No rubs or gallops present.   ABDOMEN: Soft, nontender, with normoactive bowel sounds. No organomegaly or masses were appreciated.  No hernias or bruits were noted. Stool was guaiac-negative per the Emergency Room physician.   EXTREMITIES: Without clubbing, cyanosis, or edema. Pulses were 2+ bilaterally.   SKIN: Warm and dry without rash or lesions.    NEUROLOGIC: Cranial nerves II through XII grossly intact. Deep tendon reflexes were symmetric. Motor and sensory examination is nonfocal.   PSYCH: The patient was alert and oriented to person, place, and time. She was cooperative and used good judgment.   LABORATORY DATA: White count was 8.5 with a hemoglobin of 6.9 and a platelet count of 157 with an MCV of 81. Glucose was 214 with a BUN of 24 and a creatinine of 2.28 with a GFR of 31 with a sodium of 148 and a potassium of 2.5 and a calcium of less than 5. Anion gap was normalized at 12. Troponin was less than 0.02. Urinalysis unremarkable. ABG revealed a pH of 7.34 with a pCO2 of 39 and a pO2 of 84 with saturation of 97% and a lactic acid of 0.5.   ASSESSMENT:  1. Profound anemia of unclear etiology.  2. Metabolic encephalopathy.  3. Acute renal failure.  4. Hypokalemia.  5. Hypernatremia.  6. Hypocalcemia.  7. Type 2 diabetes.  8. Bipolar disorder.  9. Fibromyalgia.  10. Chronic pain,  11. Depression.   PLAN: The patient defers transfusion at this time if at all possible. She would like to line up donors from her family prior to transfusion. We will try to type and screen and crossmatch 2 units. We will follow her hemoglobin closely. We will repeat her hemoglobin and chemistries at this time to ensure their reliability. We will begin IV fluids with potassium supplementation. We will guaiac all stools. We will follow her sugars with Accu-Cheks before meals and at bedtime and add sliding scale insulin as needed. We will consult GI and hematology in regards to her profound anemia. We will obtain routine chest x-ray. We will send off anemia labs. We will also check a Lamictal level. We will monitor her on telemetry. Further treatment and evaluation will depend upon the patient's progress.   TOTAL TIME SPENT ON THIS PATIENT: 50 minutes.   ____________________________ Duane LopeJeffrey D. Judithann SheenSparks, MD jds:bjt D: 07/09/2012 22:39:22 ET T: 07/10/2012  07:33:24 ET JOB#: [id number]  cc: Duane LopeJeffrey D. Judithann SheenSparks, MD, <Dictator> Taquan Bralley Rodena Medin Erek Kowal MD ELECTRONICALLY SIGNED 07/10/2012 17:20

## 2015-04-01 NOTE — Consult Note (Signed)
PATIENT NAME:  Virginia [name], Virginia [name] MR#:  [id number] DATE OF BIRTH:  [birth year]  DATE OF CONSULTATION:  07/10/2012  REFERRING PHYSICIAN:  Dr. Renae Gloss  CONSULTING PHYSICIAN:  Ranae Plumber. Arvilla Market, ANP / Dr. Barnetta Chapel   REASON FOR CONSULTATION: Anemia.   PRIMARY CARE PHYSICIAN: Dr. Dorothyann Peng, [facility], [facility].  HISTORY OF PRESENT ILLNESS: This 39 year old African American female has a history of depression, fibromyalgia, diabetes, bipolar disorder and gastric bypass surgery Roux-en-Y presents with syncope. Patient reports she passed out last week, was seen at [facility] ER. She was told her lab work was okay. Sunday she became lethargic, disoriented, was confused, and the family brought her to the Emergency Room and she was found to have profound anemia with electrolyte imbalance including hypokalemia, acute renal failure and hypocalcemia. She has had marked fatigue for some time. Patient had no specific GI complaints except for constipation. Three weeks ago, however, she passed a large bowel movement and she saw red in it. Since then she has seen no blood or melena and the stools have been variable in color from dark brown to black to light brown. She has no abdominal pain or GI symptoms at this time.   She had a Roux-en-Y gastric bypass surgery 2007 in [place], [place]. Her weight was 360, she lost to 200, came back to 240. Recently she has had decreased appetite and weight is down now to 231. Patient initially took B12 and iron supplementation but has been off of those supplements for many years. She did undergo an EGD reportedly 2 to 3 years ago for indigestion with negative results. She does not take a proton pump inhibitor and denies NSAIDs. Patient has been off of her mental health medication over the last week.   PAST MEDICAL HISTORY:  1. Depression. 2. Fibromyalgia. 3. Hypertension. 4. Bipolar disorder. 5. Type 2 diabetes mellitus on insulin.  6. 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  7. History of anemia.    PAST SURGICAL HISTORY:  1. Roux-en-Y gastric bypass surgery 2007.  2. Cholecystectomy.  3. Left knee arthroscopy.  4. Cesarean section nine years ago.   MEDICATIONS:  1. Lamictal 200 mg twice daily.  2. Cymbalta 60 mg twice daily.  3. Lyrica 100 mg twice daily.  4. Benicar HCT 40/12.5 mg 1 daily.  5. Adderall 40 mg twice daily.  6. Risperdal 20 mg at bedtime.  7. Xanax 1 mg p.o. every six hours p.r.n. anxiety.  8. Oxycodone 15 mg every six hours p.r.n. pain.  9. Levemir 27 units sub-Q at bedtime.  10. NovoLog sliding scale as directed.   ALLERGIES: ACE inhibitors.   SOCIAL HISTORY: Positive tobacco, 1-1/2 packs per day. Negative alcohol or illicit drug use.   FAMILY HISTORY: Mother with colon polyps. Father is alive and well. Three half aunts with breast cancer. One biological sister alive and well.   REVIEW OF SYSTEMS: 10 systems reviewed. Positive for fatigue, weakness, dizzy, lightheaded, confusion, lethargy. No prior history of colonoscopy. Bowel habits are variable, she reports three weeks ago she saw red in a huge bowel movement. She has had episodes of fecal incontinence, rarely at bedtime associated with hyperglycemia and eating sweets before bedtime. Prior EGD reported, no active heartburn, indigestion, reflux. See history of present illness for GI. Remaining 10 systems negative. Patient says she is feeling much better today. Last menstrual period was a few months ago, slight menstrual cycle.   PHYSICAL EXAMINATION:  VITAL SIGNS: Temperature 98.2, pulse  81, respiratory rate 20, blood pressure 95/63, 98% room air.   GENERAL: Young obese African American female sitting up in bed visiting with her mother in no acute distress.   HEENT: Head is normocephalic. Conjunctivae pale pink. Sclerae anicteric. Oral mucosa is moist and intact.   NECK: Supple without lymphadenopathy. Trachea is midline.   LUNGS: Clear  to auscultation. Respirations are eupneic.   CARDIAC: S1, S2. Positive murmur noted. No gallop. No tachycardia.   ABDOMEN: Soft, protuberant, reports slight discomfort with deep palpation periumbilical area. Abdominal  exam unremarkable.   RECTAL: Deferred. It was guaiac-negative per Emergency Room physician notes, not repeated.   EXTREMITIES: Lower extremities without edema, cyanosis, clubbing.   SKIN: Warm and dry without rash or lesions.   NEUROLOGIC: Cranial nerves II through XII grossly intact. Good historian. Speech is clear.   PSYCH: Affect and mood within normal, cooperative, pleasant. Reports remorse at not taking B12 and iron supplements and her psychiatric medication as directed.   LABORATORY, DIAGNOSTIC, AND RADIOLOGICAL DATA: Admission blood work notable for glucose 339, BUN 24, creatinine 2.28, serum iron 28, ferritin 4, iron saturation 14%, folic acid 9.9, B12 is pending, calcium less than 5.0, total protein 4.1, albumin 1.8, alkaline phosphatase 73, AST 10, ALT 8, troponin less than 0.02, TSH 0.361, sedimentation rate 45, hemoglobin 6.9, hematocrit 21.6, platelet count 157, WBC 8.5.   Repeat laboratory studies 07/10/2012 glucose 224, BUN 27, creatinine 1.84, calcium 7.6, total protein 6.0, albumin 2.6, total bilirubin 0.3, alkaline phosphatase 102, AST 15, ALT 11, hemoglobin 8.9 without transfusion, hematocrit 27.7. Chest x-ray is clear.   IMPRESSION:  1. 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 She was heme negative for digital rectal exam in the Emergency Room. She reports an episode of bright red blood three weeks ago. Constipation is present currently. Previous EGD reported negative 2 to 3 years ago, records unavailable. No history of colonoscopy.  2. Family history of colon polyps in mother.   PLAN: Recommend repeat stool cards. If she remains negative we can do the colonoscopy and EGD as an outpatient. If she has active  heme positive stool would recommend inpatient studies. Patient is constipated, last good bowel movement was over a week ago. Recommend MiraLax, and she can have clear liquid diet well while hemoglobin is being monitored. Agree with IV iron supplementation as patient will not build her iron stores up orally with history of Roux-en-Y surgery. Await B12 results and expect that she will need to have B12 injection supplementation as well. Patient is on IV Protonix. Will check urine pregnancy. Agree with hematology consultation. This case was discussed with Dr. Marva PandaSkulskie in collaboration of care. Further recommendations pending. Thank you for this consultation.   These services provided by Cala BradfordKimberly A. Arvilla MarketMills, MS, APRN, BC, ANP under collaborative agreement with Dr. Marva PandaSkulskie.   ____________________________ Ranae PlumberKimberly A. Arvilla MarketMills, ANP kam:cms D: 07/10/2012 19:27:17 ET T: 07/11/2012 09:53:15 ET JOB#: [id number]  cc: Cala BradfordKimberly A. Arvilla MarketMills, ANP, <Dictator>  Ranae PlumberKimberly A. Suzette BattiestMills RN, MSN, ANP-BC Adult Nurse Practitioner ELECTRONICALLY SIGNED 07/11/2012 16:14

## 2015-04-01 NOTE — Consult Note (Signed)
Chief Complaint:   Subjective/Chief Complaint Patient seen and examined, please see full GI consult. Patient admitted with syncopal/presyncopal episodes in the setting of anemia.  Although patient was heme negative on admission testing, she reports seeing black stoolsover the past 3-4 weeks, not daily.  She has been taking GOODY powders 2 at a time 3-4 times a week..She does not take any ulcer prophylaxis. Patietn with frequent nausea, no emesis.  Patient hasd egd 2 years ago and was placed on sucralfate at that time.  Patietn was told she was anemic by her PMD in june, though no meds or tfx were undertaken.  History of roux-en-y gastric bypass.  Anemia likely multifactorial, major contributors are duodenal exclusion (reduced iron absorbtion) with blood loss as above.  Will plan for EGD tomorrow.  I have discussed the risks benefits and complications of egd to include not limited to bleeding infection perforation and sedation and she wishes to proceed.   VITAL SIGNS/ANCILLARY NOTES: **Vital Signs.:   29-Jul-13 17:27   Vital Signs Type Q 4hr   Temperature Temperature (F) 98.1   Celsius 36.7   Temperature Source axillary   Pulse Pulse 98   Respirations Respirations 20   Systolic BP Systolic BP 101   Diastolic BP (mmHg) Diastolic BP (mmHg) 73   Mean BP 82   Pulse Ox % Pulse Ox % 100   Pulse Ox Activity Level  At rest   Oxygen Delivery Room Air/ 21 %   Electronic Signatures: Barnetta ChapelSkulskie, Mahum Betten (MD)  (Signed 29-Jul-13 20:29)  Authored: Chief Complaint, VITAL SIGNS/ANCILLARY NOTES   Last Updated: 29-Jul-13 20:29 by Barnetta ChapelSkulskie, Biff Rutigliano (MD)

## 2015-04-01 NOTE — Discharge Summary (Signed)
PATIENT NAME:  Virginia [name], Virginia [name] MR#:  [id number] DATE OF BIRTH:  1976-08-05  DATE OF ADMISSION:  07/09/2012 DATE OF DISCHARGE:  07/11/2012  PRIMARY CARE PHYSICIAN: Dr. Velna Hatchet in [place]   FINAL DIAGNOSES:  1. Acute renal failure.  2. Hypotension.  3. Encephalopathy.  4. Anemia, iron deficient.  5. Hypokalemia.  6. Bipolar. 7. Chronic pain.   MEDICATIONS ON DISCHARGE:  1. Cymbalta 60 mg twice a day.  2. Lyrica 100 mg twice a day.  3. Xanax 1 mg p.r.n.  4. Oxycodone 15 mg twice a day. 5. Lamictal 200 mg twice a day. 6. Adderall 20 mg 2 tabs twice a day. 7. Risperdal 2 mg at bedtime. 8. Detemir insulin 24 units at bedtime.  9. Aspartate insulin 4 times a day. 10. Sliding scale.   NEW MEDICATIONS: 1. Omeprazole 20 mg twice a day. 2. Hydrochlorothiazide was restarted back at 12.5 mg daily.   DIET: Low sodium carbohydrate diet, regular consistency.   FOLLOW-UP:  1. Follow-up with Dr. Orlie Dakin in 3 to 4 weeks. 2. Follow-up with Dr. Allyne Gee in 1 to 2 weeks. 3. Follow-up with Dr. Marva Panda in four weeks.   DO NOT TAKE: I advised holding on the Benicar/HCT at this point and no aspirins or anti-inflammatories.   REASON FOR ADMISSION: The patient was admitted 07/09/2012, discharged 07/11/2012. The patient was admitted with recurrent syncope, altered mental status, and found to have anemia.   HISTORY OF PRESENT ILLNESS: The patient is a 39 year old female with history of depression, fibromyalgia, diabetes, and bipolar disorder who presented with near syncope, not acting right, dizziness. In the ER initial blood work, which I am not sure if it was a lab error or not, revealed a hemoglobin of 6.9 so she was admitted with profound anemia, metabolic encephalopathy, acute renal failure, hypokalemia, hypernatremia, hypocalcemia. The patient is young and did not want a transfusion. Iron studies were sent off. Hematology and GI consultations were obtained.    LABORATORY AND RADIOLOGICAL  DATA DURING THE HOSPITAL COURSE: EKG showed sinus tachycardia.  Ferritin 4, iron saturation 14%, iron serum 28, TIBC 203. TSH 0.361. Sedimentation rate 45. Folic acid 9.9. Troponin negative. This chemistry I am not sure if this is correct. Glucose was 214, BUN 24, creatinine 2.28, sodium 148, potassium 2.5, chloride 120, CO2 16, calcium less than 5. Liver function tests normal. Albumin 1.8. Next CBC, which I am not sure is correct, showed a white blood cell count of 8.5, hemoglobin 6.9, hematocrit 21.6, and platelet count of 157. Urinalysis showed trace ketones.   Chest x-ray showed no acute disease in the chest.   ABG showed pH of 7.34, pCO2 39, pO2 84, bicarb 21, oxygen saturation 92.7. Lamictal level 5. Vitamin B12 901. Repeat hemoglobin was 8.6. Repeat chemistry showed a glucose of 249, BUN 32, creatinine 2.68, sodium 142, potassium 3.8, chloride 109, CO2 24, calcium 7.4, albumin 2.7. Liver function tests were okay. Creatinine on the 29th improved to 1.84. Hemoglobin was 8.9.   Repeat chest x-ray negative.  Erythropoietin level 78. Folic acid 15.1. Haptoglobin 324. Direct Coombs negative. LDH 139. Blood feces negative. Pregnancy test negative. Upon discharge creatinine 0.93, potassium 4.0, hemoglobin 8.8.   HOSPITAL COURSE PER PROBLEM LIST:  1. For the patient's acute renal failure, this had improved with IV fluid hydration. Creatinine now normalized upon discharge. Most likely ATN secondary to hypotension.  2. The patient's hypotension had improved with IV fluid hydration. Blood pressure actually elevated upon discharge at 172/93 so low dose  hydrochlorothiazide restarted. Close clinical follow-up as outpatient needed to recheck blood pressure to see if she needs to go back on the Benicar/HCT.  3. Encephalopathy. This had improved. Most likely this was secondary to the hypotension.  4. Iron deficiency anemia. The patient was seen in consultation by both Dr. Marva PandaSkulskie of Gastroenterology and Dr.  Orlie DakinFinnegan of Hematology. Dr. Orlie DakinFinnegan gave IV iron. With her history of the gastric bypass most likely she is not absorbing her iron. Dr. 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 Anastomosis is characterized by ulceration. He recommended PPI twice a day for four weeks and then daily afterwards. Dr. Orlie DakinFinnegan will see in follow-up in the office and give another dose of the IV iron. May have to try Venofer because of the iron that he did give caused itching. Dr. Marva PandaSkulskie will see in the office in about four weeks to consider repeat endoscopy to ensure ulcer healing. The patient was advised not to take any NSAIDs.  5. For the patient's hypokalemia, I think this was initially lab error. Repeat potassiums were good.  6. For bipolar disorder, chronic pain, and fibromyalgia, she is on numerous psychiatric medications which I did not change any of the dosing.  7. For diabetes, she was kept on her sliding scale and Lantus insulin.   After the endoscopy the patient was re-evaluated because the patient may have needed further evaluation here in the hospital for the risk of aspiration. The patient was breathing comfortably on room air. No cough. No shortness of breath. No fever. The patient was discharged home. The patient was advised to call the office back if she had any fever or any symptoms.   TIME SPENT ON DISCHARGE: Close to 60 minutes.  ____________________________ Herschell Dimesichard J. Renae GlossWieting, MD rjw:drc D: 07/12/2012 15:25:30 ET T: 07/13/2012 12:28:11 ET JOB#: [id number]  cc: Herschell Dimesichard J. Renae GlossWieting, MD, <Dictator>, Dr. Vel[place]obin [place], Christena DeemMartin U. Skulskie, MD, Tollie Pizzaimothy J. Orlie DakinFinnegan, MD Salley ScarletICHARD J Lillianne Eick MD ELECTRONICALLY SIGNED 07/20/2012 13:39

## 2015-04-01 NOTE — Consult Note (Signed)
Brief Consult Note: Diagnosis: Severe IDA with hx of Roux en Y gastric bypass 2007-not unexpected. Heme neg in ER. Self reports one episode of BRBPR. Constipation.   Patient was seen by consultant.   Consult note dictated.   Orders entered.   Comments: Plan: Heme negative reported in ER. Recommend another stool card. If negative, can do colon/EGD as outpatient. If heme positive,  would recommend inpatient. Patient feels constipated and she can have Miralax. Clear liquid diet while the hgb trend is monitored. Agree with IV iron supplementation. Await B12 results. One episode of self reported fresh blood per rectum associated with a  huge BM may be related to hemorrhoids. Mother with colon polyps history. Tjis case d/w Dr. Marva PandaSkulskie in collaboration of care..  Electronic Signatures: Rowan BlaseMills, Tenesha Garza Ann (NP)  (Signed 29-Jul-13 16:21)  Authored: Brief Consult Note   Last Updated: 29-Jul-13 16:21 by Rowan BlaseMills, Avayah Raffety Ann (NP)

## 2015-09-30 ENCOUNTER — Ambulatory Visit
Admit: 2015-09-30 | Discharge: 2015-09-30 | Payer: PRIVATE HEALTH INSURANCE | Attending: Family Medicine | Primary: Family Medicine

## 2015-09-30 DIAGNOSIS — M62830 Muscle spasm of back: Secondary | ICD-10-CM

## 2015-09-30 MED ORDER — METHOCARBAMOL 500 MG TAB
500 mg | ORAL_TABLET | Freq: Three times a day (TID) | ORAL | 0 refills | Status: AC | PRN
Start: 2015-09-30 — End: ?

## 2015-09-30 MED ORDER — TRAMADOL 50 MG TAB
50 mg | ORAL_TABLET | Freq: Three times a day (TID) | ORAL | 0 refills | Status: AC | PRN
Start: 2015-09-30 — End: ?

## 2015-09-30 NOTE — Patient Instructions (Addendum)
Learning About Diabetes Food Guidelines  Your Care Instructions  Meal planning is important to manage diabetes. It helps keep your blood sugar at a target level (which you set with your doctor). You don't have to eat special foods. You can eat what your family eats, including sweets once in a while. But you do have to pay attention to how often you eat and how much you eat of certain foods.  You may want to work with a dietitian or a certified diabetes educator (CDE) to help you plan meals and snacks. A dietitian or CDE can also help you lose weight if that is one of your goals.  What should you know about eating carbs?  Managing the amount of carbohydrate (carbs) you eat is an important part of healthy meals when you have diabetes. Carbohydrate is found in many foods.  ?? Learn which foods have carbs. And learn the amounts of carbs in different foods.  ?? Bread, cereal, pasta, and rice have about 15 grams of carbs in a serving. A serving is 1 slice of bread (1 ounce), ?? cup of cooked cereal, or 1/3 cup of cooked pasta or rice.  ?? Fruits have 15 grams of carbs in a serving. A serving is 1 small fresh fruit, such as an apple or orange; ?? of a banana; ?? cup of cooked or canned fruit; ?? cup of fruit juice; 1 cup of melon or raspberries; or 2 tablespoons of dried fruit.  ?? Milk and no-sugar-added yogurt have 15 grams of carbs in a serving. A serving is 1 cup of milk or 2/3 cup of no-sugar-added yogurt.  ?? Starchy vegetables have 15 grams of carbs in a serving. A serving is ?? cup of mashed potatoes or sweet potato; 1 cup winter squash; ?? of a small baked potato; ?? cup of cooked beans; or ?? cup cooked corn or green peas.  ?? Learn how much carbs to eat each day and at each meal. A dietitian or CDE can teach you how to keep track of the amount of carbs you eat. This is called carbohydrate counting.  ?? If you are not sure how to count carbohydrate grams, use the Plate  Method to plan meals. It is a good, quick way to make sure that you have a balanced meal. It also helps you spread carbs throughout the day.  ?? Divide your plate by types of foods. Put non-starchy vegetables on half the plate, meat or other protein food on one-quarter of the plate, and a grain or starchy vegetable in the final quarter of the plate. To this you can add a small piece of fruit and 1 cup of milk or yogurt, depending on how many carbs you are supposed to eat at a meal.  ?? Try to eat about the same amount of carbs at each meal. Do not "save up" your daily allowance of carbs to eat at one meal.  ?? Proteins have very little or no carbs per serving. Examples of proteins are beef, chicken, turkey, fish, eggs, tofu, cheese, cottage cheese, and peanut butter. A serving size of meat is 3 ounces, which is about the size of a deck of cards. Examples of meat substitute serving sizes (equal to 1 ounce of meat) are 1/4 cup of cottage cheese, 1 egg, 1 tablespoon of peanut butter, and ?? cup of tofu.  How can you eat out and still eat healthy?  ?? Learn to estimate the serving sizes of foods that have   carbohydrate. If you measure food at home, it will be easier to estimate the amount in a serving of restaurant food.  ?? If the meal you order has too much carbohydrate (such as potatoes, corn, or baked beans), ask to have a low-carbohydrate food instead. Ask for a salad or green vegetables.  ?? If you use insulin, check your blood sugar before and after eating out to help you plan how much to eat in the future.  ?? If you eat more carbohydrate at a meal than you had planned, take a walk or do other exercise. This will help lower your blood sugar.  What else should you know?  ?? Limit saturated fat, such as the fat from meat and dairy products. This is a healthy choice because people who have diabetes are at higher risk of heart disease. So choose lean cuts of meat and nonfat or low-fat dairy  products. Use olive or canola oil instead of butter or shortening when cooking.  ?? Don't skip meals. Your blood sugar may drop too low if you skip meals and take insulin or certain medicines for diabetes.  ?? Check with your doctor before you drink alcohol. Alcohol can cause your blood sugar to drop too low. Alcohol can also cause a bad reaction if you take certain diabetes medicines.  Follow-up care is a key part of your treatment and safety. Be sure to make and go to all appointments, and call your doctor if you are having problems. It's also a good idea to know your test results and keep a list of the medicines you take.  Where can you learn more?  Go to [employer]  Enter I147 in the search box to learn more about "Learning About Diabetes Food Guidelines."  ?? 2006-2016 [employer]. Care instructions adapted under license by [employer] (which disclaims liability or warranty for this information). This care instruction is for use with your licensed healthcare professional. If you have questions about a medical condition or this instruction, always ask your healthcare professional. [employer] disclaims any warranty or liability for your use of this information.  Content Version: 11.0.578772; Current as of: May 05, 2015        Learning About How to Have a Healthy Back  What causes back pain?     Back pain is often caused by overuse, strain, or injury. For example, people often hurt their backs playing sports or working in the yard, being jolted in a car accident, or lifting something too heavy.  Aging plays a part too. Your bones and muscles tend to lose strength as you age, which makes injury more likely. The spongy discs between the bones of the spine (vertebrae) may suffer from wear and tear and no longer provide enough cushion between the bones. A disc that bulges or breaks open (herniated disc) can press on nerves, causing back pain.   In some people, back pain is the result of arthritis, broken vertebrae caused by bone loss (osteoporosis), illness, or a spine problem.  Although most people have back pain at one time or another, there are steps you can take to make it less likely.  How can you have a healthy back?  Reduce stress on your back through good posture   Slumping or slouching alone may not cause low back pain. But after the back has been strained or injured, bad posture can make pain worse.  ?? Sleep in a position that maintains your back's normal curves and on  a mattress that feels comfortable. Sleep on your side with a pillow between your knees, or sleep on your back with a pillow under your knees. These positions can reduce strain on your back.  ?? Stand and sit up straight. "Good posture" generally means your ears, shoulders, and hips are in a straight line.  ?? If you must stand for a long time, put one foot on a stool, ledge, or box. Switch feet every now and then.  ?? Sit in a chair that is low enough to let you place both feet flat on the floor with both knees nearly level with your hips. If your chair or desk is too high, use a footrest to raise your knees. Place a small pillow, a rolled-up towel, or a lumbar roll in the curve of your back if you need extra support.  ?? Try a kneeling chair, which helps tilt your hips forward. This takes pressure off your lower back.  ?? Try sitting on an exercise ball. It can rock from side to side, which helps keep your back loose.  ?? When driving, keep your knees nearly level with your hips. Sit straight, and drive with both hands on the steering wheel. Your arms should be in a slightly bent position.  Reduce stress on your back through careful lifting   ?? Squat down, bending at the hips and knees only. If you need to, put one knee to the floor and extend your other knee in front of you, bent at a right angle (half kneeling).  ?? Press your chest straight forward. This helps keep your upper back  straight while keeping a slight arch in your low back.  ?? Hold the load as close to your body as possible, at the level of your belly button (navel).  ?? Use your feet to change direction, taking small steps.  ?? Lead with your hips as you change direction. Keep your shoulders in line with your hips as you move.  ?? Set down your load carefully, squatting with your knees and hips only.  Exercise and stretch your back   ?? Do some exercise on most days of the week, if your doctor says it is okay. You can walk, run, swim, or cycle.  ?? Stretch your back muscles. Here are a few exercises to try:  ?? Lie on your back, and gently pull one bent knee to your chest. Put that foot back on the floor, and then pull the other knee to your chest.  ?? Do pelvic tilts. Lie on your back with your knees bent. Tighten your stomach muscles. Pull your belly button (navel) in and up toward your ribs. You should feel like your back is pressing to the floor and your hips and pelvis are slightly lifting off the floor. Hold for 6 seconds while breathing smoothly.  ?? Sit with your back flat against a wall.  ?? Keep your core muscles strong. The muscles of your back, belly (abdomen), and buttocks support your spine.  ?? Pull in your belly and imagine pulling your navel toward your spine. Hold this for 6 seconds, then relax. Remember to keep breathing normally as you tense your muscles.  ?? Do curl-ups. Always do them with your knees bent. Keep your low back on the floor, and curl your shoulders toward your knees using a smooth, slow motion. Keep your arms folded across your chest. If this bothers your neck, try putting your hands behind your neck (not your head), with  your elbows spread apart.  ?? Lie on your back with your knees bent and your feet flat on the floor. Tighten your belly muscles, and then push with your feet and raise your buttocks up a few inches. Hold this position 6 seconds as you continue to  breathe normally, then lower yourself slowly to the floor. Repeat 8 to 12 times.  ?? If you like group exercise, try Pilates or yoga. These classes have poses that strengthen the core muscles.  Lead a healthy lifestyle   ?? Stay at a healthy weight to avoid strain on your back.  ?? Do not smoke. Smoking increases the risk of osteoporosis, which weakens the spine. If you need help quitting, talk to your doctor about stop-smoking programs and medicines. These can increase your chances of quitting for good.  Where can you learn more?  Go to [employer]  Enter L315 in the search box to learn more about "Learning About How to Have a Healthy Back."  ?? 2006-2016 [employer]. Care instructions adapted under license by [employer] (which disclaims liability or warranty for this information). This care instruction is for use with your licensed healthcare professional. If you have questions about a medical condition or this instruction, always ask your healthcare professional. [employer] disclaims any warranty or liability for your use of this information.  Content Version: 11.0.578772; Current as of: May 05, 2015

## 2015-09-30 NOTE — Progress Notes (Signed)
1. Have you been to the ER, urgent care clinic since your last visit?  [facility]    2. Have you seen or consulted any other health care providers [facility] since your last visit?  Include any pap smears or colon screening. Orthopaedist for fall injury

## 2015-09-30 NOTE — Progress Notes (Signed)
HPI  Alejandra Fletcher is a 39 y.o. female that presents with  Chief Complaint   Patient presents with   ??? Hospital Follow Up     back pain/knee pain s/p fall   ??? Medication Refill     requesting tylenol with codeine and valium     She is requesting tylenol #3 and valium refill. She had been taking this for right knee and back muscle spams.she reports she uses ace wrap and crutches.  She has been seen at the ER after slipping on stairs. She has has seen orthopedics for right knee pain and had mri this morning at [facility] and has ortho f/u next week.     On inquiry into her other chronic health issues, she reports she had been hospitalized for mental health issues recently and that her glucose levels were going too low, so her regular insulin was d/c. She uses sliding scale only if needed, but reports that her glucoses usually are good. She denies dangerous thoughts of hurting self or others.       Review of Systems:  ROS:  History obtained from the patient  ?? General: negative for - chills, fever  ?? Respiratory: no cough, shortness of breath  ?? Cardiovascular: no chest pain,  ?? Gastrointestinal: no abdominal pain  ?? Neurological: no numbness, tingling, headache or dizziness, or seizures  ??       Past Medical History   Diagnosis Date   ??? Bipolar affective disorder (HCC)    ??? Diabetes (HCC)    ??? Fibromyalgia    ??? Gastric ulcer    ??? Gastrointestinal disorder      infections   ??? GERD (gastroesophageal reflux disease)    ??? 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 Fainting Mother    ??? Hypertension Mother    ??? Asthma Mother    ??? Obesity Mother    ??? Diabetes Father    ??? Hypertension Father    ??? Gout Father    ??? Alcohol abuse Paternal Grandfather    ??? Other Paternal Grandfather      Nicotine abuse/ Bronchitis   ??? Heart Disease Paternal Grandmother    ??? Heart Surgery Paternal Grandmother    ??? Alzheimer Paternal Grandmother     ??? No Known Problems Sister    ??? No Known Problems Maternal Grandmother    ??? No Known Problems Maternal Grandfather        Past Surgical History   Procedure Laterality Date   ??? Hx cesarean section     ??? Hx gastric bypass     ??? Hx knee arthroscopy     ??? Hx cholecystectomy         Social History     Social History   ??? Marital status: MARRIED     Spouse name: N/A   ??? Number of children: N/A   ??? Years of education: N/A     Occupational History   ??? Not on file.     Social History Main Topics   ??? Smoking status: Current Some Day Smoker     Packs/day: 0.50   ??? Smokeless tobacco: Never Used   ??? Alcohol use 0.0 oz/week     0 Standard drinks or equivalent per week      Comment: occasionally   ??? Drug use: No   ???  Sexual activity: Yes     Partners: Male     Birth control/ protection: None     Other Topics Concern   ??? Military Service No   ??? Blood Transfusions Yes     2004, May 10...after delivery of child   ??? Caffeine Concern No   ??? Occupational Exposure No   ??? Hobby Hazards No   ??? Sleep Concern Yes     Insomnia   ??? Stress Concern Yes     life stressors   ??? Weight Concern Yes     over weight   ??? Special Diet Yes     lifestyle, food changes   ??? Back Care Yes     PT for back problems   ??? Exercise No   ??? Bike Helmet No   ??? Seat Belt Yes   ??? Self-Exams Yes     Social History Narrative       Outpatient Prescriptions Marked as Taking for the 09/30/15 encounter (Office Visit) with Penelope Galas, MD   Medication Sig Dispense Refill   ??? hydrochlorothiazide (HYDRODIURIL) 25 mg tablet Take 25 mg by mouth daily.     ??? acetaminophen-codeine (TYLENOL #3) 300-30 mg per tablet Take 1 Tab by mouth every six (6) hours as needed for Pain.     ??? diazepam (VALIUM) 5 mg tablet Take 5 mg by mouth every six (6) hours as needed for Anxiety.     ??? FLUoxetine (PROZAC) 40 mg capsule Take 40 mg by mouth daily.     ??? divalproex ER (DEPAKOTE ER) 500 mg ER tablet Take 500 mg by mouth two (2) times a day.      ??? insulin aspart (NOVOLOG FLEXPEN) 100 unit/mL flexpen 0-150 = 0 units  151-200 = 2 units  201-250 = 4 units  251-300 = 6 units  301-350 = 8 units  351-400 = 10 units  401+ -> Come to the Emergency Department 1 Package 0       Allergies   Allergen Reactions   ??? Topiramate Anxiety   ??? Ace Inhibitors Nausea and Vomiting   ??? Lamotrigine Other (comments)   ??? Nsaids (Non-Steroidal Anti-Inflammatory Drug) Nausea and Vomiting       Vitals:    09/30/15 0954   BP: 132/81   Pulse: (!) 103   Resp: 18   Temp: 98 ??F (36.7 ??C)   TempSrc: Oral   SpO2: 98%   Weight: 207 lb (93.9 kg)   Height: 5' 1.42" (1.56 m)       Patient's last menstrual period was 09/29/2015 (exact date).    Physical Examination: General appearance - alert, well appearing, and in no acute distress    Mental status - alert, oriented to person, place, and time  Eyes -no redness, drainage, or jaundice  PULM: clear to auscultation, no wheezes, rales or rhonchi, symmetric air entry  CVS - normal rate, regular rhythm, normal S1, S2, no murmurs, rubs, clicks or gallops  GI - soft, nontender, nondistended, no masses or organomegaly   Back exam - lumbar paraspinal muscle tenderness.   Neurological - alert, oriented, normal speech, no focal findings or movement disorder noted   Musculoskeletal - right knee joint tenderness with painful rom, no redness or swelling walking with mild limp.    XR Results (maximum last 3):        Results from Appointment encounter on 06/21/14   XR KNEE RT 3 V   Narrative Right Knee, 4 Views  CPT Code [id number]    Clinical History:  Injury.    Comparison:  None.    Findings:    AP, lateral, and bilateral oblique views of the right knee are submitted for  evaluation.      There is no evidence of acute fracture or dislocation.  No joint effusion is  identified.  Osseous mineralization is normal.  There are tricompartment  osteophytes without significant joint space narrowing.           Impression IMPRESSION:    1.  No acute osseous abnormality.   2.  Mild degenerative changes.        KNEE COMPLETE RT 4 VIEWS 09/09/2015  [facility]  Result Impression   IMPRESSION:    1. No acute findings. Degenerative changes as above. 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   Result Narrative   EXAMINATION: Right knee complete 4 views    INDICATION: Right knee pain    COMPARISON: None    FINDINGS: 4 views of the right knee obtained. No acute fracture or subluxation. Small tricompartmental marginal osteophytes. Trace joint effusion. Probable small osteochondroma emanating from the distal femur medial metaphysis.         Assessment/ Plan    Arley Phenixisha was seen today for hospital follow up and medication refill.    Diagnoses and all orders for this visit:    Muscle spasm of back: pmp profile reviewed and discussed with pt the frequency of pain meds, multiple providers and pharmacies.   Advised pt will give robaxin instead of valium.   -     methocarbamol (ROBAXIN) 500 mg tablet; Take 1 Tab by mouth every eight (8) hours as needed. Drowsiness precautions. Do not combine with other sedating meds.  Indications: MUSCLE SPASM    Right knee pain, unspecified chronicity: advised will give tramadol instead of tylenol with codeine limited quantity. She will f/u with ortho next week.   -     traMADol (ULTRAM) 50 mg tablet; Take 1 Tab by mouth every eight (8) hours as needed for Pain (do not combine with alcohol or other sedating meds. this replaces other pain meds.). Max Daily Amount: 150 mg. Indications: PAIN  -     methocarbamol (ROBAXIN) 500 mg tablet; Take 1 Tab by mouth every eight (8) hours as needed. Drowsiness precautions. Do not combine with other sedating meds.  Indications: MUSCLE SPASM    Type 2 diabetes mellitus without complication, unspecified long term insulin use status (HCC): she reports she will return for fasting labs.  -     METABOLIC PANEL, COMPREHENSIVE; Future  -     LIPID PANEL; Future  -     HEMOGLOBIN A1C WITH EAG; Future         Follow-up Disposition:  Return in about 2 weeks (around 10/14/2015), or if symptoms worsen or fail to improve, for dr garris diabetes consult 30 minutes.  I have reviewed return/Er/medication precautions and  discussed the diagnosis with the patient (or guardian/parent if applicable)  and the intended plan as seen in the above orders.  The patient has been offered and/or received an after-visit summary and questions were answered concerning future plans. Patient verbalized understanding of all instruction.    Medication Side Effects and Warnings were discussed with patient: yes      Penelope GalasLynnette Shyana Kulakowski, MD

## 2015-10-28 ENCOUNTER — Encounter: Attending: Internal Medicine | Primary: Family Medicine

## 2017-02-19 DIAGNOSIS — Z9049 Acquired absence of other specified parts of digestive tract: Secondary | ICD-10-CM | POA: Diagnosis not present

## 2017-02-19 DIAGNOSIS — Z8249 Family history of ischemic heart disease and other diseases of the circulatory system: Secondary | ICD-10-CM | POA: Diagnosis not present

## 2017-02-19 DIAGNOSIS — F17210 Nicotine dependence, cigarettes, uncomplicated: Secondary | ICD-10-CM | POA: Diagnosis not present

## 2017-02-19 DIAGNOSIS — E872 Acidosis: Secondary | ICD-10-CM | POA: Diagnosis not present

## 2017-02-19 DIAGNOSIS — R000 Tachycardia, unspecified: Secondary | ICD-10-CM | POA: Diagnosis not present

## 2017-02-19 DIAGNOSIS — Z888 Allergy status to other drugs, medicaments and biological substances status: Secondary | ICD-10-CM

## 2017-02-19 DIAGNOSIS — Z6839 Body mass index (BMI) 39.0-39.9, adult: Secondary | ICD-10-CM | POA: Diagnosis not present

## 2017-02-19 DIAGNOSIS — Z794 Long term (current) use of insulin: Secondary | ICD-10-CM

## 2017-02-19 DIAGNOSIS — Z825 Family history of asthma and other chronic lower respiratory diseases: Secondary | ICD-10-CM | POA: Diagnosis not present

## 2017-02-19 DIAGNOSIS — E6609 Other obesity due to excess calories: Secondary | ICD-10-CM | POA: Diagnosis not present

## 2017-02-19 DIAGNOSIS — Z9889 Other specified postprocedural states: Secondary | ICD-10-CM | POA: Diagnosis not present

## 2017-02-19 DIAGNOSIS — Z716 Tobacco abuse counseling: Secondary | ICD-10-CM | POA: Diagnosis not present

## 2017-02-19 DIAGNOSIS — E1165 Type 2 diabetes mellitus with hyperglycemia: Secondary | ICD-10-CM | POA: Diagnosis not present

## 2017-02-19 DIAGNOSIS — K219 Gastro-esophageal reflux disease without esophagitis: Secondary | ICD-10-CM | POA: Diagnosis present

## 2017-02-19 DIAGNOSIS — F419 Anxiety disorder, unspecified: Secondary | ICD-10-CM | POA: Diagnosis not present

## 2017-02-19 DIAGNOSIS — Z79899 Other long term (current) drug therapy: Secondary | ICD-10-CM

## 2017-02-19 DIAGNOSIS — M797 Fibromyalgia: Secondary | ICD-10-CM | POA: Diagnosis present

## 2017-02-19 DIAGNOSIS — E876 Hypokalemia: Secondary | ICD-10-CM | POA: Diagnosis not present

## 2017-02-19 DIAGNOSIS — Z9884 Bariatric surgery status: Secondary | ICD-10-CM | POA: Diagnosis not present

## 2017-02-19 DIAGNOSIS — I10 Essential (primary) hypertension: Secondary | ICD-10-CM | POA: Diagnosis present

## 2017-02-19 DIAGNOSIS — E860 Dehydration: Secondary | ICD-10-CM | POA: Diagnosis not present

## 2017-02-19 DIAGNOSIS — N390 Urinary tract infection, site not specified: Secondary | ICD-10-CM | POA: Diagnosis not present

## 2017-02-19 DIAGNOSIS — I159 Secondary hypertension, unspecified: Secondary | ICD-10-CM | POA: Diagnosis present

## 2017-02-19 DIAGNOSIS — Z833 Family history of diabetes mellitus: Secondary | ICD-10-CM

## 2017-02-19 DIAGNOSIS — B9620 Unspecified Escherichia coli [E. coli] as the cause of diseases classified elsewhere: Secondary | ICD-10-CM | POA: Diagnosis present

## 2017-02-19 DIAGNOSIS — N83209 Unspecified ovarian cyst, unspecified side: Secondary | ICD-10-CM | POA: Diagnosis present

## 2017-02-20 ENCOUNTER — Observation Stay (HOSPITAL_COMMUNITY)
Admission: EM | Admit: 2017-02-20 | Discharge: 2017-02-22 | DRG: 690 | Disposition: A | Discharge status: Home / Self Care | Payer: Commercial Managed Care - PPO | Attending: Internal Medicine | Admitting: Internal Medicine

## 2017-02-20 ENCOUNTER — Encounter (HOSPITAL_COMMUNITY): Payer: Self-pay | Admitting: Emergency Medicine

## 2017-02-20 ENCOUNTER — Emergency Department (HOSPITAL_COMMUNITY): Payer: Commercial Managed Care - PPO

## 2017-02-20 DIAGNOSIS — A419 Sepsis, unspecified organism: Secondary | ICD-10-CM | POA: Diagnosis not present

## 2017-02-20 DIAGNOSIS — I10 Essential (primary) hypertension: Secondary | ICD-10-CM | POA: Diagnosis not present

## 2017-02-20 DIAGNOSIS — Z720 Tobacco use: Secondary | ICD-10-CM | POA: Diagnosis present

## 2017-02-20 DIAGNOSIS — E872 Acidosis, unspecified: Secondary | ICD-10-CM | POA: Insufficient documentation

## 2017-02-20 DIAGNOSIS — E860 Dehydration: Secondary | ICD-10-CM

## 2017-02-20 DIAGNOSIS — F419 Anxiety disorder, unspecified: Secondary | ICD-10-CM | POA: Diagnosis not present

## 2017-02-20 DIAGNOSIS — I159 Secondary hypertension, unspecified: Secondary | ICD-10-CM

## 2017-02-20 DIAGNOSIS — Z6839 Body mass index (BMI) 39.0-39.9, adult: Secondary | ICD-10-CM

## 2017-02-20 DIAGNOSIS — R112 Nausea with vomiting, unspecified: Secondary | ICD-10-CM | POA: Diagnosis present

## 2017-02-20 DIAGNOSIS — E119 Type 2 diabetes mellitus without complications: Secondary | ICD-10-CM

## 2017-02-20 DIAGNOSIS — R109 Unspecified abdominal pain: Secondary | ICD-10-CM | POA: Diagnosis not present

## 2017-02-20 DIAGNOSIS — K219 Gastro-esophageal reflux disease without esophagitis: Secondary | ICD-10-CM

## 2017-02-20 DIAGNOSIS — N390 Urinary tract infection, site not specified: Secondary | ICD-10-CM

## 2017-02-20 DIAGNOSIS — E876 Hypokalemia: Secondary | ICD-10-CM

## 2017-02-20 DIAGNOSIS — R1013 Epigastric pain: Secondary | ICD-10-CM | POA: Diagnosis not present

## 2017-02-20 DIAGNOSIS — E6609 Other obesity due to excess calories: Secondary | ICD-10-CM

## 2017-02-20 HISTORY — DX: Depression, unspecified: F32.A

## 2017-02-20 HISTORY — DX: Anxiety disorder, unspecified: F41.9

## 2017-02-20 HISTORY — DX: Major depressive disorder, single episode, unspecified: F32.9

## 2017-02-20 LAB — I-STAT CG4 LACTIC ACID, ED: LACTIC ACID, VENOUS: 5.37 mmol/L — AB (ref 0.5–1.9)

## 2017-02-20 LAB — COMPREHENSIVE METABOLIC PANEL
ALK PHOS: 89 U/L (ref 38–126)
ALT: 19 U/L (ref 14–54)
AST: 23 U/L (ref 15–41)
Albumin: 3.9 g/dL (ref 3.5–5.0)
Anion gap: 11 (ref 5–15)
BILIRUBIN TOTAL: 0.6 mg/dL (ref 0.3–1.2)
BUN: 17 mg/dL (ref 6–20)
CALCIUM: 9.1 mg/dL (ref 8.9–10.3)
CO2: 20 mmol/L — ABNORMAL LOW (ref 22–32)
CREATININE: 1 mg/dL (ref 0.44–1.00)
Chloride: 105 mmol/L (ref 101–111)
GFR calc Af Amer: >60 mL/min (ref >60)
Glucose, Bld: 405 mg/dL — ABNORMAL HIGH (ref 65–99)
Potassium: 3.8 mmol/L (ref 3.5–5.1)
Sodium: 136 mmol/L (ref 135–145)
Total Protein: 8 g/dL (ref 6.5–8.1)

## 2017-02-20 LAB — RAPID URINE DRUG SCREEN, HOSP PERFORMED
AMPHETAMINES: NOT DETECTED
BENZODIAZEPINES: NOT DETECTED
Barbiturates: NOT DETECTED
Cocaine: NOT DETECTED
OPIATES: NOT DETECTED
Tetrahydrocannabinol: NOT DETECTED

## 2017-02-20 LAB — CBC
HCT: 36.6 % (ref 36.0–46.0)
Hemoglobin: 11.9 g/dL — ABNORMAL LOW (ref 12.0–15.0)
MCH: 28.4 pg (ref 26.0–34.0)
MCHC: 32.5 g/dL (ref 30.0–36.0)
MCV: 87.4 fL (ref 78.0–100.0)
PLATELETS: 367 10*3/uL (ref 150–400)
RBC: 4.19 MIL/uL (ref 3.87–5.11)
RDW: 12.7 % (ref 11.5–15.5)
WBC: 11.6 10*3/uL — ABNORMAL HIGH (ref 4.0–10.5)

## 2017-02-20 LAB — URINALYSIS, ROUTINE W REFLEX MICROSCOPIC
Bilirubin Urine: NEGATIVE
HGB URINE DIPSTICK: NEGATIVE
KETONES UR: 5 mg/dL — AB
LEUKOCYTES UA: NEGATIVE
Nitrite: NEGATIVE
PH: 5 (ref 5.0–8.0)
PROTEIN: 100 mg/dL — AB
Specific Gravity, Urine: 1.025 (ref 1.005–1.030)

## 2017-02-20 LAB — LACTIC ACID, PLASMA: LACTIC ACID, VENOUS: 1.7 mmol/L (ref 0.5–1.9)

## 2017-02-20 LAB — PROCALCITONIN

## 2017-02-20 LAB — CBG MONITORING, ED
GLUCOSE-CAPILLARY: 293 mg/dL — AB (ref 65–99)
Glucose-Capillary: 375 mg/dL — ABNORMAL HIGH (ref 65–99)
Glucose-Capillary: 390 mg/dL — ABNORMAL HIGH (ref 65–99)

## 2017-02-20 LAB — I-STAT BETA HCG BLOOD, ED (MC, WL, AP ONLY): I-stat hCG, quantitative: <5 m[IU]/mL (ref <5)

## 2017-02-20 LAB — GLUCOSE, CAPILLARY
GLUCOSE-CAPILLARY: 146 mg/dL — AB (ref 65–99)
GLUCOSE-CAPILLARY: 116 mg/dL — AB (ref 65–99)
Glucose-Capillary: 241 mg/dL — ABNORMAL HIGH (ref 65–99)

## 2017-02-20 LAB — LIPASE, BLOOD: Lipase: 23 U/L (ref 11–51)

## 2017-02-20 MED ORDER — THIAMINE HCL 100 MG/ML IJ SOLN
100.0000 mg | Freq: Every day | INTRAMUSCULAR | Status: DC
  Administered 2017-02-21: 100 mg via INTRAVENOUS
  Filled 2017-02-20: qty 2

## 2017-02-20 MED ORDER — ONDANSETRON HCL 4 MG/2ML IJ SOLN
4.0000 mg | Freq: Three times a day (TID) | INTRAMUSCULAR | Status: DC | PRN
  Administered 2017-02-20 – 2017-02-22 (×5): 4 mg via INTRAVENOUS
  Filled 2017-02-20 (×6): qty 2

## 2017-02-20 MED ORDER — NICOTINE 21 MG/24HR TD PT24
21.0000 mg | MEDICATED_PATCH | Freq: Every day | TRANSDERMAL | Status: DC
  Filled 2017-02-20 (×2): qty 1

## 2017-02-20 MED ORDER — ZOLPIDEM TARTRATE 5 MG PO TABS
5.0000 mg | ORAL_TABLET | Freq: Every evening | ORAL | Status: DC | PRN
  Administered 2017-02-21: 5 mg via ORAL
  Filled 2017-02-20: qty 1

## 2017-02-20 MED ORDER — METOCLOPRAMIDE HCL 5 MG/ML IJ SOLN
10.0000 mg | Freq: Three times a day (TID) | INTRAMUSCULAR | Status: DC | PRN
  Administered 2017-02-20 – 2017-02-21 (×3): 10 mg via INTRAVENOUS
  Filled 2017-02-20 (×3): qty 2

## 2017-02-20 MED ORDER — LOSARTAN POTASSIUM 50 MG PO TABS
25.0000 mg | ORAL_TABLET | Freq: Every day | ORAL | Status: DC
  Administered 2017-02-20 – 2017-02-22 (×3): 25 mg via ORAL
  Filled 2017-02-20 (×4): qty 1

## 2017-02-20 MED ORDER — ENOXAPARIN SODIUM 40 MG/0.4ML ~~LOC~~ SOLN
40.0000 mg | SUBCUTANEOUS | Status: DC
  Administered 2017-02-20 – 2017-02-22 (×3): 40 mg via SUBCUTANEOUS
  Filled 2017-02-20 (×4): qty 0.4

## 2017-02-20 MED ORDER — INSULIN GLARGINE 100 UNIT/ML ~~LOC~~ SOLN
10.0000 [IU] | Freq: Every day | SUBCUTANEOUS | Status: DC
  Administered 2017-02-21 – 2017-02-22 (×2): 10 [IU] via SUBCUTANEOUS
  Filled 2017-02-20 (×2): qty 0.1

## 2017-02-20 MED ORDER — ACETAMINOPHEN 650 MG RE SUPP: 650.0000 mg | Freq: Four times a day (QID) | RECTAL | Status: DC | PRN

## 2017-02-20 MED ORDER — FOLIC ACID 1 MG PO TABS
1.0000 mg | ORAL_TABLET | Freq: Every day | ORAL | Status: DC
  Administered 2017-02-20 – 2017-02-22 (×3): 1 mg via ORAL
  Filled 2017-02-20 (×3): qty 1

## 2017-02-20 MED ORDER — CEFTRIAXONE SODIUM 1 G IJ SOLR
1.0000 g | Freq: Once | INTRAMUSCULAR | Status: AC
  Administered 2017-02-20: 1 g via INTRAVENOUS
  Filled 2017-02-20: qty 10

## 2017-02-20 MED ORDER — SODIUM CHLORIDE 0.9 % IV SOLN
INTRAVENOUS | Status: DC
  Administered 2017-02-20 – 2017-02-21 (×4): via INTRAVENOUS

## 2017-02-20 MED ORDER — ONDANSETRON 4 MG PO TBDP: 4.0000 mg | ORAL_TABLET | Freq: Three times a day (TID) | ORAL | Status: DC | PRN

## 2017-02-20 MED ORDER — DEXTROSE 5 % IV SOLN
1.0000 g | INTRAVENOUS | Status: DC
  Administered 2017-02-21 – 2017-02-22 (×2): 1 g via INTRAVENOUS
  Filled 2017-02-20 (×2): qty 10

## 2017-02-20 MED ORDER — INSULIN GLARGINE 100 UNIT/ML ~~LOC~~ SOLN
15.0000 [IU] | Freq: Once | SUBCUTANEOUS | Status: AC
Start: 2017-02-20 — End: 2017-02-20
  Administered 2017-02-20: 15 [IU] via SUBCUTANEOUS
  Filled 2017-02-20: qty 0.15

## 2017-02-20 MED ORDER — SODIUM CHLORIDE 0.9 % IV BOLUS (SEPSIS)
1000.0000 mL | Freq: Once | INTRAVENOUS | Status: AC
  Administered 2017-02-20: 1000 mL via INTRAVENOUS

## 2017-02-20 MED ORDER — PANTOPRAZOLE SODIUM 40 MG IV SOLR
40.0000 mg | Freq: Two times a day (BID) | INTRAVENOUS | Status: DC
  Administered 2017-02-20 – 2017-02-22 (×5): 40 mg via INTRAVENOUS
  Filled 2017-02-20 (×5): qty 40

## 2017-02-20 MED ORDER — ONDANSETRON 4 MG PO TBDP
4.0000 mg | ORAL_TABLET | Freq: Once | ORAL | Status: AC | PRN
  Administered 2017-02-20: 4 mg via ORAL
  Filled 2017-02-20: qty 1

## 2017-02-20 MED ORDER — LORAZEPAM 1 MG PO TABS
1.0000 mg | ORAL_TABLET | Freq: Four times a day (QID) | ORAL | Status: DC | PRN
  Administered 2017-02-22 (×2): 1 mg via ORAL
  Filled 2017-02-20 (×2): qty 1

## 2017-02-20 MED ORDER — ACETAMINOPHEN 325 MG PO TABS
650.0000 mg | ORAL_TABLET | Freq: Four times a day (QID) | ORAL | Status: DC | PRN
  Administered 2017-02-21: 650 mg via ORAL
  Filled 2017-02-20: qty 2

## 2017-02-20 MED ORDER — AMLODIPINE BESYLATE 10 MG PO TABS
10.0000 mg | ORAL_TABLET | Freq: Every day | ORAL | Status: DC
  Administered 2017-02-20 – 2017-02-22 (×3): 10 mg via ORAL
  Filled 2017-02-20 (×4): qty 1

## 2017-02-20 MED ORDER — PNEUMOCOCCAL VAC POLYVALENT 25 MCG/0.5ML IJ INJ
0.5000 mL | INJECTION | INTRAMUSCULAR | Status: AC
  Administered 2017-02-21: 0.5 mL via INTRAMUSCULAR
  Filled 2017-02-20: qty 0.5

## 2017-02-20 MED ORDER — ADULT MULTIVITAMIN W/MINERALS CH
1.0000 | ORAL_TABLET | Freq: Every day | ORAL | Status: DC
  Administered 2017-02-20 – 2017-02-22 (×3): 1 via ORAL
  Filled 2017-02-20 (×3): qty 1

## 2017-02-20 MED ORDER — IOPAMIDOL (ISOVUE-300) INJECTION 61%
100.0000 mL | Freq: Once | INTRAVENOUS | Status: AC | PRN
  Administered 2017-02-20: 100 mL via INTRAVENOUS

## 2017-02-20 MED ORDER — ALPRAZOLAM 0.5 MG PO TABS
0.5000 mg | ORAL_TABLET | Freq: Every day | ORAL | Status: DC | PRN
  Administered 2017-02-21 – 2017-02-22 (×2): 0.5 mg via ORAL
  Filled 2017-02-20 (×2): qty 1

## 2017-02-20 MED ORDER — METOCLOPRAMIDE HCL 5 MG/ML IJ SOLN
10.0000 mg | Freq: Once | INTRAMUSCULAR | Status: AC
  Administered 2017-02-20: 10 mg via INTRAVENOUS
  Filled 2017-02-20: qty 2

## 2017-02-20 MED ORDER — LORAZEPAM 2 MG/ML IJ SOLN: 0.0000 mg | Freq: Two times a day (BID) | INTRAMUSCULAR | Status: DC

## 2017-02-20 MED ORDER — METOPROLOL TARTRATE 50 MG PO TABS
50.0000 mg | ORAL_TABLET | Freq: Every day | ORAL | Status: DC
Start: 2017-02-20 — End: 2017-02-22
  Administered 2017-02-20 – 2017-02-22 (×3): 50 mg via ORAL
  Filled 2017-02-20 (×3): qty 1

## 2017-02-20 MED ORDER — INSULIN ASPART 100 UNIT/ML ~~LOC~~ SOLN
0.0000 [IU] | Freq: Three times a day (TID) | SUBCUTANEOUS | Status: DC
  Administered 2017-02-20: 1 [IU] via SUBCUTANEOUS
  Administered 2017-02-20: 5 [IU] via SUBCUTANEOUS
  Administered 2017-02-20: 3 [IU] via SUBCUTANEOUS
  Administered 2017-02-21 (×2): 2 [IU] via SUBCUTANEOUS
  Administered 2017-02-21: 1 [IU] via SUBCUTANEOUS
  Administered 2017-02-22 (×2): 2 [IU] via SUBCUTANEOUS

## 2017-02-20 MED ORDER — SODIUM CHLORIDE 0.9% FLUSH
3.0000 mL | Freq: Two times a day (BID) | INTRAVENOUS | Status: DC
  Administered 2017-02-21 – 2017-02-22 (×2): 3 mL via INTRAVENOUS

## 2017-02-20 MED ORDER — IOPAMIDOL (ISOVUE-300) INJECTION 61%
INTRAVENOUS | Status: AC
  Filled 2017-02-20: qty 100

## 2017-02-20 MED ORDER — HYDRALAZINE HCL 20 MG/ML IJ SOLN: 5.0000 mg | INTRAMUSCULAR | Status: DC | PRN

## 2017-02-20 MED ORDER — MORPHINE SULFATE (PF) 4 MG/ML IV SOLN
2.0000 mg | INTRAVENOUS | Status: DC | PRN
  Administered 2017-02-20 – 2017-02-22 (×5): 2 mg via INTRAVENOUS
  Filled 2017-02-20 (×5): qty 1

## 2017-02-20 MED ORDER — LORAZEPAM 2 MG/ML IJ SOLN: 1.0000 mg | Freq: Four times a day (QID) | INTRAMUSCULAR | Status: DC | PRN

## 2017-02-20 MED ORDER — LORAZEPAM 2 MG/ML IJ SOLN
0.0000 mg | Freq: Four times a day (QID) | INTRAMUSCULAR | Status: AC
  Administered 2017-02-20: 1 mg via INTRAVENOUS
  Administered 2017-02-20 (×2): 2 mg via INTRAVENOUS
  Administered 2017-02-21 (×3): 1 mg via INTRAVENOUS
  Filled 2017-02-20 (×6): qty 1

## 2017-02-20 MED ORDER — VITAMIN B-1 100 MG PO TABS
100.0000 mg | ORAL_TABLET | Freq: Every day | ORAL | Status: DC
  Administered 2017-02-20 – 2017-02-22 (×2): 100 mg via ORAL
  Filled 2017-02-20 (×3): qty 1

## 2017-02-20 NOTE — ED Provider Notes (Signed)
[facility]-EMERGENCY DEPT Provider Note   CSN: [id number] Arrival date & time: 02/19/17  2358  By signing my name below, I, Diona BrownerJennifer Gorman, attest that this documentation has been prepared under the direction and in the presence of Gilda Creasehristopher J Rishit Burkhalter, MD. Electronically Signed: Diona BrownerJennifer Gorman, ED Scribe. 02/20/17. 3:10 AM.   History   Chief Complaint Chief Complaint  Patient presents with  . Emesis      HPI Virginia [name] is a 41 y.o. female with a PMHx of DM and HTN, who presents to the Emergency Department complaining of a constant burning sensation in her epigastrium starting yesterday. Associated symptoms include nausea, vomiting, subjective fever, back pain, vaginal odor, and cloudy foul smelling urine. Pt reports recent hospital admission for similar symptoms resulting in urosepsis 2 weeks ago. She also reports chronic RLQ pain secondary to diagnosed ovarian cyst which has not worsened with her current symptoms. Pt states she is not tolerating food or fluids. No noted modifying factors at this time. She denies dysuria, additional complaints.    The history is provided by the patient. No language interpreter was used.    Past Medical History:  Diagnosis Date  . Arthritis   . Bipolar affective (HCC)   . Diabetes mellitus   . Fibromyalgia   . Hypertension     There are no active problems to display for this patient.   Past Surgical History:  Procedure Laterality Date  . CESAREAN SECTION    . CHOLECYSTECTOMY    . GASTRIC BYPASS    . KNEE SURGERY      OB History    No data available       Home Medications    Prior to Admission medications   Medication Sig Start Date End Date Taking? Authorizing Provider  ALPRAZolam Prudy Feeler(XANAX) 0.5 MG tablet Take 0.5 mg by mouth daily as needed for anxiety.  12/22/16  Yes Historical Provider, MD  amLODipine (NORVASC) 10 MG tablet Take 10 mg by mouth every morning. 01/01/17  Yes Historical Provider, MD  insulin glargine (LANTUS) 100  UNIT/ML injection Inject 10 Units into the skin at bedtime.   Yes Historical Provider, MD  losartan (COZAAR) 25 MG tablet Take 25 mg by mouth every morning. 11/24/16  Yes Historical Provider, MD  metoprolol tartrate (LOPRESSOR) 25 MG tablet Take 50 mg by mouth every morning. 11/24/16  Yes Historical Provider, MD    Family History No family history on file.  Social History Social History  Substance Use Topics  . Smoking status: Current Every Day Smoker    Packs/day: 0.50    Types: Cigarettes  . Smokeless tobacco: Never Used  . Alcohol use Yes     Comment: occasional     Allergies   Ace inhibitors   Review of Systems Review of Systems  Constitutional: Positive for fever (subjective).  Gastrointestinal: Positive for abdominal pain, nausea and vomiting.  Genitourinary: Negative for dysuria.  Musculoskeletal: Positive for back pain.  All other systems reviewed and are negative.    Physical Exam Updated Vital Signs BP 178/100 (BP Location: Right Arm)   Pulse 102   Temp 98.9 F (37.2 C) (Oral)   Resp 18   Ht 5\' 2"  (1.575 m)   Wt 217 lb 4.8 oz (98.6 kg)   LMP 02/06/2017   SpO2 93%   BMI 39.74 kg/m   Physical Exam  Constitutional: She is oriented to person, place, and time. She appears well-developed and well-nourished. No distress.  HENT:  Head: Normocephalic and atraumatic.  Right Ear: Hearing normal.  Left Ear: Hearing normal.  Nose: Nose normal.  Mouth/Throat: Oropharynx is clear and moist and mucous membranes are normal.  Eyes: Conjunctivae and EOM are normal. Pupils are equal, round, and reactive to light.  Neck: Normal range of motion. Neck supple.  Cardiovascular: Regular rhythm, S1 normal and S2 normal.  Exam reveals no gallop and no friction rub.   No murmur heard. Pulmonary/Chest: Effort normal and breath sounds normal. No respiratory distress. She exhibits no tenderness.  Abdominal: Soft. Normal appearance and bowel sounds are normal. There is no  hepatosplenomegaly. There is tenderness. There is no rebound, no guarding, no tenderness at McBurney's point and negative Murphy's sign. No hernia.  Epigastric and right-sided tenderness   Musculoskeletal: Normal range of motion.  Neurological: She is alert and oriented to person, place, and time. She has normal strength. No cranial nerve deficit or sensory deficit. Coordination normal. GCS eye subscore is 4. GCS verbal subscore is 5. GCS motor subscore is 6.  Skin: Skin is warm, dry and intact. No rash noted. No cyanosis.  Psychiatric: She has a normal mood and affect. Her speech is normal and behavior is normal. Thought content normal.  Nursing note and vitals reviewed.    ED Treatments / Results  DIAGNOSTIC STUDIES: Oxygen Saturation is 100% on RA, normal by my interpretation.   COORDINATION OF CARE: 1:48 AM-Discussed next steps with pt. Pt verbalized understanding and is agreeable with the plan.    Labs (all labs ordered are listed, but only abnormal results are displayed) Labs Reviewed  COMPREHENSIVE METABOLIC PANEL - Abnormal; Notable for the following:       Result Value   CO2 20 (*)    Glucose, Bld 405 (*)    All other components within normal limits  CBC - Abnormal; Notable for the following:    WBC 11.6 (*)    Hemoglobin 11.9 (*)    All other components within normal limits  URINALYSIS, ROUTINE W REFLEX MICROSCOPIC - Abnormal; Notable for the following:    Color, Urine STRAW (*)    Glucose, UA >=500 (*)    Ketones, ur 5 (*)    Protein, ur 100 (*)    Bacteria, UA RARE (*)    Squamous Epithelial / LPF 0-5 (*)    All other components within normal limits  CBG MONITORING, ED - Abnormal; Notable for the following:    Glucose-Capillary 375 (*)    All other components within normal limits  CBG MONITORING, ED - Abnormal; Notable for the following:    Glucose-Capillary 390 (*)    All other components within normal limits  URINE CULTURE  CULTURE, BLOOD (ROUTINE X 2)    CULTURE, BLOOD (ROUTINE X 2)  LIPASE, BLOOD  I-STAT BETA HCG BLOOD, ED ([facility], [facility], [facility] ONLY)  I-STAT CG4 LACTIC ACID, ED    EKG  EKG Interpretation None       Radiology Ct Abdomen Pelvis W Contrast  Result Date: 02/20/2017 CLINICAL DATA:  Burning epigastric pain, onset yesterday. She also reports chronic right lower quadrant pain. EXAM: CT ABDOMEN AND PELVIS WITH CONTRAST TECHNIQUE: Multidetector CT imaging of the abdomen and pelvis was performed using the standard protocol following bolus administration of intravenous contrast. CONTRAST:  ISOVUE-300 IOPAMIDOL (ISOVUE-300) INJECTION 61% COMPARISON:  11/12/2010 FINDINGS: Lower chest: No significant abnormality Hepatobiliary: Mild diffuse fatty infiltration of the liver. No focal liver lesion. Cholecystectomy. No bile duct dilatation. Pancreas: Unremarkable. No pancreatic ductal dilatation or surrounding inflammatory changes. Spleen:  Normal in size without focal abnormality. Adrenals/Urinary Tract: Adrenal glands are unremarkable. Kidneys are normal, without renal calculi, focal lesion, or hydronephrosis. Bladder is unremarkable. Stomach/Bowel: Prior gastric surgery. Small bowel is unremarkable. Appendix is normal. Colon is unremarkable. Vascular/Lymphatic: No significant vascular findings are present. No enlarged abdominal or pelvic lymph nodes. Reproductive: Uterus and bilateral adnexa are unremarkable. Other: No focal inflammation. No ascites. Small fat containing umbilical hernia. Musculoskeletal: No significant skeletal lesion. IMPRESSION: Mild hepatic steatosis. No acute findings. Normal appendix. Small fat containing umbilical hernia. Electronically Signed   By: Ellery Plunk M.D.   On: 02/20/2017 05:23    Procedures Procedures (including critical care time)  Medications Ordered in ED Medications  insulin glargine (LANTUS) injection 15 Units (not administered)  iopamidol (ISOVUE-300) 61 % injection (not administered)  sodium  chloride 0.9 % bolus 1,000 mL (not administered)    And  sodium chloride 0.9 % bolus 1,000 mL (not administered)    And  sodium chloride 0.9 % bolus 1,000 mL (not administered)  ondansetron (ZOFRAN-ODT) disintegrating tablet 4 mg (4 mg Oral Given 02/20/17 0104)  sodium chloride 0.9 % bolus 1,000 mL (0 mLs Intravenous Stopped 02/20/17 0500)  metoCLOPramide (REGLAN) injection 10 mg (10 mg Intravenous Given 02/20/17 0423)  cefTRIAXone (ROCEPHIN) 1 g in dextrose 5 % 50 mL IVPB (1 g Intravenous New Bag/Given 02/20/17 0424)  iopamidol (ISOVUE-300) 61 % injection 100 mL (100 mLs Intravenous Contrast Given 02/20/17 0500)     Initial Impression / Assessment and Plan / ED Course  I have reviewed the triage vital signs and the nursing notes.  Pertinent labs & imaging results that were available during my care of the patient were reviewed by me and considered in my medical decision making (see chart for details).     Patient presents to the emergency department with complaints of diffuse abdominal pain, epigastric burning. She is experiencing some pain into the flanks bilaterally. She has had nausea and vomiting. She does report cloudy urine that is malodorous. She reports that she was hospitalized twice in the last month or so in the [place] area with similar symptoms. She was treated for sepsis secondary to urinary tract infection including bacteremia.  Patient now experiencing similar symptoms to her previous sepsis evaluation. She was slightly tachycardic, but not febrile. She has a slight leukocytosis. Urinalysis is suggestive of infection. Blood and urine cultures pending. Patient is hyperglycemic. She does have evidence of some acidosis, however there is no anion gap. Does not appear to be DKA. Patient to minister to IV fluids and insulin. Her lactic acid is elevated above 5. A CAT scan was performed of her abdomen and pelvis. No acute pathology is noted. She will require hospitalization for  further management of sepsis secondary to urinary tract infection.  Final Clinical Impressions(s) / ED Diagnoses   Final diagnoses:  Sepsis, due to unspecified organism [facility])  Urinary tract infection without hematuria, site unspecified    New Prescriptions New Prescriptions   No medications on file    I personally performed the services described in this documentation, which was scribed in my presence. The recorded information has been reviewed and is accurate.     Gilda Crease, MD 02/20/17 [phone]

## 2017-02-20 NOTE — Progress Notes (Signed)
Patient seen and examined. Seen while in ED. Admitted after midnight secondary to nausea, vomiting and abdominal. Patient hemodynamically stable, no fever, respiratory distress. Patient w/o source of infection appreciated and mainly with elevated lactic acid. Empirically covered with antibiotics by admitting physician and receiving aggressive hydration by EDP orders. Please refer to H&P written by Dr. Clyde LundborgNiu for further info/details on admission.  Plan: -doubt sepsis; given no source of infection and normal RR, patient with very mild elevation of WBC's and tachycardia, both most likely explained by dehydration. Lactic acid not a component of sepsis criteria, no signs of organ dysfunction on rest of her blood work up or images studies. -will check procalcitonin and discontinue abx's if results suggesting no bacterial infection. -will continue IVF's and supportive care  [place]ia [place]oll[place], Virginia [name] [phone]

## 2017-02-20 NOTE — ED Notes (Signed)
Pt ambulates to BR and back to room w/o difficulty 

## 2017-02-20 NOTE — ED Notes (Signed)
Since today, pt c/o NV x6, headache and back pain 7/10. Pt states she hasn't been able to eat or drink much today.

## 2017-02-20 NOTE — ED Notes (Signed)
RN AND MD NOTIFIED OF PATIENT'S LACTIC ACID LEVEL OF 5.37

## 2017-02-20 NOTE — ED Triage Notes (Signed)
Pt reports having vomiting for the last several hours. Pt reports having epigastric burning. Pt reports pain in back in flank area bilaterally. [facility] and was uncertain of what the CT scan results said but pt was admitted for Sepsis.

## 2017-02-20 NOTE — H&P (Signed)
History and Physical    Virginia [name]:[id number] DOB: 03-Oct-1976 DOA: 02/20/2017  Referring MD/NP/PA:   PCP: Gwynneth Aliment, MD   Patient coming from:  The patient is coming from home.  At baseline, pt is independent for most of ADL.   Chief Complaint: Nausea, vomiting, abdominal pain, bilateral flank pain  HPI: Virginia [name] is a 41 y.o. female with medical history significant of hypertension, diabetes mellitus, anxiety, fibromyalgia, tobacco abuse, bipolar disorder, arthritis, who presents with nausea, vomiting, abdominal pain and bilateral flank pain.  Patient states that she started having nausea, vomiting and abdominal pain last night. She vomited 7 times without blood in vomitus. No diarrhea. Her abdominal pain is located in the epigastric area, 6 out of 10 in severity, nonradiating, sharp. It is not aggravated or alleviated by any factors. Patient also has bilateral flank pain, which is constant, sharp, 6 out of 10 in severity, nonradiating. Patient does not have symptoms of UTI, no dysuria, burning on urination or increased urinary frequency. No hematuria. She states that she was treated for sepsis secondary to UTI twice recently. Patient denies chest pain, SOB, cough, unilateral weakness. No fever or chills. Patient states that she drinks Vodaka, approximately once a week, last drink was Thursday.   ED Course: pt was found to have sepsis with elevated lactate 5.37, WBC 11.6, tachycardia. Lipase is 33, negative pregnancy test, negative urinalysis, CT abdomen/pelvis is not impressive, temperature normal, tachycardia, oxygen saturation 93% on room air, blood sugar 405, with normal anion gap 11, patient is admitted to telemetry bed as inpatient. IV Rocephin was started.  Review of Systems:   General: no fevers, chills, no changes in body weight, has poor appetite, has fatigue HEENT: no blurry vision, hearing changes or sore throat Respiratory: no dyspnea, coughing, wheezing CV: no  chest pain, no palpitations GI: has nausea, vomiting, abdominal pain, no diarrhea, constipation GU: no dysuria, burning on urination, increased urinary frequency, hematuria. Has bilateral flank pain  Ext: no leg edema Neuro: no unilateral weakness, numbness, or tingling, no vision change or hearing loss Skin: no rash, no skin tear. MSK: No muscle spasm, no deformity, no limitation of range of movement in spin Heme: No easy bruising.  Travel history: No recent long distant travel.  Allergy:  Allergies  Allergen Reactions  . Ace Inhibitors Nausea And Vomiting and Cough    Past Medical History:  Diagnosis Date  . Arthritis   . Bipolar affective (HCC)   . Diabetes mellitus   . Fibromyalgia   . Hypertension     Past Surgical History:  Procedure Laterality Date  . CESAREAN SECTION    . CHOLECYSTECTOMY    . GASTRIC BYPASS    . KNEE SURGERY      Social History:  reports that she has been smoking Cigarettes.  She has been smoking about 0.50 packs per day. She has never used smokeless tobacco. She reports that she drinks alcohol. She reports that she does not use drugs.  Family History:  Family History  Problem Relation Age of Onset  . Asthma Mother   . Hypertension Mother   . Hypertension Father   . Diabetes Mellitus II Father      Prior to Admission medications   Medication Sig Start Date End Date Taking? Authorizing Provider  ALPRAZolam Prudy Feeler) 0.5 MG tablet Take 0.5 mg by mouth daily as needed for anxiety.  12/22/16  Yes Historical Provider, MD  amLODipine (NORVASC) 10 MG tablet Take 10 mg by mouth every  morning. 01/01/17  Yes Historical Provider, MD  insulin glargine (LANTUS) 100 UNIT/ML injection Inject 10 Units into the skin at bedtime.   Yes Historical Provider, MD  losartan (COZAAR) 25 MG tablet Take 25 mg by mouth every morning. 11/24/16  Yes Historical Provider, MD  metoprolol tartrate (LOPRESSOR) 25 MG tablet Take 50 mg by mouth every morning. 11/24/16  Yes  Historical Provider, MD    Physical Exam: Vitals:   02/20/17 0008 02/20/17 0010 02/20/17 0142 02/20/17 0603  BP: 163/100  178/100 159/82  Pulse: 101  102 101  Resp: 18  18 18   Temp: 98.9 F (37.2 C)   98.6 F (37 C)  TempSrc: Oral   Oral  SpO2: 100%  93% 100%  Weight:  98.6 kg (217 lb 4.8 oz)    Height:  5\' 2"  (1.575 m)     General: Not in acute distress HEENT:       Eyes: PERRL, EOMI, no scleral icterus.       ENT: No discharge from the ears and nose, no pharynx injection, no tonsillar enlargement.        Neck: No JVD, no bruit, no mass felt. Heme: No neck lymph node enlargement. Cardiac: S1/S2, RRR, No murmurs, No gallops or rubs. Respiratory: No rales, wheezing, rhonchi or rubs. GI: Soft, nondistended, has tenderness in epigastric area, no rebound pain, no organomegaly, BS present. GU: No hematuria. Has bilateral CVA tenderness. Ext: No pitting leg edema bilaterally. 2+DP/PT pulse bilaterally. Musculoskeletal: No joint deformities, No joint redness or warmth, no limitation of ROM in spin. Skin: No rashes.  Neuro: Alert, oriented X3, cranial nerves II-XII grossly intact, moves all extremities normally.  Psych: Patient is not psychotic, no suicidal or hemocidal ideation.  Labs on Admission: I have personally reviewed following labs and imaging studies  CBC:  Recent Labs Lab 02/20/17 0047  WBC 11.6*  HGB 11.9*  HCT 36.6  MCV 87.4  PLT 367   Basic Metabolic Panel:  Recent Labs Lab 02/20/17 0047  NA 136  K 3.8  CL 105  CO2 20*  GLUCOSE 405*  BUN 17  CREATININE 1.00  CALCIUM 9.1   GFR: Estimated Creatinine Clearance: 82.9 mL/min (by C-G formula based on SCr of 1 mg/dL). Liver Function Tests:  Recent Labs Lab 02/20/17 0047  AST 23  ALT 19  ALKPHOS 89  BILITOT 0.6  PROT 8.0  ALBUMIN 3.9    Recent Labs Lab 02/20/17 0047  LIPASE 23   No results for input(s): AMMONIA in the last 168 hours. Coagulation Profile: No results for input(s): INR,  PROTIME in the last 168 hours. Cardiac Enzymes: No results for input(s): CKTOTAL, CKMB, CKMBINDEX, TROPONINI in the last 168 hours. BNP (last 3 results) No results for input(s): PROBNP in the last 8760 hours. HbA1C: No results for input(s): HGBA1C in the last 72 hours. CBG:  Recent Labs Lab 02/20/17 0015 02/20/17 0355  GLUCAP 375* 390*   Lipid Profile: No results for input(s): CHOL, HDL, LDLCALC, TRIG, CHOLHDL, LDLDIRECT in the last 72 hours. Thyroid Function Tests: No results for input(s): TSH, T4TOTAL, FREET4, T3FREE, THYROIDAB in the last 72 hours. Anemia Panel: No results for input(s): VITAMINB12, FOLATE, FERRITIN, TIBC, IRON, RETICCTPCT in the last 72 hours. Urine analysis:    Component Value Date/Time   COLORURINE STRAW (A) 02/20/2017 0034   APPEARANCEUR CLEAR 02/20/2017 0034   APPEARANCEUR Hazy 07/09/2012 2031   LABSPEC 1.025 02/20/2017 0034   LABSPEC 1.016 07/09/2012 2031   PHURINE 5.0 02/20/2017 0034  GLUCOSEU >=500 (A) 02/20/2017 0034   GLUCOSEU 150 mg/dL 16/10/960407/28/2013 [id number]   HGBUR NEGATIVE 02/20/2017 0034   BILIRUBINUR NEGATIVE 02/20/2017 0034   BILIRUBINUR Negative 07/09/2012 2031   KETONESUR 5 (A) 02/20/2017 0034   PROTEINUR 100 (A) 02/20/2017 0034   UROBILINOGEN 1.0 10/25/2012 1509   NITRITE NEGATIVE 02/20/2017 0034   LEUKOCYTESUR NEGATIVE 02/20/2017 0034   LEUKOCYTESUR Negative 07/09/2012 2031   Sepsis Labs: @LABRCNTIP (procalcitonin:4,lacticidven:4) )No results found for this or any previous visit (from the past 240 hour(s)).   Radiological Exams on Admission: Ct Abdomen Pelvis W Contrast  Result Date: 02/20/2017 CLINICAL DATA:  Burning epigastric pain, onset yesterday. She also reports chronic right lower quadrant pain. EXAM: CT ABDOMEN AND PELVIS WITH CONTRAST TECHNIQUE: Multidetector CT imaging of the abdomen and pelvis was performed using the standard protocol following bolus administration of intravenous contrast. CONTRAST:  100mL ISOVUE-300  IOPAMIDOL (ISOVUE-300) INJECTION 61% COMPARISON:  11/12/2010 FINDINGS: Lower chest: No significant abnormality Hepatobiliary: Mild diffuse fatty infiltration of the liver. No focal liver lesion. Cholecystectomy. No bile duct dilatation. Pancreas: Unremarkable. No pancreatic ductal dilatation or surrounding inflammatory changes. Spleen: Normal in size without focal abnormality. Adrenals/Urinary Tract: Adrenal glands are unremarkable. Kidneys are normal, without renal calculi, focal lesion, or hydronephrosis. Bladder is unremarkable. Stomach/Bowel: Prior gastric surgery. Small bowel is unremarkable. Appendix is normal. Colon is unremarkable. Vascular/Lymphatic: No significant vascular findings are present. No enlarged abdominal or pelvic lymph nodes. Reproductive: Uterus and bilateral adnexa are unremarkable. Other: No focal inflammation. No ascites. Small fat containing umbilical hernia. Musculoskeletal: No significant skeletal lesion. IMPRESSION: Mild hepatic steatosis. No acute findings. Normal appendix. Small fat containing umbilical hernia. Electronically Signed   By: Ellery Plunkaniel R Mitchell M.D.   On: 02/20/2017 05:23     EKG: Not done in ED, will get one.  Assessment/Plan Principal Problem:   Sepsis (HCC) Active Problems:   Hypertension   Anxiety   Diabetes mellitus without complication (HCC)   Abdominal pain   Nausea & vomiting   Tobacco abuse   Flank pain   Possible Sepsis: Patient meets criteria for sepsis with elevated lactate of 5.37, leukocytosis, tachycardia. Temperature normal, currently hemodynamically stable. Source of infection is not clear. Urinalysis is negative for leukocyte and nitrate, but has 6-30 WBC. Patient does not have for respiratory symptoms, no chest pain, less likely to have pneumonia. CT abdomen/pelvis is not impressive.  -will admit to tele bed as inpt -IV rocephin was started in ED-->will continue -f/u Bx and Ux -will get Procalcitonin and trend lactic acid levels  per sepsis protocol. -IVF: 4L of NS bolus in ED, followed by 75 cc/h   Nausea, vomiting, abdominal pain: Etiology is not clear. CT abdomen/pelvis is not impressive. Lipase normal 23. Etiology is not clear. Potential differential diagnoses is alcoholic gastritis. -IV Protonix -When necessary Zofran for nausea and morphine for pain -Check UDS  DM-II: Last A1c 11.6 in 02/26/11, poorly controled. Patient is taking Lantus 10 units daily at home. Blood sugar 405, anion gap normal. -pt received 15 units of lantus in ED at 6:00 AM-->will continue lantus 10 units daily (start tomorrow morning) -SSI -Check A1c  HTN: -continue amlodipine, losartan and metoprolol -IV hydralazine when necessary  Anxiety: -Continue home When necessary Xanax  Tobacco abuse: -Did counseling about importance of quitting smoking -Nicotine patch  Alcohol use: pt states that she drinks Vodaka, approximately once per week. Last drinking was on Thursday. Currently no signs of withdraw. -Did counseling about the importance of quitting drinking -CIWA protocol  DVT  ppx: SQ Lovenox Code Status: Full code Family Communication: None at bed side. Disposition Plan:  Anticipate discharge back to previous home environment Consults called:  none Admission status:  Inpatient/tele      Date of Service 02/20/2017    Lorretta Harp [facility] Pager [phone]  If 7PM-7AM, please contact night-coverage [email or web address] Password [facility] 02/20/2017, 7:13 AM

## 2017-02-20 NOTE — ED Notes (Signed)
Active vomiting at triage.

## 2017-02-21 DIAGNOSIS — Z720 Tobacco use: Secondary | ICD-10-CM | POA: Diagnosis not present

## 2017-02-21 DIAGNOSIS — E860 Dehydration: Secondary | ICD-10-CM | POA: Diagnosis not present

## 2017-02-21 DIAGNOSIS — E876 Hypokalemia: Secondary | ICD-10-CM

## 2017-02-21 DIAGNOSIS — F419 Anxiety disorder, unspecified: Secondary | ICD-10-CM

## 2017-02-21 DIAGNOSIS — I10 Essential (primary) hypertension: Secondary | ICD-10-CM

## 2017-02-21 DIAGNOSIS — R6510 Systemic inflammatory response syndrome (SIRS) of non-infectious origin without acute organ dysfunction: Secondary | ICD-10-CM | POA: Diagnosis not present

## 2017-02-21 DIAGNOSIS — K219 Gastro-esophageal reflux disease without esophagitis: Secondary | ICD-10-CM | POA: Diagnosis not present

## 2017-02-21 DIAGNOSIS — N390 Urinary tract infection, site not specified: Secondary | ICD-10-CM | POA: Diagnosis not present

## 2017-02-21 DIAGNOSIS — E119 Type 2 diabetes mellitus without complications: Secondary | ICD-10-CM | POA: Diagnosis not present

## 2017-02-21 DIAGNOSIS — R1013 Epigastric pain: Secondary | ICD-10-CM | POA: Diagnosis not present

## 2017-02-21 DIAGNOSIS — R112 Nausea with vomiting, unspecified: Secondary | ICD-10-CM

## 2017-02-21 DIAGNOSIS — E872 Acidosis: Secondary | ICD-10-CM

## 2017-02-21 DIAGNOSIS — Z6839 Body mass index (BMI) 39.0-39.9, adult: Secondary | ICD-10-CM

## 2017-02-21 DIAGNOSIS — E6609 Other obesity due to excess calories: Secondary | ICD-10-CM

## 2017-02-21 LAB — GLUCOSE, CAPILLARY
GLUCOSE-CAPILLARY: 183 mg/dL — AB (ref 65–99)
Glucose-Capillary: 157 mg/dL — ABNORMAL HIGH (ref 65–99)
Glucose-Capillary: 121 mg/dL — ABNORMAL HIGH (ref 65–99)
Glucose-Capillary: 165 mg/dL — ABNORMAL HIGH (ref 65–99)

## 2017-02-21 LAB — COMPREHENSIVE METABOLIC PANEL
ALBUMIN: 2.9 g/dL — AB (ref 3.5–5.0)
ALK PHOS: 62 U/L (ref 38–126)
ALT: 15 U/L (ref 14–54)
ANION GAP: 8 (ref 5–15)
AST: 18 U/L (ref 15–41)
BUN: 6 mg/dL (ref 6–20)
CALCIUM: 7.9 mg/dL — AB (ref 8.9–10.3)
CHLORIDE: 110 mmol/L (ref 101–111)
CO2: 21 mmol/L — AB (ref 22–32)
Creatinine, Ser: 0.67 mg/dL (ref 0.44–1.00)
GFR calc Af Amer: >60 mL/min (ref >60)
GFR calc non Af Amer: >60 mL/min (ref >60)
GLUCOSE: 179 mg/dL — AB (ref 65–99)
POTASSIUM: 3 mmol/L — AB (ref 3.5–5.1)
SODIUM: 139 mmol/L (ref 135–145)
Total Bilirubin: 0.8 mg/dL (ref 0.3–1.2)
Total Protein: 6.2 g/dL — ABNORMAL LOW (ref 6.5–8.1)

## 2017-02-21 LAB — HIV ANTIBODY (ROUTINE TESTING W REFLEX): HIV SCREEN 4TH GENERATION: NONREACTIVE

## 2017-02-21 MED ORDER — METOCLOPRAMIDE HCL 5 MG/ML IJ SOLN
10.0000 mg | Freq: Four times a day (QID) | INTRAMUSCULAR | Status: DC | PRN
  Administered 2017-02-21 (×2): 10 mg via INTRAVENOUS
  Filled 2017-02-21 (×2): qty 2

## 2017-02-21 MED ORDER — POTASSIUM CHLORIDE CRYS ER 20 MEQ PO TBCR
40.0000 meq | EXTENDED_RELEASE_TABLET | ORAL | Status: AC
  Administered 2017-02-21 (×3): 40 meq via ORAL
  Filled 2017-02-21 (×3): qty 2

## 2017-02-21 MED ORDER — PROMETHAZINE HCL 25 MG/ML IJ SOLN
12.5000 mg | Freq: Four times a day (QID) | INTRAMUSCULAR | Status: DC | PRN
  Filled 2017-02-21: qty 1

## 2017-02-21 NOTE — Progress Notes (Signed)
[facility] PROGRESS NOTE  Virginia [name] NGE:[id number] DOB: 04-30-1976 DOA: 02/20/2017 PCP: Gwynneth Aliment, MD  Interim summary and HPI 41 y.o. female with medical history significant of hypertension, diabetes mellitus, anxiety, fibromyalgia, tobacco abuse, bipolar disorder, arthritis, who presents with nausea, vomiting, abdominal pain and bilateral flank pain. Patient states that she started having nausea, vomiting and abdominal pain last night. She vomited 7 times without blood in vomitus. No diarrhea. Her abdominal pain is located in the epigastric area, 6 out of 10 in severity, nonradiating, sharp. It is not aggravated or alleviated by any factors. Patient also has bilateral flank pain, which is constant, sharp, 6 out of 10 in severity, nonradiating. Patient does not have symptoms of UTI, no dysuria, burning on urination or increased urinary frequency. No hematuria. She states that she was treated for sepsis secondary to UTI twice recently. Patient denies chest pain, SOB, cough, unilateral weakness. No fever or chills. Patient states that she drinks Vodka, approximately once a week, last drink was Thursday.   Assessment/Plan: SIRS due to E. Coli UTI: elevated WBC's and mild tachycardia; mainly associated with dehydration and GI loses -will continue IVF's -will follow cx sensitivity; meanwhile continue rocephin IV -advance diet  -follow clinical response -no sepsis rule in; as patient was afebrile, with normal respiratory status and no signs of organ dysfunction.  Lactic acidosis: most likely from dehydration and GI losses  -patient received aggressive IVF's -lactic acid now WNL  Diabetes type 2: with hyperglycemia -will follow A1C -will continue SSI and lantus  Intractable nausea/vomiting  -clear etiology uncertain: possible alcohol gastritis Vs viral gastroenteritis Vs associated with UTI Vs gastroparesis. -will continue PPI -PRN reglan -slowly advance diet  -control CBG and  treat UTI  HTN: -stable overall -will continue amlodipine, losartan and metoprolol  Anxiety -continue PRN xanax  Tobacco abuse/alcohol consumption  -cessation counseling provided -continue CIWA and nicotine patch  Obesity -Body mass index is 39.74 kg/m. 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 Disposition Plan: remains in the hospital (hopefully until tomorrow); will replete electrolytes, continue hydration, follow culture sensitivity, continue abx's and advance diet. If she tolerates diet; will be able to transition treatment to oral regimen and discharge home.   Consultants:  None   Procedures:  See below for x-ray reports   Antibiotics:  Rocephin 3/11   HPI/Subjective: Afebrile, still with some abdominal pain and nausea. Also reported 1 episode of vomiting overnight.  Objective: Vitals:   02/21/17 0433 02/21/17 0939  BP: (!) 147/87 (!) 162/97  Pulse: 97 87  Resp: 18   Temp: 98.8 F (37.1 C)     Intake/Output Summary (Last 24 hours) at 02/21/17 1256 Last data filed at 02/21/17 0600  Gross per 24 hour  Intake           2192.5 ml  Output                0 ml  Net           2192.5 ml   Filed Weights   02/20/17 0010  Weight: 98.6 kg (217 lb 4.8 oz)    Exam:   General:  Afebrile, no CP, no SOB. Patient reports feeling still nauseated and had vomiting overnight. Patient also endorses some intermittent abd pain. But overall feel better and will like to advance her diet more.  Cardiovascular: S1, S2, no rubs, no  gallops, no JVD  Respiratory: CTA bilaterally  Abdomen: soft, positive BS, diffuse mid abdomen discomfort elicit with deep palpation. No guarding  Musculoskeletal: no edema, no cyanosis   Data Reviewed: Basic Metabolic Panel:  Recent Labs Lab 02/20/17 0047 02/21/17 0451  NA 136 139  K 3.8 3.0*  CL 105  110  CO2 20* 21*  GLUCOSE 405* 179*  BUN 17 6  CREATININE 1.00 0.67  CALCIUM 9.1 7.9*   Liver Function Tests:  Recent Labs Lab 02/20/17 0047 02/21/17 0451  AST 23 18  ALT 19 15  ALKPHOS 89 62  BILITOT 0.6 0.8  PROT 8.0 6.2*  ALBUMIN 3.9 2.9*    Recent Labs Lab 02/20/17 0047  LIPASE 23   CBC:  Recent Labs Lab 02/20/17 0047  WBC 11.6*  HGB 11.9*  HCT 36.6  MCV 87.4  PLT 367   CBG:  Recent Labs Lab 02/20/17 1158 02/20/17 1830 02/20/17 2110 02/21/17 0615 02/21/17 1206  GLUCAP 241* 146* 116* 157* 121*    Recent Results (from the past 240 hour(s))  Urine culture     Status: Abnormal (Preliminary result)   Collection Time: 02/20/17 12:34 AM  Result Value Ref Range Status   Specimen Description URINE, RANDOM  Final   Special Requests NONE  Final   Culture >=100,000 COLONIES/mL ESCHERICHIA COLI (A)  Final   Report Status PENDING  Incomplete     Studies: Ct Abdomen Pelvis W Contrast  Result Date: 02/20/2017 CLINICAL DATA:  Burning epigastric pain, onset yesterday. She also reports chronic right lower quadrant pain. EXAM: CT ABDOMEN AND PELVIS WITH CONTRAST TECHNIQUE: Multidetector CT imaging of the abdomen and pelvis was performed using the standard protocol following bolus administration of intravenous contrast. CONTRAST:  100mL ISOVUE-300 IOPAMIDOL (ISOVUE-300) INJECTION 61% COMPARISON:  11/12/2010 FINDINGS: Lower chest: No significant abnormality Hepatobiliary: Mild diffuse fatty infiltration of the liver. No focal liver lesion. Cholecystectomy. No bile duct dilatation. Pancreas: Unremarkable. No pancreatic ductal dilatation or surrounding inflammatory changes. Spleen: Normal in size without focal abnormality. Adrenals/Urinary Tract: Adrenal glands are unremarkable. Kidneys are normal, without renal calculi, focal lesion, or hydronephrosis. Bladder is unremarkable. Stomach/Bowel: Prior gastric surgery. Small bowel is unremarkable. Appendix is normal. Colon is  unremarkable. Vascular/Lymphatic: No significant vascular findings are present. No enlarged abdominal or pelvic lymph nodes. Reproductive: Uterus and bilateral adnexa are unremarkable. Other: No focal inflammation. No ascites. Small fat containing umbilical hernia. Musculoskeletal: No significant skeletal lesion. IMPRESSION: Mild hepatic steatosis. No acute findings. Normal appendix. Small fat containing umbilical hernia. Electronically Signed   By: Ellery Plunkaniel R Mitchell M.D.   On: 02/20/2017 05:23    Scheduled Meds: . amLODipine  10 mg Oral Daily  . cefTRIAXone (ROCEPHIN)  IV  1 g Intravenous Q24H  . enoxaparin (LOVENOX) injection  40 mg Subcutaneous Q24H  . folic acid  1 mg Oral Daily  . insulin aspart  0-9 Units Subcutaneous TID WC  . insulin glargine  10 Units Subcutaneous Daily  . LORazepam  0-4 mg Intravenous Q6H   Followed by  . [START ON 02/22/2017] LORazepam  0-4 mg Intravenous Q12H  . losartan  25 mg Oral Daily  . metoprolol tartrate  50 mg Oral Daily  . multivitamin with minerals  1 tablet Oral Daily  . nicotine  21 mg Transdermal Daily  . pantoprazole (PROTONIX) IV  40 mg Intravenous Q12H  . potassium chloride  40 mEq Oral Q4H  . sodium chloride flush  3 mL Intravenous Q12H  . thiamine  100 mg  Oral Daily   Or  . thiamine  100 mg Intravenous Daily   Continuous Infusions: . sodium chloride 75 mL/hr at 02/21/17 1001    Time spent: 25 minutes    Vassie Loll  [facility] Pager [phone]. If 7PM-7AM, please contact night-coverage at [email or web address], password [facility] 02/21/2017, 12:56 PM  LOS: 1 day

## 2017-02-22 DIAGNOSIS — K219 Gastro-esophageal reflux disease without esophagitis: Secondary | ICD-10-CM

## 2017-02-22 DIAGNOSIS — N390 Urinary tract infection, site not specified: Secondary | ICD-10-CM

## 2017-02-22 DIAGNOSIS — R112 Nausea with vomiting, unspecified: Secondary | ICD-10-CM | POA: Diagnosis not present

## 2017-02-22 DIAGNOSIS — Z720 Tobacco use: Secondary | ICD-10-CM | POA: Diagnosis not present

## 2017-02-22 DIAGNOSIS — E876 Hypokalemia: Secondary | ICD-10-CM | POA: Diagnosis not present

## 2017-02-22 DIAGNOSIS — E872 Acidosis: Secondary | ICD-10-CM | POA: Diagnosis not present

## 2017-02-22 DIAGNOSIS — Z6839 Body mass index (BMI) 39.0-39.9, adult: Secondary | ICD-10-CM

## 2017-02-22 DIAGNOSIS — I10 Essential (primary) hypertension: Secondary | ICD-10-CM | POA: Diagnosis not present

## 2017-02-22 DIAGNOSIS — E6609 Other obesity due to excess calories: Secondary | ICD-10-CM | POA: Diagnosis not present

## 2017-02-22 DIAGNOSIS — F419 Anxiety disorder, unspecified: Secondary | ICD-10-CM | POA: Diagnosis not present

## 2017-02-22 DIAGNOSIS — E860 Dehydration: Secondary | ICD-10-CM | POA: Diagnosis not present

## 2017-02-22 DIAGNOSIS — E119 Type 2 diabetes mellitus without complications: Secondary | ICD-10-CM | POA: Diagnosis not present

## 2017-02-22 LAB — CBC
HEMATOCRIT: 31.6 % — AB (ref 36.0–46.0)
Hemoglobin: 10.2 g/dL — ABNORMAL LOW (ref 12.0–15.0)
MCH: 28.3 pg (ref 26.0–34.0)
MCHC: 32.3 g/dL (ref 30.0–36.0)
MCV: 87.5 fL (ref 78.0–100.0)
Platelets: 287 10*3/uL (ref 150–400)
RBC: 3.61 MIL/uL — ABNORMAL LOW (ref 3.87–5.11)
RDW: 12.7 % (ref 11.5–15.5)
WBC: 7.7 10*3/uL (ref 4.0–10.5)

## 2017-02-22 LAB — BASIC METABOLIC PANEL
Anion gap: 6 (ref 5–15)
CALCIUM: 8.2 mg/dL — AB (ref 8.9–10.3)
CHLORIDE: 107 mmol/L (ref 101–111)
CO2: 22 mmol/L (ref 22–32)
CREATININE: 0.65 mg/dL (ref 0.44–1.00)
Glucose, Bld: 169 mg/dL — ABNORMAL HIGH (ref 65–99)
Potassium: 3.7 mmol/L (ref 3.5–5.1)
SODIUM: 135 mmol/L (ref 135–145)

## 2017-02-22 LAB — URINE CULTURE

## 2017-02-22 LAB — MAGNESIUM: MAGNESIUM: 1.4 mg/dL — AB (ref 1.7–2.4)

## 2017-02-22 LAB — PROCALCITONIN

## 2017-02-22 LAB — GLUCOSE, CAPILLARY
GLUCOSE-CAPILLARY: 164 mg/dL — AB (ref 65–99)
GLUCOSE-CAPILLARY: 179 mg/dL — AB (ref 65–99)

## 2017-02-22 LAB — HEMOGLOBIN A1C
HEMOGLOBIN A1C: 9 % — AB (ref 4.8–5.6)
MEAN PLASMA GLUCOSE: 212 mg/dL

## 2017-02-22 MED ORDER — CEFUROXIME AXETIL 500 MG PO TABS: 500.0000 mg | ORAL_TABLET | Freq: Two times a day (BID) | ORAL | 0 refills | Status: DC

## 2017-02-22 MED ORDER — MAGNESIUM SULFATE 2 GM/50ML IV SOLN
2.0000 g | Freq: Once | INTRAVENOUS | Status: AC
  Administered 2017-02-22: 2 g via INTRAVENOUS
  Filled 2017-02-22: qty 50

## 2017-02-22 MED ORDER — CENTRUM PO CHEW: 1.0000 | CHEWABLE_TABLET | Freq: Every day | ORAL | 1 refills | Status: DC

## 2017-02-22 MED ORDER — ONDANSETRON 8 MG PO TBDP: 4.0000 mg | ORAL_TABLET | Freq: Three times a day (TID) | ORAL | 0 refills | Status: DC | PRN

## 2017-02-22 MED ORDER — VITAMIN B-12 1000 MCG PO TABS: 1000.0000 ug | ORAL_TABLET | Freq: Every day | ORAL | 1 refills | Status: DC

## 2017-02-22 MED ORDER — CEFUROXIME AXETIL 500 MG PO TABS
500.0000 mg | ORAL_TABLET | Freq: Two times a day (BID) | ORAL | 0 refills | Status: AC
Start: 2017-02-22 — End: 2017-02-27

## 2017-02-22 MED ORDER — NICOTINE 21 MG/24HR TD PT24: 21.0000 mg | MEDICATED_PATCH | Freq: Every day | TRANSDERMAL | 0 refills | Status: DC

## 2017-02-22 MED ORDER — PANTOPRAZOLE SODIUM 40 MG PO TBEC
40.0000 mg | DELAYED_RELEASE_TABLET | Freq: Two times a day (BID) | ORAL | 1 refills | Status: DC
Start: 2017-02-22 — End: 2017-05-27

## 2017-02-22 MED ORDER — PANTOPRAZOLE SODIUM 40 MG PO TBEC: 40.0000 mg | DELAYED_RELEASE_TABLET | Freq: Two times a day (BID) | ORAL | 1 refills | Status: DC

## 2017-02-22 MED ORDER — METOCLOPRAMIDE HCL 10 MG PO TABS: 10.0000 mg | ORAL_TABLET | Freq: Four times a day (QID) | ORAL | 0 refills | Status: DC | PRN

## 2017-02-22 NOTE — Progress Notes (Signed)
Patient discharged home with family, discharge instructions/prescrition given and explained to patient, she verbalized understanding, denies any distress. No wound noted, skin intact. Accompanied home by husband.

## 2017-02-22 NOTE — Discharge Summary (Signed)
Physician Discharge Summary  Virginia [name] ZOX:[id number] DOB: [birth year] DOA: 02/20/2017  PCP: Gwynneth Aliment, MD  Admit date: 02/20/2017 Discharge date: 02/22/2017  Time spent: 35 minutes  Recommendations for Outpatient Follow-up:  Repeat BMET to follow electrolytes and renal function  Reassess BP and adjust medications as needed Close follow up of her DM and adjustment on hypoglycemic regimen as needed    Discharge Diagnoses:  Principal Problem:   Sepsis (HCC) Active Problems:   Hypertension   Anxiety   Diabetes mellitus without complication (HCC)   Abdominal pain   Nausea & vomiting   Tobacco abuse   Flank pain   Urinary tract infection without hematuria   Dehydration   Gastroesophageal reflux disease   Class 2 obesity due to excess calories with body mass index (BMI) of 39.0 to 39.9 in adult   Hypokalemia   Hypomagnesemia   Discharge Condition: stable and improved. Discharge home with instructions to follow up with PCP in 10 days.  Diet recommendation: low sodium and modified carbohydrates   Filed Weights   02/20/17 0010 02/22/17 0556  Weight: 98.6 kg (217 lb 4.8 oz) 99.8 kg (220 lb 0.3 oz)    History of present illness:  41 y.o.femalewith medical history significant of hypertension, diabetes mellitus, anxiety, fibromyalgia, tobacco abuse, bipolar disorder, arthritis, who presents with nausea, vomiting, abdominal pain and bilateral flank pain. Patient states that she started having nausea, vomiting and abdominal pain last night. She vomited 7 times without blood in vomitus. No diarrhea. Her abdominal pain is located in the epigastric area, 6 out of 10 in severity, nonradiating, sharp. It is not aggravated or alleviated by any factors. Patient also has bilateral flank pain, which is constant, sharp, 6 out of 10 in severity, nonradiating. Patient does not have symptoms of UTI, no dysuria, burning on urination or increased urinary frequency. No hematuria. She states that  she was treated for sepsis secondary to UTI twice recently. Patient denies chest pain, SOB,cough, unilateral weakness. No fever or chills. Patient states that she drinks Vodka, last drink was Thursday.   Hospital Course:  SIRS due to E. Coli UTI: elevated WBC's and mild tachycardia; mainly associated with dehydration and GI loses -advise to keep herself well hydrated  -base on sensitivity and now ability to keep PO meds down; will discharge home on Ceftin and complete 5 more days of treatment. -tolerating soft diet -no sepsis rule in; as patient was afebrile, with normal respiratory status and no signs of organ dysfunction.  Lactic acidosis: most likely from dehydration and GI losses  -patient received aggressive IVF's -lactic acid now WNL at discharge  Diabetes type 2: with hyperglycemia and uncontrolled status with elevated A1C -A1C 9.0 -encourage to be compliant with insulin therapy and low carb diet -needs close follow up with PCP for further adjustments on her hypoglycemic regimen.  Intractable nausea/vomiting  -clear etiology uncertain: possible alcohol gastritis Vs viral gastroenteritis Vs associated with UTI Vs gastroparesis. -will continue PPI BID; as patient with significant response in symptoms -PRN reglan prescribed and also PRN zofran -advise to follow small meals multiple times a day -needs better control of her CBG's and will complete treatment for UTI  HTN: -stable overall -will continue amlodipine, losartan and metoprolol -advise to follow low sodium diet   Anxiety -continue PRN xanax  Tobacco abuse/alcohol consumption  -cessation counseling provided -discharge on nicotine patch -patient also discharge on MV (for thiamine and folic acid supplementation) -also started on B12 daily  Obesity -Body mass index  is 39.74 kg/m. 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 Patient reports feeling still nauseated intermittently; but endorses no further vomiting and is tolerating diet and PO meds. Patient ready and asking to go home.  Cardiovascular: S1, S2, no rubs, no gallops, no JVD  Respiratory: CTA bilaterally  Abdomen: soft, positive BS, diffuse mid abdomen discomfort elicit with deep palpation. No guarding (expressed pain is not new, and in fact is much better)  Musculoskeletal: no edema, no cyanosis   Discharge Instructions   Discharge Instructions    Diet - low sodium heart healthy    Complete by:  As directed    Diet Carb Modified    Complete by:  As directed    Discharge instructions    Complete by:  As directed    Small meals multiple times a day Maintain adequate hydration Take medications as prescribed Stop alcohol use Arrange follow up with PCP in 10 days Stop tobacco use     Current Discharge Medication List    START taking these medications   Details  cefUROXime (CEFTIN) 500 MG tablet Take 1 tablet (500 mg total) by mouth 2 (two) times daily with a meal. Qty: 10 tablet, Refills: 0    metoCLOPramide (REGLAN) 10 MG tablet Take 1 tablet (10 mg total) by mouth every 6 (six) hours as needed for refractory nausea / vomiting. Qty: 30 tablet, Refills: 0    multivitamin-iron-minerals-folic acid (CENTRUM) chewable tablet Chew 1 tablet by mouth daily. Qty: 30 tablet, Refills: 1    nicotine (NICODERM CQ - DOSED IN MG/24 HOURS) 21 mg/24hr patch Place 1 patch (21 mg total) onto the skin daily. Qty: 28 patch, Refills: 0    ondansetron (ZOFRAN-ODT) 8 MG disintegrating tablet Take 0.5 tablets (4 mg total) by mouth every 8 (eight) hours as needed for nausea or vomiting. Qty: 30 tablet, Refills: 0     pantoprazole (PROTONIX) 40 MG tablet Take 1 tablet (40 mg total) by mouth 2 (two) times daily. Qty: 60 tablet, Refills: 1    vitamin B-12 (CYANOCOBALAMIN) 1000 MCG tablet Take 1 tablet (1,000 mcg total) by mouth daily. Qty: 30 tablet, Refills: 1      CONTINUE these medications which have NOT CHANGED   Details  ALPRAZolam (XANAX) 0.5 MG tablet Take 0.5 mg by mouth daily as needed for anxiety.     amLODipine (NORVASC) 10 MG tablet Take 10 mg by mouth every morning.    insulin glargine (LANTUS) 100 UNIT/ML injection Inject 10 Units into the skin at bedtime.    losartan (COZAAR) 25 MG tablet Take 25 mg by mouth every morning.    metoprolol tartrate (LOPRESSOR) 25 MG tablet Take 50 mg by mouth every morning.       Allergies  Allergen Reactions  . Ace Inhibitors Nausea And Vomiting and Cough   Follow-up Information    Gwynneth Alimentobyn N Sanders, MD. Schedule an appointment as soon as possible for a visit in 10 day(s).   Specialty:  Internal Medicine Contact information: [address] [place] [address] [phone]  The results of significant diagnostics from this hospitalization (including imaging, microbiology, ancillary and laboratory) are listed below for reference.    Significant Diagnostic Studies: Ct Abdomen Pelvis W Contrast  Result Date: 02/20/2017 CLINICAL DATA:  Burning epigastric pain, onset yesterday. She also reports chronic right lower quadrant pain. EXAM: CT ABDOMEN AND PELVIS WITH CONTRAST TECHNIQUE: Multidetector CT imaging of the abdomen and pelvis was performed using the standard protocol following bolus administration of intravenous contrast. CONTRAST:  ISOVUE-300 IOPAMIDOL (ISOVUE-300) INJECTION 61% COMPARISON:  11/12/2010 FINDINGS: Lower chest: No significant abnormality Hepatobiliary: Mild diffuse fatty infiltration of the liver. No focal liver lesion. Cholecystectomy. No bile duct dilatation. Pancreas: Unremarkable. No pancreatic  ductal dilatation or surrounding inflammatory changes. Spleen: Normal in size without focal abnormality. Adrenals/Urinary Tract: Adrenal glands are unremarkable. Kidneys are normal, without renal calculi, focal lesion, or hydronephrosis. Bladder is unremarkable. Stomach/Bowel: Prior gastric surgery. Small bowel is unremarkable. Appendix is normal. Colon is unremarkable. Vascular/Lymphatic: No significant vascular findings are present. No enlarged abdominal or pelvic lymph nodes. Reproductive: Uterus and bilateral adnexa are unremarkable. Other: No focal inflammation. No ascites. Small fat containing umbilical hernia. Musculoskeletal: No significant skeletal lesion. IMPRESSION: Mild hepatic steatosis. No acute findings. Normal appendix. Small fat containing umbilical hernia. Electronically Signed   By: Ellery Plunk M.D.   On: 02/20/2017 05:23    Microbiology: Recent Results (from the past 240 hour(s))  Urine culture     Status: Abnormal   Collection Time: 02/20/17 12:34 AM  Result Value Ref Range Status   Specimen Description URINE, RANDOM  Final   Special Requests NONE  Final   Culture >=100,000 COLONIES/mL ESCHERICHIA COLI (A)  Final   Report Status 02/22/2017 FINAL  Final   Organism ID, Bacteria ESCHERICHIA COLI (A)  Final      Susceptibility   Escherichia coli - MIC*    AMPICILLIN >=32 RESISTANT Resistant     CEFAZOLIN 8 SENSITIVE Sensitive     CEFTRIAXONE <=1 SENSITIVE Sensitive     CIPROFLOXACIN <=0.25 SENSITIVE Sensitive     GENTAMICIN <=1 SENSITIVE Sensitive     IMIPENEM <=0.25 SENSITIVE Sensitive     NITROFURANTOIN <=16 SENSITIVE Sensitive     TRIMETH/SULFA <=20 SENSITIVE Sensitive     AMPICILLIN/SULBACTAM >=32 RESISTANT Resistant     PIP/TAZO <=4 SENSITIVE Sensitive     Extended ESBL NEGATIVE Sensitive     * >=100,000 COLONIES/mL ESCHERICHIA COLI  Culture, blood (Routine X 2) w Reflex to ID Panel     Status: None (Preliminary result)   Collection Time: 02/20/17  3:19 AM   Result Value Ref Range Status   Specimen Description BLOOD LEFT ANTECUBITAL  Final   Special Requests BOTTLES DRAWN AEROBIC AND ANAEROBIC 5CC EAC  Final   Culture   Final    NO GROWTH 1 DAY Performed at [facility], [address], [place], [place] [address]    Report Status PENDING  Incomplete  Culture, blood (Routine X 2) w Reflex to ID Panel     Status: None (Preliminary result)   Collection Time: 02/20/17  4:20 AM  Result Value Ref Range Status   Specimen Description BLOOD BLOOD RIGHT FOREARM  Final   Special Requests IN PEDIATRIC BOTTLE 4CC  Final   Culture   Final    NO GROWTH 1 DAY Performed at [facility], [address] [place], [place] [address]    Report Status PENDING  Incomplete     Labs: Basic Metabolic Panel:  Recent Labs  Lab 02/20/17 0047 02/21/17 0451 02/22/17 0546  NA 136 139 135  K 3.8 3.0* 3.7  CL 105 110 107  CO2 20* 21* 22  GLUCOSE 405* 179* 169*  BUN 17 6 <5*  CREATININE 1.00 0.67 0.65  CALCIUM 9.1 7.9* 8.2*  MG  --   --  1.4*   Liver Function Tests:  Recent Labs Lab 02/20/17 0047 02/21/17 0451  AST 23 18  ALT 19 15  ALKPHOS 89 62  BILITOT 0.6 0.8  PROT 8.0 6.2*  ALBUMIN 3.9 2.9*    Recent Labs Lab 02/20/17 0047  LIPASE 23   No results for input(s): AMMONIA in the last 168 hours. CBC:  Recent Labs Lab 02/20/17 0047 02/22/17 0546  WBC 11.6* 7.7  HGB 11.9* 10.2*  HCT 36.6 31.6*  MCV 87.4 87.5  PLT 367 287    CBG:  Recent Labs Lab 02/21/17 0615 02/21/17 1206 02/21/17 1707 02/21/17 2109 02/22/17 0719  GLUCAP 157* 121* 183* 165* 164*     Signed:  Vassie Loll MD.  [facility] 02/22/2017, 11:06 AM

## 2017-02-25 LAB — CULTURE, BLOOD (ROUTINE X 2)
CULTURE: NO GROWTH
Culture: NO GROWTH

## 2017-02-28 ENCOUNTER — Encounter (HOSPITAL_COMMUNITY): Payer: Self-pay | Admitting: Emergency Medicine

## 2017-02-28 ENCOUNTER — Emergency Department (HOSPITAL_COMMUNITY)
Admission: EM | Admit: 2017-02-28 | Discharge: 2017-02-28 | Disposition: A | Discharge status: Home / Self Care | Payer: Commercial Managed Care - PPO | Attending: Emergency Medicine | Admitting: Emergency Medicine

## 2017-02-28 DIAGNOSIS — Z794 Long term (current) use of insulin: Secondary | ICD-10-CM | POA: Diagnosis not present

## 2017-02-28 DIAGNOSIS — Z87891 Personal history of nicotine dependence: Secondary | ICD-10-CM | POA: Diagnosis not present

## 2017-02-28 DIAGNOSIS — I10 Essential (primary) hypertension: Secondary | ICD-10-CM | POA: Diagnosis not present

## 2017-02-28 DIAGNOSIS — E119 Type 2 diabetes mellitus without complications: Secondary | ICD-10-CM | POA: Insufficient documentation

## 2017-02-28 DIAGNOSIS — M26621 Arthralgia of right temporomandibular joint: Secondary | ICD-10-CM | POA: Diagnosis not present

## 2017-02-28 DIAGNOSIS — R51 Headache: Secondary | ICD-10-CM | POA: Diagnosis present

## 2017-02-28 MED ORDER — KETOROLAC TROMETHAMINE 30 MG/ML IJ SOLN
30.0000 mg | Freq: Once | INTRAMUSCULAR | Status: AC
  Administered 2017-02-28: 30 mg via INTRAMUSCULAR
  Filled 2017-02-28: qty 1

## 2017-02-28 NOTE — ED Triage Notes (Signed)
Pt reports R side HA and ear pain for the last week. Hx of migraines.

## 2017-02-28 NOTE — ED Provider Notes (Signed)
[facility]-EMERGENCY DEPT Provider Note   CSN: [id number] Arrival date & time: 02/28/17  1257  By signing my name below, I, Sonum Patel, attest that this documentation has been prepared under the direction and in the presence of Newell RubbermaidJeffrey Terel Bann, PA-C. Electronically Signed: Leone PayorSonum Patel, Scribe. 02/28/17. 3:59 PM.  History   Chief Complaint Chief Complaint  Patient presents with  . Headache  . Otalgia    The history is provided by the patient. No language interpreter was used.     HPI Comments: Virginia [name] is a 41 y.o. female who presents to the Emergency Department complaining of constant, unchanged right sided HA with associated right ear pain that began 3-4 days ago. She describes her pain as sharp in nature. She notes a history of migraines but states this feels different than a regular migraine. She has tried Excedrin migraine without relief. She recently had rhinorrhea and cough but states this is resolving. She denies fever, dental pain. She reports increased stress at home.   Past Medical History:  Diagnosis Date  . Anxiety   . Arthritis   . Bipolar affective (HCC)   . Depression   . Diabetes mellitus   . Hypertension     Patient Active Problem List   Diagnosis Date Noted  . Urinary tract infection without hematuria   . Dehydration   . Gastroesophageal reflux disease   . Class 2 obesity due to excess calories with body mass index (BMI) of 39.0 to 39.9 in adult   . Hypokalemia   . Hypomagnesemia   . Anxiety 02/20/2017  . Diabetes mellitus without complication (HCC) 02/20/2017  . Sepsis (HCC) 02/20/2017  . Abdominal pain 02/20/2017  . Nausea & vomiting 02/20/2017  . Tobacco abuse 02/20/2017  . Flank pain 02/20/2017  . Hypertension   . Lactic acidosis     Past Surgical History:  Procedure Laterality Date  . CESAREAN SECTION    . CHOLECYSTECTOMY    . GASTRIC BYPASS    . KNEE SURGERY      OB History    No data available       Home Medications    Prior to  Admission medications   Medication Sig Start Date End Date Taking? Authorizing Provider  ALPRAZolam Prudy Feeler(XANAX) 0.5 MG tablet Take 0.5 mg by mouth daily as needed for anxiety.  12/22/16   Historical Provider, MD  amLODipine (NORVASC) 10 MG tablet Take 10 mg by mouth every morning. 01/01/17   Historical Provider, MD  insulin glargine (LANTUS) 100 UNIT/ML injection Inject 10 Units into the skin at bedtime.    Historical Provider, MD  losartan (COZAAR) 25 MG tablet Take 25 mg by mouth every morning. 11/24/16   Historical Provider, MD  metoCLOPramide (REGLAN) 10 MG tablet Take 1 tablet (10 mg total) by mouth every 6 (six) hours as needed for refractory nausea / vomiting. 02/22/17   Vassie Lollarlos Madera, MD  metoprolol tartrate (LOPRESSOR) 25 MG tablet Take 50 mg by mouth every morning. 11/24/16   Historical Provider, MD  multivitamin-iron-minerals-folic acid (CENTRUM) chewable tablet Chew 1 tablet by mouth daily. 02/22/17   Vassie Lollarlos Madera, MD  nicotine (NICODERM CQ - DOSED IN MG/24 HOURS) 21 mg/24hr patch Place 1 patch (21 mg total) onto the skin daily. 02/22/17   Vassie Lollarlos Madera, MD  ondansetron (ZOFRAN-ODT) 8 MG disintegrating tablet Take 0.5 tablets (4 mg total) by mouth every 8 (eight) hours as needed for nausea or vomiting. 02/22/17   Vassie Lollarlos Madera, MD  pantoprazole (PROTONIX) 40 MG tablet Take  1 tablet (40 mg total) by mouth 2 (two) times daily. 02/22/17   Vassie Loll, MD  vitamin B-12 (CYANOCOBALAMIN) 1000 MCG tablet Take 1 tablet (1,000 mcg total) by mouth daily. 02/22/17   Vassie Loll, MD    Family History Family History  Problem Relation Age of Onset  . Asthma Mother   . Hypertension Mother   . Hypertension Father   . Diabetes Mellitus II Father     Social History Social History  Substance Use Topics  . Smoking status: Former Smoker    Packs/day: 0.50    Types: Cigarettes    Quit date: 02/20/2013  . Smokeless tobacco: Never Used  . Alcohol use Yes     Comment: occasional     Allergies   Ace  inhibitors   Review of Systems Review of Systems  Constitutional: Negative for fever.  HENT: Positive for ear pain. Negative for dental problem and hearing loss.   Neurological: Positive for headaches.  All other systems reviewed and are negative.    Physical Exam Updated Vital Signs BP (!) 142/95 (BP Location: Right Arm)   Pulse 80   Temp 98.1 F (36.7 C) (Oral)   Resp 20   LMP 02/06/2017 Comment: HCG < 5 (02-20-17)  SpO2 100%   Physical Exam  Constitutional: She is oriented to person, place, and time. She appears well-developed and well-nourished.  HENT:  Head: Normocephalic and atraumatic.  Right Ear: Tympanic membrane, external ear and ear canal normal.  Left Ear: Tympanic membrane, external ear and ear canal normal.  Mouth/Throat: Oropharynx is clear and moist. No oropharyngeal exudate, posterior oropharyngeal edema or posterior oropharyngeal erythema.  Right ear normal with no pain with manual manipulation, external canal normal, TMs normal.  Surrounding soft tissue with no signs of infection including redness, warmth to touch or significant tenderness to palpation.  Patient does have tenderness over the right TMJ.  Internal exam shows no abnormalities of the dentition or oropharynx  Cardiovascular: Normal rate.   Pulmonary/Chest: Effort normal.  Neurological: She is alert and oriented to person, place, and time.  Skin: Skin is warm and dry.  Psychiatric: She has a normal mood and affect.  Nursing note and vitals reviewed.    ED Treatments / Results  DIAGNOSTIC STUDIES: Oxygen Saturation is 100% on RA, normal by my interpretation.    COORDINATION OF CARE: 3:56 PM Discussed treatment plan with pt at bedside and pt agreed to plan.   Labs (all labs ordered are listed, but only abnormal results are displayed) Labs Reviewed - No data to display  EKG  EKG Interpretation None       Radiology No results found.  Procedures Procedures (including critical care  time)  Medications Ordered in ED Medications  ketorolac (TORADOL) 30 MG/ML injection 30 mg (30 mg Intramuscular Given 02/28/17 1618)     Initial Impression / Assessment and Plan / ED Course  I have reviewed the triage vital signs and the nursing notes.  Pertinent labs & imaging results that were available during my care of the patient were reviewed by me and considered in my medical decision making (see chart for details).      Final Clinical Impressions(s) / ED Diagnoses   Final diagnoses:  Arthralgia of right temporomandibular joint    41 year old female presents today with otalgia.  Patient has tenderness over the right TMJ.  She has no signs of infectious etiology.  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  I see no concerning etiology on  her exam today that would necessitate further evaluation or management here in the ED setting.  Patient does have a remote history of sepsis, she has no infectious etiology or related complaints.  Patient will be instructed to use mouthguard, Tylenol or ibuprofen as needed for discomfort.  She will follow-up with dentist if symptoms continue to persist, return if they worsen.  She verbalized understanding and agreement to today's plan had no further questions or concerns   New Prescriptions Discharge Medication List as of 02/28/2017  4:23 PM      I personally performed the services described in this documentation, which was scribed in my presence. The recorded information has been reviewed and is accurate.   Eyvonne Mechanic, PA-C 02/28/17 1638    Doug Sou, MD 02/28/17 2329

## 2017-02-28 NOTE — Discharge Instructions (Signed)
Please read attached information. If you experience any new or worsening signs or symptoms please return to the emergency room for evaluation. Please follow-up with your primary care provider or specialist as discussed.  °

## 2017-04-13 ENCOUNTER — Ambulatory Visit
Admission: RE | Admit: 2017-04-13 | Discharge: 2017-04-13 | Disposition: A | Discharge status: Home / Self Care | Payer: Commercial Managed Care - PPO | Source: Ambulatory Visit | Attending: Internal Medicine | Admitting: Internal Medicine

## 2017-04-13 ENCOUNTER — Other Ambulatory Visit: Payer: Self-pay | Admitting: Internal Medicine

## 2017-04-13 DIAGNOSIS — R0602 Shortness of breath: Secondary | ICD-10-CM

## 2017-04-13 DIAGNOSIS — R0600 Dyspnea, unspecified: Secondary | ICD-10-CM

## 2017-04-13 DIAGNOSIS — M25551 Pain in right hip: Secondary | ICD-10-CM

## 2017-04-13 DIAGNOSIS — R0609 Other forms of dyspnea: Secondary | ICD-10-CM

## 2017-05-06 ENCOUNTER — Encounter (HOSPITAL_COMMUNITY): Payer: Self-pay | Admitting: Emergency Medicine

## 2017-05-06 ENCOUNTER — Emergency Department (HOSPITAL_COMMUNITY)
Admission: EM | Admit: 2017-05-06 | Discharge: 2017-05-07 | Disposition: A | Discharge status: Home / Self Care | Payer: Commercial Managed Care - PPO | Attending: Emergency Medicine | Admitting: Emergency Medicine

## 2017-05-06 ENCOUNTER — Emergency Department (HOSPITAL_COMMUNITY): Payer: Commercial Managed Care - PPO

## 2017-05-06 DIAGNOSIS — N342 Other urethritis: Secondary | ICD-10-CM | POA: Diagnosis not present

## 2017-05-06 DIAGNOSIS — R102 Pelvic and perineal pain: Secondary | ICD-10-CM

## 2017-05-06 DIAGNOSIS — Z794 Long term (current) use of insulin: Secondary | ICD-10-CM | POA: Diagnosis not present

## 2017-05-06 DIAGNOSIS — Z79899 Other long term (current) drug therapy: Secondary | ICD-10-CM | POA: Diagnosis not present

## 2017-05-06 DIAGNOSIS — N739 Female pelvic inflammatory disease, unspecified: Secondary | ICD-10-CM | POA: Insufficient documentation

## 2017-05-06 DIAGNOSIS — N760 Acute vaginitis: Secondary | ICD-10-CM | POA: Diagnosis not present

## 2017-05-06 DIAGNOSIS — R55 Syncope and collapse: Secondary | ICD-10-CM

## 2017-05-06 DIAGNOSIS — Z87891 Personal history of nicotine dependence: Secondary | ICD-10-CM | POA: Diagnosis not present

## 2017-05-06 DIAGNOSIS — E119 Type 2 diabetes mellitus without complications: Secondary | ICD-10-CM | POA: Diagnosis not present

## 2017-05-06 DIAGNOSIS — I10 Essential (primary) hypertension: Secondary | ICD-10-CM | POA: Insufficient documentation

## 2017-05-06 DIAGNOSIS — B9689 Other specified bacterial agents as the cause of diseases classified elsewhere: Secondary | ICD-10-CM

## 2017-05-06 DIAGNOSIS — N730 Acute parametritis and pelvic cellulitis: Secondary | ICD-10-CM

## 2017-05-06 DIAGNOSIS — R1031 Right lower quadrant pain: Secondary | ICD-10-CM

## 2017-05-06 LAB — CBC WITH DIFFERENTIAL/PLATELET
Basophils Absolute: 0 10*3/uL (ref 0.0–0.1)
Basophils Relative: 0 %
EOS ABS: 0.2 10*3/uL (ref 0.0–0.7)
EOS PCT: 2 %
HCT: 35.2 % — ABNORMAL LOW (ref 36.0–46.0)
Hemoglobin: 11.1 g/dL — ABNORMAL LOW (ref 12.0–15.0)
LYMPHS ABS: 3.2 10*3/uL (ref 0.7–4.0)
Lymphocytes Relative: 26 %
MCH: 27.8 pg (ref 26.0–34.0)
MCHC: 31.5 g/dL (ref 30.0–36.0)
MCV: 88 fL (ref 78.0–100.0)
MONO ABS: 0.6 10*3/uL (ref 0.1–1.0)
MONOS PCT: 5 %
Neutro Abs: 8.3 10*3/uL — ABNORMAL HIGH (ref 1.7–7.7)
Neutrophils Relative %: 67 %
PLATELETS: 351 10*3/uL (ref 150–400)
RBC: 4 MIL/uL (ref 3.87–5.11)
RDW: 14.9 % (ref 11.5–15.5)
WBC: 12.3 10*3/uL — ABNORMAL HIGH (ref 4.0–10.5)

## 2017-05-06 LAB — URINALYSIS, ROUTINE W REFLEX MICROSCOPIC
Bilirubin Urine: NEGATIVE
GLUCOSE, UA: NEGATIVE mg/dL
Hgb urine dipstick: NEGATIVE
KETONES UR: 5 mg/dL — AB
Nitrite: NEGATIVE
PH: 5 (ref 5.0–8.0)
Protein, ur: NEGATIVE mg/dL
SPECIFIC GRAVITY, URINE: 1.019 (ref 1.005–1.030)

## 2017-05-06 LAB — I-STAT TROPONIN, ED: TROPONIN I, POC: 0 ng/mL (ref 0.00–0.08)

## 2017-05-06 LAB — WET PREP, GENITAL
SPERM: NONE SEEN
Trich, Wet Prep: NONE SEEN
Yeast Wet Prep HPF POC: NONE SEEN

## 2017-05-06 LAB — COMPREHENSIVE METABOLIC PANEL
ALK PHOS: 71 U/L (ref 38–126)
ALT: 17 U/L (ref 14–54)
ANION GAP: 13 (ref 5–15)
AST: 23 U/L (ref 15–41)
Albumin: 3.4 g/dL — ABNORMAL LOW (ref 3.5–5.0)
BUN: 21 mg/dL — ABNORMAL HIGH (ref 6–20)
CALCIUM: 8 mg/dL — AB (ref 8.9–10.3)
CO2: 18 mmol/L — AB (ref 22–32)
Chloride: 100 mmol/L — ABNORMAL LOW (ref 101–111)
Creatinine, Ser: 1.47 mg/dL — ABNORMAL HIGH (ref 0.44–1.00)
GFR calc non Af Amer: 44 mL/min — ABNORMAL LOW (ref >60)
GFR, EST AFRICAN AMERICAN: 51 mL/min — AB (ref >60)
Glucose, Bld: 287 mg/dL — ABNORMAL HIGH (ref 65–99)
Potassium: 4.1 mmol/L (ref 3.5–5.1)
SODIUM: 131 mmol/L — AB (ref 135–145)
Total Bilirubin: 0.5 mg/dL (ref 0.3–1.2)
Total Protein: 6.8 g/dL (ref 6.5–8.1)

## 2017-05-06 LAB — I-STAT BETA HCG BLOOD, ED (MC, WL, AP ONLY): I-stat hCG, quantitative: <5 m[IU]/mL (ref <5)

## 2017-05-06 MED ORDER — METOPROLOL TARTRATE 5 MG/5ML IV SOLN
10.0000 mg | Freq: Once | INTRAVENOUS | Status: AC
  Administered 2017-05-07: 10 mg via INTRAVENOUS
  Filled 2017-05-06: qty 10

## 2017-05-06 MED ORDER — METRONIDAZOLE 500 MG PO TABS
500.0000 mg | ORAL_TABLET | Freq: Once | ORAL | Status: AC
  Administered 2017-05-06: 500 mg via ORAL
  Filled 2017-05-06: qty 1

## 2017-05-06 MED ORDER — DOXYCYCLINE HYCLATE 100 MG PO CAPS: 100.0000 mg | ORAL_CAPSULE | Freq: Two times a day (BID) | ORAL | 0 refills | Status: DC

## 2017-05-06 MED ORDER — MORPHINE SULFATE (PF) 4 MG/ML IV SOLN
4.0000 mg | Freq: Once | INTRAVENOUS | Status: AC
  Administered 2017-05-06: 4 mg via INTRAVENOUS
  Filled 2017-05-06: qty 1

## 2017-05-06 MED ORDER — DEXTROSE 5 % IV SOLN
1.0000 g | Freq: Once | INTRAVENOUS | Status: AC
  Administered 2017-05-06: 1 g via INTRAVENOUS
  Filled 2017-05-06: qty 10

## 2017-05-06 MED ORDER — HYDROCODONE-ACETAMINOPHEN 5-325 MG PO TABS: 1.0000 | ORAL_TABLET | Freq: Four times a day (QID) | ORAL | 0 refills | Status: DC | PRN

## 2017-05-06 MED ORDER — ONDANSETRON 4 MG PO TBDP: ORAL_TABLET | ORAL | 0 refills | Status: DC

## 2017-05-06 MED ORDER — AZITHROMYCIN 1 G PO PACK
1.0000 g | PACK | Freq: Once | ORAL | Status: AC
  Administered 2017-05-06: 1 g via ORAL
  Filled 2017-05-06: qty 1

## 2017-05-06 MED ORDER — PROMETHAZINE HCL 25 MG/ML IJ SOLN: 25.0000 mg | Freq: Once | INTRAMUSCULAR | Status: DC

## 2017-05-06 MED ORDER — SODIUM CHLORIDE 0.9 % IV BOLUS (SEPSIS)
1000.0000 mL | Freq: Once | INTRAVENOUS | Status: AC
  Administered 2017-05-06: 1000 mL via INTRAVENOUS

## 2017-05-06 MED ORDER — PROMETHAZINE HCL 25 MG/ML IJ SOLN
25.0000 mg | Freq: Once | INTRAMUSCULAR | Status: AC
Start: 2017-05-06 — End: 2017-05-06
  Administered 2017-05-06: 25 mg via INTRAVENOUS
  Filled 2017-05-06: qty 1

## 2017-05-06 NOTE — ED Triage Notes (Signed)
Per [facility], Pt reports RLQ pain that occurred suddenly this evening after dinner. Pt was clammy and diaphoretic. Pt complains of N/V, dizziness on standing and feeling like she might pass out. Pt reported some chest tightness that radiated down R arm en route, subsided within a few minutes. EMS gave 324 mg ASA and 4 mg Zofran en route. EMS EKG showed R BBB on EMS. Pt complaining of intermittent stabbing pain in RLQ 7/10.

## 2017-05-06 NOTE — Discharge Instructions (Addendum)
Stay hydrated.   Take zofran for nausea.   Take motrin, tylenol for pain.   Take vicodin for severe pain.   Take doxycycline for urinary tract infection.   You have bacterial vaginosis. Take flagyl as prescribed (sent to your pharmacy).  Your heart rate is high. Restart metoprolol as prescribed by your doctor.   See your doctor next week.   Return to ER if you have worse abdominal pain, vomiting, fever, passing out.

## 2017-05-06 NOTE — ED Notes (Signed)
Patient transported to Ultrasound 

## 2017-05-06 NOTE — ED Provider Notes (Addendum)
[facility]-EMERGENCY DEPT Provider Note   CSN: [id number] Arrival date & time: 05/06/17  2051     History   Chief Complaint Chief Complaint  Patient presents with  . Abdominal Pain    HPI Virginia [name] is a 41 y.o. female history of bipolar, depression, diabetes, hypertension here presenting with right lower quadrant pain, dizziness, near syncope. Patient states that she had acute onset of right lower quadrant Pain that is sharp. She states that she felt lightheaded and dizzy like she is going to pass out. Also has some chest tightness as well.  She felt nauseated and clammy at that time. She states that she had some vaginal discharge for several days. Her metoprolol was stopped a month ago and she had intermittent palpitations and RLQ pain for the last several weeks.   Of note, patient states that she was admitted for urosepsis several months ago. Denies any dysuria or fever.   The history is provided by the patient.    Past Medical History:  Diagnosis Date  . Anxiety   . Arthritis   . Bipolar affective (HCC)   . Depression   . Diabetes mellitus   . Hypertension     Patient Active Problem List   Diagnosis Date Noted  . Urinary tract infection without hematuria   . Dehydration   . Gastroesophageal reflux disease   . Class 2 obesity due to excess calories with body mass index (BMI) of 39.0 to 39.9 in adult   . Hypokalemia   . Hypomagnesemia   . Anxiety 02/20/2017  . Diabetes mellitus without complication (HCC) 02/20/2017  . Sepsis (HCC) 02/20/2017  . Abdominal pain 02/20/2017  . Nausea & vomiting 02/20/2017  . Tobacco abuse 02/20/2017  . Flank pain 02/20/2017  . Hypertension   . Lactic acidosis     Past Surgical History:  Procedure Laterality Date  . CESAREAN SECTION    . CHOLECYSTECTOMY    . GASTRIC BYPASS    . KNEE SURGERY      OB History    No data available       Home Medications    Prior to Admission medications   Medication Sig Start Date End Date  Taking? Authorizing Provider  ALPRAZolam Prudy Feeler) 0.5 MG tablet Take 0.5 mg by mouth daily as needed for anxiety.  12/22/16   [provider]  amLODipine (NORVASC) 10 MG tablet Take 10 mg by mouth every morning. 01/01/17   [provider]  doxycycline (VIBRAMYCIN) 100 MG capsule Take 1 capsule (100 mg total) by mouth 2 (two) times daily. One po bid x 7 days 05/06/17   Charlynne Pander, MD  HYDROcodone-acetaminophen (NORCO/VICODIN) 5-325 MG tablet Take 1 tablet by mouth every 6 (six) hours as needed. 05/06/17   Charlynne Pander, MD  insulin glargine (LANTUS) 100 UNIT/ML injection Inject 10 Units into the skin at bedtime.    [provider]  losartan (COZAAR) 25 MG tablet Take 25 mg by mouth every morning. 11/24/16   [provider]  metoCLOPramide (REGLAN) 10 MG tablet Take 1 tablet (10 mg total) by mouth every 6 (six) hours as needed for refractory nausea / vomiting. 02/22/17   Vassie Loll, MD  metoprolol tartrate (LOPRESSOR) 25 MG tablet Take 50 mg by mouth every morning. 11/24/16   [provider]  metroNIDAZOLE (FLAGYL) 500 MG tablet Take 1 tablet (500 mg total) by mouth 2 (two) times daily. One po bid x 7 days 05/07/17   Charlynne Pander, MD  multivitamin-iron-minerals-folic acid (CENTRUM) chewable tablet Chew 1 tablet by mouth daily. 02/22/17   Vassie Loll, MD  nicotine (NICODERM CQ - DOSED IN MG/24 HOURS) 21 mg/24hr patch Place 1 patch (21 mg total) onto the skin daily. 02/22/17   Vassie Loll, MD  ondansetron (ZOFRAN ODT) 4 MG disintegrating tablet 4mg  ODT q6 hours prn nausea/vomit 05/06/17   Charlynne Pander, MD  pantoprazole (PROTONIX) 40 MG tablet Take 1 tablet (40 mg total) by mouth 2 (two) times daily. 02/22/17   Vassie Loll, MD  vitamin B-12 (CYANOCOBALAMIN) 1000 MCG tablet Take 1 tablet (1,000 mcg total) by mouth daily. 02/22/17   Vassie Loll, MD    Family History Family History  Problem Relation Age of Onset  . Asthma Mother    . Hypertension Mother   . Hypertension Father   . Diabetes Mellitus II Father     Social History Social History  Substance Use Topics  . Smoking status: Former Smoker    Packs/day: 0.50    Types: Cigarettes    Quit date: 02/20/2013  . Smokeless tobacco: Never Used  . Alcohol use Yes     Comment: occasional     Allergies   Ace inhibitors   Review of Systems Review of Systems  Gastrointestinal: Positive for abdominal pain.  All other systems reviewed and are negative.    Physical Exam Updated Vital Signs BP 139/75   Pulse (!) 117   Temp 99 F (37.2 C) (Oral)   Resp (!) 22   LMP 04/21/2017   SpO2 98%   Physical Exam  Constitutional: She is oriented to person, place, and time.  Uncomfortable   HENT:  Head: Normocephalic.  MM slightly dry   Eyes: EOM are normal. Pupils are equal, round, and reactive to light.  Neck: Normal range of motion. Neck supple.  Cardiovascular: Normal rate, regular rhythm and normal heart sounds.   Pulmonary/Chest: Effort normal and breath sounds normal. No respiratory distress. She has no wheezes. She has no rales.  Abdominal: Soft. Bowel sounds are normal.  + RLQ vs R pelvic tenderness   Genitourinary:  Genitourinary Comments: Mild CMT and R adnexal tenderness. No uterine tenderness, whitish discharge   Musculoskeletal: Normal range of motion.  Neurological: She is alert and oriented to person, place, and time. No cranial nerve deficit. Coordination normal.  Skin: Skin is warm.  Psychiatric: She has a normal mood and affect.  Nursing note and vitals reviewed.    ED Treatments / Results  Labs (all labs ordered are listed, but only abnormal results are displayed) Labs Reviewed  WET PREP, GENITAL - Abnormal; Notable for the following:       Result Value   Clue Cells Wet Prep HPF POC PRESENT (*)    WBC, Wet Prep HPF POC FEW (*)    All other components within normal limits  CBC WITH DIFFERENTIAL/PLATELET - Abnormal; Notable for  the following:    WBC 12.3 (*)    Hemoglobin 11.1 (*)    HCT 35.2 (*)    Neutro Abs 8.3 (*)    All other components within normal limits  COMPREHENSIVE METABOLIC PANEL - Abnormal; Notable for the following:    Sodium 131 (*)    Chloride 100 (*)    CO2 18 (*)    Glucose, Bld 287 (*)    BUN 21 (*)    Creatinine, Ser 1.47 (*)    Calcium 8.0 (*)    Albumin 3.4 (*)    GFR calc non Af  Amer 44 (*)    GFR calc Af Amer 51 (*)    All other components within normal limits  URINALYSIS, ROUTINE W REFLEX MICROSCOPIC - Abnormal; Notable for the following:    APPearance HAZY (*)    Ketones, ur 5 (*)    Leukocytes, UA MODERATE (*)    Bacteria, UA MANY (*)    Squamous Epithelial / LPF 6-30 (*)    All other components within normal limits  URINE CULTURE  I-STAT BETA HCG BLOOD, ED ([facility], [facility], [facility] ONLY)  I-STAT TROPOININ, ED  GC/CHL[facility]DIA PROBE AMP ([place]    EKG  EKG Interpretation  Date/Time:  Friday May 06 2017 20:55:07 EDT Ventricular Rate:  110 PR Interval:    QRS Duration: 124 QT Interval:  367 QTC Calculation: 497 R Axis:   55 Text Interpretation:  Sinus tachycardia Right bundle branch block No significant change since last tracing Confirmed by YAO  MD, DAVID ([id number]) on 05/06/2017 11:37:54 PM       Radiology [place] Transvaginal Non-ob  Result Date: 05/06/2017 CLINICAL DATA:  Right lower quadrant pain today. EXAM: TRANSABDOMINAL AND TRANSVAGINAL ULTRASOUND OF PELVIS DOPPLER ULTRASOUND OF OVARIES TECHNIQUE: Both transabdominal and transvaginal ultrasound examinations of the pelvis were performed. Transabdominal technique was performed for global imaging of the pelvis including uterus, ovaries, adnexal regions, and pelvic cul-de-sac. It was necessary to proceed with endovaginal exam following the transabdominal exam to visualize the uterus and ovaries. Color and duplex Doppler ultrasound was utilized to evaluate blood flow to the ovaries. COMPARISON:  None. FINDINGS: Uterus  Measurements: 6.6 x 2.7 x 4.3 cm. No fibroids or other mass visualized. Endometrium Thickness: 7 mm.  No focal abnormality visualized. Right ovary Measurements: 2.8 x 2.5 x 3.4 cm. Normal appearance/no adnexal mass. Left ovary Measurements: 2.6 x 1.6 x 2 cm. Limited visualization only on transabdominal images. Unable to visualize transvaginally. Normal appearance/no adnexal mass. Pulsed Doppler evaluation of both ovaries demonstrates normal low-resistance arterial and venous waveforms. Other findings No abnormal free fluid. IMPRESSION: Normal ultrasound appearance of the uterus and ovaries. No evidence of ovarian mass or torsion. Electronically Signed   By: Burman NievesWilliam  Stevens M.D.   On: 05/06/2017 22:37   [place] Pelvis Complete  Result Date: 05/06/2017 CLINICAL DATA:  Right lower quadrant pain today. EXAM: TRANSABDOMINAL AND TRANSVAGINAL ULTRASOUND OF PELVIS DOPPLER ULTRASOUND OF OVARIES TECHNIQUE: Both transabdominal and transvaginal ultrasound examinations of the pelvis were performed. Transabdominal technique was performed for global imaging of the pelvis including uterus, ovaries, adnexal regions, and pelvic cul-de-sac. It was necessary to proceed with endovaginal exam following the transabdominal exam to visualize the uterus and ovaries. Color and duplex Doppler ultrasound was utilized to evaluate blood flow to the ovaries. COMPARISON:  None. FINDINGS: Uterus Measurements: 6.6 x 2.7 x 4.3 cm. No fibroids or other mass visualized. Endometrium Thickness: 7 mm.  No focal abnormality visualized. Right ovary Measurements: 2.8 x 2.5 x 3.4 cm. Normal appearance/no adnexal mass. Left ovary Measurements: 2.6 x 1.6 x 2 cm. Limited visualization only on transabdominal images. Unable to visualize transvaginally. Normal appearance/no adnexal mass. Pulsed Doppler evaluation of both ovaries demonstrates normal low-resistance arterial and venous waveforms. Other findings No abnormal free fluid. IMPRESSION: Normal ultrasound  appearance of the uterus and ovaries. No evidence of ovarian mass or torsion. Electronically Signed   By: Burman NievesWilliam  Stevens M.D.   On: 05/06/2017 22:37   [place] Art/ven Flow Abd Pelv Doppler  Result Date: 05/06/2017 CLINICAL DATA:  Right lower quadrant pain  today. EXAM: TRANSABDOMINAL AND TRANSVAGINAL ULTRASOUND OF PELVIS DOPPLER ULTRASOUND OF OVARIES TECHNIQUE: Both transabdominal and transvaginal ultrasound examinations of the pelvis were performed. Transabdominal technique was performed for global imaging of the pelvis including uterus, ovaries, adnexal regions, and pelvic cul-de-sac. It was necessary to proceed with endovaginal exam following the transabdominal exam to visualize the uterus and ovaries. Color and duplex Doppler ultrasound was utilized to evaluate blood flow to the ovaries. COMPARISON:  None. FINDINGS: Uterus Measurements: 6.6 x 2.7 x 4.3 cm. No fibroids or other mass visualized. Endometrium Thickness: 7 mm.  No focal abnormality visualized. Right ovary Measurements: 2.8 x 2.5 x 3.4 cm. Normal appearance/no adnexal mass. Left ovary Measurements: 2.6 x 1.6 x 2 cm. Limited visualization only on transabdominal images. Unable to visualize transvaginally. Normal appearance/no adnexal mass. Pulsed Doppler evaluation of both ovaries demonstrates normal low-resistance arterial and venous waveforms. Other findings No abnormal free fluid. IMPRESSION: Normal ultrasound appearance of the uterus and ovaries. No evidence of ovarian mass or torsion. Electronically Signed   By: Burman Nieves M.D.   On: 05/06/2017 22:37   Ct Renal Stone Study  Result Date: 05/07/2017 CLINICAL DATA:  Right lower quadrant pain and right flank pain occurring today after dinner. Clammy and diaphoretic. Nausea and vomiting. Dizziness. EXAM: CT ABDOMEN AND PELVIS WITHOUT CONTRAST TECHNIQUE: Multidetector CT imaging of the abdomen and pelvis was performed following the standard protocol without IV contrast. COMPARISON:  02/20/2017  FINDINGS: Lower chest: The lung bases are clear. Hepatobiliary: No focal liver abnormality is seen. Status post cholecystectomy. No biliary dilatation. Pancreas: Unremarkable. No pancreatic ductal dilatation or surrounding inflammatory changes. Spleen: Normal in size without focal abnormality. Adrenals/Urinary Tract: Adrenal glands are unremarkable. Kidneys are normal, without renal calculi, focal lesion, or hydronephrosis. Bladder is unremarkable. Stomach/Bowel: Postoperative changes consistent with gastric bypass. Appendix appears normal. No evidence of bowel wall thickening, distention, or inflammatory changes. Vascular/Lymphatic: No significant vascular findings are present. No enlarged abdominal or pelvic lymph nodes. Reproductive: Uterus and bilateral adnexa are unremarkable. Other: Minimal periumbilical hernia containing fat. No free air or free fluid in the abdomen. Musculoskeletal: No acute or significant osseous findings. IMPRESSION: No acute process demonstrated in the abdomen or pelvis on noncontrast imaging. No renal or ureteral stone or obstruction. Appendix is normal. Electronically Signed   By: Burman Nieves M.D.   On: 05/07/2017 00:44    Procedures Procedures (including critical care time)  Medications Ordered in ED Medications  sodium chloride 0.9 % bolus 1,000 mL (0 mLs Intravenous Stopped 05/06/17 2324)  morphine 4 MG/ML injection 4 mg (4 mg Intravenous Given 05/06/17 2119)  metroNIDAZOLE (FLAGYL) tablet 500 mg (500 mg Oral Given 05/06/17 2321)  cefTRIAXone (ROCEPHIN) 1 g in dextrose 5 % 50 mL IVPB (0 g Intravenous Stopped 05/07/17 0007)  azithromycin (ZITHROMAX) powder 1 g (1 g Oral Given 05/06/17 2321)  morphine 4 MG/ML injection 4 mg (4 mg Intravenous Given 05/06/17 2313)  promethazine (PHENERGAN) injection 25 mg (25 mg Intravenous Given 05/06/17 2327)  sodium chloride 0.9 % bolus 1,000 mL (0 mLs Intravenous Stopped 05/07/17 0111)  metoprolol tartrate (LOPRESSOR) injection 10 mg (10  mg Intravenous Given 05/07/17 0103)     Initial Impression / Assessment and Plan / ED Course  I have reviewed the triage vital signs and the nursing notes.  Pertinent labs & imaging results that were available during my care of the patient were reviewed by me and considered in my medical decision making (see chart for details).  Virginia [name] is a 41 y.o. female here with RLQ, R adnexal tenderness. Consider torsion vs renal colic, less likely appy. She has R adnexal tenderness on pelvic exam, some discharge. Will empirically treat for PID and will get US transvag, CT renal stone, and labs, UA.   11:41pm US unremarkable. UA + leuk and too many to count WBC. Wet prep + clue cell. Treated for BV, PID with rocephin, flagyl, azithromycin. Still has RLQ pain. Has mild acute renal failure with Cr 1.5, baseline 0.7. Will get CT renal stone. Of note, patient tachycardic to 120, sinus tach. Patient denies SOB or CP. Tachycardia likely from pain, she also was taken off metoprolol by PCP. If CT renal stone unremarkable, anticipate dc home with doxycycline, flagyl, pain meds, phenergan. Will have her restart her metoprolol again. Signed out to Dr. Nicanor Alcon in the ED to follow up CT.    Final Clinical Impressions(s) / ED Diagnoses   Final diagnoses:  Right adnexal tenderness  RLQ abdominal pain  PID (acute pelvic inflammatory disease)  Infective urethritis  Near syncope  BV (bacterial vaginosis)    New Prescriptions New Prescriptions   DOXYCYCLINE (VIBRAMYCIN) 100 MG CAPSULE    Take 1 capsule (100 mg total) by mouth 2 (two) times daily. One po bid x 7 days   HYDROCODONE-ACETAMINOPHEN (NORCO/VICODIN) 5-325 MG TABLET    Take 1 tablet by mouth every 6 (six) hours as needed.   METRONIDAZOLE (FLAGYL) 500 MG TABLET    Take 1 tablet (500 mg total) by mouth 2 (two) times daily. One po bid x 7 days   ONDANSETRON (ZOFRAN ODT) 4 MG DISINTEGRATING TABLET    4mg  ODT q6 hours prn nausea/vomit     Charlynne Pander, MD 05/06/17 2341    Charlynne Pander, MD 05/07/17 [phone]

## 2017-05-07 ENCOUNTER — Emergency Department (HOSPITAL_COMMUNITY): Payer: Commercial Managed Care - PPO

## 2017-05-07 MED ORDER — METOPROLOL TARTRATE 25 MG PO TABS: 50.0000 mg | ORAL_TABLET | ORAL | 0 refills | Status: DC

## 2017-05-07 MED ORDER — METRONIDAZOLE 500 MG PO TABS: 500.0000 mg | ORAL_TABLET | Freq: Two times a day (BID) | ORAL | 0 refills | Status: DC

## 2017-05-07 NOTE — ED Notes (Signed)
Patient transported to CT 

## 2017-05-09 LAB — URINE CULTURE: Culture: >=100000 — AB

## 2017-05-10 ENCOUNTER — Telehealth: Payer: Self-pay | Admitting: Emergency Medicine

## 2017-05-10 LAB — GC/CHLAMYDIA PROBE AMP (~~LOC~~) NOT AT ARMC
CHLAMYDIA, DNA PROBE: NEGATIVE
NEISSERIA GONORRHEA: NEGATIVE

## 2017-05-10 NOTE — Telephone Encounter (Signed)
Post ED Visit - Positive Culture Follow-up  Culture report reviewed by antimicrobial stewardship pharmacist:  []  Enzo BiNathan Batchelder, Pharm.D. [x]  Celedonio MiyamotoJeremy Frens, [address]Ph[address] BCPS AQ-ID []  Garvin FilaMike Maccia, Pharm.D., BCPS []  Georgina PillionElizabeth Martin, Pharm.D., BCPS []  PalmettoMinh Pham, [address]Ph[address]., BCPS, AAHIVP []  Estella HuskMichelle Turner, Pharm.D., BCPS, AAHIVP []  Lysle Pearlachel Rumbarger, PharmD, BCPS []  Casilda Carlsaylor Stone, PharmD, BCPS []  Pollyann SamplesAndy Johnston, PharmD, BCPS  Positive urine culture Treated with doxycycline, metronidazole, organism sensitive to the same and no further patient follow-up is required at this time.  Berle MullMiller, Marissa Weaver 05/10/2017, 10:19 AM

## 2017-05-16 ENCOUNTER — Emergency Department (HOSPITAL_COMMUNITY)
Admission: EM | Admit: 2017-05-16 | Discharge: 2017-05-17 | Disposition: A | Discharge status: Home / Self Care | Payer: Commercial Managed Care - PPO | Attending: Emergency Medicine | Admitting: Emergency Medicine

## 2017-05-16 ENCOUNTER — Encounter (HOSPITAL_COMMUNITY): Payer: Self-pay | Admitting: Emergency Medicine

## 2017-05-16 DIAGNOSIS — R109 Unspecified abdominal pain: Secondary | ICD-10-CM

## 2017-05-16 DIAGNOSIS — R112 Nausea with vomiting, unspecified: Secondary | ICD-10-CM | POA: Diagnosis not present

## 2017-05-16 DIAGNOSIS — Z794 Long term (current) use of insulin: Secondary | ICD-10-CM | POA: Diagnosis not present

## 2017-05-16 DIAGNOSIS — I10 Essential (primary) hypertension: Secondary | ICD-10-CM | POA: Insufficient documentation

## 2017-05-16 DIAGNOSIS — R197 Diarrhea, unspecified: Secondary | ICD-10-CM | POA: Insufficient documentation

## 2017-05-16 DIAGNOSIS — G8929 Other chronic pain: Secondary | ICD-10-CM | POA: Diagnosis present

## 2017-05-16 DIAGNOSIS — R1031 Right lower quadrant pain: Secondary | ICD-10-CM | POA: Insufficient documentation

## 2017-05-16 DIAGNOSIS — Z87891 Personal history of nicotine dependence: Secondary | ICD-10-CM | POA: Diagnosis not present

## 2017-05-16 DIAGNOSIS — Z79899 Other long term (current) drug therapy: Secondary | ICD-10-CM | POA: Insufficient documentation

## 2017-05-16 DIAGNOSIS — E119 Type 2 diabetes mellitus without complications: Secondary | ICD-10-CM | POA: Insufficient documentation

## 2017-05-16 LAB — COMPREHENSIVE METABOLIC PANEL
ALBUMIN: 3.6 g/dL (ref 3.5–5.0)
ALT: 21 U/L (ref 14–54)
AST: 26 U/L (ref 15–41)
Alkaline Phosphatase: 81 U/L (ref 38–126)
Anion gap: 12 (ref 5–15)
BUN: 19 mg/dL (ref 6–20)
CHLORIDE: 107 mmol/L (ref 101–111)
CO2: 20 mmol/L — AB (ref 22–32)
CREATININE: 1.34 mg/dL — AB (ref 0.44–1.00)
Calcium: 8.9 mg/dL (ref 8.9–10.3)
GFR calc Af Amer: 57 mL/min — ABNORMAL LOW (ref >60)
GFR, EST NON AFRICAN AMERICAN: 49 mL/min — AB (ref >60)
Glucose, Bld: 226 mg/dL — ABNORMAL HIGH (ref 65–99)
Potassium: 3.9 mmol/L (ref 3.5–5.1)
SODIUM: 139 mmol/L (ref 135–145)
Total Bilirubin: 0.4 mg/dL (ref 0.3–1.2)
Total Protein: 8 g/dL (ref 6.5–8.1)

## 2017-05-16 LAB — URINALYSIS, ROUTINE W REFLEX MICROSCOPIC
BACTERIA UA: NONE SEEN
BILIRUBIN URINE: NEGATIVE
Glucose, UA: NEGATIVE mg/dL
HGB URINE DIPSTICK: NEGATIVE
Ketones, ur: 5 mg/dL — AB
Nitrite: NEGATIVE
PROTEIN: NEGATIVE mg/dL
Specific Gravity, Urine: 1.017 (ref 1.005–1.030)
pH: 5 (ref 5.0–8.0)

## 2017-05-16 LAB — CBC
HEMATOCRIT: 37.5 % (ref 36.0–46.0)
Hemoglobin: 12.1 g/dL (ref 12.0–15.0)
MCH: 28.7 pg (ref 26.0–34.0)
MCHC: 32.3 g/dL (ref 30.0–36.0)
MCV: 89.1 fL (ref 78.0–100.0)
PLATELETS: 278 10*3/uL (ref 150–400)
RBC: 4.21 MIL/uL (ref 3.87–5.11)
RDW: 16 % — AB (ref 11.5–15.5)
WBC: 12.4 10*3/uL — AB (ref 4.0–10.5)

## 2017-05-16 LAB — LIPASE, BLOOD: LIPASE: 17 U/L (ref 11–51)

## 2017-05-16 MED ORDER — PROMETHAZINE HCL 25 MG/ML IJ SOLN
25.0000 mg | Freq: Once | INTRAMUSCULAR | Status: AC
  Administered 2017-05-16: 25 mg via INTRAVENOUS
  Filled 2017-05-16: qty 1

## 2017-05-16 MED ORDER — PROMETHAZINE HCL 25 MG PO TABS: 25.0000 mg | ORAL_TABLET | Freq: Four times a day (QID) | ORAL | 0 refills | Status: DC | PRN

## 2017-05-16 MED ORDER — HYDROMORPHONE HCL 1 MG/ML IJ SOLN
1.0000 mg | Freq: Once | INTRAMUSCULAR | Status: AC
  Administered 2017-05-16: 1 mg via INTRAVENOUS
  Filled 2017-05-16: qty 1

## 2017-05-16 MED ORDER — SODIUM CHLORIDE 0.9 % IV BOLUS (SEPSIS)
1000.0000 mL | Freq: Once | INTRAVENOUS | Status: AC
  Administered 2017-05-16: 1000 mL via INTRAVENOUS

## 2017-05-16 MED ORDER — MORPHINE SULFATE (PF) 2 MG/ML IV SOLN
4.0000 mg | Freq: Once | INTRAVENOUS | Status: AC
  Administered 2017-05-16: 4 mg via INTRAVENOUS
  Filled 2017-05-16: qty 2

## 2017-05-16 NOTE — ED Notes (Signed)
Bed: [id number] Expected date:  Expected time:  Means of arrival:  Comments: PCP diarrhea/dehydration DOB 04/16/1976

## 2017-05-16 NOTE — ED Provider Notes (Signed)
UA looks improved.  Continue Cipro.  DC with phenergan.  Tolerating fluids in ED.  Patient notified of plan to discharge pending improved urine by ChadWest, PA-C.  I did not personally see this patient.     Roxy HorsemanBrowning, Champayne Kocian, PA-C 05/16/17 2342    Little, Ambrose Finlandachel Morgan, MD 05/18/17 [phone]

## 2017-05-16 NOTE — ED Notes (Signed)
Bed: [facility] Expected date:  Expected time:  Means of arrival:  Comments: 

## 2017-05-16 NOTE — ED Notes (Signed)
ICE CHIPS AND GINGER -ALE GIVEN 

## 2017-05-16 NOTE — ED Triage Notes (Signed)
Per pt, states N/V/D on and off for 2 weeks-states she was hospitalized last week for UTI-states she started having lower abdominal pain yesterday and increase N/V/D-states they increased her Metoprolol and she is currently on antibiotics

## 2017-05-16 NOTE — ED Provider Notes (Signed)
[facility]-EMERGENCY DEPT Provider Note   CSN: [id number] Arrival date & time: 05/16/17  1734     History   Chief Complaint Chief Complaint  Patient presents with  . Emesis    HPI Virginia [name] is a 41 y.o. female.  HPI   Pt with hx DM, HTN, chronic lower abdominal pain, recurrent UTI p/w diarrhea x 2 weeks, vomiting x 2 days, lower abdominal pain that began again today.  Is being evaluated for same by PCP - stool samples are pending.  Pt sent to ED today out of concern for dehydration.  States she has 4-5 stools/day and is vomiting every 5 minutes.  Emesis is clear.  Taking zofran at home without improvement.  Has recurrent UTIs, is currently being treated by her PCP for UTI with cipro.  Denies fevers.    Past Medical History:  Diagnosis Date  . Anxiety   . Arthritis   . Bipolar affective (HCC)   . Depression   . Diabetes mellitus   . Hypertension     Patient Active Problem List   Diagnosis Date Noted  . Urinary tract infection without hematuria   . Dehydration   . Gastroesophageal reflux disease   . Class 2 obesity due to excess calories with body mass index (BMI) of 39.0 to 39.9 in adult   . Hypokalemia   . Hypomagnesemia   . Anxiety 02/20/2017  . Diabetes mellitus without complication (HCC) 02/20/2017  . Sepsis (HCC) 02/20/2017  . Abdominal pain 02/20/2017  . Nausea & vomiting 02/20/2017  . Tobacco abuse 02/20/2017  . Flank pain 02/20/2017  . Hypertension   . Lactic acidosis     Past Surgical History:  Procedure Laterality Date  . CESAREAN SECTION    . CHOLECYSTECTOMY    . GASTRIC BYPASS    . KNEE SURGERY      OB History    No data available       Home Medications    Prior to Admission medications   Medication Sig Start Date End Date Taking? Authorizing Provider  ALPRAZolam Prudy Feeler) 0.5 MG tablet Take 0.5 mg by mouth daily as needed for anxiety.  12/22/16  Yes [provider]  doxycycline (VIBRAMYCIN) 100 MG capsule Take 1 capsule (100 mg  total) by mouth 2 (two) times daily. One po bid x 7 days 05/06/17  Yes Charlynne Pander, MD  DULoxetine (CYMBALTA) 30 MG capsule Take 30 mg by mouth 2 (two) times daily. 03/18/17  Yes [provider]  esomeprazole (NEXIUM) 20 MG capsule Take 20 mg by mouth 2 (two) times daily before a meal.   Yes [provider]  HYDROcodone-acetaminophen (NORCO/VICODIN) 5-325 MG tablet Take 1 tablet by mouth every 6 (six) hours as needed. 05/06/17  Yes Charlynne Pander, MD  insulin glargine (LANTUS) 100 UNIT/ML injection Inject 18 Units into the skin at bedtime.    Yes [provider]  metoprolol succinate (TOPROL-XL) 50 MG 24 hr tablet Take 75 mg by mouth daily. 05/13/17  Yes [provider]  metroNIDAZOLE (FLAGYL) 500 MG tablet Take 1 tablet (500 mg total) by mouth 2 (two) times daily. One po bid x 7 days 05/07/17  Yes Charlynne Pander, MD  multivitamin-iron-minerals-folic acid (CENTRUM) chewable tablet Chew 1 tablet by mouth daily. 02/22/17  Yes Vassie Loll, MD  ondansetron (ZOFRAN ODT) 4 MG disintegrating tablet 4mg  ODT q6 hours prn nausea/vomit 05/06/17  Yes Charlynne Pander, MD  vitamin B-12 (CYANOCOBALAMIN) 1000 MCG tablet Take 1 tablet (1,000  mcg total) by mouth daily. 02/22/17  Yes Vassie Loll, MD  metoCLOPramide (REGLAN) 10 MG tablet Take 1 tablet (10 mg total) by mouth every 6 (six) hours as needed for refractory nausea / vomiting. Patient not taking: Reported on 05/16/2017 02/22/17   Vassie Loll, MD  metoprolol tartrate (LOPRESSOR) 25 MG tablet Take 2 tablets (50 mg total) by mouth every morning. Patient not taking: Reported on 05/16/2017 05/07/17   Charlynne Pander, MD  nicotine (NICODERM CQ - DOSED IN MG/24 HOURS) 21 mg/24hr patch Place 1 patch (21 mg total) onto the skin daily. Patient not taking: Reported on 05/16/2017 02/22/17   Vassie Loll, MD  pantoprazole (PROTONIX) 40 MG tablet Take 1 tablet (40 mg total) by mouth 2 (two) times daily. Patient not taking:  Reported on 05/16/2017 02/22/17   Vassie Loll, MD  promethazine (PHENERGAN) 25 MG tablet Take 1 tablet (25 mg total) by mouth every 6 (six) hours as needed for nausea. 05/16/17   Trixie Dredge, PA-C    Family History Family History  Problem Relation Age of Onset  . Asthma Mother   . Hypertension Mother   . Hypertension Father   . Diabetes Mellitus II Father     Social History Social History  Substance Use Topics  . Smoking status: Former Smoker    Packs/day: 0.50    Types: Cigarettes    Quit date: 02/20/2013  . Smokeless tobacco: Never Used  . Alcohol use Yes     Comment: occasional     Allergies   Ace inhibitors   Review of Systems Review of Systems  All other systems reviewed and are negative.    Physical Exam Updated Vital Signs BP (!) 154/85 (BP Location: Right Arm)   Pulse 94   Temp 97.9 F (36.6 C) (Oral)   Resp 19   Ht 5\' 2"  (1.575 m)   Wt 99.8 kg (220 lb)   LMP 04/21/2017   SpO2 100%   BMI 40.24 kg/m   Physical Exam  Constitutional: She appears well-developed and well-nourished. No distress.  HENT:  Head: Normocephalic and atraumatic.  Neck: Neck supple.  Cardiovascular: Normal rate and regular rhythm.   Pulmonary/Chest: Effort normal and breath sounds normal. No respiratory distress. She has no wheezes. She has no rales.  Abdominal: Soft. She exhibits no distension. There is tenderness in the right lower quadrant and suprapubic area. There is no rebound and no guarding.  Neurological: She is alert.  Skin: She is not diaphoretic.  Nursing note and vitals reviewed.    ED Treatments / Results  Labs (all labs ordered are listed, but only abnormal results are displayed) Labs Reviewed  COMPREHENSIVE METABOLIC PANEL - Abnormal; Notable for the following:       Result Value   CO2 20 (*)    Glucose, Bld 226 (*)    Creatinine, Ser 1.34 (*)    GFR calc non Af Amer 49 (*)    GFR calc Af Amer 57 (*)    All other components within normal limits  CBC -  Abnormal; Notable for the following:    WBC 12.4 (*)    RDW 16.0 (*)    All other components within normal limits  LIPASE, BLOOD  URINALYSIS, ROUTINE W REFLEX MICROSCOPIC    EKG  EKG Interpretation None       Radiology No results found.  Procedures Procedures (including critical care time)  Medications Ordered in ED Medications  sodium chloride 0.9 % bolus 1,000 mL (0 mLs Intravenous  Stopped 05/16/17 2315)  promethazine (PHENERGAN) injection 25 mg (25 mg Intravenous Given 05/16/17 2117)  morphine 2 MG/ML injection 4 mg (4 mg Intravenous Given 05/16/17 2117)  sodium chloride 0.9 % bolus 1,000 mL (0 mLs Intravenous Stopped 05/16/17 2316)  HYDROmorphone (DILAUDID) injection 1 mg (1 mg Intravenous Given 05/16/17 2316)     Initial Impression / Assessment and Plan / ED Course  I have reviewed the triage vital signs and the nursing notes.  Pertinent labs & imaging results that were available during my care of the patient were reviewed by me and considered in my medical decision making (see chart for details).  Clinical Course as of May 16 2325  Mon May 16, 2017  2209 Pt reports symptom improvement.  Goal is for UA, PO trial, d/c home with phenergan, PCP follow up.   [EW]    Clinical Course User Index [EW] ChadWest, Suleika Donavan, [place]PA-C    Afebrile nontoxic patient with chronic intermittent lower abdominal pain x years.  Diarrhea x 2 weeks, vomiting x 2 days.  Increased pain today.  Being investigated by her PCP.  Currently being treated for UTI with cipro.  Pt treated with IVF hydration, phenergan, narcotic pain medication with improvement.  Pt tolerating PO.  Plan is for UA, d/c home.  Pt signed out to [employer] Energyob Browning, PA-C, at change of shift pending UA.    Final Clinical Impressions(s) / ED Diagnoses   Final diagnoses:  Nausea vomiting and diarrhea  Chronic abdominal pain    New Prescriptions New Prescriptions   PROMETHAZINE (PHENERGAN) 25 MG TABLET    Take 1 tablet (25 mg total) by mouth  every 6 (six) hours as needed for nausea.     Trixie DredgeWest, Jabreel Chimento, PA-C 05/16/17 2326    Little, Ambrose Finlandachel Morgan, MD 05/18/17 [phone]

## 2017-05-16 NOTE — Discharge Instructions (Signed)
Read the information below.  Use the prescribed medication as directed.  Please discuss all new medications with your pharmacist.  You may return to the Emergency Department at any time for worsening condition or any new symptoms that concern you.   If you develop high fevers, worsening abdominal pain, uncontrolled vomiting, or are unable to tolerate fluids by mouth, return to the ER for a recheck.  ° °

## 2017-05-23 ENCOUNTER — Other Ambulatory Visit: Payer: Self-pay | Admitting: Internal Medicine

## 2017-05-23 DIAGNOSIS — R109 Unspecified abdominal pain: Secondary | ICD-10-CM

## 2017-05-25 ENCOUNTER — Ambulatory Visit
Admission: RE | Admit: 2017-05-25 | Discharge: 2017-05-25 | Disposition: A | Discharge status: Home / Self Care | Payer: Commercial Managed Care - PPO | Source: Ambulatory Visit | Attending: Internal Medicine | Admitting: Internal Medicine

## 2017-05-25 DIAGNOSIS — R109 Unspecified abdominal pain: Secondary | ICD-10-CM

## 2017-05-25 MED ORDER — IOPAMIDOL (ISOVUE-300) INJECTION 61%
100.0000 mL | Freq: Once | INTRAVENOUS | Status: AC | PRN
  Administered 2017-05-25: 125 mL via INTRAVENOUS

## 2017-05-27 ENCOUNTER — Emergency Department (HOSPITAL_COMMUNITY)
Admission: EM | Admit: 2017-05-27 | Discharge: 2017-05-27 | Disposition: A | Discharge status: Home / Self Care | Payer: Commercial Managed Care - PPO | Attending: Emergency Medicine | Admitting: Emergency Medicine

## 2017-05-27 ENCOUNTER — Emergency Department (HOSPITAL_COMMUNITY): Payer: Commercial Managed Care - PPO

## 2017-05-27 ENCOUNTER — Encounter (HOSPITAL_COMMUNITY): Payer: Self-pay

## 2017-05-27 DIAGNOSIS — Z87891 Personal history of nicotine dependence: Secondary | ICD-10-CM | POA: Insufficient documentation

## 2017-05-27 DIAGNOSIS — E119 Type 2 diabetes mellitus without complications: Secondary | ICD-10-CM | POA: Insufficient documentation

## 2017-05-27 DIAGNOSIS — R1011 Right upper quadrant pain: Secondary | ICD-10-CM | POA: Insufficient documentation

## 2017-05-27 DIAGNOSIS — Z794 Long term (current) use of insulin: Secondary | ICD-10-CM | POA: Diagnosis not present

## 2017-05-27 DIAGNOSIS — I10 Essential (primary) hypertension: Secondary | ICD-10-CM | POA: Insufficient documentation

## 2017-05-27 DIAGNOSIS — G8929 Other chronic pain: Secondary | ICD-10-CM | POA: Diagnosis not present

## 2017-05-27 DIAGNOSIS — R197 Diarrhea, unspecified: Secondary | ICD-10-CM | POA: Diagnosis not present

## 2017-05-27 DIAGNOSIS — Z79899 Other long term (current) drug therapy: Secondary | ICD-10-CM | POA: Insufficient documentation

## 2017-05-27 DIAGNOSIS — R112 Nausea with vomiting, unspecified: Secondary | ICD-10-CM | POA: Diagnosis present

## 2017-05-27 DIAGNOSIS — R1031 Right lower quadrant pain: Secondary | ICD-10-CM

## 2017-05-27 LAB — C DIFFICILE QUICK SCREEN W PCR REFLEX
C DIFFICILE (CDIFF) INTERP: NOT DETECTED
C DIFFICILE (CDIFF) TOXIN: NEGATIVE
C DIFFICLE (CDIFF) ANTIGEN: NEGATIVE

## 2017-05-27 LAB — COMPREHENSIVE METABOLIC PANEL
ALBUMIN: 3.4 g/dL — AB (ref 3.5–5.0)
ALK PHOS: 59 U/L (ref 38–126)
ALT: 18 U/L (ref 14–54)
ANION GAP: 14 (ref 5–15)
AST: 28 U/L (ref 15–41)
BILIRUBIN TOTAL: 0.6 mg/dL (ref 0.3–1.2)
BUN: 12 mg/dL (ref 6–20)
CALCIUM: 8.7 mg/dL — AB (ref 8.9–10.3)
CO2: 24 mmol/L (ref 22–32)
CREATININE: 0.89 mg/dL (ref 0.44–1.00)
Chloride: 102 mmol/L (ref 101–111)
GFR calc Af Amer: >60 mL/min (ref >60)
GFR calc non Af Amer: >60 mL/min (ref >60)
GLUCOSE: 238 mg/dL — AB (ref 65–99)
Potassium: 4.1 mmol/L (ref 3.5–5.1)
Sodium: 140 mmol/L (ref 135–145)
TOTAL PROTEIN: 7.3 g/dL (ref 6.5–8.1)

## 2017-05-27 LAB — URINALYSIS, ROUTINE W REFLEX MICROSCOPIC
Bilirubin Urine: NEGATIVE
GLUCOSE, UA: 50 mg/dL — AB
Hgb urine dipstick: NEGATIVE
Ketones, ur: 5 mg/dL — AB
Leukocytes, UA: NEGATIVE
Nitrite: NEGATIVE
PH: 5 (ref 5.0–8.0)
Protein, ur: 100 mg/dL — AB
Specific Gravity, Urine: 1.023 (ref 1.005–1.030)

## 2017-05-27 LAB — CBC
HCT: 32.6 % — ABNORMAL LOW (ref 36.0–46.0)
Hemoglobin: 10.9 g/dL — ABNORMAL LOW (ref 12.0–15.0)
MCH: 28.4 pg (ref 26.0–34.0)
MCHC: 33.4 g/dL (ref 30.0–36.0)
MCV: 84.9 fL (ref 78.0–100.0)
PLATELETS: 377 10*3/uL (ref 150–400)
RBC: 3.84 MIL/uL — ABNORMAL LOW (ref 3.87–5.11)
RDW: 15.8 % — AB (ref 11.5–15.5)
WBC: 14.2 10*3/uL — ABNORMAL HIGH (ref 4.0–10.5)

## 2017-05-27 LAB — LIPASE, BLOOD: Lipase: 17 U/L (ref 11–51)

## 2017-05-27 LAB — CBG MONITORING, ED: Glucose-Capillary: 205 mg/dL — ABNORMAL HIGH (ref 65–99)

## 2017-05-27 MED ORDER — PROMETHAZINE HCL 25 MG/ML IJ SOLN
25.0000 mg | Freq: Once | INTRAMUSCULAR | Status: AC
  Administered 2017-05-27: 25 mg via INTRAVENOUS
  Filled 2017-05-27: qty 1

## 2017-05-27 MED ORDER — GI COCKTAIL ~~LOC~~
30.0000 mL | Freq: Once | ORAL | Status: AC
  Administered 2017-05-27: 30 mL via ORAL
  Filled 2017-05-27: qty 30

## 2017-05-27 MED ORDER — ONDANSETRON HCL 4 MG/2ML IJ SOLN: 4.0000 mg | Freq: Once | INTRAMUSCULAR | Status: DC

## 2017-05-27 MED ORDER — HYDROMORPHONE HCL 1 MG/ML IJ SOLN
1.0000 mg | Freq: Once | INTRAMUSCULAR | Status: AC
  Administered 2017-05-27: 1 mg via INTRAVENOUS
  Filled 2017-05-27: qty 1

## 2017-05-27 MED ORDER — PROMETHAZINE HCL 25 MG/ML IJ SOLN: 12.5000 mg | Freq: Once | INTRAMUSCULAR | Status: DC

## 2017-05-27 MED ORDER — PROMETHAZINE HCL 25 MG PO TABS
25.0000 mg | ORAL_TABLET | Freq: Once | ORAL | Status: AC
  Administered 2017-05-27: 25 mg via ORAL
  Filled 2017-05-27: qty 1

## 2017-05-27 MED ORDER — SODIUM CHLORIDE 0.9 % IV BOLUS (SEPSIS)
1000.0000 mL | Freq: Once | INTRAVENOUS | Status: AC
  Administered 2017-05-27: 1000 mL via INTRAVENOUS

## 2017-05-27 NOTE — ED Notes (Signed)
IV team at bedside 

## 2017-05-27 NOTE — ED Notes (Addendum)
Pt obtained urine and stool sample but it was flushed. Pt aware need another sample. Both sample still need to be obtained. Pt verbalizes nausea and pain worse; rating pain 7/10; PA notified.

## 2017-05-27 NOTE — ED Notes (Signed)
Two unsuccessful IV attempts, L AC and L hand.

## 2017-05-27 NOTE — ED Provider Notes (Signed)
[facility] Provider Note   CSN: [id number] Arrival date & time: 05/27/17  1312     History   Chief Complaint Chief Complaint  Patient presents with  . Abdominal Pain  . Nausea    HPI Virginia [name] is a 41 y.o. female with a h/o of recurrent UTI and bariatric surgery who presents to the emergency department N/V/D, She reports that she began feeling nauseated last night followed numerous episodes of emesis and watery diarrhea. She reports that she has been able unable to keep any fluids or food down without vomiting since last night. She reports that she was seen by her PCP on 6/14 after having a CT abdomen pelvis with contrast on 6/13, which was unremarkable. Her PCP was concerned that the symptoms could possibly be due to C. difficile given recent antibiotics. She is currently finishing up a course of ciprofloxacin, which she has 3 days left, for a urinary tract infection. She reports a history of right lower quadrant abdominal pain, but reports today's pain is also present in the right upper abdominal area and epigastric area. She describes the pain as sharp with intermittent burning. Surgical history includes a cholecystectomy and bariatric surgery. She reports alcohol use 5 nights per week and states that she feels that her diarrhea is worse after alcohol use. Denies marijuana use. No sick contacts or family members with similar symptoms.  She reports that she moved to [place] from [place] in February. She has not been established with GI since she has relocated. She was previously followed by GI and had had an endoscopy and colonoscopy performed within the last 2 years. She reports no other findings from the endoscopy other than a "piece that was left over from her bariatric surgery" that was removed. She is uncertain of the findings from her colonoscopy.   The history is provided by the patient. No language interpreter was used.    Past Medical History:  Diagnosis  Date  . Anxiety   . Arthritis   . Bipolar affective (HCC)   . Depression   . Diabetes mellitus   . Hypertension     Patient Active Problem List   Diagnosis Date Noted  . Urinary tract infection without hematuria   . Dehydration   . Gastroesophageal reflux disease   . Class 2 obesity due to excess calories with body mass index (BMI) of 39.0 to 39.9 in adult   . Hypokalemia   . Hypomagnesemia   . Anxiety 02/20/2017  . Diabetes mellitus without complication (HCC) 02/20/2017  . Sepsis (HCC) 02/20/2017  . Abdominal pain 02/20/2017  . Nausea & vomiting 02/20/2017  . Tobacco abuse 02/20/2017  . Flank pain 02/20/2017  . Hypertension   . Lactic acidosis     Past Surgical History:  Procedure Laterality Date  . CESAREAN SECTION    . CHOLECYSTECTOMY    . GASTRIC BYPASS    . KNEE SURGERY      OB History    No data available       Home Medications    Prior to Admission medications   Medication Sig Start Date End Date Taking? Authorizing Provider  ALPRAZolam Prudy Feeler) 0.5 MG tablet Take 0.5 mg by mouth daily as needed for anxiety.  12/22/16  Yes [provider]  DULoxetine (CYMBALTA) 30 MG capsule Take 30 mg by mouth 2 (two) times daily. 03/18/17  Yes [provider]  esomeprazole (NEXIUM) 20 MG capsule Take 20 mg by mouth 2 (two) times daily before  a meal.   Yes [provider]  insulin glargine (LANTUS) 100 UNIT/ML injection Inject 18 Units into the skin at bedtime.    Yes [provider]  metoprolol succinate (TOPROL-XL) 50 MG 24 hr tablet Take 75 mg by mouth daily. 05/13/17  Yes [provider]  multivitamin-iron-minerals-folic acid (CENTRUM) chewable tablet Chew 1 tablet by mouth daily. 02/22/17  Yes Vassie Loll, MD  promethazine (PHENERGAN) 25 MG tablet Take 1 tablet (25 mg total) by mouth every 6 (six) hours as needed for nausea. 05/16/17  Yes West, Emily, PA-C  vitamin B-12 (CYANOCOBALAMIN) 1000 MCG tablet Take 1 tablet (1,000 mcg  total) by mouth daily. 02/22/17  Yes Vassie Loll, MD    Family History Family History  Problem Relation Age of Onset  . Asthma Mother   . Hypertension Mother   . Hypertension Father   . Diabetes Mellitus II Father     Social History Social History  Substance Use Topics  . Smoking status: Former Smoker    Packs/day: 0.50    Types: Cigarettes    Quit date: 02/20/2013  . Smokeless tobacco: Never Used  . Alcohol use Yes     Comment: occasional     Allergies   Ace inhibitors   Review of Systems Review of Systems  Constitutional: Negative for activity change, chills and fever.  Respiratory: Negative for shortness of breath.   Cardiovascular: Negative for chest pain.  Gastrointestinal: Positive for abdominal pain, diarrhea, nausea and vomiting. Negative for abdominal distention, anal bleeding, blood in stool and constipation.  Musculoskeletal: Negative for back pain.  Skin: Negative for rash.   Physical Exam Updated Vital Signs BP 122/86   Pulse 91   Temp 99.4 F (37.4 C) (Oral)   Resp 18   SpO2 99%   Physical Exam  Constitutional: No distress.  HENT:  Head: Normocephalic.  Mucous membranes appear dry.  Eyes: Conjunctivae are normal.  Neck: Neck supple.  Cardiovascular: Normal rate, regular rhythm, normal heart sounds and intact distal pulses.  Exam reveals no gallop and no friction rub.   No murmur heard. Pulmonary/Chest: Effort normal and breath sounds normal. No respiratory distress. She has no wheezes. She has no rales. She exhibits no tenderness.  Abdominal: Soft. She exhibits no distension. There is tenderness. There is no rebound and no guarding.  TTP in the RLQ and RUQ. No LLQ pain. No rebound or guarding.   Musculoskeletal: Normal range of motion. She exhibits no edema or tenderness.  Neurological: She is alert.  Skin: Skin is warm. Capillary refill takes 2 to 3 seconds. No rash noted.  Psychiatric: Her behavior is normal.  Nursing note and vitals  reviewed.  ED Treatments / Results  Labs (all labs ordered are listed, but only abnormal results are displayed) Labs Reviewed  COMPREHENSIVE METABOLIC PANEL - Abnormal; Notable for the following:       Result Value   Glucose, Bld 238 (*)    Calcium 8.7 (*)    Albumin 3.4 (*)    All other components within normal limits  CBC - Abnormal; Notable for the following:    WBC 14.2 (*)    RBC 3.84 (*)    Hemoglobin 10.9 (*)    HCT 32.6 (*)    RDW 15.8 (*)    All other components within normal limits  URINALYSIS, ROUTINE W REFLEX MICROSCOPIC - Abnormal; Notable for the following:    APPearance HAZY (*)    Glucose, UA 50 (*)    Ketones,  ur 5 (*)    Protein, ur 100 (*)    Bacteria, UA RARE (*)    Squamous Epithelial / LPF 6-30 (*)    All other components within normal limits  CBG MONITORING, ED - Abnormal; Notable for the following:    Glucose-Capillary 205 (*)    All other components within normal limits  C DIFFICILE QUICK SCREEN W PCR REFLEX  GASTROINTESTINAL PANEL BY PCR, STOOL (REPLACES STOOL CULTURE)  LIPASE, BLOOD  H. PYLORI ANTIBODY, IGG  CBG MONITORING, ED    EKG  EKG Interpretation None       Radiology US Abdomen Limited Ruq  Result Date: 05/27/2017 CLINICAL DATA:  Right-sided abdominal pain. EXAM: ULTRASOUND ABDOMEN LIMITED RIGHT UPPER QUADRANT COMPARISON:  CT of the abdomen pelvis 06/11/2017 FINDINGS: Study is limited due to patient's body habitus. Gallbladder is surgically absent. Common bile duct: Diameter: 6.3 cm mm proximal and 9.1 mm distal. Liver: No focal lesion identified. Within normal limits in parenchymal echogenicity. IMPRESSION: Post cholecystectomy. Mild dilation of the extrahepatic common bile the, which may be within normal limits for post cholecystectomy state. Electronically Signed   By: Ted Mcalpine M.D.   On: 05/27/2017 22:07    Procedures Procedures (including critical care time)  Medications Ordered in ED Medications  sodium  chloride 0.9 % bolus 1,000 mL (0 mLs Intravenous Stopped 05/27/17 1725)  HYDROmorphone (DILAUDID) injection 1 mg (1 mg Intravenous Given 05/27/17 1551)  promethazine (PHENERGAN) injection 25 mg (25 mg Intravenous Given 05/27/17 1551)  HYDROmorphone (DILAUDID) injection 1 mg (1 mg Intravenous Given 05/27/17 1903)  promethazine (PHENERGAN) injection 25 mg (25 mg Intravenous Given 05/27/17 1903)  sodium chloride 0.9 % bolus 1,000 mL (0 mLs Intravenous Stopped 05/27/17 2157)  gi cocktail (Maalox,Lidocaine,Donnatal) (30 mLs Oral Given 05/27/17 2323)  promethazine (PHENERGAN) tablet 25 mg (25 mg Oral Given 05/27/17 2323)     Initial Impression / Assessment and Plan / ED Course  I have reviewed the triage vital signs and the nursing notes.  Pertinent labs & imaging results that were available during my care of the patient were reviewed by me and considered in my medical decision making (see chart for details).     Patient is nontoxic, non-septic appearing, in no apparent distress. Discussed the patient with Dr. Denton Lank, attending physician. Leukocytosis to 14.2, possible secondary to dehydration. Afebrile in the ED. VSS. NAD after N/V was managed. Patient's pain and other symptoms adequately managed in emergency department.  Fluid bolus given; capillary refill improved.  Labs, recent imaging and vitals reviewed.  Patient does not meet the SIRS or Sepsis criteria.  On repeat exam, patient does not have a surgical abdomen and there are no peritoneal signs, but does express RUQ pain, which is new for her. RUQ US unremarkable. No indication of appendicitis, bowel obstruction, bowel perforation, cholecystitis, or diverticulitis.  C. Diff is negative. Stool panel and H. Pylori are pending. Discussed the plan with the patient, which included discharging to home with PCP follow up and a referral to Urology for recurrent UTI. Discussed with the lab will call her if the GI panel are H pylori test are positive. Patient was  successfully Fluid challenged. Also counseled on avoiding alcohol, which can cause gastritis.  Patient discharged home with symptomatic treatment and given strict instructions for follow-up with their primary care physician.  I have also discussed reasons to return immediately to the ER.  Patient expresses understanding and agrees with plan.  Final Clinical Impressions(s) / ED Diagnoses  Final diagnoses:  RUQ abdominal pain  Abdominal pain, chronic, right lower quadrant  Nausea vomiting and diarrhea    New Prescriptions Discharge Medication List as of 05/27/2017 11:20 PM       Frederik PearMcDonald, Tyesha Joffe A, PA-C 05/28/17 [id number]    Cathren LaineSteinl, Kevin, MD 05/28/17 1507

## 2017-05-27 NOTE — ED Notes (Signed)
No active vomiting noted at present time.

## 2017-05-27 NOTE — ED Notes (Signed)
Pt was given ginger ale for po/fluid challenge. 

## 2017-05-27 NOTE — ED Triage Notes (Addendum)
Pt c/o centralized upper and RLQ abd pain accompanied by N/V/D since last night. Hx of same. Pt dry heaving in triage. A&Ox4. Ambulatory.

## 2017-05-27 NOTE — ED Notes (Signed)
Pt was advised of the need for stool and urine specimen.

## 2017-05-27 NOTE — ED Notes (Signed)
Being sent here by PCP for abdominal pain, N/V-states PCP will fax information on his work up-states patient might need admission

## 2017-05-27 NOTE — ED Notes (Signed)
Ultrasound in process at bedside at this time. 

## 2017-05-27 NOTE — Discharge Instructions (Addendum)
Please continue to take the remainder of your course of ciprofloxacin for your urinary tract infection. Please follow-up with your primary care provider after today's visit. You can call the urologist, Dr.Lomboy, to schedule an appointment to discuss her repeat urinary tract infections. Please abstain from alcohol to see if your diarrhea improves. If he develop new or worsening symptoms including fever, chills, or inability to keep down fluids you can return to the emergency department for reevaluation.

## 2017-05-28 LAB — GASTROINTESTINAL PANEL BY PCR, STOOL (REPLACES STOOL CULTURE)

## 2017-05-30 LAB — H. PYLORI ANTIBODY, IGG: H Pylori IgG: <0.8 Index Value (ref 0.00–0.79)

## 2017-06-27 ENCOUNTER — Other Ambulatory Visit: Payer: Self-pay | Admitting: Internal Medicine

## 2017-06-27 ENCOUNTER — Ambulatory Visit
Admission: RE | Admit: 2017-06-27 | Discharge: 2017-06-27 | Disposition: A | Discharge status: Home / Self Care | Payer: Commercial Managed Care - PPO | Source: Ambulatory Visit | Attending: Internal Medicine | Admitting: Internal Medicine

## 2017-06-27 DIAGNOSIS — M25562 Pain in left knee: Principal | ICD-10-CM

## 2017-06-27 DIAGNOSIS — M25561 Pain in right knee: Secondary | ICD-10-CM

## 2017-06-27 DIAGNOSIS — R296 Repeated falls: Secondary | ICD-10-CM

## 2017-09-13 ENCOUNTER — Other Ambulatory Visit: Payer: Self-pay | Admitting: Internal Medicine

## 2017-09-13 DIAGNOSIS — R5381 Other malaise: Secondary | ICD-10-CM

## 2017-10-19 ENCOUNTER — Emergency Department (HOSPITAL_COMMUNITY)
Admission: EM | Admit: 2017-10-19 | Discharge: 2017-10-19 | Disposition: A | Discharge status: Home / Self Care | Payer: Commercial Managed Care - PPO | Attending: Emergency Medicine | Admitting: Emergency Medicine

## 2017-10-19 ENCOUNTER — Other Ambulatory Visit: Payer: Self-pay

## 2017-10-19 ENCOUNTER — Encounter (HOSPITAL_COMMUNITY): Payer: Self-pay

## 2017-10-19 DIAGNOSIS — R740 Nonspecific elevation of levels of transaminase and lactic acid dehydrogenase [LDH]: Secondary | ICD-10-CM | POA: Insufficient documentation

## 2017-10-19 DIAGNOSIS — R109 Unspecified abdominal pain: Secondary | ICD-10-CM

## 2017-10-19 DIAGNOSIS — G43A0 Cyclical vomiting, not intractable: Secondary | ICD-10-CM | POA: Diagnosis not present

## 2017-10-19 DIAGNOSIS — I10 Essential (primary) hypertension: Secondary | ICD-10-CM | POA: Diagnosis not present

## 2017-10-19 DIAGNOSIS — E119 Type 2 diabetes mellitus without complications: Secondary | ICD-10-CM | POA: Insufficient documentation

## 2017-10-19 DIAGNOSIS — R1115 Cyclical vomiting syndrome unrelated to migraine: Secondary | ICD-10-CM

## 2017-10-19 DIAGNOSIS — R7401 Elevation of levels of liver transaminase levels: Secondary | ICD-10-CM

## 2017-10-19 DIAGNOSIS — Z87891 Personal history of nicotine dependence: Secondary | ICD-10-CM | POA: Insufficient documentation

## 2017-10-19 DIAGNOSIS — Z79899 Other long term (current) drug therapy: Secondary | ICD-10-CM | POA: Diagnosis not present

## 2017-10-19 DIAGNOSIS — Z794 Long term (current) use of insulin: Secondary | ICD-10-CM | POA: Diagnosis not present

## 2017-10-19 DIAGNOSIS — R1011 Right upper quadrant pain: Secondary | ICD-10-CM | POA: Diagnosis present

## 2017-10-19 LAB — URINALYSIS, ROUTINE W REFLEX MICROSCOPIC
BILIRUBIN URINE: NEGATIVE
Glucose, UA: >=500 mg/dL — AB
KETONES UR: 5 mg/dL — AB
LEUKOCYTES UA: NEGATIVE
Nitrite: NEGATIVE
PROTEIN: 100 mg/dL — AB
Specific Gravity, Urine: 1.026 (ref 1.005–1.030)
pH: 5 (ref 5.0–8.0)

## 2017-10-19 LAB — CBC WITH DIFFERENTIAL/PLATELET
BASOS ABS: 0 10*3/uL (ref 0.0–0.1)
Basophils Relative: 0 %
EOS PCT: 0 %
Eosinophils Absolute: 0 10*3/uL (ref 0.0–0.7)
HEMATOCRIT: 34.3 % — AB (ref 36.0–46.0)
Hemoglobin: 11.2 g/dL — ABNORMAL LOW (ref 12.0–15.0)
LYMPHS ABS: 1.1 10*3/uL (ref 0.7–4.0)
LYMPHS PCT: 11 %
MCH: 28.4 pg (ref 26.0–34.0)
MCHC: 32.7 g/dL (ref 30.0–36.0)
MCV: 87.1 fL (ref 78.0–100.0)
MONO ABS: 0.2 10*3/uL (ref 0.1–1.0)
Monocytes Relative: 2 %
NEUTROS ABS: 8.8 10*3/uL — AB (ref 1.7–7.7)
Neutrophils Relative %: 87 %
Platelets: 347 10*3/uL (ref 150–400)
RBC: 3.94 MIL/uL (ref 3.87–5.11)
RDW: 14.9 % (ref 11.5–15.5)
WBC: 10.1 10*3/uL (ref 4.0–10.5)

## 2017-10-19 LAB — COMPREHENSIVE METABOLIC PANEL
ALT: 252 U/L — AB (ref 14–54)
AST: 582 U/L — AB (ref 15–41)
Albumin: 3.4 g/dL — ABNORMAL LOW (ref 3.5–5.0)
Alkaline Phosphatase: 237 U/L — ABNORMAL HIGH (ref 38–126)
Anion gap: 14 (ref 5–15)
BUN: 13 mg/dL (ref 6–20)
CHLORIDE: 99 mmol/L — AB (ref 101–111)
CO2: 23 mmol/L (ref 22–32)
CREATININE: 0.77 mg/dL (ref 0.44–1.00)
Calcium: 8.8 mg/dL — ABNORMAL LOW (ref 8.9–10.3)
GFR calc Af Amer: >60 mL/min (ref >60)
Glucose, Bld: 336 mg/dL — ABNORMAL HIGH (ref 65–99)
POTASSIUM: 4.6 mmol/L (ref 3.5–5.1)
SODIUM: 136 mmol/L (ref 135–145)
Total Bilirubin: 0.9 mg/dL (ref 0.3–1.2)
Total Protein: 7.8 g/dL (ref 6.5–8.1)

## 2017-10-19 LAB — LIPASE, BLOOD: LIPASE: 15 U/L (ref 11–51)

## 2017-10-19 LAB — POC URINE PREG, ED: Preg Test, Ur: NEGATIVE

## 2017-10-19 MED ORDER — GI COCKTAIL ~~LOC~~
30.0000 mL | Freq: Once | ORAL | Status: AC
  Administered 2017-10-19: 30 mL via ORAL
  Filled 2017-10-19: qty 30

## 2017-10-19 MED ORDER — HALOPERIDOL LACTATE 5 MG/ML IJ SOLN
5.0000 mg | Freq: Once | INTRAMUSCULAR | Status: AC
  Administered 2017-10-19: 5 mg via INTRAVENOUS
  Filled 2017-10-19: qty 1

## 2017-10-19 MED ORDER — SODIUM CHLORIDE 0.9 % IV BOLUS (SEPSIS)
1000.0000 mL | Freq: Once | INTRAVENOUS | Status: AC
  Administered 2017-10-19: 1000 mL via INTRAVENOUS

## 2017-10-19 MED ORDER — FAMOTIDINE IN NACL 20-0.9 MG/50ML-% IV SOLN
20.0000 mg | Freq: Once | INTRAVENOUS | Status: AC
  Administered 2017-10-19: 20 mg via INTRAVENOUS
  Filled 2017-10-19: qty 50

## 2017-10-19 NOTE — ED Provider Notes (Signed)
[facility]-EMERGENCY DEPT Provider Note  CSN: [id number] Arrival date & time: 10/19/17 0751  Chief Complaint(s) Emesis and Diarrhea  HPI Virginia [name] is a 41 y.o. female with a history of bipolar, anxiety, depression, diabetes, hypertension, daily alcohol consumption who presents to the emergency department with exacerbation of her recurrent cyclical nausea, vomiting, diarrhea and abdominal pain.  She reports that this is typical of her prior "gastritis" presentations.  She reports that her pain is epigastric and right upper quadrant, moderate to severe, constant, cramping, exacerbated with oral intake and emesis.  She is endorsing coffee-ground emesis, no frank blood or bilious.  She is endorsing decreased oral tolerance since Monday.  Patient reported taking her last Phenergan suppository on Monday which provided minimal relief.  Tried to self medicate with marijuana brownies yesterday without relief.  Patient is also endorsing strong urine odor smell.  No dysuria. She denies any fevers, chills, chest pain, shortness of breath.  No other associated complaints. HPI  Past Medical History Past Medical History:  Diagnosis Date  . Anxiety   . Arthritis   . Bipolar affective (HCC)   . Depression   . Diabetes mellitus   . Hypertension    Patient Active Problem List   Diagnosis Date Noted  . Urinary tract infection without hematuria   . Dehydration   . Gastroesophageal reflux disease   . Class 2 obesity due to excess calories with body mass index (BMI) of 39.0 to 39.9 in adult   . Hypokalemia   . Hypomagnesemia   . Anxiety 02/20/2017  . Diabetes mellitus without complication (HCC) 02/20/2017  . Sepsis (HCC) 02/20/2017  . Abdominal pain 02/20/2017  . Nausea & vomiting 02/20/2017  . Tobacco abuse 02/20/2017  . Flank pain 02/20/2017  . Hypertension   . Lactic acidosis    Home Medication(s) Prior to Admission medications   Medication Sig Start Date End Date  Taking? Authorizing Provider  albuterol (PROAIR HFA) 108 (90 Base) MCG/ACT inhaler Inhale 2 puffs every 4 (four) hours as needed into the lungs for wheezing or shortness of breath.   Yes [provider]  calcium carbonate (TUMS - DOSED IN MG ELEMENTAL CALCIUM) 500 MG chewable tablet Chew 3,000 mg daily as needed by mouth for indigestion or heartburn.   Yes [provider]  clonazePAM (KLONOPIN) 0.5 MG tablet Take 0.5 mg 2 (two) times daily by mouth. 09/23/17  Yes [provider]  cyclobenzaprine (FLEXERIL) 10 MG tablet Take 10 mg at bedtime by mouth. 09/23/17  Yes [provider]  dimenhyDRINATE (DRAMAMINE) 50 MG tablet Take 50 mg every 6 (six) hours as needed by mouth for nausea.   Yes [provider]  FLUoxetine (PROZAC) 20 MG capsule Take 20 mg daily by mouth. 09/10/17  Yes [provider]  insulin glargine (LANTUS) 100 UNIT/ML injection Inject 18 Units into the skin at bedtime.    Yes [provider]  losartan (COZAAR) 50 MG tablet Take 50 mg daily by mouth. 08/09/17  Yes [provider]  metoprolol tartrate (LOPRESSOR) 50 MG tablet Take 50 mg 2 (two) times daily by mouth.   Yes [provider]  Multiple Vitamin (MULTIVITAMIN WITH MINERALS) TABS tablet Take 1 tablet daily by mouth.   Yes [provider]  omeprazole (PRILOSEC) 40 MG capsule Take 40 mg daily by mouth. 07/12/17  Yes [provider]  Probiotic Product (PROBIOTIC PO) Take 1 capsule daily by mouth.   Yes [provider]  promethazine (PHENERGAN) 25  MG suppository Place 25 mg every 6 (six) hours as needed rectally for nausea or vomiting.   Yes [provider]  ranitidine (ZANTAC) 150 MG tablet Take 150 mg daily as needed by mouth for heartburn (nausea).   Yes [provider]  multivitamin-iron-minerals-folic acid (CENTRUM) chewable tablet Chew 1 tablet by mouth daily. Patient not taking: Reported on 10/19/2017  02/22/17   Vassie Loll, MD  promethazine (PHENERGAN) 25 MG tablet Take 1 tablet (25 mg total) by mouth every 6 (six) hours as needed for nausea. Patient not taking: Reported on 10/19/2017 05/16/17   Trixie Dredge, PA-C  vitamin B-12 (CYANOCOBALAMIN) 1000 MCG tablet Take 1 tablet (1,000 mcg total) by mouth daily. Patient not taking: Reported on 10/19/2017 02/22/17   Vassie Loll, MD                                                                                                                                    Past Surgical History Past Surgical History:  Procedure Laterality Date  . CESAREAN SECTION    . CHOLECYSTECTOMY    . GASTRIC BYPASS    . KNEE SURGERY     Family History Family History  Problem Relation Age of Onset  . Asthma Mother   . Hypertension Mother   . Hypertension Father   . Diabetes Mellitus II Father     Social History Social History   Tobacco Use  . Smoking status: Former Smoker    Packs/day: 0.50    Types: Cigarettes    Last attempt to quit: 02/20/2013    Years since quitting: 4.6  . Smokeless tobacco: Never Used  Substance Use Topics  . Alcohol use: Yes    Comment: occasional  . Drug use: No   Allergies Ace inhibitors and Nsaids  Review of Systems Review of Systems All other systems are reviewed and are negative for acute change except as noted in the HPI  Physical Exam Vital Signs  I have reviewed the triage vital signs BP 133/78   Pulse 100   Temp 98.7 F (37.1 C) (Oral)   Resp (!) 25   Ht 5\' 1"  (1.549 m)   Wt 104.3 kg (230 lb)   SpO2 100%   BMI 43.46 kg/m    Physical Exam  Constitutional: She is oriented to person, place, and time. She appears well-developed and well-nourished. No distress.  HENT:  Head: Normocephalic and atraumatic.  Nose: Nose normal.  Eyes: Conjunctivae and EOM are normal. Pupils are equal, round, and reactive to light. Right eye exhibits no discharge. Left eye exhibits no discharge. No scleral icterus.  Neck:  Normal range of motion. Neck supple.  Cardiovascular: Normal rate and regular rhythm. Exam reveals no gallop and no friction rub.  No murmur heard. Pulmonary/Chest: Effort normal and breath sounds normal. No stridor. No respiratory distress. She has no rales.  Abdominal: Soft. She exhibits no distension. There is tenderness in the  right upper quadrant, epigastric area and periumbilical area. There is no rigidity, no rebound, no guarding and no CVA tenderness. No hernia.  obese  Musculoskeletal: She exhibits no edema or tenderness.  Neurological: She is alert and oriented to person, place, and time.  Skin: Skin is warm and dry. No rash noted. She is not diaphoretic. No erythema.  Psychiatric: She has a normal mood and affect.  Vitals reviewed.   ED Results and Treatments Labs (all labs ordered are listed, but only abnormal results are displayed) Labs Reviewed  COMPREHENSIVE METABOLIC PANEL - Abnormal; Notable for the following components:      Result Value   Chloride 99 (*)    Glucose, Bld 336 (*)    Calcium 8.8 (*)    Albumin 3.4 (*)    AST 582 (*)    ALT 252 (*)    Alkaline Phosphatase 237 (*)    All other components within normal limits  CBC WITH DIFFERENTIAL/PLATELET - Abnormal; Notable for the following components:   Hemoglobin 11.2 (*)    HCT 34.3 (*)    Neutro Abs 8.8 (*)    All other components within normal limits  URINALYSIS, ROUTINE W REFLEX MICROSCOPIC - Abnormal; Notable for the following components:   Color, Urine RED (*)    APPearance HAZY (*)    Glucose, UA >=500 (*)    Hgb urine dipstick SMALL (*)    Ketones, ur 5 (*)    Protein, ur 100 (*)    Bacteria, UA MANY (*)    Squamous Epithelial / LPF 0-5 (*)    All other components within normal limits  LIPASE, BLOOD  POC URINE PREG, ED                                                                                                                         EKG  EKG Interpretation  Date/Time:  Wednesday October 19 2017 09:39:24 EST Ventricular Rate:  93 PR Interval:    QRS Duration: 129 QT Interval:  404 QTC Calculation: 503 R Axis:   -176 Text Interpretation:  Sinus rhythm Borderline short PR interval Nonspecific intraventricular conduction delay No significant change since last tracing Confirmed by Drema Pry [phone]) on 10/19/2017 10:52:35 AM      Radiology No results found. Pertinent labs & imaging results that were available during my care of the patient were reviewed by me and considered in my medical decision making (see chart for details).  Medications Ordered in ED Medications  famotidine (PEPCID) IVPB 20 mg premix (not administered)  haloperidol lactate (HALDOL) injection 5 mg (5 mg Intravenous Given 10/19/17 1038)  gi cocktail (Maalox,Lidocaine,Donnatal) (30 mLs Oral Given 10/19/17 0913)  sodium chloride 0.9 % bolus 1,000 mL (1,000 mLs Intravenous New Bag/Given 10/19/17 1040)  Procedures Procedures  (including critical care time)  Medical Decision Making / ED Course I have reviewed the nursing notes for this encounter and the patient's prior records (if available in EHR or on provided paperwork).    Exacerbation of patient's cyclical abdominal pain, nausea, vomiting diarrhea.  Likely secondary to alcoholic gastritis versus gastroparesis given her history of diabetes.  Screening labs obtain and revealed that patient did not have evidence of diabetic ketoacidosis, pancreatitis.  Patient does have elevated LFTs likely secondary to alcohol consumption.  She is status post cholecystectomy and I doubt biliary obstruction.  Patient was provided with IV fluids and Haldol which provided the patient complete resolution of her symptomatology.  On repeat examination patient had a benign abdominal exam.  No indication for imaging at this time.  Recommended she  follow-up closely with her primary care provider and gastroenterologist for reevaluation of her elevated liver enzymes.  The patient is safe for discharge with strict return precautions.   Final Clinical Impression(s) / ED Diagnoses Final diagnoses:  Non-intractable cyclical vomiting with nausea  Abdominal cramping  Transaminitis   Disposition: Discharge  Condition: Good  I have discussed the results, Dx and Tx plan with the patient who expressed understanding and agree(s) with the plan. Discharge instructions discussed at great length. The patient was given strict return precautions who verbalized understanding of the instructions. No further questions at time of discharge.     Follow Up: Harvest ForestBakare, Mobolaji B, MD [address] [place] [id number] [phone]   in 3-5 days for close follow-up and repeat liver enzyme check.  He may also discuss treatments for alcohol cessation.     This chart was dictated using voice recognition software.  Despite best efforts to proofread,  errors can occur which can change the documentation meaning.   Nira Connardama, Pedro Eduardo, MD 10/19/17 [phone]

## 2017-10-19 NOTE — ED Notes (Signed)
Bed: [id number] Expected date:  Expected time:  Means of arrival:  Comments: EMS/gastroenteritis

## 2017-10-19 NOTE — ED Triage Notes (Signed)
Pt. C/o n/v/d few episodes/day x 1-2 days. She arrives here in no distress. EMS attempted IV. She tells us she has "had this before". She, in an attempt to self-medicate states she "ate a pot brownie yesterday, but it didn't help my nausea and vomiting".

## 2017-10-19 NOTE — ED Notes (Signed)
ATTEMPT X 2 IV START NOT SUCCESSFUL- BLOOD COLLECTED

## 2017-10-19 NOTE — ED Notes (Signed)
ED Provider at bedside. ATTEMPTING US IV ACCESS

## 2017-10-19 NOTE — ED Notes (Signed)
TIM S RN AT BEDSIDE ATTEMPTING TO START IV.

## 2017-10-19 NOTE — ED Notes (Signed)
TIM S CHARGE RN STATED US IV NOT SUCCESSFUL X 2. REQUESTED EDP PEDRO WILL ATTEMPT

## 2017-10-31 ENCOUNTER — Encounter: Payer: Commercial Managed Care - PPO | Admitting: Obstetrics & Gynecology

## 2017-10-31 ENCOUNTER — Encounter: Payer: Self-pay | Admitting: General Practice

## 2018-01-23 NOTE — Progress Notes (Deleted)
[facility] - Clinic Note  01/24/2018     CHIEF COMPLAINT Patient presents for No chief complaint on file.   HISTORY OF PRESENT ILLNESS: Virginia [name] is a 42 y.o. female who presents to the clinic today for:     Referring physician: Harvest ForestBakare, Mobolaji B, MD [address] [place] [phone]  HISTORICAL INFORMATION:   Selected notes from the MEDICAL RECORD NUMBER Referred by Dr. Quintin AltoM. Bakare for DM exam;  LEE-  Ocular Hx-  PMH- DM, HTN    CURRENT MEDICATIONS: No current outpatient medications on file. (Ophthalmic Drugs)   No current facility-administered medications for this visit.  (Ophthalmic Drugs)   Current Outpatient Medications (Other)  Medication Sig   albuterol (PROAIR HFA) 108 (90 Base) MCG/ACT inhaler Inhale 2 puffs every 4 (four) hours as needed into the lungs for wheezing or shortness of breath.   calcium carbonate (TUMS - DOSED IN MG ELEMENTAL CALCIUM) 500 MG chewable tablet Chew 3,000 mg daily as needed by mouth for indigestion or heartburn.   clonazePAM (KLONOPIN) 0.5 MG tablet Take 0.5 mg 2 (two) times daily by mouth.   cyclobenzaprine (FLEXERIL) 10 MG tablet Take 10 mg at bedtime by mouth.   dimenhyDRINATE (DRAMAMINE) 50 MG tablet Take 50 mg every 6 (six) hours as needed by mouth for nausea.   FLUoxetine (PROZAC) 20 MG capsule Take 20 mg daily by mouth.   insulin glargine (LANTUS) 100 UNIT/ML injection Inject 18 Units into the skin at bedtime.    losartan (COZAAR) 50 MG tablet Take 50 mg daily by mouth.   metoprolol tartrate (LOPRESSOR) 50 MG tablet Take 50 mg 2 (two) times daily by mouth.   Multiple Vitamin (MULTIVITAMIN WITH MINERALS) TABS tablet Take 1 tablet daily by mouth.   multivitamin-iron-minerals-folic acid (CENTRUM) chewable tablet Chew 1 tablet by mouth daily. (Patient not taking: Reported on 10/19/2017)   omeprazole (PRILOSEC) 40 MG capsule Take 40 mg daily by mouth.   Probiotic Product  (PROBIOTIC PO) Take 1 capsule daily by mouth.   promethazine (PHENERGAN) 25 MG suppository Place 25 mg every 6 (six) hours as needed rectally for nausea or vomiting.   promethazine (PHENERGAN) 25 MG tablet Take 1 tablet (25 mg total) by mouth every 6 (six) hours as needed for nausea. (Patient not taking: Reported on 10/19/2017)   ranitidine (ZANTAC) 150 MG tablet Take 150 mg daily as needed by mouth for heartburn (nausea).   vitamin B-12 (CYANOCOBALAMIN) 1000 MCG tablet Take 1 tablet (1,000 mcg total) by mouth daily. (Patient not taking: Reported on 10/19/2017)   No current facility-administered medications for this visit.  (Other)      REVIEW OF SYSTEMS:    ALLERGIES Allergies  Allergen Reactions   Ace Inhibitors Nausea And Vomiting and Cough   Nsaids     PAST MEDICAL HISTORY Past Medical History:  Diagnosis Date   Anxiety    Arthritis    Bipolar affective (HCC)    Depression    Diabetes mellitus    Hypertension    Past Surgical History:  Procedure Laterality Date   CESAREAN SECTION     CHOLECYSTECTOMY     GASTRIC BYPASS     KNEE SURGERY      FAMILY HISTORY Family History  Problem Relation Age of Onset   Asthma Mother    Hypertension Mother    Hypertension Father    Diabetes Mellitus II Father     SOCIAL HISTORY Social History   Tobacco Use  Smoking status: Former Smoker    Packs/day: 0.50    Types: Cigarettes    Last attempt to quit: 02/20/2013    Years since quitting: 4.9   Smokeless tobacco: Never Used  Substance Use Topics   Alcohol use: Yes    Comment: occasional   Drug use: No         OPHTHALMIC EXAM:  Not recorded      IMAGING AND PROCEDURES  Imaging and Procedures for 01/23/18           ASSESSMENT/PLAN:    ICD-10-CM   1. Retinal edema H35.81 OCT, Retina - OU - Both Eyes    1.  2.  3.  Ophthalmic Meds Ordered this visit:  No orders of the defined types were placed in this encounter.       No Follow-up on file.  There are no Patient Instructions on file for this visit.   Explained the diagnoses, plan, and follow up with the patient and they expressed understanding.  Patient expressed understanding of the importance of proper follow up care.   This document serves as a record of services personally performed by Karie Chimera, MD, PhD. It was created on their behalf by Virgilio Belling, COA, a certified ophthalmic assistant. The creation of this record is the provider's dictation and/or activities during the visit.  Electronically signed by: Virgilio Belling, COA  01/23/18 8:10 AM   Karie Chimera, M.D., Ph.D. Diseases & Surgery of the Retina and Vitreous [facility] 01/23/18     Abbreviations: M myopia (nearsighted); A astigmatism; H hyperopia (farsighted); P presbyopia; Mrx spectacle prescription;  CTL contact lenses; OD right eye; OS left eye; OU both eyes  XT exotropia; ET esotropia; PEK punctate epithelial keratitis; PEE punctate epithelial erosions; DES dry eye syndrome; MGD meibomian gland dysfunction; ATs artificial tears; PFAT's preservative free artificial tears; NSC nuclear sclerotic cataract; PSC posterior subcapsular cataract; ERM epi-retinal membrane; PVD posterior vitreous detachment; RD retinal detachment; DM diabetes mellitus; DR diabetic retinopathy; NPDR non-proliferative diabetic retinopathy; PDR proliferative diabetic retinopathy; CSME clinically significant macular edema; DME diabetic macular edema; dbh dot blot hemorrhages; CWS cotton wool spot; POAG primary open angle glaucoma; C/D cup-to-disc ratio; HVF humphrey visual field; GVF goldmann visual field; OCT optical coherence tomography; IOP intraocular pressure; BRVO Branch retinal vein occlusion; CRVO central retinal vein occlusion; CRAO central retinal artery occlusion; BRAO branch retinal artery occlusion; RT retinal tear; SB scleral buckle; PPV pars plana vitrectomy; VH  Vitreous hemorrhage; PRP panretinal laser photocoagulation; IVK intravitreal kenalog; VMT vitreomacular traction; MH Macular hole;  NVD neovascularization of the disc; NVE neovascularization elsewhere; AREDS age related eye disease study; ARMD age related macular degeneration; POAG primary open angle glaucoma; EBMD epithelial/anterior basement membrane dystrophy; ACIOL anterior chamber intraocular lens; IOL intraocular lens; PCIOL posterior chamber intraocular lens; Phaco/IOL phacoemulsification with intraocular lens placement; PRK photorefractive keratectomy; LASIK laser assisted in situ keratomileusis; HTN hypertension; DM diabetes mellitus; COPD chronic obstructive pulmonary disease

## 2018-01-24 ENCOUNTER — Encounter (INDEPENDENT_AMBULATORY_CARE_PROVIDER_SITE_OTHER): Payer: Self-pay | Admitting: Ophthalmology

## 2018-04-18 ENCOUNTER — Other Ambulatory Visit: Payer: Self-pay | Admitting: Internal Medicine

## 2018-04-18 ENCOUNTER — Ambulatory Visit
Admission: RE | Admit: 2018-04-18 | Discharge: 2018-04-18 | Disposition: A | Discharge status: Home / Self Care | Payer: Commercial Managed Care - PPO | Source: Ambulatory Visit | Attending: Internal Medicine | Admitting: Internal Medicine

## 2018-04-18 DIAGNOSIS — R569 Unspecified convulsions: Secondary | ICD-10-CM

## 2018-04-19 ENCOUNTER — Other Ambulatory Visit: Payer: Self-pay | Admitting: Internal Medicine

## 2018-04-19 DIAGNOSIS — G4089 Other seizures: Secondary | ICD-10-CM

## 2018-11-09 IMAGING — CR DG HIP (WITH OR WITHOUT PELVIS) 2-3V*R*
3 series · 3 of 3 positions shown · non-contrast
Comparison: None.

CLINICAL DATA: Chronic right hip pain, worse with range of motion.
No trauma.

EXAM:
DG HIP (WITH OR WITHOUT PELVIS) 2-3V RIGHT

[w pelvis upright]
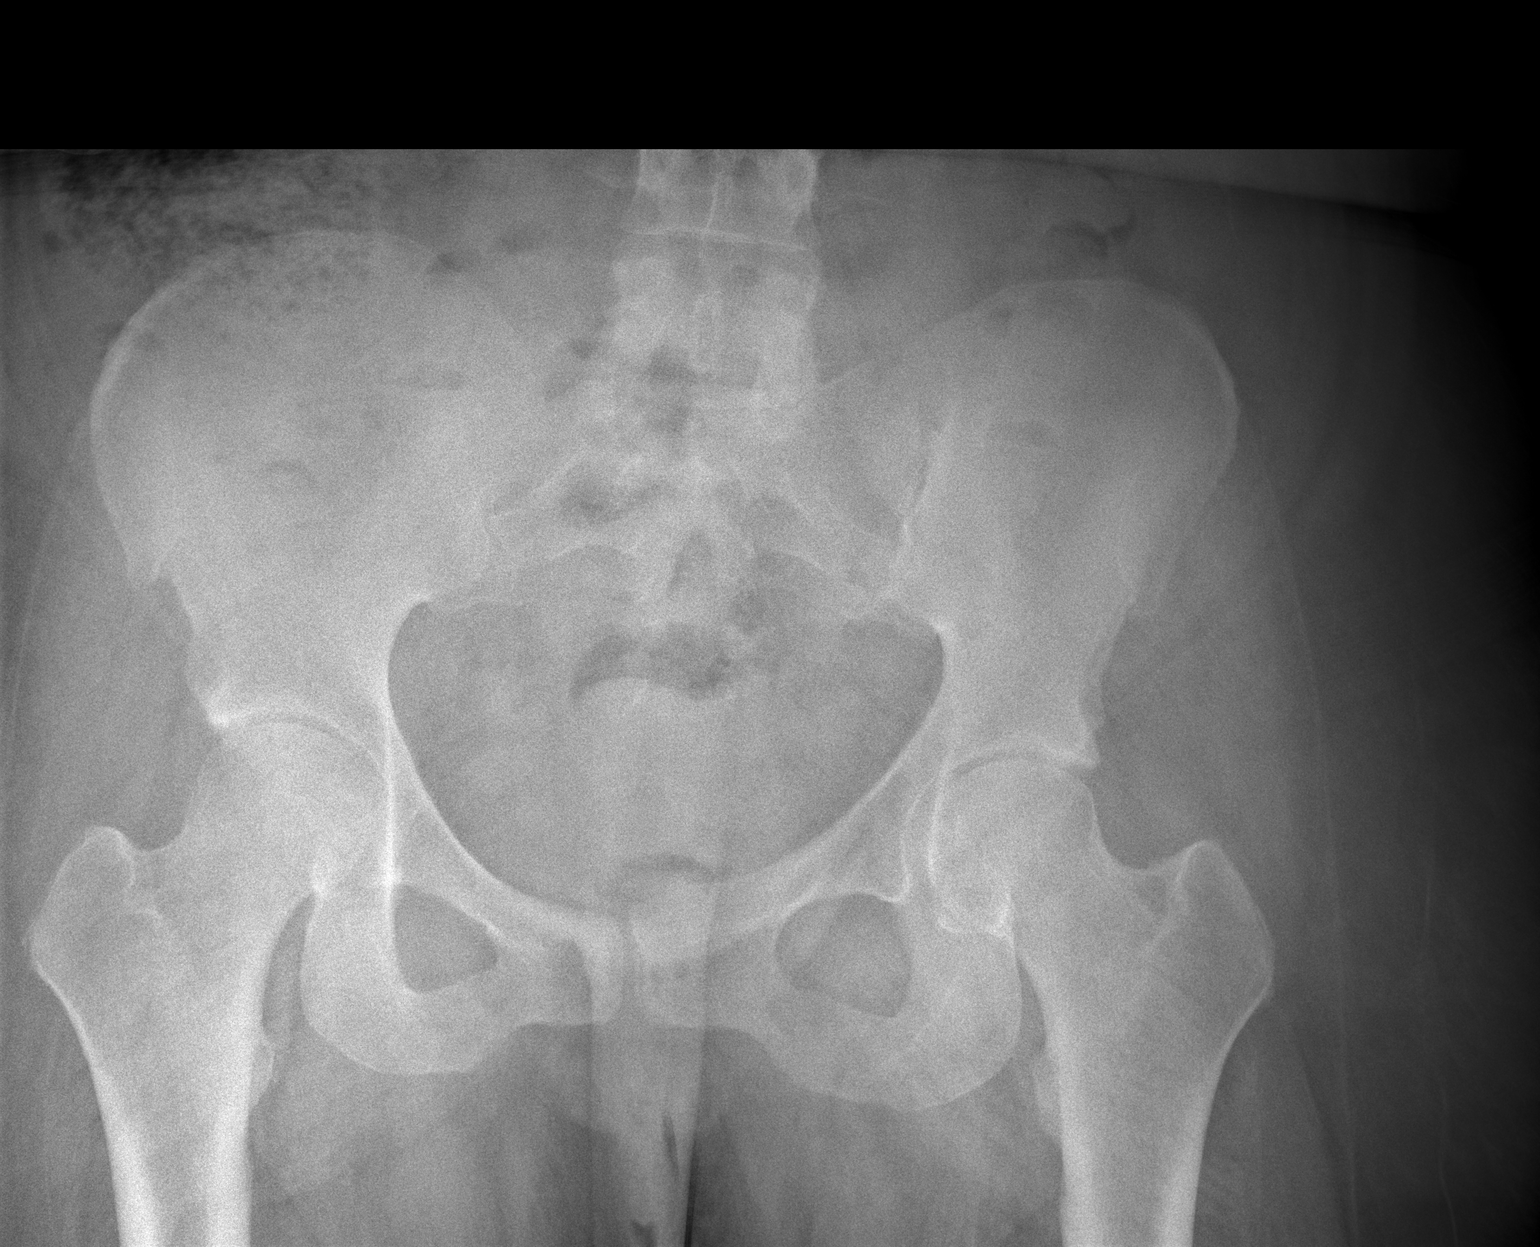

[w hip ap right]
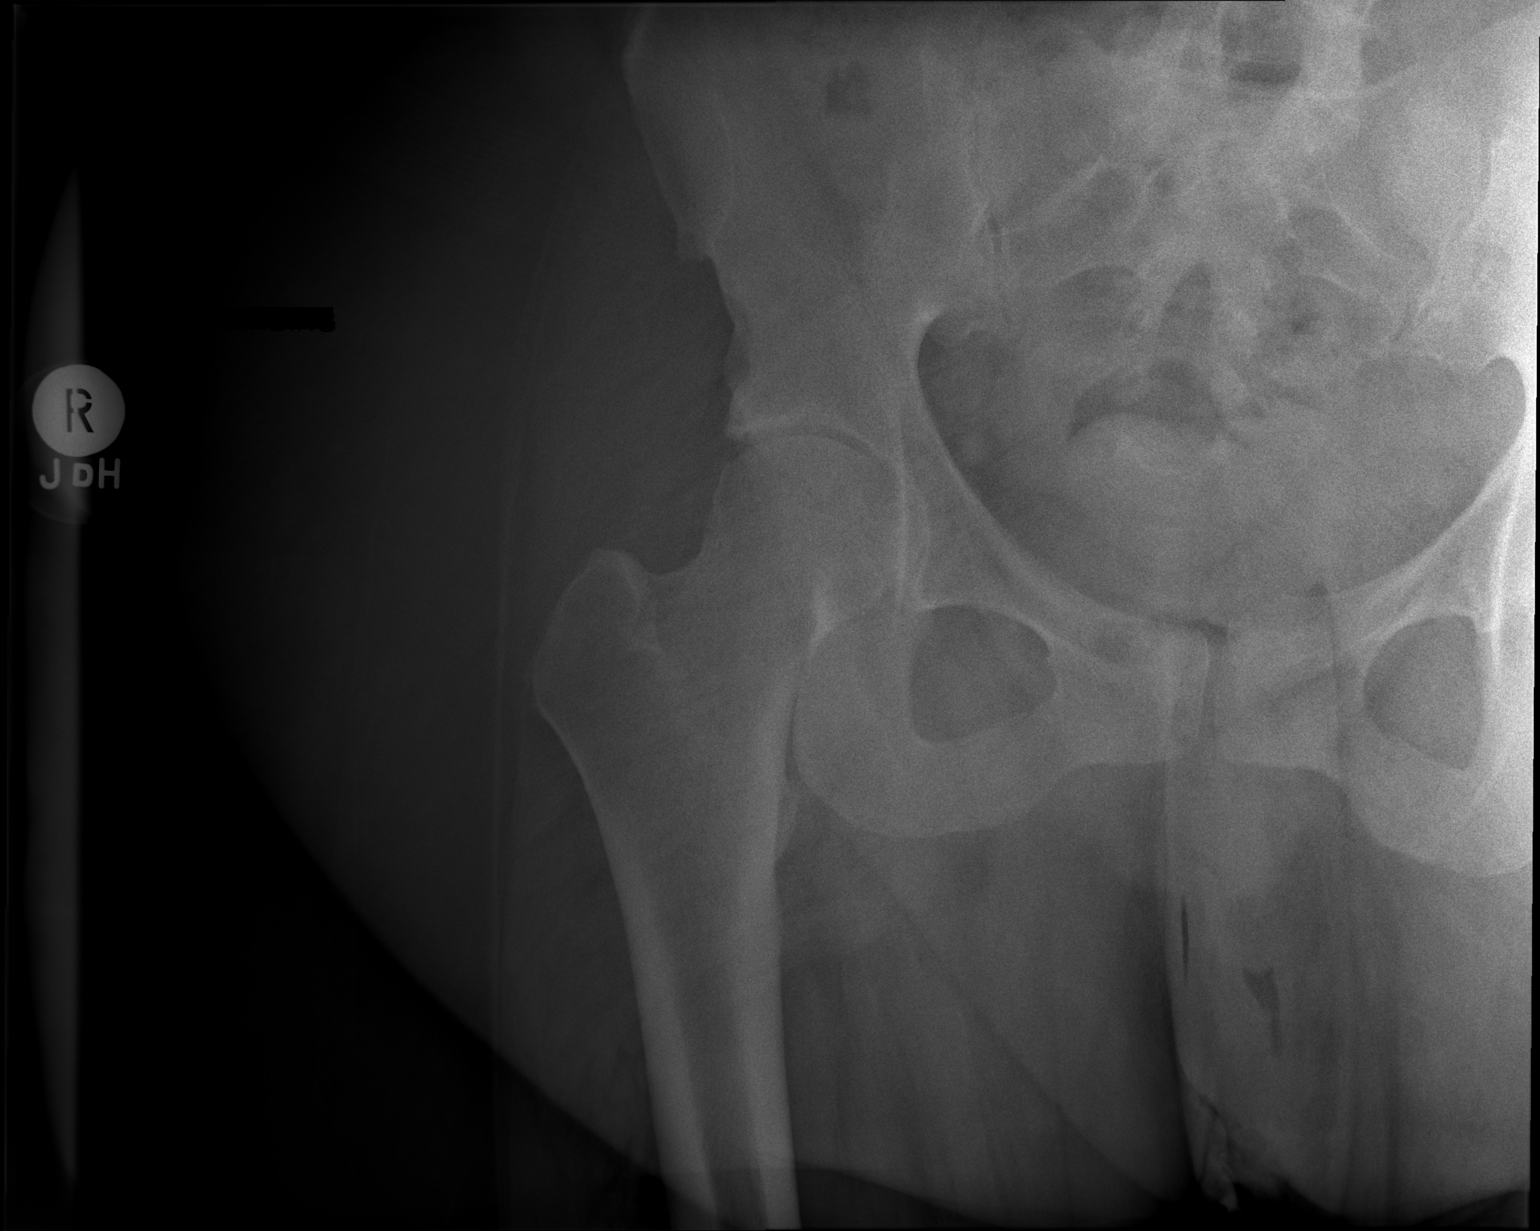

[w hip frog right]
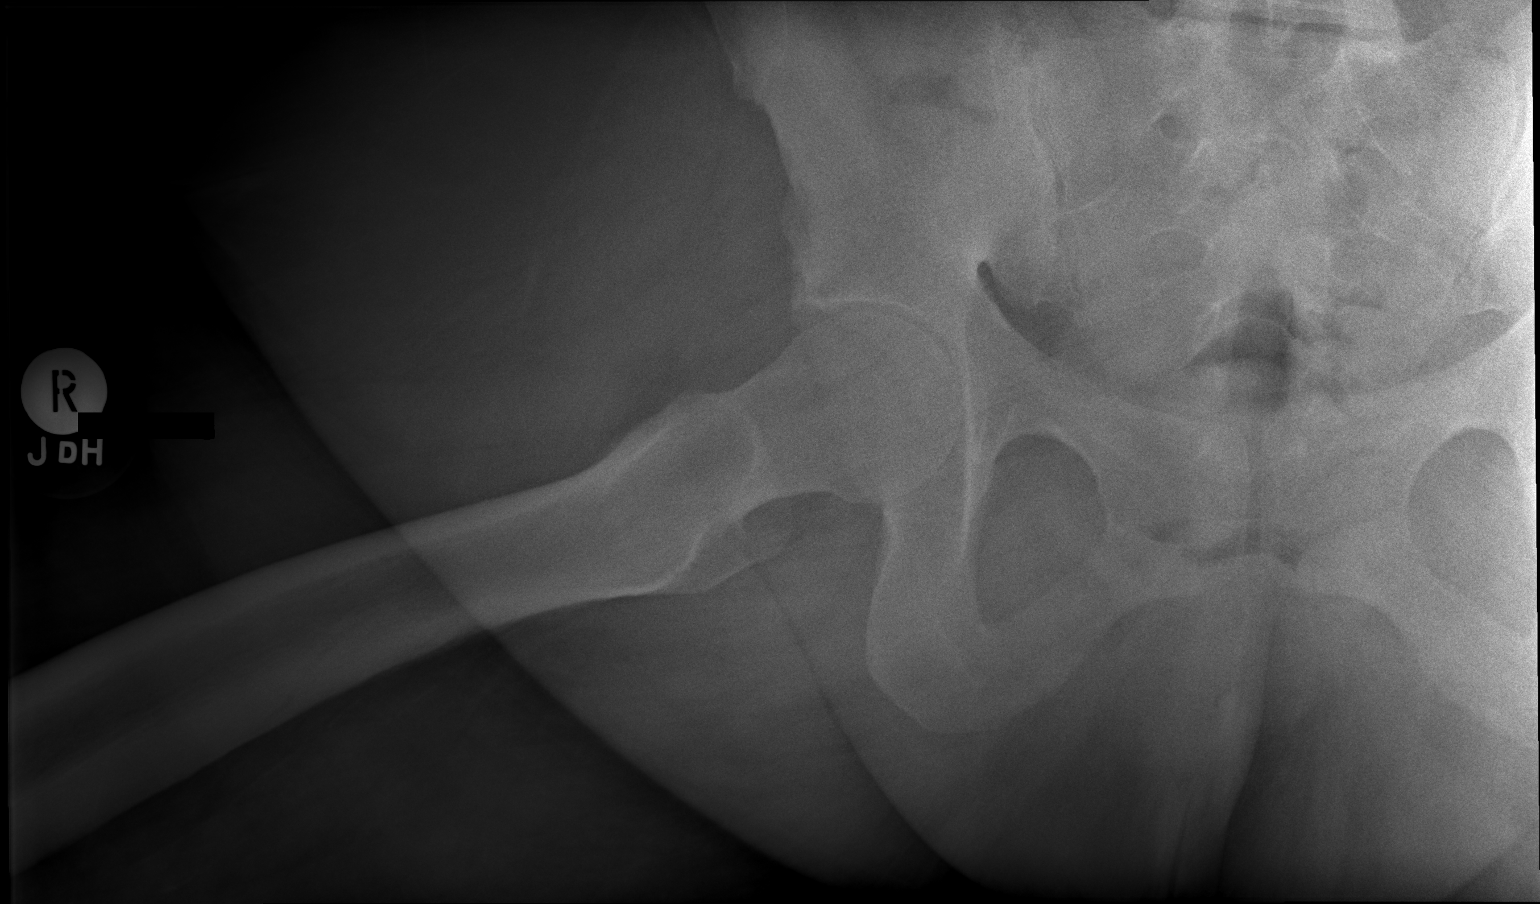

[3 of 3 positions shown; findings below may reference images not displayed]

FINDINGS: There is no evidence of hip fracture or dislocation. There is no
evidence of arthropathy or other focal bone abnormality.
IMPRESSION: Negative.

## 2018-11-09 IMAGING — CR DG CHEST 2V
2 series · 2 of 2 positions shown · non-contrast
Comparison: 12/10/2010

CLINICAL DATA: Shortness of breath and chest congestion for 2
weeks.

EXAM:
CHEST  2 VIEW

[w chest pa]
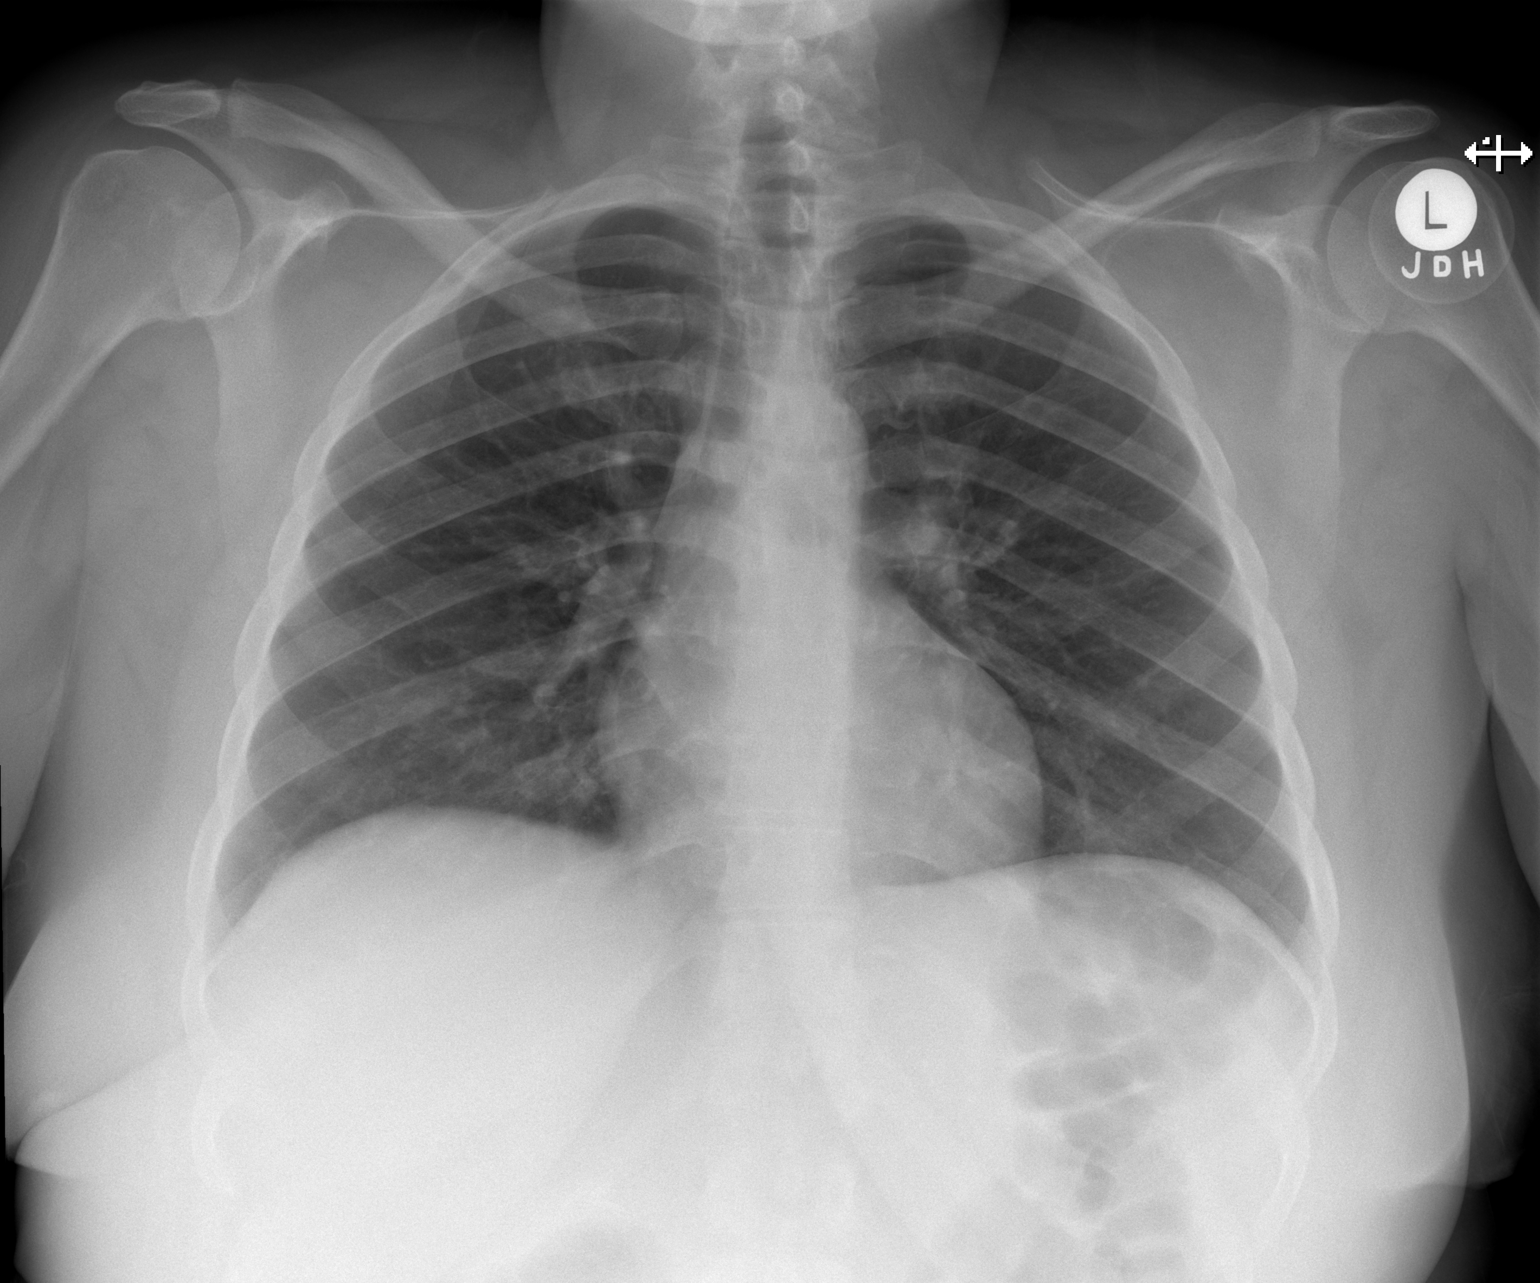

[w chest lat]
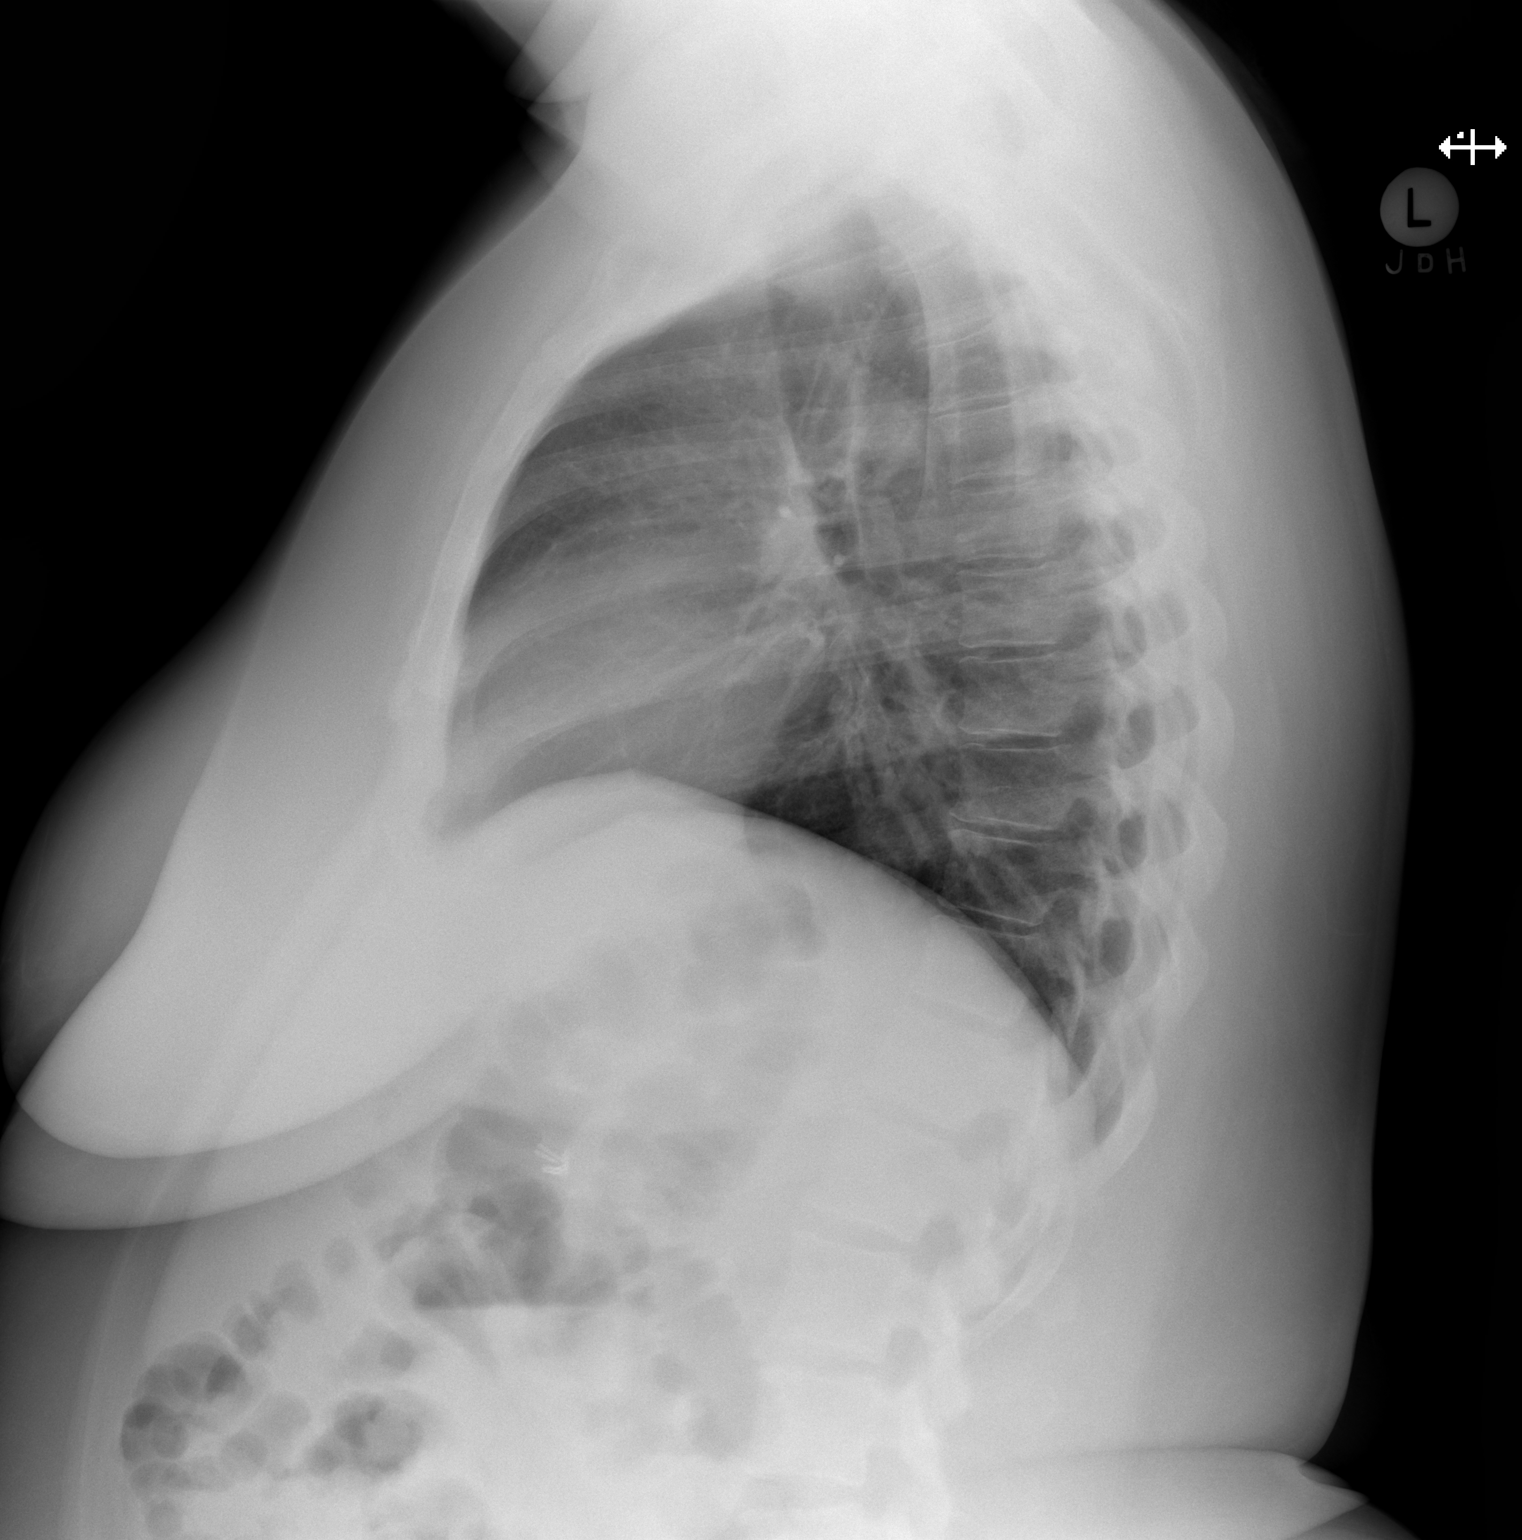

[2 of 2 positions shown; findings below may reference images not displayed]

FINDINGS: The heart size and mediastinal contours are within normal limits.
Both lungs are clear. The visualized skeletal structures are
unremarkable.
IMPRESSION: Negative.  No active cardiopulmonary disease.

## 2019-07-12 ENCOUNTER — Emergency Department (HOSPITAL_COMMUNITY)
Admission: EM | Admit: 2019-07-12 | Discharge: 2019-07-13 | Disposition: A | Discharge status: Home / Self Care | Payer: Commercial Managed Care - PPO | Attending: Emergency Medicine | Admitting: Emergency Medicine

## 2019-07-12 DIAGNOSIS — N3000 Acute cystitis without hematuria: Secondary | ICD-10-CM | POA: Insufficient documentation

## 2019-07-12 DIAGNOSIS — I10 Essential (primary) hypertension: Secondary | ICD-10-CM | POA: Diagnosis not present

## 2019-07-12 DIAGNOSIS — J020 Streptococcal pharyngitis: Secondary | ICD-10-CM

## 2019-07-12 DIAGNOSIS — Z794 Long term (current) use of insulin: Secondary | ICD-10-CM | POA: Diagnosis not present

## 2019-07-12 DIAGNOSIS — R102 Pelvic and perineal pain: Secondary | ICD-10-CM | POA: Insufficient documentation

## 2019-07-12 DIAGNOSIS — Z79899 Other long term (current) drug therapy: Secondary | ICD-10-CM | POA: Insufficient documentation

## 2019-07-12 DIAGNOSIS — E1165 Type 2 diabetes mellitus with hyperglycemia: Secondary | ICD-10-CM | POA: Diagnosis not present

## 2019-07-12 DIAGNOSIS — Z87891 Personal history of nicotine dependence: Secondary | ICD-10-CM | POA: Diagnosis not present

## 2019-07-12 DIAGNOSIS — R739 Hyperglycemia, unspecified: Secondary | ICD-10-CM

## 2019-07-12 DIAGNOSIS — N730 Acute parametritis and pelvic cellulitis: Secondary | ICD-10-CM | POA: Diagnosis not present

## 2019-07-12 DIAGNOSIS — R509 Fever, unspecified: Secondary | ICD-10-CM | POA: Diagnosis present

## 2019-07-12 LAB — CBC WITH DIFFERENTIAL/PLATELET
Abs Immature Granulocytes: 0.14 10*3/uL — ABNORMAL HIGH (ref 0.00–0.07)
Basophils Absolute: 0 10*3/uL (ref 0.0–0.1)
Basophils Relative: 0 %
Eosinophils Absolute: 0.2 10*3/uL (ref 0.0–0.5)
Eosinophils Relative: 1 %
HCT: 33.2 % — ABNORMAL LOW (ref 36.0–46.0)
Hemoglobin: 10 g/dL — ABNORMAL LOW (ref 12.0–15.0)
Immature Granulocytes: 1 %
Lymphocytes Relative: 18 %
Lymphs Abs: 2.6 10*3/uL (ref 0.7–4.0)
MCH: 27 pg (ref 26.0–34.0)
MCHC: 30.1 g/dL (ref 30.0–36.0)
MCV: 89.5 fL (ref 80.0–100.0)
Monocytes Absolute: 0.7 10*3/uL (ref 0.1–1.0)
Monocytes Relative: 5 %
Neutro Abs: 10.6 10*3/uL — ABNORMAL HIGH (ref 1.7–7.7)
Neutrophils Relative %: 75 %
Platelets: 336 10*3/uL (ref 150–400)
RBC: 3.71 MIL/uL — ABNORMAL LOW (ref 3.87–5.11)
RDW: 14.6 % (ref 11.5–15.5)
WBC: 14.3 10*3/uL — ABNORMAL HIGH (ref 4.0–10.5)
nRBC: 0 % (ref 0.0–0.2)

## 2019-07-12 LAB — COMPREHENSIVE METABOLIC PANEL
ALT: 31 U/L (ref 0–44)
AST: 22 U/L (ref 15–41)
Albumin: 3.2 g/dL — ABNORMAL LOW (ref 3.5–5.0)
Alkaline Phosphatase: 197 U/L — ABNORMAL HIGH (ref 38–126)
Anion gap: 11 (ref 5–15)
BUN: 12 mg/dL (ref 6–20)
CO2: 25 mmol/L (ref 22–32)
Calcium: 8.2 mg/dL — ABNORMAL LOW (ref 8.9–10.3)
Chloride: 102 mmol/L (ref 98–111)
Creatinine, Ser: 0.83 mg/dL (ref 0.44–1.00)
GFR calc Af Amer: >60 mL/min (ref >60)
GFR calc non Af Amer: >60 mL/min (ref >60)
Glucose, Bld: 289 mg/dL — ABNORMAL HIGH (ref 70–99)
Potassium: 3.2 mmol/L — ABNORMAL LOW (ref 3.5–5.1)
Sodium: 138 mmol/L (ref 135–145)
Total Bilirubin: 0.3 mg/dL (ref 0.3–1.2)
Total Protein: 8.4 g/dL — ABNORMAL HIGH (ref 6.5–8.1)

## 2019-07-12 LAB — GROUP A STREP BY PCR: Group A Strep by PCR: DETECTED — AB

## 2019-07-12 LAB — I-STAT BETA HCG BLOOD, ED (MC, WL, AP ONLY): I-stat hCG, quantitative: <5 m[IU]/mL (ref <5)

## 2019-07-12 LAB — CBG MONITORING, ED: Glucose-Capillary: 263 mg/dL — ABNORMAL HIGH (ref 70–99)

## 2019-07-12 MED ORDER — FENTANYL CITRATE (PF) 100 MCG/2ML IJ SOLN
50.0000 ug | Freq: Once | INTRAMUSCULAR | Status: AC
  Administered 2019-07-12: 50 ug via INTRAVENOUS
  Filled 2019-07-12: qty 2

## 2019-07-12 NOTE — ED Triage Notes (Signed)
Pt complains of hyperglycemia and not being able to take her meds in awhile, she also complains of cold sx including an earache, then she states that she has a yeast infection

## 2019-07-13 LAB — WET PREP, GENITAL
Clue Cells Wet Prep HPF POC: NONE SEEN
Sperm: NONE SEEN
Trich, Wet Prep: NONE SEEN
WBC, Wet Prep HPF POC: NONE SEEN
Yeast Wet Prep HPF POC: NONE SEEN

## 2019-07-13 LAB — URINALYSIS, ROUTINE W REFLEX MICROSCOPIC
Bilirubin Urine: NEGATIVE
Glucose, UA: >=500 mg/dL — AB
Ketones, ur: 5 mg/dL — AB
Nitrite: NEGATIVE
Protein, ur: 100 mg/dL — AB
Specific Gravity, Urine: 1.023 (ref 1.005–1.030)
pH: 5 (ref 5.0–8.0)

## 2019-07-13 LAB — LACTIC ACID, PLASMA: Lactic Acid, Venous: 1 mmol/L (ref 0.5–1.9)

## 2019-07-13 MED ORDER — STERILE WATER FOR INJECTION IJ SOLN
INTRAMUSCULAR | Status: AC
  Administered 2019-07-13: 2 mL
  Filled 2019-07-13: qty 10

## 2019-07-13 MED ORDER — CEFTRIAXONE SODIUM 250 MG IJ SOLR
250.0000 mg | Freq: Once | INTRAMUSCULAR | Status: AC
  Administered 2019-07-13: 250 mg via INTRAMUSCULAR
  Filled 2019-07-13: qty 250

## 2019-07-13 MED ORDER — HYDROCODONE-ACETAMINOPHEN 5-325 MG PO TABS: 1.0000 | ORAL_TABLET | Freq: Four times a day (QID) | ORAL | 0 refills | Status: DC | PRN

## 2019-07-13 MED ORDER — CEPHALEXIN 500 MG PO CAPS: 500.0000 mg | ORAL_CAPSULE | Freq: Four times a day (QID) | ORAL | 0 refills | Status: DC

## 2019-07-13 MED ORDER — SODIUM CHLORIDE 0.9 % IV BOLUS
1000.0000 mL | Freq: Once | INTRAVENOUS | Status: AC
  Administered 2019-07-13: 1000 mL via INTRAVENOUS

## 2019-07-13 MED ORDER — KETOROLAC TROMETHAMINE 30 MG/ML IJ SOLN
15.0000 mg | Freq: Once | INTRAMUSCULAR | Status: AC
  Administered 2019-07-13: 15 mg via INTRAVENOUS
  Filled 2019-07-13: qty 1

## 2019-07-13 MED ORDER — ONDANSETRON 4 MG PO TBDP: 4.0000 mg | ORAL_TABLET | Freq: Three times a day (TID) | ORAL | 0 refills | Status: DC | PRN

## 2019-07-13 MED ORDER — ONDANSETRON HCL 4 MG/2ML IJ SOLN
4.0000 mg | Freq: Once | INTRAMUSCULAR | Status: DC | PRN
  Filled 2019-07-13: qty 2

## 2019-07-13 MED ORDER — DOXYCYCLINE HYCLATE 100 MG PO CAPS: 100.0000 mg | ORAL_CAPSULE | Freq: Two times a day (BID) | ORAL | 0 refills | Status: DC

## 2019-07-13 MED ORDER — OXYCODONE-ACETAMINOPHEN 5-325 MG PO TABS
1.0000 | ORAL_TABLET | Freq: Once | ORAL | Status: AC
  Administered 2019-07-13: 1 via ORAL
  Filled 2019-07-13: qty 1

## 2019-07-13 MED ORDER — FLUCONAZOLE 150 MG PO TABS: 150.0000 mg | ORAL_TABLET | Freq: Every day | ORAL | 0 refills | Status: AC

## 2019-07-13 MED ORDER — MORPHINE SULFATE (PF) 4 MG/ML IV SOLN
4.0000 mg | Freq: Once | INTRAVENOUS | Status: AC
  Administered 2019-07-13: 4 mg via INTRAVENOUS
  Filled 2019-07-13: qty 1

## 2019-07-13 MED ORDER — FENTANYL CITRATE (PF) 100 MCG/2ML IJ SOLN
50.0000 ug | Freq: Once | INTRAMUSCULAR | Status: AC
  Administered 2019-07-13: 50 ug via INTRAVENOUS
  Filled 2019-07-13: qty 2

## 2019-07-13 NOTE — ED Notes (Signed)
Pt calling her husband for a ride home.  

## 2019-07-13 NOTE — Discharge Instructions (Addendum)
Please take all of your antibiotics until finished! Zofran as needed for nausea. I have given you a few pain pills to take only as needed for severe pain.   Please call your primary care doctor tomorrow to schedule a follow up appointment.   Return to ER for new or worsening symptoms, any additional concerns.

## 2019-07-13 NOTE — ED Provider Notes (Signed)
2:20 AM Patient care [facility] from [facility], PA-C at change of shift.  Patient pending urinalysis given complaints of lower abdominal pain.  Her urine is notable for bacteriuria, mild pyuria.  Little squamous cells, therefore unable to exclude UTI versus contamination.  Plan for discharge on Keflex as well as doxycycline.  Patient given a shot of Rocephin prior to discharge.  Plan for coverage of possible PID as well as UTI and strep throat.  I did discuss with the patient that her lower abdominal discomfort may be secondary to mesenteric adenitis which can often occur in the presence of strep infection.  She has been encouraged to follow-up with her primary care doctor.  Return precautions discussed and provided. Patient discharged in stable condition with no unaddressed concerns.   Results for orders placed or performed during the hospital encounter of 07/12/19  Group A Strep by PCR   Specimen: Throat; Sterile Swab  Result Value Ref Range   Group A Strep by PCR DETECTED (A) NOT DETECTED  Wet prep, genital  Result Value Ref Range   Yeast Wet Prep HPF POC NONE SEEN NONE SEEN   Trich, Wet Prep NONE SEEN NONE SEEN   Clue Cells Wet Prep HPF POC NONE SEEN NONE SEEN   WBC, Wet Prep HPF POC NONE SEEN NONE SEEN   Sperm NONE SEEN   Culture, blood (routine x 2)   Specimen: BLOOD RIGHT FOREARM  Result Value Ref Range   Specimen Description      BLOOD RIGHT FOREARM Performed at [facility], [address] [place], [place] [address]    Special Requests      BOTTLES DRAWN AEROBIC AND ANAEROBIC Blood Culture results may not be optimal due to an excessive volume of blood received in culture bottles Performed at [facility], [address] [place], [place] [address]    Culture PENDING    Report Status PENDING   Culture, blood (routine x 2)   Specimen: BLOOD RIGHT HAND  Result Value Ref Range   Specimen Description      BLOOD RIGHT HAND Performed at [facility], [address] [place], [place] [address]    Special Requests      BOTTLES DRAWN AEROBIC ONLY Blood Culture adequate volume Performed at [facility], [address] [place], [place] [address]    Culture PENDING    Report Status PENDING   Urinalysis, Routine w reflex microscopic  Result Value Ref Range   Color, Urine YELLOW YELLOW   APPearance CLOUDY (A) CLEAR   Specific Gravity, Urine 1.023 1.005 - 1.030   pH 5.0 5.0 - 8.0   Glucose, UA >=500 (A) NEGATIVE mg/dL   Hgb urine dipstick SMALL (A) NEGATIVE   Bilirubin Urine NEGATIVE NEGATIVE   Ketones, ur 5 (A) NEGATIVE mg/dL   Protein, ur 295100 (A) NEGATIVE mg/dL   Nitrite NEGATIVE NEGATIVE   Leukocytes,Ua TRACE (A) NEGATIVE   RBC / HPF 0-5 0 - 5 RBC/hpf   WBC, UA 11-20 0 - 5 WBC/hpf   Bacteria, UA MANY (A) NONE SEEN   Squamous Epithelial / LPF 0-5 0 - 5   Mucus PRESENT    Hyaline Casts, UA PRESENT   CBC with Differential  Result Value Ref Range   WBC 14.3 (H) 4.0 - 10.5 K/uL   RBC 3.71 (L) 3.87 - 5.11 MIL/uL   Hemoglobin 10.0 (L) 12.0 - 15.0 g/dL   HCT 62.133.2 (L) 30.836.0 - 65.746.0 %  MCV 89.5 80.0 - 100.0 fL   MCH 27.0 26.0 - 34.0 pg   MCHC 30.1 30.0 - 36.0 g/dL   RDW 14.6 11.5 - 15.5 %   Platelets 336 150 - 400 K/uL   nRBC 0.0 0.0 - 0.2 %   Neutrophils Relative % 75 %   Neutro Abs 10.6 (H) 1.7 - 7.7 K/uL   Lymphocytes Relative 18 %   Lymphs Abs 2.6 0.7 - 4.0 K/uL   Monocytes Relative 5 %   Monocytes Absolute 0.7 0.1 - 1.0 K/uL   Eosinophils Relative 1 %   Eosinophils Absolute 0.2 0.0 - 0.5 K/uL   Basophils Relative 0 %   Basophils Absolute 0.0 0.0 - 0.1 K/uL   Immature Granulocytes 1 %   Abs Immature Granulocytes 0.14 (H) 0.00 - 0.07 K/uL  Comprehensive metabolic panel  Result Value Ref Range   Sodium 138 135 - 145 mmol/L   Potassium 3.2 (L) 3.5 - 5.1 mmol/L   Chloride 102 98 - 111 mmol/L   CO2 25 22 - 32 mmol/L   Glucose, Bld 289 (H) 70 - 99 mg/dL   BUN 12 6 - 20 mg/dL   Creatinine, Ser  0.83 0.44 - 1.00 mg/dL   Calcium 8.2 (L) 8.9 - 10.3 mg/dL   Total Protein 8.4 (H) 6.5 - 8.1 g/dL   Albumin 3.2 (L) 3.5 - 5.0 g/dL   AST 22 15 - 41 U/L   ALT 31 0 - 44 U/L   Alkaline Phosphatase 197 (H) 38 - 126 U/L   Total Bilirubin 0.3 0.3 - 1.2 mg/dL   GFR calc non Af Amer >60 >60 mL/min   GFR calc Af Amer >60 >60 mL/min   Anion gap 11 5 - 15  Lactic acid, plasma  Result Value Ref Range   Lactic Acid, Venous 1.0 0.5 - 1.9 mmol/L  CBG monitoring, ED  Result Value Ref Range   Glucose-Capillary 263 (H) 70 - 99 mg/dL  I-Stat beta hCG blood, ED  Result Value Ref Range   I-stat hCG, quantitative <5.0 <5 mIU/mL   Comment 3             Antonietta Breach, PA-C 07/13/19 0400    [name], Delice Bison, DO 07/13/19 [id number]

## 2019-07-13 NOTE — ED Provider Notes (Signed)
[facility]-EMERGENCY DEPT Provider Note   CSN: [id number] Arrival date & time: 07/12/19  2146    History   Chief Complaint Chief Complaint  Patient presents with  . Hyperglycemia    HPI Virginia [name] is a 43 y.o. female.     The history is provided by the patient and medical records. No language interpreter was used.   Virginia [name] is a 43 y.o. female  with a PMH as listed below including DM who presents to the Emergency Department with multiple complaints.  She states she overall just feels unwell.  Subjective fever and chills.  Has not checked her temperature at home.  Temp 98.7 in the ED today.  Associated symptoms include a sore throat, dysuria, back pain and vaginal discharge.  Has had some pelvic pain, but denies abdominal pain.  No nausea or vomiting.  No medications taken prior to arrival for symptoms.    Past Medical History:  Diagnosis Date  . Anxiety   . Arthritis   . Bipolar affective (HCC)   . Depression   . Diabetes mellitus   . Hypertension     Patient Active Problem List   Diagnosis Date Noted  . Urinary tract infection without hematuria   . Dehydration   . Gastroesophageal reflux disease   . Class 2 obesity due to excess calories with body mass index (BMI) of 39.0 to 39.9 in adult   . Hypokalemia   . Hypomagnesemia   . Anxiety 02/20/2017  . Diabetes mellitus without complication (HCC) 02/20/2017  . Sepsis (HCC) 02/20/2017  . Abdominal pain 02/20/2017  . Nausea & vomiting 02/20/2017  . Tobacco abuse 02/20/2017  . Flank pain 02/20/2017  . Hypertension   . Lactic acidosis     Past Surgical History:  Procedure Laterality Date  . CESAREAN SECTION    . CHOLECYSTECTOMY    . GASTRIC BYPASS    . KNEE SURGERY       OB History   No obstetric history on file.      Home Medications    Prior to Admission medications   Medication Sig Start Date End Date Taking? Authorizing Provider  albuterol (PROAIR HFA) 108 (90 Base)  MCG/ACT inhaler Inhale 2 puffs every 4 (four) hours as needed into the lungs for wheezing or shortness of breath.    [provider]  calcium carbonate (TUMS - DOSED IN MG ELEMENTAL CALCIUM) 500 MG chewable tablet Chew 3,000 mg daily as needed by mouth for indigestion or heartburn.    [provider]  cephALEXin (KEFLEX) 500 MG capsule Take 1 capsule (500 mg total) by mouth 4 (four) times daily. 07/13/19   Olive Zmuda, Chase PicketJaime Pilcher, PA-C  clonazePAM (KLONOPIN) 0.5 MG tablet Take 0.5 mg 2 (two) times daily by mouth. 09/23/17   [provider]  cyclobenzaprine (FLEXERIL) 10 MG tablet Take 10 mg at bedtime by mouth. 09/23/17   [provider]  dimenhyDRINATE (DRAMAMINE) 50 MG tablet Take 50 mg every 6 (six) hours as needed by mouth for nausea.    [provider]  doxycycline (VIBRAMYCIN) 100 MG capsule Take 1 capsule (100 mg total) by mouth 2 (two) times daily. 07/13/19   Roston Grunewald, Chase PicketJaime Pilcher, PA-C  fluconazole (DIFLUCAN) 150 MG tablet Take 1 tablet (150 mg total) by mouth daily for 2 days. Take 1 tablet (150 mg total) by mouth today and again in 3 days. 07/13/19 07/15/19  Electra Paladino, Chase PicketJaime Pilcher, PA-C  FLUoxetine (PROZAC) 20 MG capsule Take 20 mg daily  by mouth. 09/10/17   [provider]  HYDROcodone-acetaminophen (NORCO/VICODIN) 5-325 MG tablet Take 1 tablet by mouth every 6 (six) hours as needed for severe pain. 07/13/19   Wenzel Backlund, Ozella Almond, PA-C  insulin glargine (LANTUS) 100 UNIT/ML injection Inject 18 Units into the skin at bedtime.     [provider]  losartan (COZAAR) 50 MG tablet Take 50 mg daily by mouth. 08/09/17   [provider]  metoprolol tartrate (LOPRESSOR) 50 MG tablet Take 50 mg 2 (two) times daily by mouth.    [provider]  Multiple Vitamin (MULTIVITAMIN WITH MINERALS) TABS tablet Take 1 tablet daily by mouth.    [provider]  multivitamin-iron-minerals-folic acid (CENTRUM) chewable tablet Chew 1 tablet by  mouth daily. Patient not taking: Reported on 10/19/2017 02/22/17   Barton Dubois, MD  omeprazole (PRILOSEC) 40 MG capsule Take 40 mg daily by mouth. 07/12/17   [provider]  ondansetron (ZOFRAN ODT) 4 MG disintegrating tablet Take 1 tablet (4 mg total) by mouth every 8 (eight) hours as needed for nausea or vomiting. 07/13/19   Mekhai Venuto, Ozella Almond, PA-C  Probiotic Product (PROBIOTIC PO) Take 1 capsule daily by mouth.    [provider]  promethazine (PHENERGAN) 25 MG suppository Place 25 mg every 6 (six) hours as needed rectally for nausea or vomiting.    [provider]  promethazine (PHENERGAN) 25 MG tablet Take 1 tablet (25 mg total) by mouth every 6 (six) hours as needed for nausea. Patient not taking: Reported on 10/19/2017 05/16/17   Clayton Bibles, PA-C  ranitidine (ZANTAC) 150 MG tablet Take 150 mg daily as needed by mouth for heartburn (nausea).    [provider]  vitamin B-12 (CYANOCOBALAMIN) 1000 MCG tablet Take 1 tablet (1,000 mcg total) by mouth daily. Patient not taking: Reported on 10/19/2017 02/22/17   Barton Dubois, MD    Family History Family History  Problem Relation Age of Onset  . Asthma Mother   . Hypertension Mother   . Hypertension Father   . Diabetes Mellitus II Father     Social History Social History   Tobacco Use  . Smoking status: Former Smoker    Packs/day: 0.50    Types: Cigarettes    Quit date: 02/20/2013    Years since quitting: 6.3  . Smokeless tobacco: Never Used  Substance Use Topics  . Alcohol use: Yes    Comment: occasional  . Drug use: No     Allergies   Ace inhibitors and Nsaids   Review of Systems Review of Systems  Constitutional: Positive for chills and fever (Subjective).  HENT: Positive for sore throat. Negative for trouble swallowing.   Respiratory: Negative for cough and shortness of breath.   Cardiovascular: Negative for chest pain.  Gastrointestinal: Negative for abdominal pain, constipation,  diarrhea, nausea and vomiting.  Genitourinary: Positive for dysuria, pelvic pain and vaginal discharge.  Musculoskeletal: Positive for back pain. Negative for neck pain.  Skin: Negative for rash.  All other systems reviewed and are negative.    Physical Exam Updated Vital Signs BP (!) 178/99 (BP Location: Left Arm)   Pulse 100   Temp 98.7 F (37.1 C) (Oral)   Resp 16   LMP 06/13/2019   SpO2 100%   Physical Exam Vitals signs and nursing note reviewed.  Constitutional:      General: She is not in acute distress.    Appearance: She is well-developed.  HENT:     Head: Normocephalic and atraumatic.  Mouth/Throat:     Comments: Oropharynx with erythema and tonsillar hypertrophy. Neck:     Musculoskeletal: Neck supple.  Cardiovascular:     Rate and Rhythm: Normal rate and regular rhythm.     Heart sounds: Normal heart sounds. No murmur.  Pulmonary:     Effort: Pulmonary effort is normal. No respiratory distress.     Breath sounds: Normal breath sounds.  Abdominal:     General: There is no distension.     Palpations: Abdomen is soft.     Comments: Suprapubic tenderness.  No rebound or guarding.  Left-sided CVA tenderness.  Genitourinary:    Comments: Chaperone present for exam. Copious amount of white discharge. No CMT. No adnexal masses, tenderness, or fullness.  No bleeding within vaginal vault. Skin:    General: Skin is warm and dry.  Neurological:     Mental Status: She is alert and oriented to person, place, and time.      ED Treatments / Results  Labs (all labs ordered are listed, but only abnormal results are displayed) Labs Reviewed  GROUP A STREP BY PCR - Abnormal; Notable for the following components:      Result Value   Group A Strep by PCR DETECTED (*)    All other components within normal limits  CBC WITH DIFFERENTIAL/PLATELET - Abnormal; Notable for the following components:   WBC 14.3 (*)    RBC 3.71 (*)    Hemoglobin 10.0 (*)    HCT 33.2 (*)     Neutro Abs 10.6 (*)    Abs Immature Granulocytes 0.14 (*)    All other components within normal limits  COMPREHENSIVE METABOLIC PANEL - Abnormal; Notable for the following components:   Potassium 3.2 (*)    Glucose, Bld 289 (*)    Calcium 8.2 (*)    Total Protein 8.4 (*)    Albumin 3.2 (*)    Alkaline Phosphatase 197 (*)    All other components within normal limits  CBG MONITORING, ED - Abnormal; Notable for the following components:   Glucose-Capillary 263 (*)    All other components within normal limits  WET PREP, GENITAL  URINE CULTURE  CULTURE, BLOOD (ROUTINE X 2)  CULTURE, BLOOD (ROUTINE X 2)  URINALYSIS, ROUTINE W REFLEX MICROSCOPIC  LACTIC ACID, PLASMA  LACTIC ACID, PLASMA  I-STAT BETA HCG BLOOD, ED ([facility], [facility], [facility] ONLY)  GC/CHLAMYDIA PROBE AMP () NOT AT [facility]    EKG None  Radiology No results found.  Procedures Procedures (including critical care time)  Medications Ordered in ED Medications  ondansetron (ZOFRAN) injection 4 mg (has no administration in time range)  fentaNYL (SUBLIMAZE) injection 50 mcg (50 mcg Intravenous Given 07/12/19 2305)  sodium chloride 0.9 % bolus 1,000 mL (1,000 mLs Intravenous New Bag/Given 07/13/19 0027)  cefTRIAXone (ROCEPHIN) injection 250 mg (250 mg Intramuscular Given 07/13/19 0058)  morphine 4 MG/ML injection 4 mg (4 mg Intravenous Given 07/13/19 0037)  sterile water (preservative free) injection (2 mLs  Given 07/13/19 0056)     Initial Impression / Assessment and Plan / ED Course  I have reviewed the triage vital signs and the nursing notes.  Pertinent labs & imaging results that were available during my care of the patient were reviewed by me and considered in my medical decision making (see chart for details).       Virginia [name] is a 43 y.o. female who presents to ED for sore throat, vaginal discharge and dysuria. Subjective fever and chills at home.  Afebrile and hemodynamically stable on exam. OP with erythema and  tonsillar hypertrophy. Strep +. GU exam with copious vaginal discharge concerning for PID. No adnexal tenderness to suggest TOA. Wet prep swab was completely normal, but given her physical exam, I am concerned about accuracy of wet prep and will treat for PID. She is having dysuria. At shift change, UA is still pending, however by history feel as is she will need treatment for UTI as well. Discussed with pharmacist who recommends combination of doxy and keflex to treat combination of PID, strep and UTI/pyelo. Care assumed by oncoming provider PA Humes. Case discussed, plan agreed upon. Lactic and UA/ucx pending. If lactic reassuring, anticipate discharge to home with ABX. Hx of yeast infections with ABX so diflucan sent as well.   Patient discussed with Dr. Elesa MassedWard who agrees with treatment plan.    Final Clinical Impressions(s) / ED Diagnoses   Final diagnoses:  PID (acute pelvic inflammatory disease)  Hyperglycemia  Strep pharyngitis    ED Discharge Orders         Ordered    cephALEXin (KEFLEX) 500 MG capsule  4 times daily     07/13/19 0059    doxycycline (VIBRAMYCIN) 100 MG capsule  2 times daily     07/13/19 0059    fluconazole (DIFLUCAN) 150 MG tablet  Daily     07/13/19 0059    ondansetron (ZOFRAN ODT) 4 MG disintegrating tablet  Every 8 hours PRN     07/13/19 0059    HYDROcodone-acetaminophen (NORCO/VICODIN) 5-325 MG tablet  Every 6 hours PRN,   Status:  Discontinued     07/13/19 0059    HYDROcodone-acetaminophen (NORCO/VICODIN) 5-325 MG tablet  Every 6 hours PRN     07/13/19 0104           Kemisha Bonnette, Chase PicketJaime Pilcher, PA-C 07/13/19 0111    Alexei Ey, Layla MawKristen N, DO 07/13/19 0118

## 2019-07-14 LAB — GC/CHLAMYDIA PROBE AMP (~~LOC~~) NOT AT ARMC
Chlamydia: NEGATIVE
Neisseria Gonorrhea: NEGATIVE

## 2019-07-15 LAB — URINE CULTURE: Culture: >=100000 — AB

## 2019-07-16 ENCOUNTER — Telehealth: Payer: Self-pay | Admitting: *Deleted

## 2019-07-16 NOTE — Telephone Encounter (Signed)
Post ED Visit - Positive Culture Follow-up  Culture report reviewed by antimicrobial stewardship pharmacist: [facility] Team []  Elenor Quinones, Pharm.D. []  Heide Guile, Pharm.D., BCPS AQ-ID []  Parks Neptune, Pharm.D., BCPS []  Alycia Rossetti, Pharm.D., BCPS []  [place], Pharm.D., BCPS, AAHIVP []  Legrand Como, Pharm.D., BCPS, AAHIVP []  Salome Arnt, PharmD, BCPS []  Johnnette Gourd, PharmD, BCPS []  Hughes Better, PharmD, BCPS []  Leeroy Cha, PharmD []  Laqueta Linden, PharmD, BCPS []  Albertina Parr, PharmD  [facility] Team []  Leodis Sias, PharmD []  Lindell Spar, PharmD []  Royetta Asal, PharmD []  Graylin Shiver, Rph []  Rema Fendt) Glennon Mac, PharmD []  Arlyn Dunning, PharmD []  Netta Cedars, PharmD []  Dia Sitter, PharmD []  Leone Haven, PharmD []  Gretta Arab, PharmD []  Theodis Shove, PharmD []  Peggyann Juba, PharmD [x]  Reuel Boom, PharmD   Positive urine culture Treated with Cephalexin, organism sensitive to the same and no further patient follow-up is required at this time.  Harlon Flor Talley 07/16/2019, 1:40 PM

## 2019-07-18 LAB — CULTURE, BLOOD (ROUTINE X 2)
Culture: NO GROWTH
Culture: NO GROWTH
Special Requests: ADEQUATE

## 2020-03-27 ENCOUNTER — Emergency Department (HOSPITAL_COMMUNITY)
Admission: EM | Admit: 2020-03-27 | Discharge: 2020-03-28 | Disposition: A | Discharge status: Home / Self Care | Payer: Commercial Managed Care - PPO | Attending: Emergency Medicine | Admitting: Emergency Medicine

## 2020-03-27 ENCOUNTER — Emergency Department (HOSPITAL_COMMUNITY): Payer: Commercial Managed Care - PPO

## 2020-03-27 ENCOUNTER — Other Ambulatory Visit: Payer: Self-pay

## 2020-03-27 DIAGNOSIS — R519 Headache, unspecified: Secondary | ICD-10-CM | POA: Diagnosis not present

## 2020-03-27 DIAGNOSIS — I10 Essential (primary) hypertension: Secondary | ICD-10-CM | POA: Diagnosis not present

## 2020-03-27 DIAGNOSIS — J069 Acute upper respiratory infection, unspecified: Secondary | ICD-10-CM | POA: Insufficient documentation

## 2020-03-27 DIAGNOSIS — Z79899 Other long term (current) drug therapy: Secondary | ICD-10-CM | POA: Insufficient documentation

## 2020-03-27 DIAGNOSIS — Z87891 Personal history of nicotine dependence: Secondary | ICD-10-CM | POA: Insufficient documentation

## 2020-03-27 DIAGNOSIS — Z794 Long term (current) use of insulin: Secondary | ICD-10-CM | POA: Diagnosis not present

## 2020-03-27 DIAGNOSIS — R05 Cough: Secondary | ICD-10-CM | POA: Insufficient documentation

## 2020-03-27 DIAGNOSIS — E119 Type 2 diabetes mellitus without complications: Secondary | ICD-10-CM | POA: Diagnosis not present

## 2020-03-27 DIAGNOSIS — Z20822 Contact with and (suspected) exposure to covid-19: Secondary | ICD-10-CM | POA: Diagnosis not present

## 2020-03-27 DIAGNOSIS — R0602 Shortness of breath: Secondary | ICD-10-CM | POA: Diagnosis not present

## 2020-03-27 DIAGNOSIS — M7918 Myalgia, other site: Secondary | ICD-10-CM | POA: Diagnosis present

## 2020-03-27 DIAGNOSIS — N390 Urinary tract infection, site not specified: Secondary | ICD-10-CM | POA: Diagnosis not present

## 2020-03-27 LAB — COMPREHENSIVE METABOLIC PANEL
ALT: 17 U/L (ref 0–44)
AST: 16 U/L (ref 15–41)
Albumin: 2.3 g/dL — ABNORMAL LOW (ref 3.5–5.0)
Alkaline Phosphatase: 145 U/L — ABNORMAL HIGH (ref 38–126)
Anion gap: 10 (ref 5–15)
BUN: 12 mg/dL (ref 6–20)
CO2: 21 mmol/L — ABNORMAL LOW (ref 22–32)
Calcium: 7.2 mg/dL — ABNORMAL LOW (ref 8.9–10.3)
Chloride: 109 mmol/L (ref 98–111)
Creatinine, Ser: 1.05 mg/dL — ABNORMAL HIGH (ref 0.44–1.00)
GFR calc Af Amer: >60 mL/min (ref >60)
GFR calc non Af Amer: >60 mL/min (ref >60)
Glucose, Bld: 218 mg/dL — ABNORMAL HIGH (ref 70–99)
Potassium: 4 mmol/L (ref 3.5–5.1)
Sodium: 140 mmol/L (ref 135–145)
Total Bilirubin: 0.5 mg/dL (ref 0.3–1.2)
Total Protein: 6.4 g/dL — ABNORMAL LOW (ref 6.5–8.1)

## 2020-03-27 LAB — LACTIC ACID, PLASMA: Lactic Acid, Venous: 1.4 mmol/L (ref 0.5–1.9)

## 2020-03-27 LAB — CBC WITH DIFFERENTIAL/PLATELET
Abs Immature Granulocytes: 0.05 10*3/uL (ref 0.00–0.07)
Basophils Absolute: 0 10*3/uL (ref 0.0–0.1)
Basophils Relative: 0 %
Eosinophils Absolute: 0.3 10*3/uL (ref 0.0–0.5)
Eosinophils Relative: 3 %
HCT: 28.5 % — ABNORMAL LOW (ref 36.0–46.0)
Hemoglobin: 8.7 g/dL — ABNORMAL LOW (ref 12.0–15.0)
Immature Granulocytes: 1 %
Lymphocytes Relative: 21 %
Lymphs Abs: 2.1 10*3/uL (ref 0.7–4.0)
MCH: 26.4 pg (ref 26.0–34.0)
MCHC: 30.5 g/dL (ref 30.0–36.0)
MCV: 86.4 fL (ref 80.0–100.0)
Monocytes Absolute: 1.1 10*3/uL — ABNORMAL HIGH (ref 0.1–1.0)
Monocytes Relative: 11 %
Neutro Abs: 6.5 10*3/uL (ref 1.7–7.7)
Neutrophils Relative %: 64 %
Platelets: 207 10*3/uL (ref 150–400)
RBC: 3.3 MIL/uL — ABNORMAL LOW (ref 3.87–5.11)
RDW: 16.1 % — ABNORMAL HIGH (ref 11.5–15.5)
WBC: 10.1 10*3/uL (ref 4.0–10.5)
nRBC: 0 % (ref 0.0–0.2)

## 2020-03-27 LAB — URINALYSIS, ROUTINE W REFLEX MICROSCOPIC
Bilirubin Urine: NEGATIVE
Glucose, UA: 50 mg/dL — AB
Hgb urine dipstick: NEGATIVE
Ketones, ur: NEGATIVE mg/dL
Nitrite: NEGATIVE
Protein, ur: 100 mg/dL — AB
Specific Gravity, Urine: 1.012 (ref 1.005–1.030)
WBC, UA: >50 WBC/hpf — ABNORMAL HIGH (ref 0–5)
pH: 6 (ref 5.0–8.0)

## 2020-03-27 LAB — CBG MONITORING, ED: Glucose-Capillary: 219 mg/dL — ABNORMAL HIGH (ref 70–99)

## 2020-03-27 LAB — POC SARS CORONAVIRUS 2 AG -  ED: SARS Coronavirus 2 Ag: NEGATIVE

## 2020-03-27 LAB — I-STAT BETA HCG BLOOD, ED (MC, WL, AP ONLY): I-stat hCG, quantitative: <5 m[IU]/mL (ref <5)

## 2020-03-27 MED ORDER — MORPHINE SULFATE (PF) 4 MG/ML IV SOLN
4.0000 mg | Freq: Once | INTRAVENOUS | Status: AC
  Administered 2020-03-27: 20:00:00 4 mg via INTRAVENOUS
  Filled 2020-03-27: qty 1

## 2020-03-27 MED ORDER — SODIUM CHLORIDE 0.9 % IV BOLUS
1000.0000 mL | Freq: Once | INTRAVENOUS | Status: AC
  Administered 2020-03-27: 20:00:00 1000 mL via INTRAVENOUS

## 2020-03-27 MED ORDER — LORAZEPAM 2 MG/ML IJ SOLN
1.0000 mg | Freq: Once | INTRAMUSCULAR | Status: AC
  Administered 2020-03-27: 21:00:00 1 mg via INTRAVENOUS
  Filled 2020-03-27: qty 1

## 2020-03-27 MED ORDER — SULFAMETHOXAZOLE-TRIMETHOPRIM 800-160 MG PO TABS: 1.0000 | ORAL_TABLET | Freq: Two times a day (BID) | ORAL | 0 refills | Status: AC

## 2020-03-27 MED ORDER — SULFAMETHOXAZOLE-TRIMETHOPRIM 800-160 MG PO TABS
1.0000 | ORAL_TABLET | Freq: Once | ORAL | Status: AC
  Administered 2020-03-28: 1 via ORAL
  Filled 2020-03-27: qty 1

## 2020-03-27 NOTE — ED Provider Notes (Signed)
[facility] EMERGENCY DEPARTMENT Provider Note   CSN: [id number] Arrival date & time: 03/27/20  1737     History Chief Complaint  Patient presents with  . Generalized Body Aches  . Nasal Congestion    Virginia [name] is a 44 y.o. female.  She is complaining of subjective fevers chills along with body aches sinus congestion fatigue nonproductive cough chest pain diarrhea urinary symptoms of dysuria and frequency along with low back pain.  Reportedly was unresponsive getting out of the car to the emergency department but is awake and alert here.  No sick contacts or recent travel.  No recent Covid testing.  The history is provided by the patient.  Influenza Presenting symptoms: cough, diarrhea, fatigue, fever, headache, myalgias, nausea, rhinorrhea, shortness of breath and sore throat   Fatigue:    Severity:  Severe   Duration:  1 week   Timing:  Constant   Progression:  Unchanged Rhinorrhea:    Quality:  Bloody   Severity:  Moderate   Duration:  1 week   Timing:  Intermittent   Progression:  Unchanged Associated symptoms: chills, decreased appetite and syncope (maybe)   Associated symptoms: no neck stiffness        Past Medical History:  Diagnosis Date  . Anxiety   . Arthritis   . Bipolar affective ([place])   . Depression   . Diabetes mellitus   . Hypertension     Patient Active Problem List   Diagnosis Date Noted  . Urinary tract infection without hematuria   . Dehydration   . Gastroesophageal reflux disease   . Class 2 obesity due to excess calories with body mass index (BMI) of 39.0 to 39.9 in adult   . Hypokalemia   . Hypomagnesemia   . Anxiety 02/20/2017  . Diabetes mellitus without complication ([place]) 50/93/2671  . Sepsis ([place]) 02/20/2017  . Abdominal pain 02/20/2017  . Nausea & vomiting 02/20/2017  . Tobacco abuse 02/20/2017  . Flank pain 02/20/2017  . Hypertension   . Lactic acidosis     Past Surgical History:  Procedure  Laterality Date  . CESAREAN SECTION    . CHOLECYSTECTOMY    . GASTRIC BYPASS    . KNEE SURGERY       OB History   No obstetric history on file.     No family history on file.  Social History   Tobacco Use  . Smoking status: Former Smoker    Packs/day: 0.50    Types: Cigarettes    Quit date: 02/20/2013    Years since quitting: 7.1  . Smokeless tobacco: Never Used  Substance Use Topics  . Alcohol use: Yes    Comment: occasional  . Drug use: No    Home Medications Prior to Admission medications   Medication Sig Start Date End Date Taking? Authorizing Provider  ALPRAZolam Duanne Moron) 1 MG tablet Take 1 mg by mouth 4 (four) times daily as needed for anxiety or sleep.  02/26/20  Yes [provider]  amphetamine-dextroamphetamine (ADDERALL) 20 MG tablet Take 20 mg by mouth 2 (two) times daily. 02/26/20  Yes [provider]  FLUoxetine (PROZAC) 40 MG capsule Take 80 mg by mouth daily.  12/21/19  Yes [provider]  albuterol (PROAIR HFA) 108 (90 Base) MCG/ACT inhaler Inhale 2 puffs every 4 (four) hours as needed into the lungs for wheezing or shortness of breath.    [provider]  calcium carbonate (TUMS - DOSED IN MG ELEMENTAL CALCIUM)  500 MG chewable tablet Chew 3,000 mg daily as needed by mouth for indigestion or heartburn.    [provider]  cephALEXin (KEFLEX) 500 MG capsule Take 1 capsule (500 mg total) by mouth 4 (four) times daily. 07/13/19   Ward, Ozella Almond, PA-C  clonazePAM (KLONOPIN) 0.5 MG tablet Take 0.5 mg 2 (two) times daily by mouth. 09/23/17   [provider]  cyclobenzaprine (FLEXERIL) 10 MG tablet Take 10 mg at bedtime by mouth. 09/23/17   [provider]  dimenhyDRINATE (DRAMAMINE) 50 MG tablet Take 50 mg every 6 (six) hours as needed by mouth for nausea.    [provider]  doxycycline (VIBRAMYCIN) 100 MG capsule Take 1 capsule (100 mg total) by mouth 2 (two) times daily. 07/13/19   Ward, Ozella Almond, PA-C  FLUoxetine (PROZAC) 20 MG capsule Take 20 mg daily by mouth. 09/10/17   [provider]  HYDROcodone-acetaminophen (NORCO/VICODIN) 5-325 MG tablet Take 1 tablet by mouth every 6 (six) hours as needed for severe pain. 07/13/19   Ward, Ozella Almond, PA-C  insulin glargine (LANTUS) 100 UNIT/ML injection Inject 18 Units into the skin at bedtime.     [provider]  losartan (COZAAR) 50 MG tablet Take 50 mg daily by mouth. 08/09/17   [provider]  metoprolol tartrate (LOPRESSOR) 50 MG tablet Take 50 mg 2 (two) times daily by mouth.    [provider]  Multiple Vitamin (MULTIVITAMIN WITH MINERALS) TABS tablet Take 1 tablet daily by mouth.    [provider]  multivitamin-iron-minerals-folic acid (CENTRUM) chewable tablet Chew 1 tablet by mouth daily. Patient not taking: Reported on 10/19/2017 02/22/17   Barton Dubois, MD  omeprazole (PRILOSEC) 40 MG capsule Take 40 mg daily by mouth. 07/12/17   [provider]  ondansetron (ZOFRAN ODT) 4 MG disintegrating tablet Take 1 tablet (4 mg total) by mouth every 8 (eight) hours as needed for nausea or vomiting. 07/13/19   Ward, Ozella Almond, PA-C  Probiotic Product (PROBIOTIC PO) Take 1 capsule daily by mouth.    [provider]  promethazine (PHENERGAN) 25 MG suppository Place 25 mg every 6 (six) hours as needed rectally for nausea or vomiting.    [provider]  promethazine (PHENERGAN) 25 MG tablet Take 1 tablet (25 mg total) by mouth every 6 (six) hours as needed for nausea. Patient not taking: Reported on 10/19/2017 05/16/17   Clayton Bibles, PA-C  ranitidine (ZANTAC) 150 MG tablet Take 150 mg daily as needed by mouth for heartburn (nausea).    [provider]  sulfamethoxazole-trimethoprim (BACTRIM DS) 800-160 MG tablet Take 1 tablet by mouth 2 (two) times daily for 7 days. 03/27/20 04/03/20  Ward, Delice Bison, DO  vitamin B-12 (CYANOCOBALAMIN) 1000 MCG tablet Take 1  tablet (1,000 mcg total) by mouth daily. Patient not taking: Reported on 10/19/2017 02/22/17   Barton Dubois, MD    Allergies    Ace inhibitors, Ace inhibitors, Lamictal [lamotrigine], and Nsaids  Review of Systems   Review of Systems  Constitutional: Positive for appetite change, chills, decreased appetite, fatigue and fever.  HENT: Positive for rhinorrhea, sinus pressure and sore throat.   Eyes: Negative for visual disturbance.  Respiratory: Positive for cough and shortness of breath.   Cardiovascular: Positive for chest pain.  Gastrointestinal: Positive for diarrhea and nausea.  Genitourinary: Positive for dysuria.  Musculoskeletal: Positive for myalgias. Negative for neck stiffness.  Skin: Negative for rash.  Neurological: Positive for headaches.    Physical Exam  Updated Vital Signs BP (!) 162/89   Pulse 81   Temp 98.6 F (37 C) (Oral)   Resp 18   SpO2 100%   Physical Exam Vitals and nursing note reviewed.  Constitutional:      General: She is not in acute distress.    Appearance: Normal appearance. She is well-developed.  HENT:     Head: Normocephalic and atraumatic.     Mouth/Throat:     Mouth: Mucous membranes are moist.     Pharynx: Oropharynx is clear.  Eyes:     Conjunctiva/sclera: Conjunctivae normal.  Cardiovascular:     Rate and Rhythm: Normal rate and regular rhythm.     Heart sounds: No murmur.  Pulmonary:     Effort: Pulmonary effort is normal. No respiratory distress.     Breath sounds: Normal breath sounds.  Abdominal:     Palpations: Abdomen is soft.     Tenderness: There is no abdominal tenderness.  Musculoskeletal:        General: No deformity or signs of injury. Normal range of motion.     Cervical back: Neck supple.     Right lower leg: No edema.     Left lower leg: No edema.  Skin:    General: Skin is warm and dry.     Capillary Refill: Capillary refill takes less than 2 seconds.  Neurological:     General: No focal deficit present.       Mental Status: She is alert and oriented to person, place, and time.     Sensory: No sensory deficit.     Motor: No weakness.     ED Results / Procedures / Treatments   Labs (all labs ordered are listed, but only abnormal results are displayed) Labs Reviewed  COMPREHENSIVE METABOLIC PANEL - Abnormal; Notable for the following components:      Result Value   CO2 21 (*)    Glucose, Bld 218 (*)    Creatinine, Ser 1.05 (*)    Calcium 7.2 (*)    Total Protein 6.4 (*)    Albumin 2.3 (*)    Alkaline Phosphatase 145 (*)    All other components within normal limits  CBC WITH DIFFERENTIAL/PLATELET - Abnormal; Notable for the following components:   RBC 3.30 (*)    Hemoglobin 8.7 (*)    HCT 28.5 (*)    RDW 16.1 (*)    Monocytes Absolute 1.1 (*)    All other components within normal limits  URINALYSIS, ROUTINE W REFLEX MICROSCOPIC - Abnormal; Notable for the following components:   APPearance CLOUDY (*)    Glucose, UA 50 (*)    Protein, ur 100 (*)    Leukocytes,Ua LARGE (*)    WBC, UA >50 (*)    Bacteria, UA RARE (*)    All other components within normal limits  CBG MONITORING, ED - Abnormal; Notable for the following components:   Glucose-Capillary 219 (*)    All other components within normal limits  SARS CORONAVIRUS 2 (TAT 6-24 HRS)  URINE CULTURE  LACTIC ACID, PLASMA  POC SARS CORONAVIRUS 2 AG -  ED  I-STAT BETA HCG BLOOD, ED ([facility], [facility], [facility] ONLY)    EKG EKG Interpretation  Date/Time:  Thursday March 27 2020 18:21:51 EDT Ventricular Rate:  83 PR Interval:    QRS Duration: 131 QT Interval:  410 QTC Calculation: 482 R Axis:   98 Text Interpretation: Sinus rhythm Borderline short PR interval Right bundle branch block No old tracing to  compare Confirmed by Aletta Edouard [phone]) on 03/27/2020 6:27:04 PM Also confirmed by Aletta Edouard [phone]), editor Gean Quint [phone])  on 03/28/2020 9:23:02 AM   Radiology DG Chest Port 1 View  Result Date: 03/27/2020 CLINICAL  DATA:  Cough EXAM: PORTABLE CHEST 1 VIEW COMPARISON:  04/13/2017 FINDINGS: The heart size and mediastinal contours are within normal limits. Both lungs are clear. The visualized skeletal structures are unremarkable. IMPRESSION: No acute abnormality of the lungs in AP portable projection. Electronically Signed   By: Eddie Candle M.D.   On: 03/27/2020 18:36    Procedures Procedures (including critical care time)  Medications Ordered in ED Medications  sodium chloride 0.9 % bolus 1,000 mL (0 mLs Intravenous Stopped 03/27/20 2200)  morphine 4 MG/ML injection 4 mg (4 mg Intravenous Given 03/27/20 1947)  LORazepam (ATIVAN) injection 1 mg (1 mg Intravenous Given 03/27/20 2100)  sulfamethoxazole-trimethoprim (BACTRIM DS) 800-160 MG per tablet 1 tablet (1 tablet Oral Given 03/28/20 0005)    ED Course  I have reviewed the triage vital signs and the nursing notes.  Pertinent labs & imaging results that were available during my care of the patient were reviewed by me and considered in my medical decision making (see chart for details).  Clinical Course as of Mar 28 1010  Thu Mar 27, 2020  [place] Chest x-ray interpreted by me as no gross infiltrates.   [MB]  2006 Any trouble reconciling the patient's medical history and not finding any prior labs.  I reviewed this with her and she said she has been a calm patient for a long time.  Apparently when she came in unresponsive they opened up a trauma packet on her in the patient has emerged with her old record.   [MB]  2007 Patient's labs showing a low hemoglobin of 8.7.  The last one we had on her last year was 74.  Her glucose is also elevated but she is diabetic and at baseline for her.  Calcium is lower than last time at 7.2.  Her alk phos was elevated during the last visit and this is actually better than the last time.   [MB]  2007 Patient's calcium corrected for her albumin would be 8.6   [MB]  2224 Vernard Gambles is feeling better now after some fluids and  medication.  She showed me dry patch on her scalp.  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  I do not feel any fluctuance now and so do not think it needs to be lanced but we may put her on antibiotics.  Waiting her urine.   [MB]    Clinical Course User Index [MB] Hayden Rasmussen, MD   MDM Rules/Calculators/A&P                     [place] was evaluated in Emergency Department on 03/27/2020 for the symptoms described in the history of present illness. She was evaluated in the context of the global COVID-19 pandemic, which necessitated consideration that the patient might be at risk for infection with the SARS-CoV-2 virus that causes COVID-19. Institutional protocols and algorithms that pertain to the evaluation of patients at risk for COVID-19 are in a state of rapid change based on information released by regulatory bodies including the CDC and federal and state organizations. These policies and algorithms were followed during the patient's care in the ED.  This patient complains of ejective fevers URI symptoms GI symptoms urinary symptoms myalgias; this involves  an extensive number of treatment Options and is a complaint that carries with it a high risk of complications and Morbidity. The differential includes Covid, flu, UTI, pyelonephritis, pneumonia, sinusitis  I ordered, reviewed and interpreted labs, which included normal white count normal lactate.  Hemoglobin lower than baseline chemistry showing elevated glucose, elevation in creatinine.  Pregnancy test negative Covid test negative.  Urinalysis consistent with UTI greater than 50 whites. I ordered medication pain medication antianxiety medication IV fluids and antibiotics I ordered imaging studies which included chest x-ray and I independently    visualized and interpreted imaging which showed no infiltrates Additional history obtained from patient significant other Previous records obtained and reviewed in  epic   After the interventions stated above, I reevaluated the patient and found patient's vital signs to be improved and her symptoms well controlled.  She is comfortable with plan for antibiotics and outpatient follow-up with doctors.  Return instructions discussed   Final Clinical Impression(s) / ED Diagnoses Final diagnoses:  Acute urinary tract infection  Viral upper respiratory tract infection    Rx / DC Orders ED Discharge Orders         Ordered    sulfamethoxazole-trimethoprim (BACTRIM DS) 800-160 MG tablet  2 times daily     03/27/20 2345           Hayden Rasmussen, MD 03/28/20 1014

## 2020-03-27 NOTE — ED Notes (Signed)
Dr Charm Barges informed pt POC covid test neg

## 2020-03-27 NOTE — ED Provider Notes (Signed)
11:45 PM  Assumed care from Dr. Charm Barges.  44 year old female with URI symptoms as well as urinary symptoms.  Labs, urine, chest x-ray reviewed/interpreted.  Urine appears infected.  Culture pending.  She reports Cipro and Bactrim have helped previously.  Lactate normal.  No leukocytosis.  Vitals normal currently.  Chest x-ray clear.  Covid pending.  She will quarantine until these test results are back.  Discussed supportive care instructions.  Patient well-appearing currently.  Will be discharged home.  She is comfortable with this plan.  At this time, I do not feel there is any life-threatening condition present. I have reviewed, interpreted and discussed all results (EKG, imaging, lab, urine as appropriate) and exam findings with patient/family. I have reviewed nursing notes and appropriate previous records.  I feel the patient is safe to be discharged home without further emergent workup and can continue workup as an outpatient as needed. Discussed usual and customary return precautions. Patient/family verbalize understanding and are comfortable with this plan.  Outpatient follow-up has been provided as needed. All questions have been answered.    Mica Ramdass, Layla Maw, DO 03/27/20 2347

## 2020-03-27 NOTE — Discharge Instructions (Signed)
You may alternate Tylenol 1000 mg every 6 hours as needed for pain, fever and Ibuprofen 800 mg every 8 hours as needed for pain, fever.  Please take Ibuprofen with food.  Do not take more than 4000 mg of Tylenol (acetaminophen) in a 24 hour period.  Please quarantine until your Covid test results come back.  This will take approximately 1 day.  You may follow-up on these results through MyChart.  If you are positive for Covid, you will need to quarantine for at least 10 full days after the onset of symptoms and have your symptoms will need to be improving and you need to be fever free for at least 3 full days before coming out of quarantine.

## 2020-03-27 NOTE — ED Triage Notes (Signed)
Pt bib friend with reports of "unresponsive" however pt able to provide all history. Pt reports generalized body aches and nasal congestion. Pt also says she has been tired. Pt in NAD.

## 2020-03-28 LAB — SARS CORONAVIRUS 2 (TAT 6-24 HRS): SARS Coronavirus 2: NEGATIVE

## 2020-03-28 LAB — URINE CULTURE

## 2020-05-23 ENCOUNTER — Encounter (HOSPITAL_COMMUNITY): Payer: Self-pay | Admitting: Emergency Medicine

## 2020-05-23 ENCOUNTER — Other Ambulatory Visit: Payer: Self-pay

## 2020-05-23 ENCOUNTER — Emergency Department (HOSPITAL_COMMUNITY)
Admission: EM | Admit: 2020-05-23 | Discharge: 2020-05-24 | Disposition: A | Discharge status: Home / Self Care | Payer: Commercial Managed Care - PPO | Attending: Emergency Medicine | Admitting: Emergency Medicine

## 2020-05-23 ENCOUNTER — Emergency Department (HOSPITAL_COMMUNITY): Payer: Commercial Managed Care - PPO

## 2020-05-23 DIAGNOSIS — Z87891 Personal history of nicotine dependence: Secondary | ICD-10-CM | POA: Insufficient documentation

## 2020-05-23 DIAGNOSIS — R55 Syncope and collapse: Secondary | ICD-10-CM | POA: Diagnosis not present

## 2020-05-23 DIAGNOSIS — X58XXXA Exposure to other specified factors, initial encounter: Secondary | ICD-10-CM | POA: Diagnosis not present

## 2020-05-23 DIAGNOSIS — S60419A Abrasion of unspecified finger, initial encounter: Secondary | ICD-10-CM

## 2020-05-23 DIAGNOSIS — E119 Type 2 diabetes mellitus without complications: Secondary | ICD-10-CM | POA: Diagnosis not present

## 2020-05-23 DIAGNOSIS — Y929 Unspecified place or not applicable: Secondary | ICD-10-CM | POA: Diagnosis not present

## 2020-05-23 DIAGNOSIS — S60410A Abrasion of right index finger, initial encounter: Secondary | ICD-10-CM | POA: Insufficient documentation

## 2020-05-23 DIAGNOSIS — Y939 Activity, unspecified: Secondary | ICD-10-CM | POA: Diagnosis not present

## 2020-05-23 DIAGNOSIS — Y999 Unspecified external cause status: Secondary | ICD-10-CM | POA: Insufficient documentation

## 2020-05-23 DIAGNOSIS — I10 Essential (primary) hypertension: Secondary | ICD-10-CM | POA: Diagnosis not present

## 2020-05-23 DIAGNOSIS — L089 Local infection of the skin and subcutaneous tissue, unspecified: Secondary | ICD-10-CM | POA: Diagnosis not present

## 2020-05-23 DIAGNOSIS — S65301A Unspecified injury of deep palmar arch of right hand, initial encounter: Secondary | ICD-10-CM | POA: Diagnosis present

## 2020-05-23 MED ORDER — CEPHALEXIN 500 MG PO CAPS: 500.0000 mg | ORAL_CAPSULE | Freq: Four times a day (QID) | ORAL | 0 refills | Status: AC

## 2020-05-23 NOTE — ED Triage Notes (Signed)
Patient reports right index finger pain with mild swelling onset this week , denies injury .

## 2020-05-23 NOTE — Discharge Instructions (Addendum)
You were evaluated in the Emergency Department and after careful evaluation, we did not find any emergent condition requiring admission or further testing in the hospital.  Your exam/testing today is overall reassuring.  Your symptoms seem to be due to a finger infection.  Please take the antibiotics as directed.  Please return to the Emergency Department if you experience any worsening of your condition.  We encourage you to follow up with a primary care provider.  Thank you for allowing [place] to be a part of your care.

## 2020-05-23 NOTE — ED Provider Notes (Addendum)
[facility] Emergency Department Provider Note MRN:  [id number]  Arrival date & time: 05/23/20     Chief Complaint   Finger Pain   History of Present Illness   Virginia [name] is a 44 y.o. year-old female with a history of diabetes, presenting to the ED with chief complaint of syncope.  Patient has been having worsening finger pain over the past week.  She thinks that she cut the finger at some point but does not remember how.  Had some fever earlier in the week but was also having cold-like symptoms.  The symptoms are improving.  The pain in the finger is mild to moderate, worse with motion and palpation.  Review of Systems  A complete 10 system review of systems was obtained and all systems are negative except as noted in the HPI and PMH.   Patient's Health History    Past Medical History:  Diagnosis Date  . Anxiety   . Arthritis   . Bipolar affective ([facility])   . Depression   . Diabetes mellitus   . Hypertension     Past Surgical History:  Procedure Laterality Date  . CESAREAN SECTION    . CHOLECYSTECTOMY    . GASTRIC BYPASS    . KNEE SURGERY      Family History  Problem Relation Age of Onset  . Asthma Mother   . Hypertension Mother   . Hypertension Father   . Diabetes Mellitus II Father     Social History   Socioeconomic History  . Marital status: Married    Spouse name: Not on file  . Number of children: Not on file  . Years of education: Not on file  . Highest education level: Not on file  Occupational History  . Not on file  Tobacco Use  . Smoking status: Former Smoker    Packs/day: 0.50    Types: Cigarettes    Quit date: 02/20/2013    Years since quitting: 7.2  . Smokeless tobacco: Never Used  Substance and Sexual Activity  . Alcohol use: Yes    Comment: occasional  . Drug use: No  . Sexual activity: Yes    Birth control/protection: Condom  Other Topics Concern  . Not on file  Social History Narrative  . Not on  file   Social Determinants of Health   Financial Resource Strain:   . Difficulty of Paying Living Expenses:   Food Insecurity:   . Worried About Programme researcher, broadcasting/film/video in the Last Year:   . Barista in the Last Year:   Transportation Needs:   . Freight forwarder (Medical):   Marland Kitchen Lack of Transportation (Non-Medical):   Physical Activity:   . Days of Exercise per Week:   . Minutes of Exercise per Session:   Stress:   . Feeling of Stress :   Social Connections:   . Frequency of Communication with Friends and Family:   . Frequency of Social Gatherings with Friends and Family:   . Attends Religious Services:   . Active Member of Clubs or Organizations:   . Attends Banker Meetings:   Marland Kitchen Marital Status:   Intimate Partner Violence:   . Fear of Current or Ex-Partner:   . Emotionally Abused:   Marland Kitchen Physically Abused:   . Sexually Abused:      Physical Exam   Vitals:   05/23/20 2021  BP: (!) 170/97  Pulse: (!) 105  Resp: 14  Temp: 98.9  F (37.2 C)  SpO2: 99%    CONSTITUTIONAL: Well-appearing, NAD NEURO:  Alert and oriented x 3, no focal deficits EYES:  eyes equal and reactive ENT/NECK:  no LAD, no JVD CARDIO: Regular rate, well-perfused, normal S1 and S2 PULM:  CTAB no wheezing or rhonchi GI/GU:  normal bowel sounds, non-distended, non-tender MSK/SPINE:  No gross deformities, no edema SKIN: Superficial healing laceration to the radial lateral aspect of the right pointer finger at the level of the proximal phalange, mild surrounding erythema, tender to palpation PSYCH:  Appropriate speech and behavior  *Additional and/or pertinent findings included in MDM below  Diagnostic and Interventional Summary    EKG Interpretation  Date/Time:    Ventricular Rate:    PR Interval:    QRS Duration:   QT Interval:    QTC Calculation:   R Axis:     Text Interpretation:        Labs Reviewed - No data to display  DG Finger Index Right  Final Result        Medications - No data to display   Procedures  /  Critical Care Procedures  ED Course and Medical Decision Making  I have reviewed the triage vital signs, the nursing notes, and pertinent available records from the EMR.  Listed above are laboratory and imaging tests that I personally ordered, reviewed, and interpreted and then considered in my medical decision making (see below for details).     Seems consistent with infection of the soft tissues of the finger after recent small laceration.  She denies any possibility of foreign body and I do not palpate any foreign body.  The x-ray is normal and there is no foreign body there.  She is appropriate for trial of antibiotics and strict return precautions.  The hand and finger are neurovascularly intact with largely preserved range of motion but this does elicit some pain.  This is inconsistent with flexor tenosynovitis or any other type of deep space hand infection.  Barth Kirks. [place] Small, [place] [email or web address]  Final Clinical Impressions(s) / ED Diagnoses     ICD-10-CM   1. Abrasion of finger with infection, initial encounter  S60.419A    L08.9     ED Discharge Orders         Ordered    cephALEXin (KEFLEX) 500 MG capsule  4 times daily     Discontinue  Reprint     05/23/20 2318           Discharge Instructions Discussed with and Provided to Patient:     Discharge Instructions     You were evaluated in the Emergency Department and after careful evaluation, we did not find any emergent condition requiring admission or further testing in the hospital.  Your exam/testing today is overall reassuring.  Your symptoms seem to be due to a finger infection.  Please take the antibiotics as directed.  Please return to the Emergency Department if you experience any worsening of your condition.  We encourage you to follow up with a primary care provider.  Thank you for allowing [place] to  be a part of your care.      Maudie Flakes, MD 05/23/20 9381    Maudie Flakes, MD 05/23/20 [phone]

## 2020-12-02 ENCOUNTER — Other Ambulatory Visit: Payer: Commercial Managed Care - PPO

## 2021-06-25 ENCOUNTER — Encounter (HOSPITAL_COMMUNITY): Payer: Self-pay | Admitting: *Deleted

## 2021-06-25 ENCOUNTER — Emergency Department (HOSPITAL_COMMUNITY)
Admission: EM | Admit: 2021-06-25 | Discharge: 2021-06-25 | Disposition: A | Discharge status: Home / Self Care | Payer: Commercial Managed Care - PPO | Attending: Emergency Medicine | Admitting: Emergency Medicine

## 2021-06-25 ENCOUNTER — Other Ambulatory Visit: Payer: Self-pay

## 2021-06-25 DIAGNOSIS — I10 Essential (primary) hypertension: Secondary | ICD-10-CM

## 2021-06-25 DIAGNOSIS — R739 Hyperglycemia, unspecified: Secondary | ICD-10-CM

## 2021-06-25 DIAGNOSIS — E1165 Type 2 diabetes mellitus with hyperglycemia: Secondary | ICD-10-CM | POA: Insufficient documentation

## 2021-06-25 DIAGNOSIS — Z7984 Long term (current) use of oral hypoglycemic drugs: Secondary | ICD-10-CM | POA: Insufficient documentation

## 2021-06-25 DIAGNOSIS — Z79899 Other long term (current) drug therapy: Secondary | ICD-10-CM | POA: Insufficient documentation

## 2021-06-25 DIAGNOSIS — Z794 Long term (current) use of insulin: Secondary | ICD-10-CM | POA: Insufficient documentation

## 2021-06-25 DIAGNOSIS — Z87891 Personal history of nicotine dependence: Secondary | ICD-10-CM | POA: Insufficient documentation

## 2021-06-25 DIAGNOSIS — L02411 Cutaneous abscess of right axilla: Secondary | ICD-10-CM | POA: Insufficient documentation

## 2021-06-25 DIAGNOSIS — K047 Periapical abscess without sinus: Secondary | ICD-10-CM

## 2021-06-25 LAB — CBC WITH DIFFERENTIAL/PLATELET
Abs Immature Granulocytes: 0.05 10*3/uL (ref 0.00–0.07)
Basophils Absolute: 0 10*3/uL (ref 0.0–0.1)
Basophils Relative: 0 %
Eosinophils Absolute: 0.2 10*3/uL (ref 0.0–0.5)
Eosinophils Relative: 1 %
HCT: 30.9 % — ABNORMAL LOW (ref 36.0–46.0)
Hemoglobin: 9 g/dL — ABNORMAL LOW (ref 12.0–15.0)
Immature Granulocytes: 0 %
Lymphocytes Relative: 14 %
Lymphs Abs: 1.9 10*3/uL (ref 0.7–4.0)
MCH: 24.9 pg — ABNORMAL LOW (ref 26.0–34.0)
MCHC: 29.1 g/dL — ABNORMAL LOW (ref 30.0–36.0)
MCV: 85.6 fL (ref 80.0–100.0)
Monocytes Absolute: 0.8 10*3/uL (ref 0.1–1.0)
Monocytes Relative: 6 %
Neutro Abs: 11 10*3/uL — ABNORMAL HIGH (ref 1.7–7.7)
Neutrophils Relative %: 79 %
Platelets: 393 10*3/uL (ref 150–400)
RBC: 3.61 MIL/uL — ABNORMAL LOW (ref 3.87–5.11)
RDW: 16 % — ABNORMAL HIGH (ref 11.5–15.5)
WBC: 14 10*3/uL — ABNORMAL HIGH (ref 4.0–10.5)
nRBC: 0 % (ref 0.0–0.2)

## 2021-06-25 LAB — BASIC METABOLIC PANEL
Anion gap: 8 (ref 5–15)
BUN: 16 mg/dL (ref 6–20)
CO2: 19 mmol/L — ABNORMAL LOW (ref 22–32)
Calcium: 7.8 mg/dL — ABNORMAL LOW (ref 8.9–10.3)
Chloride: 108 mmol/L (ref 98–111)
Creatinine, Ser: 1.21 mg/dL — ABNORMAL HIGH (ref 0.44–1.00)
GFR, Estimated: 57 mL/min — ABNORMAL LOW (ref >60)
Glucose, Bld: 484 mg/dL — ABNORMAL HIGH (ref 70–99)
Potassium: 4.3 mmol/L (ref 3.5–5.1)
Sodium: 135 mmol/L (ref 135–145)

## 2021-06-25 LAB — CBG MONITORING, ED: Glucose-Capillary: 372 mg/dL — ABNORMAL HIGH (ref 70–99)

## 2021-06-25 MED ORDER — FLUCONAZOLE 150 MG PO TABS: 150.0000 mg | ORAL_TABLET | Freq: Once | ORAL | 0 refills | Status: AC

## 2021-06-25 MED ORDER — ONDANSETRON HCL 4 MG/2ML IJ SOLN
4.0000 mg | Freq: Once | INTRAMUSCULAR | Status: AC
  Administered 2021-06-25: 4 mg via INTRAVENOUS
  Filled 2021-06-25: qty 2

## 2021-06-25 MED ORDER — METFORMIN HCL 1000 MG PO TABS: 500.0000 mg | ORAL_TABLET | Freq: Two times a day (BID) | ORAL | 0 refills | Status: DC

## 2021-06-25 MED ORDER — INSULIN ASPART 100 UNIT/ML IJ SOLN
8.0000 [IU] | Freq: Once | INTRAMUSCULAR | Status: AC
  Administered 2021-06-25: 8 [IU] via INTRAVENOUS

## 2021-06-25 MED ORDER — SODIUM CHLORIDE 0.9 % IV BOLUS
1000.0000 mL | Freq: Once | INTRAVENOUS | Status: AC
  Administered 2021-06-25: 1000 mL via INTRAVENOUS

## 2021-06-25 MED ORDER — CLINDAMYCIN PHOSPHATE 600 MG/50ML IV SOLN
600.0000 mg | Freq: Once | INTRAVENOUS | Status: AC
  Administered 2021-06-25: 600 mg via INTRAVENOUS
  Filled 2021-06-25: qty 50

## 2021-06-25 MED ORDER — LIDOCAINE-EPINEPHRINE (PF) 2 %-1:200000 IJ SOLN
10.0000 mL | Freq: Once | INTRAMUSCULAR | Status: AC
  Administered 2021-06-25: 10 mL
  Filled 2021-06-25: qty 20

## 2021-06-25 MED ORDER — CLINDAMYCIN HCL 150 MG PO CAPS: 300.0000 mg | ORAL_CAPSULE | Freq: Three times a day (TID) | ORAL | 0 refills | Status: AC

## 2021-06-25 MED ORDER — MORPHINE SULFATE (PF) 4 MG/ML IV SOLN
4.0000 mg | Freq: Once | INTRAVENOUS | Status: AC
  Administered 2021-06-25: 4 mg via INTRAVENOUS
  Filled 2021-06-25: qty 1

## 2021-06-25 NOTE — ED Provider Notes (Signed)
[facility] EMERGENCY DEPARTMENT Provider Note   CSN: [id number] Arrival date & time: 06/25/21  0443     History Chief Complaint  Patient presents with   Abscess    Virginia [name] is a 45 y.o. female presenting for evaluation of right arm abscess, dental pain, and fevers.  Patient states of the past 1 to 2 weeks, she has had intermittent fevers.  She also reports dental pain at this area, took leftover nitrofurantoin with improvement of facial swelling but she still has dental pain.  In the past 2 days she developed a lesion under her right axilla which has grown in size and tenderness.  She has not taken anything for it.  She reports a history of diabetes, usually well controlled blood sugars less than 150 (is not on any medication for DM since gastric bypass surgery).  She reports BGLs have been up and down since having fevers.  She reports no other medical problems.  No confusion, weakness, nausea, vomiting, abdominal pain.  She reports a history of high blood pressure, but does not take medicine for this.  Additional history attained from chart review.  History of anxiety, arthritis, bipolar, depression, diabetes, hypertension.  HPI     Past Medical History:  Diagnosis Date   Anxiety    Arthritis    Bipolar affective (HCC)    Depression    Diabetes mellitus    Hypertension     Patient Active Problem List   Diagnosis Date Noted   Urinary tract infection without hematuria    Dehydration    Gastroesophageal reflux disease    Class 2 obesity due to excess calories with body mass index (BMI) of 39.0 to 39.9 in adult    Hypokalemia    Hypomagnesemia    Anxiety 02/20/2017   Diabetes mellitus without complication (HCC) 02/20/2017   Sepsis (HCC) 02/20/2017   Abdominal pain 02/20/2017   Nausea & vomiting 02/20/2017   Tobacco abuse 02/20/2017   Flank pain 02/20/2017   Hypertension    Lactic acidosis     Past Surgical History:  Procedure  Laterality Date   CESAREAN SECTION     CHOLECYSTECTOMY     GASTRIC BYPASS     KNEE SURGERY       OB History   No obstetric history on file.     Family History  Problem Relation Age of Onset   Asthma Mother    Hypertension Mother    Hypertension Father    Diabetes Mellitus II Father     Social History   Tobacco Use   Smoking status: Former    Packs/day: 0.50    Types: Cigarettes    Quit date: 02/20/2013    Years since quitting: 8.3   Smokeless tobacco: Never  Substance Use Topics   Alcohol use: Yes    Comment: occasional   Drug use: No    Home Medications Prior to Admission medications   Medication Sig Start Date End Date Taking? Authorizing Provider  clindamycin (CLEOCIN) 150 MG capsule Take 2 capsules (300 mg total) by mouth 3 (three) times daily for 7 days. 06/25/21 07/02/21 Yes Latrisha Coiro, PA-C  fluconazole (DIFLUCAN) 150 MG tablet Take 1 tablet (150 mg total) by mouth once for 1 dose. Take as needed if having symptoms of a yeast infection after finishing the antibiotics 06/25/21 06/25/21 Yes Alisea Matte, PA-C  metFORMIN (GLUCOPHAGE) 1000 MG tablet Take 0.5 tablets (500 mg total) by mouth 2 (two) times daily with a meal  for 20 days. 06/25/21 07/15/21 Yes Whitaker Holderman, PA-C  albuterol (PROAIR HFA) 108 (90 Base) MCG/ACT inhaler Inhale 2 puffs every 4 (four) hours as needed into the lungs for wheezing or shortness of breath.    [provider]  ALPRAZolam Prudy Feeler) 1 MG tablet Take 1 mg by mouth 4 (four) times daily as needed for anxiety or sleep.  02/26/20   [provider]  amphetamine-dextroamphetamine (ADDERALL) 20 MG tablet Take 20 mg by mouth 2 (two) times daily. 02/26/20   [provider]  calcium carbonate (TUMS - DOSED IN MG ELEMENTAL CALCIUM) 500 MG chewable tablet Chew 3,000 mg daily as needed by mouth for indigestion or heartburn.    [provider]  clonazePAM (KLONOPIN) 0.5 MG tablet Take 0.5 mg 2 (two) times daily  by mouth. 09/23/17   [provider]  cyclobenzaprine (FLEXERIL) 10 MG tablet Take 10 mg at bedtime by mouth. 09/23/17   [provider]  dimenhyDRINATE (DRAMAMINE) 50 MG tablet Take 50 mg every 6 (six) hours as needed by mouth for nausea.    [provider]  doxycycline (VIBRAMYCIN) 100 MG capsule Take 1 capsule (100 mg total) by mouth 2 (two) times daily. 07/13/19   Ward, Chase Picket, PA-C  FLUoxetine (PROZAC) 20 MG capsule Take 20 mg daily by mouth. 09/10/17   [provider]  FLUoxetine (PROZAC) 40 MG capsule Take 80 mg by mouth daily.  12/21/19   [provider]  HYDROcodone-acetaminophen (NORCO/VICODIN) 5-325 MG tablet Take 1 tablet by mouth every 6 (six) hours as needed for severe pain. 07/13/19   Ward, Chase Picket, PA-C  insulin glargine (LANTUS) 100 UNIT/ML injection Inject 18 Units into the skin at bedtime.     [provider]  losartan (COZAAR) 50 MG tablet Take 50 mg daily by mouth. 08/09/17   [provider]  metoprolol tartrate (LOPRESSOR) 50 MG tablet Take 50 mg 2 (two) times daily by mouth.    [provider]  Multiple Vitamin (MULTIVITAMIN WITH MINERALS) TABS tablet Take 1 tablet daily by mouth.    [provider]  multivitamin-iron-minerals-folic acid (CENTRUM) chewable tablet Chew 1 tablet by mouth daily. Patient not taking: Reported on 10/19/2017 02/22/17   Vassie Loll, MD  omeprazole (PRILOSEC) 40 MG capsule Take 40 mg daily by mouth. 07/12/17   [provider]  ondansetron (ZOFRAN ODT) 4 MG disintegrating tablet Take 1 tablet (4 mg total) by mouth every 8 (eight) hours as needed for nausea or vomiting. 07/13/19   Ward, Chase Picket, PA-C  Probiotic Product (PROBIOTIC PO) Take 1 capsule daily by mouth.    [provider]  promethazine (PHENERGAN) 25 MG suppository Place 25 mg every 6 (six) hours as needed rectally for nausea or vomiting.    [provider]  promethazine  (PHENERGAN) 25 MG tablet Take 1 tablet (25 mg total) by mouth every 6 (six) hours as needed for nausea. Patient not taking: Reported on 10/19/2017 05/16/17   Trixie Dredge, PA-C  ranitidine (ZANTAC) 150 MG tablet Take 150 mg daily as needed by mouth for heartburn (nausea).    [provider]  vitamin B-12 (CYANOCOBALAMIN) 1000 MCG tablet Take 1 tablet (1,000 mcg total) by mouth daily. Patient not taking: Reported on 10/19/2017 02/22/17   Vassie Loll, MD    Allergies    Ace inhibitors, Ace inhibitors, Lamictal [lamotrigine], and Nsaids  Review of Systems   Review of Systems  Constitutional:  Positive for fever.  HENT:  Positive for dental  problem.   Skin:        R axillary abscess  All other systems reviewed and are negative.  Physical Exam Updated Vital Signs BP (!) 161/127   Pulse 95   Temp 98.1 F (36.7 C) (Oral)   Resp (!) 22   Ht 5\' 1"  (1.549 m)   Wt 90.7 kg   SpO2 100%   BMI 37.79 kg/m   Physical Exam Vitals and nursing note reviewed.  Constitutional:      General: She is not in acute distress.    Appearance: Normal appearance.     Comments: nontoxic  HENT:     Head: Normocephalic and atraumatic.     Mouth/Throat:     Dentition: Abnormal dentition. Dental tenderness and dental caries present.      Comments: Overall poor dentition.  Mostly edentulous.  Tenderness to palpation of right upper and lower tooth.  No pain under the tongue.  No trismus.  Handling secretions easily.  No significant facial swelling. Eyes:     Conjunctiva/sclera: Conjunctivae normal.     Pupils: Pupils are equal, round, and reactive to light.  Cardiovascular:     Rate and Rhythm: Normal rate and regular rhythm.     Pulses: Normal pulses.  Pulmonary:     Effort: Pulmonary effort is normal. No respiratory distress.     Breath sounds: Normal breath sounds. No wheezing.     Comments: Speaking in full sentences.  Clear lung sounds in all fields. Abdominal:     General: There is no  distension.     Palpations: Abdomen is soft.     Tenderness: There is no abdominal tenderness.  Musculoskeletal:        General: Normal range of motion.     Cervical back: Normal range of motion and neck supple.  Skin:    General: Skin is warm and dry.     Capillary Refill: Capillary refill takes less than 2 seconds.     Comments: Right axillary abscess without surrounding skin induration or erythema.  Surrounding lymphadenopathy.  No active drainage  Neurological:     Mental Status: She is alert and oriented to person, place, and time.  Psychiatric:        Mood and Affect: Mood and affect normal.        Speech: Speech normal.        Behavior: Behavior normal.    ED Results / Procedures / Treatments   Labs (all labs ordered are listed, but only abnormal results are displayed) Labs Reviewed  CBC WITH DIFFERENTIAL/PLATELET - Abnormal; Notable for the following components:      Result Value   WBC 14.0 (*)    RBC 3.61 (*)    Hemoglobin 9.0 (*)    HCT 30.9 (*)    MCH 24.9 (*)    MCHC 29.1 (*)    RDW 16.0 (*)    Neutro Abs 11.0 (*)    All other components within normal limits  BASIC METABOLIC PANEL - Abnormal; Notable for the following components:   CO2 19 (*)    Glucose, Bld 484 (*)    Creatinine, Ser 1.21 (*)    Calcium 7.8 (*)    GFR, Estimated 57 (*)    All other components within normal limits  CBG MONITORING, ED - Abnormal; Notable for the following components:   Glucose-Capillary 372 (*)    All other components within normal limits    EKG None  Radiology No results found.  Procedures . Incision  and Drainage  Date/Time: 06/25/2021 9:20 AM Performed by: Alveria Apleyaccavale, Karliah Kowalchuk, PA-C Authorized by: Alveria Apleyaccavale, Delanda Bulluck, PA-C   Consent:    Consent obtained:  Verbal   Consent given by:  Patient   Risks, benefits, and alternatives were discussed: yes     Risks discussed:  Bleeding, incomplete drainage, pain, infection and damage to other organs Location:    Type:   Abscess   Location:  Upper extremity   Upper extremity location: R axilla. Pre-procedure details:    Skin preparation:  Chlorhexidine with alcohol Sedation:    Sedation type:  None Anesthesia:    Anesthesia method:  Local infiltration   Local anesthetic:  Lidocaine 2% WITH epi Procedure type:    Complexity:  Simple Procedure details:    Incision types:  Single straight   Wound management:  Probed and deloculated and irrigated with saline   Drainage:  Purulent and bloody   Drainage amount:  Moderate   Wound treatment:  Wound left open   Packing materials:  1/4 in iodoform gauze Post-procedure details:    Procedure completion:  Tolerated well, no immediate complications   Medications Ordered in ED Medications  ondansetron (ZOFRAN) injection 4 mg (has no administration in time range)  sodium chloride 0.9 % bolus 1,000 mL (1,000 mLs Intravenous New Bag/Given 06/25/21 0734)  insulin aspart (novoLOG) injection 8 Units (8 Units Intravenous Given 06/25/21 0737)  clindamycin (CLEOCIN) IVPB 600 mg (0 mg Intravenous Stopped 06/25/21 0818)  morphine 4 MG/ML injection 4 mg (4 mg Intravenous Given 06/25/21 0735)  lidocaine-EPINEPHrine (XYLOCAINE W/EPI) 2 %-1:200000 (PF) injection 10 mL (10 mLs Infiltration Given 06/25/21 [id number])    ED Course  I have reviewed the triage vital signs and the nursing notes.  Pertinent labs & imaging results that were available during my care of the patient were reviewed by me and considered in my medical decision making (see chart for details).    MDM Rules/Calculators/A&P                          Patient presenting for evaluation of fever and axillary abscess.  On exam, patient appears nontoxic.  Blood work obtained from triage shows hyperglycemia.  CO2 is slightly low at 19, however no anion gap.  This is likely due to dehydration over DKA.  Patient clinically without signs of DKA.  Will give fluids, insulin, and treat infections.  I&D performed as described  above.  Patient's blood sugar improving with fluids and insulin.  She is not currently on any medication for her diabetes, as such, we will start her on metformin due to her elevated blood sugars today.  Additionally, discussed importance of following up with primary care for further evaluation of blood pressure.  While this has improved in the ER with control of pain, it is still elevated at time of discharge.  Will give clindamycin to treat both abscess and dental infection.  Discussed aftercare instruction of the abscess.  Encourage close follow-up with PCP.  Patient will need to return in 2 to 3 days for packing removal and recheck of abscess.  At this time, patient appears safe for discharge.  Return precautions given.  Patient states she understands and agrees to plan.   Final Clinical Impression(s) / ED Diagnoses Final diagnoses:  Abscess of axilla, right  Dental infection  Hyperglycemia  Hypertension, unspecified type    Rx / DC Orders ED Discharge Orders          Ordered  metFORMIN (GLUCOPHAGE) 1000 MG tablet  2 times daily with meals        06/25/21 0840    clindamycin (CLEOCIN) 150 MG capsule  3 times daily        06/25/21 0840    fluconazole (DIFLUCAN) 150 MG tablet   Once        06/25/21 0840             Jontay Maston, PA-C 06/25/21 4709    Jacalyn Lefevre, MD 06/25/21 [phone]

## 2021-06-25 NOTE — ED Triage Notes (Signed)
C/o fever off and on for 2 1/2 weeks c/o abscess under right arm.

## 2021-06-25 NOTE — Discharge Instructions (Addendum)
Take antibiotics as prescribed.  Take the entire course, even if symptoms improve. Take metformin twice a day with meals to help manage her blood sugars.  Keep a close eye on blood sugar and it is important to follow-up with your primary care doctor regarding this. If you have symptoms of yeast infection after finishing the antibiotics, take Diflucan. Use Tylenol ibuprofen as needed for pain. Use warm compresses to help with pain and swelling. Follow-up either at your primary care doctor, urgent care, or in the ER in 2 to 3 days for packing removal and recheck of your abscess. Return to the emergency room with any new commotion, concerning symptoms.

## 2021-06-25 NOTE — ED Provider Notes (Signed)
Emergency Medicine Provider Triage Evaluation Note  Virginia [name] , a 45 y.o. female  was evaluated in triage.  Pt complains of fever.  Has been ongoing for about 2 weeks now.  Though initially due to infected tooth or sinus infection and taking someone else's abx which helped some.  States now she has numerous "bumps" beneath right axilla that are painful.  She is diabetic.  Review of Systems  Positive: Fever, bumps right axilla Negative: Cough, vomiting  Physical Exam  BP (!) 206/103 (BP Location: Left Arm)   Pulse (!) 101   Temp 98.1 F (36.7 C) (Oral)   Resp (!) 22   SpO2 100%  Gen:   Awake, no distress   Resp:  Normal effort  MSK:   Moves extremities without difficulty  Other:  Few pustules noted right axilla  Medical Decision Making  Medically screening exam initiated at 5:14 AM.  Appropriate orders placed.  Virginia [name] was informed that the remainder of the evaluation will be completed by another provider, this initial triage assessment does not replace that evaluation, and the importance of remaining in the ED until their evaluation is complete.  Afebrile here.  Reports she is diabetic with concern for abscesses right axilla.  Will obtain labs, CBG.   [name], PA-C 06/25/21 8638    Benjiman Core, MD 06/25/21 [phone]

## 2021-06-25 NOTE — ED Notes (Signed)
Patient discharge instructions and prescriptions reviewed with the patient. The patient verbalized understanding of instructions. Patient discharged. ?

## 2021-06-28 ENCOUNTER — Ambulatory Visit (HOSPITAL_COMMUNITY)
Admission: EM | Admit: 2021-06-28 | Discharge: 2021-06-28 | Disposition: A | Discharge status: Home / Self Care | Payer: PRIVATE HEALTH INSURANCE | Attending: Family Medicine | Admitting: Family Medicine

## 2021-06-28 ENCOUNTER — Telehealth (HOSPITAL_COMMUNITY): Payer: Self-pay | Admitting: Emergency Medicine

## 2021-06-28 ENCOUNTER — Encounter (HOSPITAL_COMMUNITY): Payer: Self-pay | Admitting: *Deleted

## 2021-06-28 ENCOUNTER — Other Ambulatory Visit: Payer: Self-pay

## 2021-06-28 DIAGNOSIS — L02411 Cutaneous abscess of right axilla: Secondary | ICD-10-CM

## 2021-06-28 MED ORDER — HIBICLENS 4 % EX LIQD: Freq: Every day | CUTANEOUS | 0 refills | Status: DC | PRN

## 2021-06-28 NOTE — ED Triage Notes (Signed)
Pt presents today for recheck of Wound on packing on RT breast.

## 2021-06-28 NOTE — ED Provider Notes (Signed)
[facility]    CSN: [id number] Arrival date & time: 06/28/21  1716      History   Chief Complaint Chief Complaint  Patient presents with   Follow-up    HPI Virginia [name] is a 45 y.o. female.   Patient presenting today following up on a right axillary abscess that she was seen for in the emergency department about 3 days ago.  The area was I&D'd and packed, she was given antibiotics which she has been taking compliantly and she is here for a packing follow-up.  She states she is still having a significant amount of pain in the area, but it is much improved from previous and she has had no fevers since starting the antibiotics.   Past Medical History:  Diagnosis Date   Anxiety    Arthritis    Bipolar affective (HCC)    Depression    Diabetes mellitus    Hypertension     Patient Active Problem List   Diagnosis Date Noted   Urinary tract infection without hematuria    Dehydration    Gastroesophageal reflux disease    Class 2 obesity due to excess calories with body mass index (BMI) of 39.0 to 39.9 in adult    Hypokalemia    Hypomagnesemia    Anxiety 02/20/2017   Diabetes mellitus without complication (HCC) 02/20/2017   Sepsis (HCC) 02/20/2017   Abdominal pain 02/20/2017   Nausea & vomiting 02/20/2017   Tobacco abuse 02/20/2017   Flank pain 02/20/2017   Hypertension    Lactic acidosis     Past Surgical History:  Procedure Laterality Date   CESAREAN SECTION     CHOLECYSTECTOMY     GASTRIC BYPASS     KNEE SURGERY      OB History   No obstetric history on file.      Home Medications    Prior to Admission medications   Medication Sig Start Date End Date Taking? Authorizing Provider  albuterol (PROAIR HFA) 108 (90 Base) MCG/ACT inhaler Inhale 2 puffs every 4 (four) hours as needed into the lungs for wheezing or shortness of breath.    [provider]  ALPRAZolam Prudy Feeler) 1 MG tablet Take 1 mg by mouth 4 (four) times daily as  needed for anxiety or sleep.  02/26/20   [provider]  amphetamine-dextroamphetamine (ADDERALL) 20 MG tablet Take 20 mg by mouth 2 (two) times daily. 02/26/20   [provider]  calcium carbonate (TUMS - DOSED IN MG ELEMENTAL CALCIUM) 500 MG chewable tablet Chew 3,000 mg daily as needed by mouth for indigestion or heartburn.    [provider]  chlorhexidine (HIBICLENS) 4 % external liquid Apply topically daily as needed. 06/28/21   Particia Nearing, PA-C  clindamycin (CLEOCIN) 150 MG capsule Take 2 capsules (300 mg total) by mouth 3 (three) times daily for 7 days. 06/25/21 07/02/21  Caccavale, Sophia, PA-C  clonazePAM (KLONOPIN) 0.5 MG tablet Take 0.5 mg 2 (two) times daily by mouth. 09/23/17   [provider]  cyclobenzaprine (FLEXERIL) 10 MG tablet Take 10 mg at bedtime by mouth. 09/23/17   [provider]  dimenhyDRINATE (DRAMAMINE) 50 MG tablet Take 50 mg every 6 (six) hours as needed by mouth for nausea.    [provider]  doxycycline (VIBRAMYCIN) 100 MG capsule Take 1 capsule (100 mg total) by mouth 2 (two) times daily. 07/13/19   Ward, Chase Picket, PA-C  FLUoxetine (PROZAC) 20 MG capsule Take 20 mg daily  by mouth. 09/10/17   [provider]  FLUoxetine (PROZAC) 40 MG capsule Take 80 mg by mouth daily.  12/21/19   [provider]  HYDROcodone-acetaminophen (NORCO/VICODIN) 5-325 MG tablet Take 1 tablet by mouth every 6 (six) hours as needed for severe pain. 07/13/19   Ward, Chase Picket, PA-C  insulin glargine (LANTUS) 100 UNIT/ML injection Inject 18 Units into the skin at bedtime.     [provider]  losartan (COZAAR) 50 MG tablet Take 50 mg daily by mouth. 08/09/17   [provider]  metFORMIN (GLUCOPHAGE) 1000 MG tablet Take 0.5 tablets (500 mg total) by mouth 2 (two) times daily with a meal for 20 days. 06/25/21 07/15/21  Caccavale, Sophia, PA-C  metoprolol tartrate (LOPRESSOR) 50 MG tablet Take 50 mg 2  (two) times daily by mouth.    [provider]  Multiple Vitamin (MULTIVITAMIN WITH MINERALS) TABS tablet Take 1 tablet daily by mouth.    [provider]  multivitamin-iron-minerals-folic acid (CENTRUM) chewable tablet Chew 1 tablet by mouth daily. Patient not taking: Reported on 10/19/2017 02/22/17   Vassie Loll, MD  omeprazole (PRILOSEC) 40 MG capsule Take 40 mg daily by mouth. 07/12/17   [provider]  ondansetron (ZOFRAN ODT) 4 MG disintegrating tablet Take 1 tablet (4 mg total) by mouth every 8 (eight) hours as needed for nausea or vomiting. 07/13/19   Ward, Chase Picket, PA-C  Probiotic Product (PROBIOTIC PO) Take 1 capsule daily by mouth.    [provider]  promethazine (PHENERGAN) 25 MG suppository Place 25 mg every 6 (six) hours as needed rectally for nausea or vomiting.    [provider]  promethazine (PHENERGAN) 25 MG tablet Take 1 tablet (25 mg total) by mouth every 6 (six) hours as needed for nausea. Patient not taking: Reported on 10/19/2017 05/16/17   Trixie Dredge, PA-C  ranitidine (ZANTAC) 150 MG tablet Take 150 mg daily as needed by mouth for heartburn (nausea).    [provider]  vitamin B-12 (CYANOCOBALAMIN) 1000 MCG tablet Take 1 tablet (1,000 mcg total) by mouth daily. Patient not taking: Reported on 10/19/2017 02/22/17   Vassie Loll, MD    Family History Family History  Problem Relation Age of Onset   Asthma Mother    Hypertension Mother    Hypertension Father    Diabetes Mellitus II Father     Social History Social History   Tobacco Use   Smoking status: Former    Packs/day: 0.50    Types: Cigarettes    Quit date: 02/20/2013    Years since quitting: 8.3   Smokeless tobacco: Never  Substance Use Topics   Alcohol use: Yes    Comment: occasional   Drug use: No     Allergies   Ace inhibitors, Ace inhibitors, Lamictal [lamotrigine], and Nsaids   Review of Systems Review of Systems Per  HPI  Physical Exam Triage Vital Signs ED Triage Vitals  Enc Vitals Group     BP 06/28/21 1750 (!) 163/104     Pulse Rate 06/28/21 1750 (!) 1     Resp 06/28/21 1750 18     Temp 06/28/21 1750 98.4 F (36.9 C)     Temp Source 06/28/21 1750 Oral     SpO2 06/28/21 1750 100 %     Weight --      Height --      Head Circumference --      Peak Flow --      Pain Score 06/28/21  1752 7     Pain Loc --      Pain Edu? --      Excl. in GC? --    No data found.  Updated Vital Signs BP (!) 163/104   Pulse 95   Temp 98.4 F (36.9 C) (Oral)   Resp 18   LMP 06/26/2021   SpO2 98%   Visual Acuity Right Eye Distance:   Left Eye Distance:   Bilateral Distance:    Right Eye Near:   Left Eye Near:    Bilateral Near:     Physical Exam Vitals and nursing note reviewed.  Constitutional:      Appearance: Normal appearance. She is not ill-appearing.  HENT:     Head: Atraumatic.  Eyes:     Extraocular Movements: Extraocular movements intact.     Conjunctiva/sclera: Conjunctivae normal.  Cardiovascular:     Rate and Rhythm: Normal rate and regular rhythm.     Heart sounds: Normal heart sounds.  Pulmonary:     Effort: Pulmonary effort is normal.     Breath sounds: Normal breath sounds.  Musculoskeletal:        General: Normal range of motion.     Cervical back: Normal range of motion and neck supple.  Skin:    General: Skin is warm.     Comments: Right axillary region with about 4 cm of packing material in place, this was removed with very minimal drainage upon applying pressure to the area.  Still significant tenderness and some firm portions of the area but appears to be improving from previous account.  Neurological:     Mental Status: She is alert and oriented to person, place, and time.  Psychiatric:        Mood and Affect: Mood normal.        Thought Content: Thought content normal.        Judgment: Judgment normal.     UC Treatments / Results  Labs (all labs ordered are  listed, but only abnormal results are displayed) Labs Reviewed - No data to display  EKG   Radiology No results found.  Procedures Procedures (including critical care time)  Medications Ordered in UC Medications - No data to display  Initial Impression / Assessment and Plan / UC Course  I have reviewed the triage vital signs and the nursing notes.  Pertinent labs & imaging results that were available during my care of the patient were reviewed by me and considered in my medical decision making (see chart for details).     Packing material removed without complication, area cleaned and dressing placed.  Discussed home wound care with Hibiclens, ointment and keeping covered.  Continue remainder of antibiotics and follow-up at that time if not fully resolved.  Return precautions given for acutely worsening symptoms at any time.  Final Clinical Impressions(s) / UC Diagnoses   Final diagnoses:  Abscess of right axilla   Discharge Instructions   None    ED Prescriptions     Medication Sig Dispense Auth. Provider   chlorhexidine (HIBICLENS) 4 % external liquid Apply topically daily as needed. 120 mL Particia Nearing, [place]      PDMP not reviewed this encounter.   Particia Nearing, [place] 06/28/21 1842

## 2021-11-22 ENCOUNTER — Encounter (HOSPITAL_COMMUNITY): Payer: Self-pay | Admitting: Emergency Medicine

## 2021-11-22 ENCOUNTER — Emergency Department (HOSPITAL_COMMUNITY)
Admission: EM | Admit: 2021-11-22 | Discharge: 2021-11-22 | Disposition: A | Discharge status: Home / Self Care | Payer: 59 | Attending: Emergency Medicine | Admitting: Emergency Medicine

## 2021-11-22 ENCOUNTER — Other Ambulatory Visit: Payer: Self-pay

## 2021-11-22 ENCOUNTER — Emergency Department (HOSPITAL_COMMUNITY): Payer: 59

## 2021-11-22 DIAGNOSIS — Z20822 Contact with and (suspected) exposure to covid-19: Secondary | ICD-10-CM | POA: Insufficient documentation

## 2021-11-22 DIAGNOSIS — J029 Acute pharyngitis, unspecified: Secondary | ICD-10-CM | POA: Insufficient documentation

## 2021-11-22 DIAGNOSIS — Z87891 Personal history of nicotine dependence: Secondary | ICD-10-CM | POA: Insufficient documentation

## 2021-11-22 DIAGNOSIS — E119 Type 2 diabetes mellitus without complications: Secondary | ICD-10-CM | POA: Insufficient documentation

## 2021-11-22 DIAGNOSIS — Z7984 Long term (current) use of oral hypoglycemic drugs: Secondary | ICD-10-CM | POA: Insufficient documentation

## 2021-11-22 DIAGNOSIS — I10 Essential (primary) hypertension: Secondary | ICD-10-CM | POA: Diagnosis not present

## 2021-11-22 DIAGNOSIS — Z79899 Other long term (current) drug therapy: Secondary | ICD-10-CM | POA: Diagnosis not present

## 2021-11-22 DIAGNOSIS — K047 Periapical abscess without sinus: Secondary | ICD-10-CM | POA: Diagnosis not present

## 2021-11-22 DIAGNOSIS — R220 Localized swelling, mass and lump, head: Secondary | ICD-10-CM | POA: Diagnosis present

## 2021-11-22 LAB — COMPREHENSIVE METABOLIC PANEL
ALT: 15 U/L (ref 0–44)
AST: 21 U/L (ref 15–41)
Albumin: 2.8 g/dL — ABNORMAL LOW (ref 3.5–5.0)
Alkaline Phosphatase: 140 U/L — ABNORMAL HIGH (ref 38–126)
Anion gap: 7 (ref 5–15)
BUN: 15 mg/dL (ref 6–20)
CO2: 21 mmol/L — ABNORMAL LOW (ref 22–32)
Calcium: 8 mg/dL — ABNORMAL LOW (ref 8.9–10.3)
Chloride: 109 mmol/L (ref 98–111)
Creatinine, Ser: 1.04 mg/dL — ABNORMAL HIGH (ref 0.44–1.00)
GFR, Estimated: >60 mL/min (ref >60)
Glucose, Bld: 253 mg/dL — ABNORMAL HIGH (ref 70–99)
Potassium: 4.2 mmol/L (ref 3.5–5.1)
Sodium: 137 mmol/L (ref 135–145)
Total Bilirubin: 0.4 mg/dL (ref 0.3–1.2)
Total Protein: 7.3 g/dL (ref 6.5–8.1)

## 2021-11-22 LAB — CBC WITH DIFFERENTIAL/PLATELET
Abs Immature Granulocytes: 0.03 10*3/uL (ref 0.00–0.07)
Basophils Absolute: 0 10*3/uL (ref 0.0–0.1)
Basophils Relative: 0 %
Eosinophils Absolute: 0.2 10*3/uL (ref 0.0–0.5)
Eosinophils Relative: 2 %
HCT: 32.2 % — ABNORMAL LOW (ref 36.0–46.0)
Hemoglobin: 9.6 g/dL — ABNORMAL LOW (ref 12.0–15.0)
Immature Granulocytes: 0 %
Lymphocytes Relative: 26 %
Lymphs Abs: 2.7 10*3/uL (ref 0.7–4.0)
MCH: 25 pg — ABNORMAL LOW (ref 26.0–34.0)
MCHC: 29.8 g/dL — ABNORMAL LOW (ref 30.0–36.0)
MCV: 83.9 fL (ref 80.0–100.0)
Monocytes Absolute: 0.6 10*3/uL (ref 0.1–1.0)
Monocytes Relative: 6 %
Neutro Abs: 6.7 10*3/uL (ref 1.7–7.7)
Neutrophils Relative %: 66 %
Platelets: 268 10*3/uL (ref 150–400)
RBC: 3.84 MIL/uL — ABNORMAL LOW (ref 3.87–5.11)
RDW: 15.7 % — ABNORMAL HIGH (ref 11.5–15.5)
WBC: 10.3 10*3/uL (ref 4.0–10.5)
nRBC: 0 % (ref 0.0–0.2)

## 2021-11-22 LAB — I-STAT CHEM 8, ED
BUN: 19 mg/dL (ref 6–20)
Calcium, Ion: 1.07 mmol/L — ABNORMAL LOW (ref 1.15–1.40)
Chloride: 109 mmol/L (ref 98–111)
Creatinine, Ser: 1 mg/dL (ref 0.44–1.00)
Glucose, Bld: 252 mg/dL — ABNORMAL HIGH (ref 70–99)
HCT: 33 % — ABNORMAL LOW (ref 36.0–46.0)
Hemoglobin: 11.2 g/dL — ABNORMAL LOW (ref 12.0–15.0)
Potassium: 4.2 mmol/L (ref 3.5–5.1)
Sodium: 140 mmol/L (ref 135–145)
TCO2: 22 mmol/L (ref 22–32)

## 2021-11-22 LAB — URINALYSIS, ROUTINE W REFLEX MICROSCOPIC
Bilirubin Urine: NEGATIVE
Glucose, UA: >=500 mg/dL — AB
Ketones, ur: NEGATIVE mg/dL
Nitrite: NEGATIVE
Protein, ur: 100 mg/dL — AB
Specific Gravity, Urine: 1.016 (ref 1.005–1.030)
pH: 5 (ref 5.0–8.0)

## 2021-11-22 LAB — RESP PANEL BY RT-PCR (FLU A&B, COVID) ARPGX2
Influenza A by PCR: NEGATIVE
Influenza B by PCR: NEGATIVE
SARS Coronavirus 2 by RT PCR: NEGATIVE

## 2021-11-22 LAB — POC URINE PREG, ED: Preg Test, Ur: NEGATIVE

## 2021-11-22 LAB — PREGNANCY, URINE: Preg Test, Ur: NEGATIVE

## 2021-11-22 LAB — GROUP A STREP BY PCR: Group A Strep by PCR: NOT DETECTED

## 2021-11-22 MED ORDER — IOHEXOL 300 MG/ML  SOLN
75.0000 mL | Freq: Once | INTRAMUSCULAR | Status: AC | PRN
  Administered 2021-11-22: 75 mL via INTRAVENOUS

## 2021-11-22 MED ORDER — SODIUM CHLORIDE 0.9 % IV SOLN
3.0000 g | Freq: Once | INTRAVENOUS | Status: AC
  Administered 2021-11-22: 3 g via INTRAVENOUS
  Filled 2021-11-22: qty 8

## 2021-11-22 MED ORDER — OXYCODONE-ACETAMINOPHEN 5-325 MG PO TABS
1.0000 | ORAL_TABLET | Freq: Once | ORAL | Status: AC
  Administered 2021-11-22: 1 via ORAL
  Filled 2021-11-22: qty 1

## 2021-11-22 MED ORDER — FENTANYL CITRATE PF 50 MCG/ML IJ SOSY
50.0000 ug | PREFILLED_SYRINGE | Freq: Once | INTRAMUSCULAR | Status: AC
  Administered 2021-11-22: 50 ug via INTRAVENOUS
  Filled 2021-11-22: qty 1

## 2021-11-22 MED ORDER — AMOXICILLIN-POT CLAVULANATE 875-125 MG PO TABS: 1.0000 | ORAL_TABLET | Freq: Two times a day (BID) | ORAL | 0 refills | Status: DC

## 2021-11-22 MED ORDER — HYDROCODONE-ACETAMINOPHEN 5-325 MG PO TABS: 1.0000 | ORAL_TABLET | Freq: Four times a day (QID) | ORAL | 0 refills | Status: DC | PRN

## 2021-11-22 NOTE — ED Provider Notes (Signed)
[facility] EMERGENCY DEPARTMENT Provider Note   CSN: [id number] Arrival date & time: 11/22/21  1001     History Chief Complaint  Patient presents with   Facial Swelling    Virginia [name] is a 45 y.o. female.  HPI     1-2 weeks of sore throat, initially couldn't swallow and felt tight but now improved but still sore, now pain with swallowing but able to swallow, not drooling today, no shortness of breath, voice does sound rattly 3-4 days right facial swelling No falls or trauma, dental pain upper right Right side of cheek, worse with eating and drinking  Rhinorrhea/congestion for weeks   Past Medical History:  Diagnosis Date   Anxiety    Arthritis    Bipolar affective (HCC)    Depression    Diabetes mellitus    Hypertension     Patient Active Problem List   Diagnosis Date Noted   Urinary tract infection without hematuria    Dehydration    Gastroesophageal reflux disease    Class 2 obesity due to excess calories with body mass index (BMI) of 39.0 to 39.9 in adult    Hypokalemia    Hypomagnesemia    Anxiety 02/20/2017   Diabetes mellitus without complication (HCC) 02/20/2017   Sepsis (HCC) 02/20/2017   Abdominal pain 02/20/2017   Nausea & vomiting 02/20/2017   Tobacco abuse 02/20/2017   Flank pain 02/20/2017   Hypertension    Lactic acidosis     Past Surgical History:  Procedure Laterality Date   CESAREAN SECTION     CHOLECYSTECTOMY     GASTRIC BYPASS     KNEE SURGERY       OB History   No obstetric history on file.     Family History  Problem Relation Age of Onset   Asthma Mother    Hypertension Mother    Hypertension Father    Diabetes Mellitus II Father     Social History   Tobacco Use   Smoking status: Former    Packs/day: 0.50    Types: Cigarettes    Quit date: 02/20/2013    Years since quitting: 8.7   Smokeless tobacco: Never  Substance Use Topics   Alcohol use: Yes    Comment: occasional   Drug use:  No    Home Medications Prior to Admission medications   Medication Sig Start Date End Date Taking? Authorizing Provider  albuterol (VENTOLIN HFA) 108 (90 Base) MCG/ACT inhaler Inhale 2 puffs every 4 (four) hours as needed into the lungs for wheezing or shortness of breath.   Yes [provider]  ALPRAZolam Prudy Feeler) 1 MG tablet Take 1 mg by mouth 4 (four) times daily as needed for anxiety or sleep.  02/26/20  Yes [provider]  amoxicillin-clavulanate (AUGMENTIN) 875-125 MG tablet Take 1 tablet by mouth every 12 (twelve) hours. 11/22/21  Yes Benjiman Core, MD  amphetamine-dextroamphetamine (ADDERALL) 20 MG tablet Take 20 mg by mouth 2 (two) times daily. 02/26/20  Yes [provider]  dimenhyDRINATE (DRAMAMINE) 50 MG tablet Take 50 mg every 6 (six) hours as needed by mouth for nausea.   Yes [provider]  FLUoxetine (PROZAC) 40 MG capsule Take 40 mg by mouth daily. 12/21/19  Yes [provider]  HYDROcodone-acetaminophen (NORCO/VICODIN) 5-325 MG tablet Take 1 tablet by mouth every 6 (six) hours as needed. 11/22/21  Yes Benjiman Core, MD  loratadine (CLARITIN) 10 MG tablet Take 10 mg by mouth daily.   Yes  [provider]  metFORMIN (GLUCOPHAGE) 500 MG tablet Take 500 mg by mouth daily with breakfast.   Yes [provider]  Multiple Vitamin (MULTIVITAMIN WITH MINERALS) TABS tablet Take 1 tablet daily by mouth.   Yes [provider]  omeprazole (PRILOSEC) 40 MG capsule Take 40 mg by mouth in the morning and at bedtime. 07/12/17  Yes [provider]  chlorhexidine (HIBICLENS) 4 % external liquid Apply topically daily as needed. Patient not taking: Reported on 11/22/2021 06/28/21   Volney American, PA-C  metFORMIN (GLUCOPHAGE) 1000 MG tablet Take 0.5 tablets (500 mg total) by mouth 2 (two) times daily with a meal for 20 days. 06/25/21 07/15/21  Caccavale, Sophia, PA-C  multivitamin-iron-minerals-folic acid  (CENTRUM) chewable tablet Chew 1 tablet by mouth daily. Patient not taking: Reported on 10/19/2017 02/22/17   Barton Dubois, MD  ondansetron (ZOFRAN ODT) 4 MG disintegrating tablet Take 1 tablet (4 mg total) by mouth every 8 (eight) hours as needed for nausea or vomiting. Patient not taking: Reported on 11/22/2021 07/13/19   Ward, Ozella Almond, PA-C  promethazine (PHENERGAN) 25 MG tablet Take 1 tablet (25 mg total) by mouth every 6 (six) hours as needed for nausea. Patient not taking: Reported on 10/19/2017 05/16/17   Clayton Bibles, PA-C  vitamin B-12 (CYANOCOBALAMIN) 1000 MCG tablet Take 1 tablet (1,000 mcg total) by mouth daily. Patient not taking: Reported on 10/19/2017 02/22/17   Barton Dubois, MD    Allergies    Ace inhibitors, Ace inhibitors, Lamictal [lamotrigine], Nsaids, and Topiramate  Review of Systems   Review of Systems  Constitutional:  Negative for fever (99.7 last night).  HENT:  Positive for congestion, ear pain (for a few weeks), rhinorrhea and sore throat.   Eyes:  Negative for visual disturbance.  Respiratory:  Positive for cough (slight). Negative for shortness of breath.   Cardiovascular:  Negative for chest pain.  Gastrointestinal:  Negative for abdominal pain, nausea and vomiting.  Genitourinary:  Negative for difficulty urinating.  Musculoskeletal:  Negative for gait problem and neck pain.  Skin:  Negative for rash.  Neurological:  Positive for headaches (for weeks, on ROS, BC powders, quiet helps, darkness). Negative for syncope, speech difficulty, weakness and numbness.   Physical Exam Updated Vital Signs BP (!) 188/103   Pulse 69   Temp 98 F (36.7 C) (Oral)   Resp 14   LMP 11/19/2021   SpO2 100%   Physical Exam Vitals and nursing note reviewed.  Constitutional:      General: She is not in acute distress.    Appearance: Normal appearance. She is not ill-appearing, toxic-appearing or diaphoretic.  HENT:     Head: Normocephalic.     Comments: Dental  caries, poor dentition tenderness right upper gums, 1cm tender area right maxilla Eyes:     Conjunctiva/sclera: Conjunctivae normal.  Cardiovascular:     Rate and Rhythm: Normal rate and regular rhythm.     Pulses: Normal pulses.  Pulmonary:     Effort: Pulmonary effort is normal. No respiratory distress.  Musculoskeletal:        General: No deformity or signs of injury.     Cervical back: No rigidity.  Skin:    General: Skin is warm and dry.     Coloration: Skin is not jaundiced or pale.  Neurological:     General: No focal deficit present.     Mental Status: She is alert and oriented to person, place, and time.    ED Results /  Procedures / Treatments   Labs (all labs ordered are listed, but only abnormal results are displayed) Labs Reviewed  CBC WITH DIFFERENTIAL/PLATELET - Abnormal; Notable for the following components:      Result Value   RBC 3.84 (*)    Hemoglobin 9.6 (*)    HCT 32.2 (*)    MCH 25.0 (*)    MCHC 29.8 (*)    RDW 15.7 (*)    All other components within normal limits  COMPREHENSIVE METABOLIC PANEL - Abnormal; Notable for the following components:   CO2 21 (*)    Glucose, Bld 253 (*)    Creatinine, Ser 1.04 (*)    Calcium 8.0 (*)    Albumin 2.8 (*)    Alkaline Phosphatase 140 (*)    All other components within normal limits  URINALYSIS, ROUTINE W REFLEX MICROSCOPIC - Abnormal; Notable for the following components:   APPearance CLOUDY (*)    Glucose, UA >=500 (*)    Hgb urine dipstick SMALL (*)    Protein, ur 100 (*)    Leukocytes,Ua TRACE (*)    Bacteria, UA RARE (*)    All other components within normal limits  I-STAT CHEM 8, ED - Abnormal; Notable for the following components:   Glucose, Bld 253 (*)    Calcium, Ion 1.09 (*)    Hemoglobin 10.5 (*)    HCT 31.0 (*)    All other components within normal limits  I-STAT CHEM 8, ED - Abnormal; Notable for the following components:   Glucose, Bld 252 (*)    Calcium, Ion 1.07 (*)    Hemoglobin 11.2  (*)    HCT 33.0 (*)    All other components within normal limits  GROUP A STREP BY PCR  RESP PANEL BY RT-PCR (FLU A&B, COVID) ARPGX2  PREGNANCY, URINE  I-STAT BETA HCG BLOOD, ED ([facility], [facility], [facility] ONLY)  POC URINE PREG, ED    EKG None  Radiology CT Maxillofacial W Contrast  Result Date: 11/22/2021 CLINICAL DATA:  Right maxillary facial pain and swelling. Fever. Rule out abscess. EXAM: CT MAXILLOFACIAL WITH CONTRAST TECHNIQUE: Multidetector CT imaging of the maxillofacial structures was performed with intravenous contrast. Multiplanar CT image reconstructions were also generated. CONTRAST:  70mL OMNIPAQUE IOHEXOL 300 MG/ML  SOLN COMPARISON:  CT head 04/18/2018 FINDINGS: Osseous: Poor dentition with multiple caries. Periapical lucency and cortical erosion right upper molar which appears responsible for a subperiosteal abscess. Additional caries are present bilaterally. Extensive periapical lucency around right lower pre molar. No fracture identified. Orbits: Negative for orbital mass or abscess. Sinuses: Mucosal edema in the maxillary sinus bilaterally. Negative for air-fluid level. Mastoid sinus clear bilaterally. Soft tissues: 12 mm rim enhancing fluid collection adjacent to the right maxilla compatible with soft tissue abscess. There is adjacent cortical erosion of the maxilla due to dental infection. 10 mm right level 2 lymph node. Limited intracranial: Negative IMPRESSION: 12 mm subperiosteal abscess adjacent to the right maxilla. There is adjacent dental infection and cortical erosion leading to subperiosteal abscess. Poor dentition. Electronically Signed   By: Franchot Gallo M.D.   On: 11/22/2021 15:24    Procedures Procedures   Medications Ordered in ED Medications  oxyCODONE-acetaminophen (PERCOCET/ROXICET) 5-325 MG per tablet 1 tablet (1 tablet Oral Given 11/22/21 1528)  fentaNYL (SUBLIMAZE) injection 50 mcg (50 mcg Intravenous Given 11/22/21 1404)  iohexol (OMNIPAQUE) 300 MG/ML solution  75 mL (75 mLs Intravenous Contrast Given 11/22/21 1502)  Ampicillin-Sulbactam (UNASYN) 3 g in sodium chloride 0.9 % 100 mL  IVPB (0 g Intravenous Stopped 11/22/21 1802)    ED Course  I have reviewed the triage vital signs and the nursing notes.  Pertinent labs & imaging results that were available during my care of the patient were reviewed by me and considered in my medical decision making (see chart for details).    MDM Rules/Calculators/A&P                           45yo female with history of DM, htn, presents with concern for right facial swelling and pain.  No sign of DKA.  Strep negative, neg covid/influenza.  CT maxillofacial ordered and pending at time of transfer of care to evaluate for extension of dental infection or other complications.     Final Clinical Impression(s) / ED Diagnoses Final diagnoses:  Dental abscess    Rx / DC Orders ED Discharge Orders          Ordered    amoxicillin-clavulanate (AUGMENTIN) 875-125 MG tablet  Every 12 hours        11/22/21 1730    HYDROcodone-acetaminophen (NORCO/VICODIN) 5-325 MG tablet  Every 6 hours PRN        11/22/21 1730             Gareth Morgan, MD 11/22/21 2157

## 2021-11-22 NOTE — Discharge Instructions (Addendum)
Call the oral surgeon for an appointment.  Try and keep her self hydrated.  Take the pain medicine as needed but be careful because it can be extra sedating with the other medications you are on.  The antibiotic should also help with the infection.

## 2021-11-22 NOTE — ED Provider Notes (Addendum)
Emergency Medicine Provider Triage Evaluation Note  Virginia [name] , a 45 y.o. female  was evaluated in triage.  Pt complains of swelling to the right side of her face.  She states that for 3 to 4 days she has had right upper dental pain and pain in the right cheek.  She also endorses sore throat over the past 2 weeks.  She denies fevers or discharge from her mouth.  She does state that she had a dentist however she states she "does not like to go."  She also states she recently found that her dentist had passed away and will be looking for an new dentist.  Review of Systems  Positive: See above Negative:   Physical Exam  BP (!) 164/101   Pulse 85   Temp 98 F (36.7 C) (Oral)   Resp 16   LMP 11/19/2021   SpO2 100%  Gen:   Awake, no distress   Resp:  Normal effort  MSK:   Moves extremities without difficulty  Other:  Poor dentition.  There are dental caries on the upper and lower teeth.  An upper right molars there is dental caries.  There is redness and what appears to be new abscess formation on the buccal mucosa just across from the teeth.  She has tenderness to palpation of the teeth and the buccal mucosa.  There is mild facial swelling.  Does not appear to have a periapical abscess. Oropharynx with right-sided tonsillar swelling and mild erythema.  Uvula midline.  Medical Decision Making  Medically screening exam initiated at 10:51 AM.  Appropriate orders placed.  Camylle Whicker was informed that the remainder of the evaluation will be completed by another provider, this initial triage assessment does not replace that evaluation, and the importance of remaining in the ED until their evaluation is complete.     Cristopher Peru, PA-C 11/22/21 1054    Cristopher Peru, PA-C 11/22/21 1113    Alvira Monday, MD 11/22/21 2158

## 2021-11-22 NOTE — ED Triage Notes (Signed)
Pt reports swelling to R side of face x 2-3 days with congestion.  States initially started as abscess/swelling to R upper gum over the past 1-2 weeks.

## 2021-11-23 LAB — I-STAT CHEM 8, ED
BUN: 19 mg/dL (ref 6–20)
Calcium, Ion: 1.09 mmol/L — ABNORMAL LOW (ref 1.15–1.40)
Chloride: 110 mmol/L (ref 98–111)
Creatinine, Ser: 1 mg/dL (ref 0.44–1.00)
Glucose, Bld: 253 mg/dL — ABNORMAL HIGH (ref 70–99)
HCT: 31 % — ABNORMAL LOW (ref 36.0–46.0)
Hemoglobin: 10.5 g/dL — ABNORMAL LOW (ref 12.0–15.0)
Potassium: 4.2 mmol/L (ref 3.5–5.1)
Sodium: 140 mmol/L (ref 135–145)
TCO2: 22 mmol/L (ref 22–32)

## 2022-06-20 ENCOUNTER — Emergency Department (HOSPITAL_COMMUNITY): Payer: 59

## 2022-06-20 ENCOUNTER — Encounter (HOSPITAL_COMMUNITY): Payer: Self-pay

## 2022-06-20 ENCOUNTER — Other Ambulatory Visit: Payer: Self-pay

## 2022-06-20 ENCOUNTER — Emergency Department (HOSPITAL_COMMUNITY)
Admission: EM | Admit: 2022-06-20 | Discharge: 2022-06-20 | Disposition: A | Discharge status: Home / Self Care | Payer: 59 | Attending: Emergency Medicine | Admitting: Emergency Medicine

## 2022-06-20 DIAGNOSIS — R300 Dysuria: Secondary | ICD-10-CM | POA: Diagnosis present

## 2022-06-20 DIAGNOSIS — N3000 Acute cystitis without hematuria: Secondary | ICD-10-CM | POA: Diagnosis not present

## 2022-06-20 DIAGNOSIS — Z7984 Long term (current) use of oral hypoglycemic drugs: Secondary | ICD-10-CM | POA: Insufficient documentation

## 2022-06-20 DIAGNOSIS — E119 Type 2 diabetes mellitus without complications: Secondary | ICD-10-CM | POA: Insufficient documentation

## 2022-06-20 HISTORY — DX: Disorder of kidney and ureter, unspecified: N28.9

## 2022-06-20 LAB — CBC WITH DIFFERENTIAL/PLATELET
Abs Immature Granulocytes: 0.01 10*3/uL (ref 0.00–0.07)
Basophils Absolute: 0 10*3/uL (ref 0.0–0.1)
Basophils Relative: 0 %
Eosinophils Absolute: 0 10*3/uL (ref 0.0–0.5)
Eosinophils Relative: 1 %
HCT: 32.8 % — ABNORMAL LOW (ref 36.0–46.0)
Hemoglobin: 10.1 g/dL — ABNORMAL LOW (ref 12.0–15.0)
Immature Granulocytes: 0 %
Lymphocytes Relative: 20 %
Lymphs Abs: 1.2 10*3/uL (ref 0.7–4.0)
MCH: 26.3 pg (ref 26.0–34.0)
MCHC: 30.8 g/dL (ref 30.0–36.0)
MCV: 85.4 fL (ref 80.0–100.0)
Monocytes Absolute: 0.6 10*3/uL (ref 0.1–1.0)
Monocytes Relative: 10 %
Neutro Abs: 4 10*3/uL (ref 1.7–7.7)
Neutrophils Relative %: 69 %
Platelets: 236 10*3/uL (ref 150–400)
RBC: 3.84 MIL/uL — ABNORMAL LOW (ref 3.87–5.11)
RDW: 16.9 % — ABNORMAL HIGH (ref 11.5–15.5)
WBC: 5.8 10*3/uL (ref 4.0–10.5)
nRBC: 0 % (ref 0.0–0.2)

## 2022-06-20 LAB — COMPREHENSIVE METABOLIC PANEL
ALT: 23 U/L (ref 0–44)
AST: 23 U/L (ref 15–41)
Albumin: 2.6 g/dL — ABNORMAL LOW (ref 3.5–5.0)
Alkaline Phosphatase: 125 U/L (ref 38–126)
Anion gap: 12 (ref 5–15)
BUN: 14 mg/dL (ref 6–20)
CO2: 21 mmol/L — ABNORMAL LOW (ref 22–32)
Calcium: 7.8 mg/dL — ABNORMAL LOW (ref 8.9–10.3)
Chloride: 105 mmol/L (ref 98–111)
Creatinine, Ser: 1.24 mg/dL — ABNORMAL HIGH (ref 0.44–1.00)
GFR, Estimated: 55 mL/min — ABNORMAL LOW (ref >60)
Glucose, Bld: 228 mg/dL — ABNORMAL HIGH (ref 70–99)
Potassium: 4 mmol/L (ref 3.5–5.1)
Sodium: 138 mmol/L (ref 135–145)
Total Bilirubin: 0.5 mg/dL (ref 0.3–1.2)
Total Protein: 6.9 g/dL (ref 6.5–8.1)

## 2022-06-20 LAB — URINALYSIS, ROUTINE W REFLEX MICROSCOPIC
Bilirubin Urine: NEGATIVE
Glucose, UA: 150 mg/dL — AB
Hgb urine dipstick: NEGATIVE
Ketones, ur: NEGATIVE mg/dL
Nitrite: NEGATIVE
Protein, ur: >=300 mg/dL — AB
Specific Gravity, Urine: 1.014 (ref 1.005–1.030)
WBC, UA: >50 WBC/hpf — ABNORMAL HIGH (ref 0–5)
pH: 5 (ref 5.0–8.0)

## 2022-06-20 LAB — I-STAT BETA HCG BLOOD, ED (MC, WL, AP ONLY): I-stat hCG, quantitative: <5 m[IU]/mL (ref <5)

## 2022-06-20 MED ORDER — CEPHALEXIN 500 MG PO CAPS: ORAL_CAPSULE | ORAL | 0 refills | Status: DC

## 2022-06-20 NOTE — ED Triage Notes (Signed)
Patient complains of 3 days of lower back pain with dysuria and odor. No nausea, no vomiting, has hx of kidney disease

## 2022-06-20 NOTE — ED Provider Notes (Signed)
[facility] EMERGENCY DEPARTMENT Provider Note   CSN: [id number] Arrival date & time: 06/20/22  0710     History  No chief complaint on file.   Virginia [name] is a 46 y.o. female who presents emergency department with chief complaint of dysuria and back pain.  Patient states that this has been ongoing for 3 days.  She is diabetic.  Patient states that she felt febrile at home.  No nausea or vomiting.  She is been taking Tylenol without relief.  HPI     Home Medications Prior to Admission medications   Medication Sig Start Date End Date Taking? Authorizing Provider  cephALEXin (KEFLEX) 500 MG capsule 2 caps po bid x 7 days 06/20/22  Yes Adiya Selmer, PA-C  albuterol (VENTOLIN HFA) 108 (90 Base) MCG/ACT inhaler Inhale 2 puffs every 4 (four) hours as needed into the lungs for wheezing or shortness of breath.    [provider]  ALPRAZolam Prudy Feeler) 1 MG tablet Take 1 mg by mouth 4 (four) times daily as needed for anxiety or sleep.  02/26/20   [provider]  amoxicillin-clavulanate (AUGMENTIN) 875-125 MG tablet Take 1 tablet by mouth every 12 (twelve) hours. 11/22/21   Benjiman Core, MD  amphetamine-dextroamphetamine (ADDERALL) 20 MG tablet Take 20 mg by mouth 2 (two) times daily. 02/26/20   [provider]  chlorhexidine (HIBICLENS) 4 % external liquid Apply topically daily as needed. Patient not taking: Reported on 11/22/2021 06/28/21   Particia Nearing, PA-C  dimenhyDRINATE (DRAMAMINE) 50 MG tablet Take 50 mg every 6 (six) hours as needed by mouth for nausea.    [provider]  FLUoxetine (PROZAC) 40 MG capsule Take 40 mg by mouth daily. 12/21/19   [provider]  HYDROcodone-acetaminophen (NORCO/VICODIN) 5-325 MG tablet Take 1 tablet by mouth every 6 (six) hours as needed. 11/22/21   Benjiman Core, MD  loratadine (CLARITIN) 10 MG tablet Take 10 mg by mouth daily.    [provider]  metFORMIN  (GLUCOPHAGE) 1000 MG tablet Take 0.5 tablets (500 mg total) by mouth 2 (two) times daily with a meal for 20 days. 06/25/21 07/15/21  Caccavale, Sophia, PA-C  metFORMIN (GLUCOPHAGE) 500 MG tablet Take 500 mg by mouth daily with breakfast.    [provider]  Multiple Vitamin (MULTIVITAMIN WITH MINERALS) TABS tablet Take 1 tablet daily by mouth.    [provider]  multivitamin-iron-minerals-folic acid (CENTRUM) chewable tablet Chew 1 tablet by mouth daily. Patient not taking: Reported on 10/19/2017 02/22/17   Vassie Loll, MD  omeprazole (PRILOSEC) 40 MG capsule Take 40 mg by mouth in the morning and at bedtime. 07/12/17   [provider]  ondansetron (ZOFRAN ODT) 4 MG disintegrating tablet Take 1 tablet (4 mg total) by mouth every 8 (eight) hours as needed for nausea or vomiting. Patient not taking: Reported on 11/22/2021 07/13/19   Ward, Chase Picket, PA-C  promethazine (PHENERGAN) 25 MG tablet Take 1 tablet (25 mg total) by mouth every 6 (six) hours as needed for nausea. Patient not taking: Reported on 10/19/2017 05/16/17   Trixie Dredge, PA-C  vitamin B-12 (CYANOCOBALAMIN) 1000 MCG tablet Take 1 tablet (1,000 mcg total) by mouth daily. Patient not taking: Reported on 10/19/2017 02/22/17   Vassie Loll, MD      Allergies    Ace inhibitors, Ace inhibitors, Lamictal [lamotrigine], Nsaids, and Topiramate    Review of Systems   Review of Systems  Physical Exam Updated Vital Signs BP 126/90 (BP  Location: Left Arm)   Pulse 87   Temp 99.8 F (37.7 C) (Oral)   Resp 20   SpO2 100%  Physical Exam Vitals and nursing note reviewed.  Constitutional:      General: She is not in acute distress.    Appearance: She is well-developed. She is not diaphoretic.  HENT:     Head: Normocephalic and atraumatic.     Right Ear: External ear normal.     Left Ear: External ear normal.     Nose: Nose normal.     Mouth/Throat:     Mouth: Mucous membranes are moist.  Eyes:     General:  No scleral icterus.    Conjunctiva/sclera: Conjunctivae normal.  Cardiovascular:     Rate and Rhythm: Normal rate and regular rhythm.     Heart sounds: Normal heart sounds. No murmur heard.    No friction rub. No gallop.  Pulmonary:     Effort: Pulmonary effort is normal. No respiratory distress.     Breath sounds: Normal breath sounds.  Abdominal:     General: Bowel sounds are normal. There is no distension.     Palpations: Abdomen is soft. There is no mass.     Tenderness: There is no abdominal tenderness. There is no right CVA tenderness, left CVA tenderness or guarding.  Musculoskeletal:     Cervical back: Normal range of motion.  Skin:    General: Skin is warm and dry.  Neurological:     Mental Status: She is alert and oriented to person, place, and time.  Psychiatric:        Behavior: Behavior normal.     ED Results / Procedures / Treatments   Labs (all labs ordered are listed, but only abnormal results are displayed) Labs Reviewed  COMPREHENSIVE METABOLIC PANEL - Abnormal; Notable for the following components:      Result Value   CO2 21 (*)    Glucose, Bld 228 (*)    Creatinine, Ser 1.24 (*)    Calcium 7.8 (*)    Albumin 2.6 (*)    GFR, Estimated 55 (*)    All other components within normal limits  CBC WITH DIFFERENTIAL/PLATELET - Abnormal; Notable for the following components:   RBC 3.84 (*)    Hemoglobin 10.1 (*)    HCT 32.8 (*)    RDW 16.9 (*)    All other components within normal limits  URINALYSIS, ROUTINE W REFLEX MICROSCOPIC - Abnormal; Notable for the following components:   APPearance CLOUDY (*)    Glucose, UA 150 (*)    Protein, ur >=300 (*)    Leukocytes,Ua MODERATE (*)    WBC, UA >50 (*)    Bacteria, UA RARE (*)    All other components within normal limits  LACTIC ACID, PLASMA  LACTIC ACID, PLASMA  I-STAT BETA HCG BLOOD, ED ([facility], [facility], [facility] ONLY)    EKG None  Radiology DG Chest 2 View  Result Date: 06/20/2022 CLINICAL DATA:  Fever and dysuria  EXAM: CHEST - 2 VIEW COMPARISON:  03/27/2020 FINDINGS: Normal heart size and mediastinal contours. No acute infiltrate or edema. No effusion or pneumothorax. No acute osseous findings. Cholecystectomy clips. IMPRESSION: No evidence of active disease. Electronically Signed   By: Tiburcio Pea M.D.   On: 06/20/2022 08:02    Procedures Procedures    Medications Ordered in ED Medications - No data to display  ED Course/ Medical Decision Making/ A&P Clinical Course as of 06/20/22 1550  Sun Jun 20, 2022  1547 I reviewed micro reports urine culture and sensitivities previous shows E. coli which was pansensitive [AH]    Clinical Course User Index [AH] Arthor Captain, PA-C                           Medical Decision Making Patient here with symptoms of UTI.  Review of labs shows patient does have a urinary tract infection.  She is afebrile and hemodynamically stable here without flank pain or CVA tenderness.  CBC without elevated white blood cell count.  Review of EMR shows previous culture and sensitivity reports pan sensitive.  I doubt any other cause of patient's symptoms today such as pyelonephritis, infected kidney stone, vaginitis.  Patient appears otherwise appropriate for discharge.   Amount and/or Complexity of Data Reviewed Labs: ordered.    Details: I reviewed all labs and interpreted them Radiology: ordered.    Details: I reviewed two-view chest x-ray and independently interpreted images, no acute findings           Final Clinical Impression(s) / ED Diagnoses Final diagnoses:  Acute cystitis without hematuria    Rx / DC Orders ED Discharge Orders          Ordered    cephALEXin (KEFLEX) 500 MG capsule        06/20/22 1548              Arthor Captain, PA-C 06/20/22 1550    Ernie Avena, MD 06/20/22 1651

## 2022-06-20 NOTE — Discharge Instructions (Signed)
Contact a health care provider if: Your symptoms do not get better after 1-2 days. Your symptoms go away and then return. Get help right away if: You have severe pain in your back or your lower abdomen. You have a fever or chills. You have nausea or vomiting. 

## 2022-07-13 ENCOUNTER — Ambulatory Visit (HOSPITAL_COMMUNITY)
Admission: RE | Admit: 2022-07-13 | Discharge: 2022-07-13 | Disposition: A | Discharge status: Home / Self Care | Payer: 59 | Source: Ambulatory Visit | Attending: Internal Medicine | Admitting: Internal Medicine

## 2022-07-13 ENCOUNTER — Inpatient Hospital Stay (HOSPITAL_COMMUNITY)
Admission: EM | Admit: 2022-07-13 | Discharge: 2022-07-16 | DRG: 683 | Disposition: A | Discharge status: Home / Self Care | Payer: 59 | Attending: Internal Medicine | Admitting: Internal Medicine

## 2022-07-13 ENCOUNTER — Encounter (HOSPITAL_COMMUNITY): Payer: Self-pay | Admitting: Internal Medicine

## 2022-07-13 ENCOUNTER — Other Ambulatory Visit: Payer: Self-pay

## 2022-07-13 ENCOUNTER — Observation Stay (HOSPITAL_COMMUNITY): Payer: 59

## 2022-07-13 ENCOUNTER — Emergency Department (HOSPITAL_COMMUNITY): Payer: 59

## 2022-07-13 ENCOUNTER — Encounter (HOSPITAL_COMMUNITY): Payer: Self-pay

## 2022-07-13 VITALS — BP 154/84 | HR 99 | Temp 98.2°F | Resp 18 | Ht 61.0 in | Wt 200.0 lb

## 2022-07-13 DIAGNOSIS — R197 Diarrhea, unspecified: Secondary | ICD-10-CM

## 2022-07-13 DIAGNOSIS — R000 Tachycardia, unspecified: Secondary | ICD-10-CM | POA: Diagnosis not present

## 2022-07-13 DIAGNOSIS — Z7984 Long term (current) use of oral hypoglycemic drugs: Secondary | ICD-10-CM

## 2022-07-13 DIAGNOSIS — Z825 Family history of asthma and other chronic lower respiratory diseases: Secondary | ICD-10-CM

## 2022-07-13 DIAGNOSIS — E1122 Type 2 diabetes mellitus with diabetic chronic kidney disease: Secondary | ICD-10-CM | POA: Diagnosis present

## 2022-07-13 DIAGNOSIS — K5900 Constipation, unspecified: Secondary | ICD-10-CM | POA: Diagnosis present

## 2022-07-13 DIAGNOSIS — Z888 Allergy status to other drugs, medicaments and biological substances status: Secondary | ICD-10-CM

## 2022-07-13 DIAGNOSIS — F32A Depression, unspecified: Secondary | ICD-10-CM | POA: Diagnosis present

## 2022-07-13 DIAGNOSIS — I129 Hypertensive chronic kidney disease with stage 1 through stage 4 chronic kidney disease, or unspecified chronic kidney disease: Secondary | ICD-10-CM | POA: Diagnosis present

## 2022-07-13 DIAGNOSIS — M545 Low back pain, unspecified: Secondary | ICD-10-CM | POA: Diagnosis present

## 2022-07-13 DIAGNOSIS — Z6837 Body mass index (BMI) 37.0-37.9, adult: Secondary | ICD-10-CM

## 2022-07-13 DIAGNOSIS — N179 Acute kidney failure, unspecified: Principal | ICD-10-CM | POA: Diagnosis present

## 2022-07-13 DIAGNOSIS — R1031 Right lower quadrant pain: Secondary | ICD-10-CM

## 2022-07-13 DIAGNOSIS — Z833 Family history of diabetes mellitus: Secondary | ICD-10-CM

## 2022-07-13 DIAGNOSIS — Z9884 Bariatric surgery status: Secondary | ICD-10-CM

## 2022-07-13 DIAGNOSIS — G8921 Chronic pain due to trauma: Secondary | ICD-10-CM | POA: Diagnosis present

## 2022-07-13 DIAGNOSIS — N1832 Chronic kidney disease, stage 3b: Secondary | ICD-10-CM | POA: Diagnosis present

## 2022-07-13 DIAGNOSIS — E860 Dehydration: Secondary | ICD-10-CM | POA: Diagnosis present

## 2022-07-13 DIAGNOSIS — F419 Anxiety disorder, unspecified: Secondary | ICD-10-CM | POA: Diagnosis present

## 2022-07-13 DIAGNOSIS — Z9049 Acquired absence of other specified parts of digestive tract: Secondary | ICD-10-CM

## 2022-07-13 DIAGNOSIS — Z79899 Other long term (current) drug therapy: Secondary | ICD-10-CM

## 2022-07-13 DIAGNOSIS — E872 Acidosis, unspecified: Secondary | ICD-10-CM | POA: Diagnosis present

## 2022-07-13 DIAGNOSIS — D509 Iron deficiency anemia, unspecified: Secondary | ICD-10-CM | POA: Diagnosis present

## 2022-07-13 DIAGNOSIS — Z8249 Family history of ischemic heart disease and other diseases of the circulatory system: Secondary | ICD-10-CM

## 2022-07-13 DIAGNOSIS — R1013 Epigastric pain: Secondary | ICD-10-CM

## 2022-07-13 DIAGNOSIS — N189 Chronic kidney disease, unspecified: Secondary | ICD-10-CM | POA: Diagnosis present

## 2022-07-13 DIAGNOSIS — E1165 Type 2 diabetes mellitus with hyperglycemia: Secondary | ICD-10-CM | POA: Diagnosis present

## 2022-07-13 DIAGNOSIS — D500 Iron deficiency anemia secondary to blood loss (chronic): Secondary | ICD-10-CM | POA: Diagnosis present

## 2022-07-13 DIAGNOSIS — K580 Irritable bowel syndrome with diarrhea: Secondary | ICD-10-CM | POA: Diagnosis present

## 2022-07-13 DIAGNOSIS — R748 Abnormal levels of other serum enzymes: Secondary | ICD-10-CM | POA: Diagnosis present

## 2022-07-13 DIAGNOSIS — E669 Obesity, unspecified: Secondary | ICD-10-CM | POA: Diagnosis present

## 2022-07-13 DIAGNOSIS — E440 Moderate protein-calorie malnutrition: Secondary | ICD-10-CM | POA: Diagnosis present

## 2022-07-13 DIAGNOSIS — Z87891 Personal history of nicotine dependence: Secondary | ICD-10-CM

## 2022-07-13 LAB — URINALYSIS, ROUTINE W REFLEX MICROSCOPIC
Bilirubin Urine: NEGATIVE
Glucose, UA: 150 mg/dL — AB
Hgb urine dipstick: NEGATIVE
Ketones, ur: NEGATIVE mg/dL
Nitrite: NEGATIVE
Protein, ur: 100 mg/dL — AB
Specific Gravity, Urine: 1.014 (ref 1.005–1.030)
pH: 5 (ref 5.0–8.0)

## 2022-07-13 LAB — CBC WITH DIFFERENTIAL/PLATELET
Abs Immature Granulocytes: 0.03 10*3/uL (ref 0.00–0.07)
Basophils Absolute: 0.1 10*3/uL (ref 0.0–0.1)
Basophils Relative: 1 %
Eosinophils Absolute: 0.2 10*3/uL (ref 0.0–0.5)
Eosinophils Relative: 2 %
HCT: 31.5 % — ABNORMAL LOW (ref 36.0–46.0)
Hemoglobin: 9.9 g/dL — ABNORMAL LOW (ref 12.0–15.0)
Immature Granulocytes: 0 %
Lymphocytes Relative: 35 %
Lymphs Abs: 3.6 10*3/uL (ref 0.7–4.0)
MCH: 26.6 pg (ref 26.0–34.0)
MCHC: 31.4 g/dL (ref 30.0–36.0)
MCV: 84.7 fL (ref 80.0–100.0)
Monocytes Absolute: 0.7 10*3/uL (ref 0.1–1.0)
Monocytes Relative: 7 %
Neutro Abs: 5.9 10*3/uL (ref 1.7–7.7)
Neutrophils Relative %: 55 %
Platelets: 334 10*3/uL (ref 150–400)
RBC: 3.72 MIL/uL — ABNORMAL LOW (ref 3.87–5.11)
RDW: 16.9 % — ABNORMAL HIGH (ref 11.5–15.5)
WBC: 10.5 10*3/uL (ref 4.0–10.5)
nRBC: 0 % (ref 0.0–0.2)

## 2022-07-13 LAB — COMPREHENSIVE METABOLIC PANEL
ALT: 26 U/L (ref 0–44)
AST: 19 U/L (ref 15–41)
Albumin: 3.2 g/dL — ABNORMAL LOW (ref 3.5–5.0)
Alkaline Phosphatase: 154 U/L — ABNORMAL HIGH (ref 38–126)
Anion gap: 11 (ref 5–15)
BUN: 40 mg/dL — ABNORMAL HIGH (ref 6–20)
CO2: 15 mmol/L — ABNORMAL LOW (ref 22–32)
Calcium: 7.3 mg/dL — ABNORMAL LOW (ref 8.9–10.3)
Chloride: 109 mmol/L (ref 98–111)
Creatinine, Ser: 2.23 mg/dL — ABNORMAL HIGH (ref 0.44–1.00)
GFR, Estimated: 27 mL/min — ABNORMAL LOW (ref >60)
Glucose, Bld: 287 mg/dL — ABNORMAL HIGH (ref 70–99)
Potassium: 3.8 mmol/L (ref 3.5–5.1)
Sodium: 135 mmol/L (ref 135–145)
Total Bilirubin: 0.4 mg/dL (ref 0.3–1.2)
Total Protein: 7.3 g/dL (ref 6.5–8.1)

## 2022-07-13 LAB — HEMOGLOBIN A1C
Hgb A1c MFr Bld: 11.2 % — ABNORMAL HIGH (ref 4.8–5.6)
Mean Plasma Glucose: 274.74 mg/dL

## 2022-07-13 LAB — I-STAT BETA HCG BLOOD, ED (MC, WL, AP ONLY): I-stat hCG, quantitative: <5 m[IU]/mL (ref <5)

## 2022-07-13 LAB — LIPASE, BLOOD: Lipase: 27 U/L (ref 11–51)

## 2022-07-13 LAB — CBG MONITORING, ED: Glucose-Capillary: 226 mg/dL — ABNORMAL HIGH (ref 70–99)

## 2022-07-13 MED ORDER — OXYCODONE HCL 5 MG PO TABS
5.0000 mg | ORAL_TABLET | Freq: Four times a day (QID) | ORAL | Status: AC | PRN
  Administered 2022-07-14 – 2022-07-15 (×3): 5 mg via ORAL
  Filled 2022-07-13 (×3): qty 1

## 2022-07-13 MED ORDER — MELATONIN 5 MG PO TABS
5.0000 mg | ORAL_TABLET | Freq: Every evening | ORAL | Status: DC | PRN
  Administered 2022-07-14 – 2022-07-15 (×2): 5 mg via ORAL
  Filled 2022-07-13 (×2): qty 1

## 2022-07-13 MED ORDER — INSULIN ASPART 100 UNIT/ML IJ SOLN
0.0000 [IU] | Freq: Three times a day (TID) | INTRAMUSCULAR | Status: DC
  Administered 2022-07-14: 2 [IU] via SUBCUTANEOUS
  Administered 2022-07-14: 5 [IU] via SUBCUTANEOUS
  Administered 2022-07-15: 2 [IU] via SUBCUTANEOUS
  Administered 2022-07-15 – 2022-07-16 (×3): 1 [IU] via SUBCUTANEOUS

## 2022-07-13 MED ORDER — HYDROMORPHONE HCL 1 MG/ML IJ SOLN
0.5000 mg | INTRAMUSCULAR | Status: AC | PRN
  Administered 2022-07-14 (×3): 0.5 mg via INTRAVENOUS
  Filled 2022-07-13 (×3): qty 1

## 2022-07-13 MED ORDER — INSULIN ASPART 100 UNIT/ML IJ SOLN
0.0000 [IU] | Freq: Every day | INTRAMUSCULAR | Status: DC
  Administered 2022-07-13: 2 [IU] via SUBCUTANEOUS

## 2022-07-13 MED ORDER — ACETAMINOPHEN 325 MG PO TABS: 650.0000 mg | ORAL_TABLET | Freq: Four times a day (QID) | ORAL | Status: DC | PRN

## 2022-07-13 MED ORDER — ONDANSETRON HCL 4 MG/2ML IJ SOLN
4.0000 mg | Freq: Once | INTRAMUSCULAR | Status: AC
  Administered 2022-07-13: 4 mg via INTRAVENOUS
  Filled 2022-07-13: qty 2

## 2022-07-13 MED ORDER — ADULT MULTIVITAMIN W/MINERALS CH
1.0000 | ORAL_TABLET | Freq: Every day | ORAL | Status: DC
  Administered 2022-07-13 – 2022-07-15 (×3): 1 via ORAL
  Filled 2022-07-13 (×3): qty 1

## 2022-07-13 MED ORDER — MORPHINE SULFATE (PF) 4 MG/ML IV SOLN
4.0000 mg | Freq: Once | INTRAVENOUS | Status: AC
  Administered 2022-07-13: 4 mg via INTRAVENOUS
  Filled 2022-07-13: qty 1

## 2022-07-13 MED ORDER — LACTATED RINGERS IV BOLUS
1000.0000 mL | Freq: Once | INTRAVENOUS | Status: AC
  Administered 2022-07-13: 1000 mL via INTRAVENOUS

## 2022-07-13 MED ORDER — SODIUM BICARBONATE 650 MG PO TABS
1300.0000 mg | ORAL_TABLET | Freq: Two times a day (BID) | ORAL | Status: AC
Start: 2022-07-13 — End: 2022-07-14
  Administered 2022-07-13 – 2022-07-14 (×2): 1300 mg via ORAL
  Filled 2022-07-13 (×2): qty 2

## 2022-07-13 MED ORDER — SODIUM CHLORIDE 0.9 % IV SOLN
12.5000 mg | INTRAVENOUS | Status: AC
  Administered 2022-07-14: 12.5 mg via INTRAVENOUS
  Filled 2022-07-13: qty 0.5

## 2022-07-13 MED ORDER — CALCIUM GLUCONATE-NACL 1-0.675 GM/50ML-% IV SOLN
1.0000 g | Freq: Once | INTRAVENOUS | Status: AC
  Administered 2022-07-13: 1000 mg via INTRAVENOUS
  Filled 2022-07-13: qty 50

## 2022-07-13 MED ORDER — LACTATED RINGERS IV SOLN: INTRAVENOUS | Status: AC

## 2022-07-13 NOTE — ED Triage Notes (Signed)
Pt sent here by UC for RLQ abd pain x3 days and diarrhea x7 days. Pt reports some nausea but "very little" vomiting. Pt recently had UTI and was on antibiotics. Pt reports fevers/chills. Hx of gastric bypass.

## 2022-07-13 NOTE — ED Provider Triage Note (Signed)
Emergency Medicine Provider Triage Evaluation Note  Virginia [name] , a 46 y.o. female  was evaluated in triage.  Pt complains of abdominal pain and diarrhea x1 week.  History of gastric bypass and cholecystectomy.  Admits to possible blood in diarrhea however, unsure due to "orange" nature diarrhea.  Admits to nausea.  Admits to feeling feverish however, notes temperature has not reached over 100 F.  Patient was evaluated urgent care prior to travel and sent to the ED for further evaluation due to concerns about dehydration and right lower quadrant abdominal pain.  Denies vaginal and urinary symptoms.  Recently had a UTI and finished antibiotics roughly 2 weeks ago.  No further dysuria.  Review of Systems  Positive: Abdominal pain, diarrhea Negative: dysuria  Physical Exam  LMP 06/12/2022 (Exact Date)  Gen:   Awake, no distress   Resp:  Normal effort  MSK:   Moves extremities without difficulty  Other:  +RLQ pain +rebound  Medical Decision Making  Medically screening exam initiated at 4:49 PM.  Appropriate orders placed.  Sidnee Gambrill was informed that the remainder of the evaluation will be completed by another provider, this initial triage assessment does not replace that evaluation, and the importance of remaining in the ED until their evaluation is complete.  Abdominal pain + diarrhea hx of gastric bypass. Abdominal labs CT abdomen to rule out appendicitis due to RLQ tenderness and rebound Normal vitals in triage   Mannie Stabile, PA-C 07/13/22 1708
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
## 2022-07-13 NOTE — ED Provider Notes (Signed)
[facility] EMERGENCY DEPARTMENT Provider Note   CSN: [id number] Arrival date & time: 07/13/22  1624     History  Chief Complaint  Patient presents with   Abdominal Pain   Diarrhea    Virginia [name] is a 46 y.o. female.   Abdominal Pain Associated symptoms: diarrhea   Diarrhea Associated symptoms: abdominal pain     Patient with medical history of hypertension, type 2 diabetes, tobacco abuse presents today due to abdominal pain and diarrhea.  Patient has been having diarrhea for the last week, there is mucus in the diarrhea.  The diarrhea itself looks orange, there are streaks of blood.  She is having up to 3 episodes of diarrhea an hour as of today.  4 days ago showed having right lower quadrant pain.  The pain was dull, today it is actually sharp and constant.  Does not radiate elsewhere, denies any pelvic pain or vaginal bleeding.  She is nauseated but has not vomited.  Seen at urgent care, sent to ED for further evaluation.  Patient denies any recent travel, no sick contact that she is aware of.  Status post gastric bypass, C-section, cholecystectomy.  Patient was treated for UTI few weeks ago, finished the Keflex 2 weeks ago.  Home Medications Prior to Admission medications   Medication Sig Start Date End Date Taking? Authorizing Provider  albuterol (VENTOLIN HFA) 108 (90 Base) MCG/ACT inhaler Inhale 2 puffs every 4 (four) hours as needed into the lungs for wheezing or shortness of breath.    [provider]  ALPRAZolam Duanne Moron) 1 MG tablet Take 1 mg by mouth 4 (four) times daily as needed for anxiety or sleep.  02/26/20   [provider]  amoxicillin-clavulanate (AUGMENTIN) 875-125 MG tablet Take 1 tablet by mouth every 12 (twelve) hours. 11/22/21   Davonna Belling, MD  amphetamine-dextroamphetamine (ADDERALL) 20 MG tablet Take 20 mg by mouth 2 (two) times daily. 02/26/20   [provider]  cephALEXin (KEFLEX) 500 MG  capsule 2 caps po bid x 7 days 06/20/22   Margarita Mail, PA-C  chlorhexidine (HIBICLENS) 4 % external liquid Apply topically daily as needed. Patient not taking: Reported on 11/22/2021 06/28/21   Volney American, PA-C  dimenhyDRINATE (DRAMAMINE) 50 MG tablet Take 50 mg every 6 (six) hours as needed by mouth for nausea.    [provider]  FLUoxetine (PROZAC) 40 MG capsule Take 40 mg by mouth daily. 12/21/19   [provider]  HYDROcodone-acetaminophen (NORCO/VICODIN) 5-325 MG tablet Take 1 tablet by mouth every 6 (six) hours as needed. 11/22/21   Davonna Belling, MD  loratadine (CLARITIN) 10 MG tablet Take 10 mg by mouth daily.    [provider]  metFORMIN (GLUCOPHAGE) 1000 MG tablet Take 0.5 tablets (500 mg total) by mouth 2 (two) times daily with a meal for 20 days. 06/25/21 07/15/21  Caccavale, Sophia, PA-C  metFORMIN (GLUCOPHAGE) 500 MG tablet Take 500 mg by mouth daily with breakfast.    [provider]  Multiple Vitamin (MULTIVITAMIN WITH MINERALS) TABS tablet Take 1 tablet daily by mouth.    [provider]  multivitamin-iron-minerals-folic acid (CENTRUM) chewable tablet Chew 1 tablet by mouth daily. Patient not taking: Reported on 10/19/2017 02/22/17   Barton Dubois, MD  omeprazole (PRILOSEC) 40 MG capsule Take 40 mg by mouth in the morning and at bedtime. 07/12/17   [provider]  ondansetron (ZOFRAN ODT) 4 MG disintegrating tablet Take 1 tablet (4 mg total) by mouth  every 8 (eight) hours as needed for nausea or vomiting. Patient not taking: Reported on 11/22/2021 07/13/19   Ward, Ozella Almond, PA-C  promethazine (PHENERGAN) 25 MG tablet Take 1 tablet (25 mg total) by mouth every 6 (six) hours as needed for nausea. Patient not taking: Reported on 10/19/2017 05/16/17   Clayton Bibles, PA-C  vitamin B-12 (CYANOCOBALAMIN) 1000 MCG tablet Take 1 tablet (1,000 mcg total) by mouth daily. Patient not taking: Reported on 10/19/2017 02/22/17    Barton Dubois, MD      Allergies    Ace inhibitors, Ace inhibitors, Lamictal [lamotrigine], Nsaids, and Topiramate    Review of Systems   Review of Systems  Gastrointestinal:  Positive for abdominal pain and diarrhea.    Physical Exam Updated Vital Signs BP 135/86 (BP Location: Left Arm)   Pulse 96   Temp 98.1 F (36.7 C) (Oral)   Resp 19   LMP 06/12/2022 (Exact Date)   SpO2 99%  Physical Exam Vitals and nursing note reviewed. Exam conducted with a chaperone present.  Constitutional:      Appearance: Normal appearance.  HENT:     Head: Normocephalic and atraumatic.  Eyes:     General: No scleral icterus.       Right eye: No discharge.        Left eye: No discharge.     Extraocular Movements: Extraocular movements intact.     Pupils: Pupils are equal, round, and reactive to light.  Cardiovascular:     Rate and Rhythm: Normal rate and regular rhythm.     Pulses: Normal pulses.     Heart sounds: Normal heart sounds. No murmur heard.    No friction rub. No gallop.  Pulmonary:     Effort: Pulmonary effort is normal. No respiratory distress.     Breath sounds: Normal breath sounds.  Abdominal:     General: Abdomen is flat. Bowel sounds are normal. There is no distension.     Palpations: Abdomen is soft.     Tenderness: There is abdominal tenderness in the right lower quadrant. There is guarding and rebound.  Skin:    General: Skin is warm and dry.     Coloration: Skin is not jaundiced.  Neurological:     Mental Status: She is alert. Mental status is at baseline.     Coordination: Coordination normal.     ED Results / Procedures / Treatments   Labs (all labs ordered are listed, but only abnormal results are displayed) Labs Reviewed  CBC WITH DIFFERENTIAL/PLATELET - Abnormal; Notable for the following components:      Result Value   RBC 3.72 (*)    Hemoglobin 9.9 (*)    HCT 31.5 (*)    RDW 16.9 (*)    All other components within normal limits  COMPREHENSIVE  METABOLIC PANEL - Abnormal; Notable for the following components:   CO2 15 (*)    Glucose, Bld 287 (*)    BUN 40 (*)    Creatinine, Ser 2.23 (*)    Calcium 7.3 (*)    Albumin 3.2 (*)    Alkaline Phosphatase 154 (*)    GFR, Estimated 27 (*)    All other components within normal limits  URINALYSIS, ROUTINE W REFLEX MICROSCOPIC - Abnormal; Notable for the following components:   APPearance HAZY (*)    Glucose, UA 150 (*)    Protein, ur 100 (*)    Leukocytes,Ua TRACE (*)    Bacteria, UA RARE (*)    All  other components within normal limits  HEMOGLOBIN A1C - Abnormal; Notable for the following components:   Hgb A1c MFr Bld 11.2 (*)    All other components within normal limits  CBG MONITORING, ED - Abnormal; Notable for the following components:   Glucose-Capillary 226 (*)    All other components within normal limits  C DIFFICILE QUICK SCREEN W PCR REFLEX    GASTROINTESTINAL PANEL BY PCR, STOOL (REPLACES STOOL CULTURE)  LIPASE, BLOOD  CBC WITH DIFFERENTIAL/PLATELET  COMPREHENSIVE METABOLIC PANEL  MAGNESIUM  PHOSPHORUS  HIV ANTIBODY (ROUTINE TESTING W REFLEX)  IRON AND TIBC  FERRITIN  I-STAT BETA HCG BLOOD, ED ([facility], [facility], [facility] ONLY)    EKG None  Radiology DG Lumbar Spine 2-3 Views  Result Date: 07/13/2022 CLINICAL DATA:  Right lower quadrant pain for several days with diarrhea, initial encounter EXAM: LUMBAR SPINE - 2-3 VIEW COMPARISON:  CT from earlier in the same day. FINDINGS: Five lumbar type vertebral bodies are well visualized. Vertebral body height is well maintained. No anterolisthesis is seen. No soft tissue abnormality is noted. IMPRESSION: Unremarkable lumbar spine. No significant interval change from CT obtained 2 hours previous. Electronically Signed   By: Inez Catalina M.D.   On: 07/13/2022 21:51   CT ABDOMEN PELVIS WO CONTRAST  Result Date: 07/13/2022 CLINICAL DATA:  Right lower quadrant abdominal pain for 3 days. Diarrhea for 7 days. Antibiotics for urinary tract  infection. Fevers and chills. EXAM: CT ABDOMEN AND PELVIS WITHOUT CONTRAST TECHNIQUE: Multidetector CT imaging of the abdomen and pelvis was performed following the standard protocol without IV contrast. RADIATION DOSE REDUCTION: This exam was performed according to the departmental dose-optimization program which includes automated exposure control, adjustment of the mA and/or kV according to patient size and/or use of iterative reconstruction technique. COMPARISON:  05/25/2017 FINDINGS: Lower chest: Lung bases are clear. Hepatobiliary: No focal liver abnormality is seen. Status post cholecystectomy. No biliary dilatation. Pancreas: Unremarkable. No pancreatic ductal dilatation or surrounding inflammatory changes. Spleen: Normal in size without focal abnormality. Adrenals/Urinary Tract: Adrenal glands are unremarkable. Kidneys are normal, without renal calculi, focal lesion, or hydronephrosis. Bladder is unremarkable. Stomach/Bowel: Postoperative changes consistent with gastric bypass. Stomach, small bowel, and colon are not abnormally distended. No wall thickening or inflammatory changes are appreciated. Appendix is normal. Vascular/Lymphatic: No significant vascular findings are present. No enlarged abdominal or pelvic lymph nodes. Reproductive: Uterus and bilateral adnexa are unremarkable. Other: No abdominal wall hernia or abnormality. No abdominopelvic ascites. Musculoskeletal: No acute or significant osseous findings. IMPRESSION: 1. No evidence of bowel obstruction or inflammation. Appendix is normal. 2. Postoperative gastric bypass. 3. No acute abnormalities demonstrated. Electronically Signed   By: Lucienne Capers M.D.   On: 07/13/2022 19:19    Procedures Procedures    Medications Ordered in ED Medications  acetaminophen (TYLENOL) tablet 650 mg (has no administration in time range)  oxyCODONE (Oxy IR/ROXICODONE) immediate release tablet 5 mg (has no administration in time range)  HYDROmorphone  (DILAUDID) injection 0.5 mg (has no administration in time range)  melatonin tablet 5 mg (has no administration in time range)  insulin aspart (novoLOG) injection 0-9 Units (has no administration in time range)  insulin aspart (novoLOG) injection 0-5 Units (2 Units Subcutaneous Given 07/13/22 2232)  lactated ringers infusion (has no administration in time range)  sodium bicarbonate tablet 1,300 mg (1,300 mg Oral Given 07/13/22 2222)  calcium gluconate 1 g/ 50 mL sodium chloride IVPB (1,000 mg Intravenous New Bag/Given 07/13/22 2222)  multivitamin with minerals tablet 1  tablet (1 tablet Oral Given 07/13/22 2222)  lactated ringers bolus 1,000 mL (1,000 mLs Intravenous New Bag/Given 07/13/22 2030)  morphine (PF) 4 MG/ML injection 4 mg (4 mg Intravenous Given 07/13/22 2031)  ondansetron (ZOFRAN) injection 4 mg (4 mg Intravenous Given 07/13/22 2031)    ED Course/ Medical Decision Making/ A&P Clinical Course as of 07/13/22 2236  Tue Jul 13, 2022  1842 Creatinine(!): 2.23 [CA]    Clinical Course User Index [CA] Suzy Bouchard, PA-C                           Medical Decision Making Risk Prescription drug management. Decision regarding hospitalization.   Patient presents due to abdominal pain and diarrhea.  Differential includes but not limited to appendicitis, AKI, ruptured Pregnancy, gross electrolyte derangement, DKA, gastroenteritis, C. difficile, ovarian torsion, colitis, UTI, pyelonephritis.   On exam patient has right lower quadrant tenderness with guarding and rebound.  Her heart rate is elevated at 96, she is not hypoxic, febrile.  Does not meet SIRS criteria.  I ordered and viewed laboratory work-up.  Per my interpretation  CBC: No leukocytosis, stable anemia with hemoglobin 9.9 which is not grossly changed compared to 3 weeks ago.  Per chart review patient is chronically anemic. CMP with marked decreased bicarb at 15, patient is hyperglycemic at 287.  Patient has a new AKI of 2.23.   Patient's creatinine was elevated at 1.24 3 weeks ago which was already above baseline.  Alk phos is elevated but no transaminitis.  Sodium, chloride and potassium are all within normal limits. Not pregnant, not a ruptured ectopic pregnancy. UA with trace leukocytes but not consistent with a new UTI Lipase neg.  CT abdomen pelvis without contrast viewed by myself.  Per my interpretation no acute process.  Agree with radiologist interpretation.  I ordered lactated Ringer's, pain medicine and antiemetics.  I reviewed patient's home medicine.  Patient has a new AKI, suspect likely secondary to recent cephalosporin use from the UTI.  GI loss is also in the differential.  No surgical abdomen on CT scan.  I think patient would benefit from admission for AKI.  Will consult hospitalist service.        Final Clinical Impression(s) / ED Diagnoses Final diagnoses:  AKI (acute kidney injury) ([place])  Diarrhea, unspecified type    Rx / DC Orders ED Discharge Orders     None         Sherrill Raring, PA-C 07/13/22 2236    Lennice Sites, DO 07/13/22 2256

## 2022-07-13 NOTE — ED Provider Notes (Signed)
[facility]    CSN: [id number] Arrival date & time: 07/13/22  1533      History   Chief Complaint Chief Complaint  Patient presents with   Abdominal Pain    Extreme excess or orange brown mucus in bowel movements times one week plus diarrhea, nausea and lower right severe abdominal pain, consistently consistently and progressively worsening over one week. - Entered by patient   Diarrhea    HPI Virginia [name] is a 46 y.o. female. Patient having abdominal pain and diarrhea for a week. Pain is worst in RLQ. Patient has a history of Gastric bypass and gall bladder removal. Patient does have history of diarrhea and GI issues.    In the diarrhea it is completely liquid and the amount of orange mucus in the stool has grown. She had noted bright red blood in her stool. Episodes of diarrhea daily are in the double digits, 2-3 episodes an hour with taking imodium. Denies vomiting, reports nausea. Reports she thinks she is dehydrated, states feels lightheaded.    Patient was recently on antibiotics for UTI. Has been off them for 2 weeks.    No dietary or medication changes, no one around the Patient with the same symptoms. Reports was eating regular diet (small frequent meals because of gastric bypass) until last night when she felt too poorly to eat     Abdominal Pain Associated symptoms: diarrhea   Diarrhea Associated symptoms: abdominal pain     Past Medical History:  Diagnosis Date   Anxiety    Arthritis    Bipolar affective (HCC)    Depression    Diabetes mellitus    Hypertension    Renal disorder     Patient Active Problem List   Diagnosis Date Noted   Urinary tract infection without hematuria    Dehydration    Gastroesophageal reflux disease    Class 2 obesity due to excess calories with body mass index (BMI) of 39.0 to 39.9 in adult    Hypokalemia    Hypomagnesemia    Anxiety 02/20/2017   Diabetes mellitus without complication (HCC) 02/20/2017    Sepsis (HCC) 02/20/2017   Abdominal pain 02/20/2017   Nausea & vomiting 02/20/2017   Tobacco abuse 02/20/2017   Flank pain 02/20/2017   Hypertension    Lactic acidosis     Past Surgical History:  Procedure Laterality Date   CESAREAN SECTION     CHOLECYSTECTOMY     GASTRIC BYPASS     KNEE SURGERY      OB History   No obstetric history on file.      Home Medications    Prior to Admission medications   Medication Sig Start Date End Date Taking? Authorizing Provider  ALPRAZolam Prudy Feeler) 1 MG tablet Take 1 mg by mouth 4 (four) times daily as needed for anxiety or sleep.  02/26/20  Yes [provider]  amphetamine-dextroamphetamine (ADDERALL) 20 MG tablet Take 20 mg by mouth 2 (two) times daily. 02/26/20  Yes [provider]  FLUoxetine (PROZAC) 40 MG capsule Take 40 mg by mouth daily. 12/21/19  Yes [provider]  metFORMIN (GLUCOPHAGE) 500 MG tablet Take 500 mg by mouth daily with breakfast.   Yes [provider]  albuterol (VENTOLIN HFA) 108 (90 Base) MCG/ACT inhaler Inhale 2 puffs every 4 (four) hours as needed into the lungs for wheezing or shortness of breath.    [provider]  amoxicillin-clavulanate (AUGMENTIN) 875-125 MG tablet Take 1 tablet by  mouth every 12 (twelve) hours. 11/22/21   Benjiman Core, MD  cephALEXin (KEFLEX) 500 MG capsule 2 caps po bid x 7 days 06/20/22   Arthor Captain, PA-C  chlorhexidine (HIBICLENS) 4 % external liquid Apply topically daily as needed. Patient not taking: Reported on 11/22/2021 06/28/21   Particia Nearing, PA-C  dimenhyDRINATE (DRAMAMINE) 50 MG tablet Take 50 mg every 6 (six) hours as needed by mouth for nausea.    [provider]  HYDROcodone-acetaminophen (NORCO/VICODIN) 5-325 MG tablet Take 1 tablet by mouth every 6 (six) hours as needed. 11/22/21   Benjiman Core, MD  loratadine (CLARITIN) 10 MG tablet Take 10 mg by mouth daily.    [provider]  metFORMIN  (GLUCOPHAGE) 1000 MG tablet Take 0.5 tablets (500 mg total) by mouth 2 (two) times daily with a meal for 20 days. 06/25/21 07/15/21  Caccavale, Sophia, PA-C  Multiple Vitamin (MULTIVITAMIN WITH MINERALS) TABS tablet Take 1 tablet daily by mouth.    [provider]  multivitamin-iron-minerals-folic acid (CENTRUM) chewable tablet Chew 1 tablet by mouth daily. Patient not taking: Reported on 10/19/2017 02/22/17   Vassie Loll, MD  omeprazole (PRILOSEC) 40 MG capsule Take 40 mg by mouth in the morning and at bedtime. 07/12/17   [provider]  ondansetron (ZOFRAN ODT) 4 MG disintegrating tablet Take 1 tablet (4 mg total) by mouth every 8 (eight) hours as needed for nausea or vomiting. Patient not taking: Reported on 11/22/2021 07/13/19   Ward, Chase Picket, PA-C  promethazine (PHENERGAN) 25 MG tablet Take 1 tablet (25 mg total) by mouth every 6 (six) hours as needed for nausea. Patient not taking: Reported on 10/19/2017 05/16/17   Trixie Dredge, PA-C  vitamin B-12 (CYANOCOBALAMIN) 1000 MCG tablet Take 1 tablet (1,000 mcg total) by mouth daily. Patient not taking: Reported on 10/19/2017 02/22/17   Vassie Loll, MD    Family History Family History  Problem Relation Age of Onset   Asthma Mother    Hypertension Mother    Hypertension Father    Diabetes Mellitus II Father     Social History Social History   Tobacco Use   Smoking status: Former    Packs/day: 0.50    Types: Cigarettes    Quit date: 02/20/2013    Years since quitting: 9.3   Smokeless tobacco: Never  Substance Use Topics   Alcohol use: Yes    Comment: occasional   Drug use: No     Allergies   Ace inhibitors, Ace inhibitors, Lamictal [lamotrigine], Nsaids, and Topiramate   Review of Systems Review of Systems  Gastrointestinal:  Positive for abdominal pain and diarrhea.     Physical Exam Triage Vital Signs ED Triage Vitals  Enc Vitals Group     BP 07/13/22 1545 (!) 157/89     Pulse Rate 07/13/22 1545  (!) 103     Resp 07/13/22 1545 16     Temp 07/13/22 1545 97.8 F (36.6 C)     Temp Source 07/13/22 1545 Oral     SpO2 07/13/22 1545 100 %     Weight 07/13/22 1551 199 lb 15.3 oz (90.7 kg)     Height 07/13/22 1551 5\' 1"  (1.549 m)     Head Circumference --      Peak Flow --      Pain Score 07/13/22 1551 7     Pain Loc --      Pain Edu? --      Excl. in GC? --  No data found.  Updated Vital Signs BP (!) 157/89 (BP Location: Left Arm)   Pulse (!) 103   Temp 97.8 F (36.6 C) (Oral)   Resp 16   Ht 5\' 1"  (1.549 m)   Wt 199 lb 15.3 oz (90.7 kg)   LMP 06/12/2022 (Exact Date)   SpO2 100%   BMI 37.78 kg/m   Visual Acuity Right Eye Distance:   Left Eye Distance:   Bilateral Distance:    Right Eye Near:   Left Eye Near:    Bilateral Near:     Physical Exam Constitutional:      General: She is not in acute distress.    Appearance: She is well-developed. She is not toxic-appearing.  Cardiovascular:     Rate and Rhythm: Regular rhythm. Tachycardia present.  Abdominal:     General: Abdomen is flat. Bowel sounds are increased.     Palpations: Abdomen is soft.     Tenderness: There is abdominal tenderness in the right lower quadrant. There is rebound.  Neurological:     Mental Status: She is alert.      UC Treatments / Results  Labs (all labs ordered are listed, but only abnormal results are displayed) Labs Reviewed - No data to display  EKG   Radiology No results found.  Procedures Procedures (including critical care time)  Medications Ordered in UC Medications - No data to display  Initial Impression / Assessment and Plan / UC Course  I have reviewed the triage vital signs and the nursing notes.  Pertinent labs & imaging results that were available during my care of the patient were reviewed by me and considered in my medical decision making (see chart for details).    Pt with tachycardia and multiple episodes of diarrhea for 1 week. Likely dehydrated.  Has rebound pain in RLQ. Pt states last time she had similar pain, she had free air in her bladder and was hospitalized for a week. Recommended pt seek further eval in ED.   Final Clinical Impressions(s) / UC Diagnoses   Final diagnoses:  Right lower quadrant abdominal pain  Diarrhea, unspecified type  Tachycardia     Discharge Instructions      Please go to the ER for further care.    ED Prescriptions   None    PDMP not reviewed this encounter.   08/13/2022, NP 07/13/22 [phone]

## 2022-07-13 NOTE — ED Notes (Signed)
Patient is being discharged from the Urgent Care and sent to the Emergency Department via private vehicle . Per Rica Mast NP, patient is in need of higher level of care due to abdominal pain. Patient is aware and verbalizes understanding of plan of care.  Vitals:   07/13/22 1545  BP: (!) 157/89  Pulse: (!) 103  Resp: 16  Temp: 97.8 F (36.6 C)  SpO2: 100%

## 2022-07-13 NOTE — ED Triage Notes (Signed)
Patient having abdominal pain and diarrhea for a week. Patient has a history of Gastric bypass and gall bladder removal. Patient does have history of diarrhea and GI issues.   In the diarrhea it is completely liquid and the amount of orange mucus in the stool has grown. Episodes of diarrhea daily are in the double digits, 2-3 episodes an hour with taking imodium   Patient was recently on antibiotics. Has been off them for 2 weeks.   No dietary or medication changes, no one around the Patient with the same symptoms.

## 2022-07-13 NOTE — Discharge Instructions (Signed)
Please go to the ER for further care.  °

## 2022-07-13 NOTE — ED Notes (Signed)
Floor RN confirmed that she is ready for patient to come upstairs and floor is ready

## 2022-07-14 ENCOUNTER — Encounter (HOSPITAL_COMMUNITY): Payer: Self-pay | Admitting: Internal Medicine

## 2022-07-14 DIAGNOSIS — N179 Acute kidney failure, unspecified: Secondary | ICD-10-CM | POA: Diagnosis present

## 2022-07-14 DIAGNOSIS — E1122 Type 2 diabetes mellitus with diabetic chronic kidney disease: Secondary | ICD-10-CM | POA: Diagnosis present

## 2022-07-14 DIAGNOSIS — Z8249 Family history of ischemic heart disease and other diseases of the circulatory system: Secondary | ICD-10-CM | POA: Diagnosis not present

## 2022-07-14 DIAGNOSIS — Z833 Family history of diabetes mellitus: Secondary | ICD-10-CM | POA: Diagnosis not present

## 2022-07-14 DIAGNOSIS — E872 Acidosis, unspecified: Secondary | ICD-10-CM | POA: Diagnosis present

## 2022-07-14 DIAGNOSIS — E669 Obesity, unspecified: Secondary | ICD-10-CM | POA: Diagnosis present

## 2022-07-14 DIAGNOSIS — Z888 Allergy status to other drugs, medicaments and biological substances status: Secondary | ICD-10-CM | POA: Diagnosis not present

## 2022-07-14 DIAGNOSIS — D509 Iron deficiency anemia, unspecified: Secondary | ICD-10-CM | POA: Diagnosis present

## 2022-07-14 DIAGNOSIS — D500 Iron deficiency anemia secondary to blood loss (chronic): Secondary | ICD-10-CM | POA: Diagnosis present

## 2022-07-14 DIAGNOSIS — F32A Depression, unspecified: Secondary | ICD-10-CM | POA: Diagnosis present

## 2022-07-14 DIAGNOSIS — K580 Irritable bowel syndrome with diarrhea: Secondary | ICD-10-CM | POA: Diagnosis present

## 2022-07-14 DIAGNOSIS — E860 Dehydration: Secondary | ICD-10-CM | POA: Diagnosis present

## 2022-07-14 DIAGNOSIS — Z9049 Acquired absence of other specified parts of digestive tract: Secondary | ICD-10-CM | POA: Diagnosis not present

## 2022-07-14 DIAGNOSIS — Z87891 Personal history of nicotine dependence: Secondary | ICD-10-CM | POA: Diagnosis not present

## 2022-07-14 DIAGNOSIS — N189 Chronic kidney disease, unspecified: Secondary | ICD-10-CM | POA: Diagnosis present

## 2022-07-14 DIAGNOSIS — Z7984 Long term (current) use of oral hypoglycemic drugs: Secondary | ICD-10-CM | POA: Diagnosis not present

## 2022-07-14 DIAGNOSIS — Z9884 Bariatric surgery status: Secondary | ICD-10-CM | POA: Diagnosis not present

## 2022-07-14 DIAGNOSIS — R748 Abnormal levels of other serum enzymes: Secondary | ICD-10-CM | POA: Diagnosis present

## 2022-07-14 DIAGNOSIS — E1165 Type 2 diabetes mellitus with hyperglycemia: Secondary | ICD-10-CM | POA: Diagnosis present

## 2022-07-14 DIAGNOSIS — E440 Moderate protein-calorie malnutrition: Secondary | ICD-10-CM | POA: Diagnosis present

## 2022-07-14 DIAGNOSIS — Z825 Family history of asthma and other chronic lower respiratory diseases: Secondary | ICD-10-CM | POA: Diagnosis not present

## 2022-07-14 DIAGNOSIS — Z79899 Other long term (current) drug therapy: Secondary | ICD-10-CM | POA: Diagnosis not present

## 2022-07-14 DIAGNOSIS — I129 Hypertensive chronic kidney disease with stage 1 through stage 4 chronic kidney disease, or unspecified chronic kidney disease: Secondary | ICD-10-CM | POA: Diagnosis present

## 2022-07-14 LAB — CBC WITH DIFFERENTIAL/PLATELET
Abs Immature Granulocytes: 0.02 10*3/uL (ref 0.00–0.07)
Basophils Absolute: 0 10*3/uL (ref 0.0–0.1)
Basophils Relative: 1 %
Eosinophils Absolute: 0.2 10*3/uL (ref 0.0–0.5)
Eosinophils Relative: 2 %
HCT: 27.5 % — ABNORMAL LOW (ref 36.0–46.0)
Hemoglobin: 8.8 g/dL — ABNORMAL LOW (ref 12.0–15.0)
Immature Granulocytes: 0 %
Lymphocytes Relative: 39 %
Lymphs Abs: 3.1 10*3/uL (ref 0.7–4.0)
MCH: 26.7 pg (ref 26.0–34.0)
MCHC: 32 g/dL (ref 30.0–36.0)
MCV: 83.3 fL (ref 80.0–100.0)
Monocytes Absolute: 0.6 10*3/uL (ref 0.1–1.0)
Monocytes Relative: 8 %
Neutro Abs: 4 10*3/uL (ref 1.7–7.7)
Neutrophils Relative %: 50 %
Platelets: 278 10*3/uL (ref 150–400)
RBC: 3.3 MIL/uL — ABNORMAL LOW (ref 3.87–5.11)
RDW: 17 % — ABNORMAL HIGH (ref 11.5–15.5)
WBC: 7.9 10*3/uL (ref 4.0–10.5)
nRBC: 0 % (ref 0.0–0.2)

## 2022-07-14 LAB — GLUCOSE, CAPILLARY
Glucose-Capillary: 168 mg/dL — ABNORMAL HIGH (ref 70–99)
Glucose-Capillary: 183 mg/dL — ABNORMAL HIGH (ref 70–99)
Glucose-Capillary: 104 mg/dL — ABNORMAL HIGH (ref 70–99)
Glucose-Capillary: 280 mg/dL — ABNORMAL HIGH (ref 70–99)
Glucose-Capillary: 95 mg/dL (ref 70–99)
Glucose-Capillary: 101 mg/dL — ABNORMAL HIGH (ref 70–99)

## 2022-07-14 LAB — COMPREHENSIVE METABOLIC PANEL
ALT: 35 U/L (ref 0–44)
AST: 79 U/L — ABNORMAL HIGH (ref 15–41)
Albumin: 2.6 g/dL — ABNORMAL LOW (ref 3.5–5.0)
Alkaline Phosphatase: 139 U/L — ABNORMAL HIGH (ref 38–126)
Anion gap: 8 (ref 5–15)
BUN: 34 mg/dL — ABNORMAL HIGH (ref 6–20)
CO2: 19 mmol/L — ABNORMAL LOW (ref 22–32)
Calcium: 7.2 mg/dL — ABNORMAL LOW (ref 8.9–10.3)
Chloride: 112 mmol/L — ABNORMAL HIGH (ref 98–111)
Creatinine, Ser: 1.7 mg/dL — ABNORMAL HIGH (ref 0.44–1.00)
GFR, Estimated: 38 mL/min — ABNORMAL LOW (ref >60)
Glucose, Bld: 148 mg/dL — ABNORMAL HIGH (ref 70–99)
Potassium: 3.6 mmol/L (ref 3.5–5.1)
Sodium: 139 mmol/L (ref 135–145)
Total Bilirubin: 0.4 mg/dL (ref 0.3–1.2)
Total Protein: 6.5 g/dL (ref 6.5–8.1)

## 2022-07-14 LAB — FERRITIN: Ferritin: 8 ng/mL — ABNORMAL LOW (ref 11–307)

## 2022-07-14 LAB — PHOSPHORUS: Phosphorus: 5.1 mg/dL — ABNORMAL HIGH (ref 2.5–4.6)

## 2022-07-14 LAB — IRON AND TIBC
Iron: 29 ug/dL (ref 28–170)
Saturation Ratios: 8 % — ABNORMAL LOW (ref 10.4–31.8)
TIBC: 378 ug/dL (ref 250–450)
UIBC: 349 ug/dL

## 2022-07-14 LAB — MAGNESIUM: Magnesium: 1.9 mg/dL (ref 1.7–2.4)

## 2022-07-14 LAB — HIV ANTIBODY (ROUTINE TESTING W REFLEX): HIV Screen 4th Generation wRfx: NONREACTIVE

## 2022-07-14 MED ORDER — LIVING WELL WITH DIABETES BOOK
Freq: Once | Status: AC
  Filled 2022-07-14: qty 1

## 2022-07-14 MED ORDER — ONDANSETRON HCL 4 MG/2ML IJ SOLN
4.0000 mg | Freq: Four times a day (QID) | INTRAMUSCULAR | Status: DC | PRN
Start: 2022-07-14 — End: 2022-07-16
  Administered 2022-07-14: 4 mg via INTRAVENOUS
  Filled 2022-07-14: qty 2

## 2022-07-14 NOTE — Inpatient Diabetes Management (Signed)
Inpatient Diabetes Program Recommendations  AACE/ADA: New Consensus Statement on Inpatient Glycemic Control (2015)  Target Ranges:  Prepandial:   less than 140 mg/dL      Peak postprandial:   less than 180 mg/dL (1-2 hours)      Critically ill patients:  140 - 180 mg/dL   Lab Results  Component Value Date   GLUCAP 104 (H) 07/14/2022   HGBA1C 11.2 (H) 07/13/2022    Review of Glycemic Control  Diabetes history: DM2 Outpatient Diabetes medications: Metformin 500 mg qd Current orders for Inpatient glycemic control: Novolog 0-9 units, 0-5 units hs  Inpatient Diabetes Program Recommendations:   Spoke with pt about  A1C results 11.2 (average blood glucose 275 over the past 2-3 months) and explained what an A1C is, basic pathophysiology of DM Type 2, basic home care, basic diabetes diet nutrition principles, importance of checking CBGs and maintaining good CBG control to prevent long-term and short-term complications. Reviewed signs and symptoms of hyperglycemia and hypoglycemia and how to treat hypoglycemia at home. Also reviewed blood sugar goals at home.  RNs to provide ongoing basic DM education at bedside with this patient.  Placed consult for dietician to evaluate patient nutritional needs including 5 small meals daily instead of 3.  Thank you, Billy Fischer. Ramonte Mena, RN, MSN, CDE  Diabetes Coordinator Inpatient Glycemic Control Team Team Pager [phone] (8am-5pm) 07/14/2022 1:48 PM

## 2022-07-14 NOTE — Plan of Care (Signed)
  Problem: Coping: Goal: Ability to adjust to condition or change in health will improve Outcome: Progressing   Problem: Fluid Volume: Goal: Ability to maintain a balanced intake and output will improve Outcome: Progressing   Problem: Health Behavior/Discharge Planning: Goal: Ability to identify and utilize available resources and services will improve Outcome: Progressing Goal: Ability to manage health-related needs will improve Outcome: Progressing   Problem: Metabolic: Goal: Ability to maintain appropriate glucose levels will improve Outcome: Progressing   Problem: Nutritional: Goal: Maintenance of adequate nutrition will improve Outcome: Progressing Goal: Progress toward achieving an optimal weight will improve Outcome: Progressing   Problem: Skin Integrity: Goal: Risk for impaired skin integrity will decrease Outcome: Progressing   Problem: Tissue Perfusion: Goal: Adequacy of tissue perfusion will improve Outcome: Progressing   Problem: Education: Goal: Knowledge of General Education information will improve Description: Including pain rating scale, medication(s)/side effects and non-pharmacologic comfort measures Outcome: Progressing   Problem: Health Behavior/Discharge Planning: Goal: Ability to manage health-related needs will improve Outcome: Progressing   Problem: Clinical Measurements: Goal: Will remain free from infection Outcome: Progressing Goal: Diagnostic test results will improve Outcome: Progressing Goal: Respiratory complications will improve Outcome: Progressing Goal: Cardiovascular complication will be avoided Outcome: Progressing   Problem: Activity: Goal: Risk for activity intolerance will decrease Outcome: Progressing   Problem: Coping: Goal: Level of anxiety will decrease Outcome: Progressing   Problem: Elimination: Goal: Will not experience complications related to urinary retention Outcome: Progressing   Problem: Pain  Managment: Goal: General experience of comfort will improve Outcome: Progressing   Problem: Safety: Goal: Ability to remain free from injury will improve Outcome: Progressing   Problem: Skin Integrity: Goal: Risk for impaired skin integrity will decrease Outcome: Progressing
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
## 2022-07-14 NOTE — TOC Initial Note (Signed)
Transition of Care [facility] [place] [facility]) - Initial/Assessment Note    Patient Details  Name: Virginia [name] MRN: [id number] Date of Birth: 1976-05-05  [facility] CM/SW Contact:    Tom-Johnson, Hershal Coria, RN Phone Number: 07/14/2022, 5:46 PM  Clinical Narrative:                  CM spoke with patient at bedside about needs for post hospital transition. Admitted with AKI in the setting of acute diarrhea.  From home with two roommates. Has one son, one sister and both parents are supportive. Currently not employed and not on disability. States her Medical insurance expires end of August and will have to find one. States she is in the process of filing for disability. CM notified patient she could file for Medicaid to see if she is eligible.   Does not have DME's at home. PCP is Harvest Forest, MD and uses [facility] on [address].  No TOC needs or recommendations noted at this time. CM will continue to follow with needs as patient progresses with care.    Expected Discharge Plan: Home/Self Care Barriers to Discharge: Continued Medical Work up   Patient Goals and CMS Choice Patient states their goals for this hospitalization and ongoing recovery are:: To return home CMS Medicare.gov Compare Post Acute Care list provided to:: Patient Choice offered to / list presented to : NA  Expected Discharge Plan and Services Expected Discharge Plan: Home/Self Care   Discharge Planning Services: CM Consult Post Acute Care Choice: NA Living arrangements for the past 2 months: Single Family Home                 DME Arranged: N/A DME Agency: NA       HH Arranged: NA HH Agency: NA        Prior Living Arrangements/Services Living arrangements for the past 2 months: Single Family Home Lives with:: Roommate (Two) Patient language and need for interpreter reviewed:: Yes Do you feel safe going back to the place where you live?: Yes      Need for Family Participation in  Patient Care: Yes (Comment) Care giver support system in place?: Yes (comment)   Criminal Activity/Legal Involvement Pertinent to Current Situation/Hospitalization: No - Comment as needed  Activities of Daily Living Home Assistive Devices/Equipment: None ADL Screening (condition at time of admission) Patient's cognitive ability adequate to safely complete daily activities?: Yes Is the patient deaf or have difficulty hearing?: No Does the patient have difficulty seeing, even when wearing glasses/contacts?: No Does the patient have difficulty concentrating, remembering, or making decisions?: No Patient able to express need for assistance with ADLs?: Yes Does the patient have difficulty dressing or bathing?: No Independently performs ADLs?: Yes (appropriate for developmental age) Does the patient have difficulty walking or climbing stairs?: No Weakness of Legs: None Weakness of Arms/Hands: None  Permission Sought/Granted Permission sought to share information with : Case Manager, Family Supports Permission granted to share information with : Yes, Verbal Permission Granted              Emotional Assessment Appearance:: Appears stated age Attitude/Demeanor/Rapport: Engaged, Gracious Affect (typically observed): Accepting, Appropriate, Calm, Hopeful, Pleasant Orientation: : Oriented to Self, Oriented to Place, Oriented to  Time, Oriented to Situation Alcohol / Substance Use: Not Applicable Psych Involvement: No (comment)  Admission diagnosis:  AKI (acute kidney injury) (HCC) [N17.9] Diarrhea, unspecified type [R19.7] Patient Active Problem List   Diagnosis Date Noted   AKI (acute kidney  injury) (HCC) 07/13/2022   Urinary tract infection without hematuria    Dehydration    Gastroesophageal reflux disease    Class 2 obesity due to excess calories with body mass index (BMI) of 39.0 to 39.9 in adult    Hypokalemia    Hypomagnesemia    Anxiety 02/20/2017   Diabetes mellitus  without complication (HCC) 02/20/2017   Sepsis (HCC) 02/20/2017   Abdominal pain 02/20/2017   Nausea & vomiting 02/20/2017   Tobacco abuse 02/20/2017   Flank pain 02/20/2017   Hypertension    Lactic acidosis    PCP:  Harvest Forest, MD Pharmacy:   [facility] [phone] G[place] Otto, [place] - [address] AT [facility] OF [facility] [address] [place] [address] Phone: [phone] Fax: [phone]  [facility] #[id number] G[place] Otto,  - [address] AT [facility] OF [address] [place] [address] Phone: [phone] Fax: [phone]     Social Determinants of Health (SDOH) Interventions    Readmission Risk Interventions     No data to display

## 2022-07-14 NOTE — Inpatient Diabetes Management (Signed)
Inpatient Diabetes Program Recommendations  AACE/ADA: New Consensus Statement on Inpatient Glycemic Control (2015)  Target Ranges:  Prepandial:   less than 140 mg/dL      Peak postprandial:   less than 180 mg/dL (1-2 hours)      Critically ill patients:  140 - 180 mg/dL   Lab Results  Component Value Date   GLUCAP 183 (H) 07/14/2022   HGBA1C 11.2 (H) 07/13/2022    Review of Glycemic Control  Latest Reference Range & Units 07/13/22 22:03 07/14/22 05:43 07/14/22 07:31  Glucose-Capillary 70 - 99 mg/dL 062 (H) 376 (H) 283 (H)  (H): Data is abnormally high  Diabetes history: DM2 Outpatient Diabetes medications: Metformin 500 mg qd Current orders for Inpatient glycemic control: Novolog 0-9 units, 0-5 units hs  A1c 11.2 (average glucose 275 over the past 2-3 months)  Inpatient Diabetes Program Recommendations:   Plan to see patient today to discuss elevated A1c and review risks of elevated CBGs.  Thank you, Billy Fischer. Aryahna Spagna, RN, MSN, CDE  Diabetes Coordinator Inpatient Glycemic Control Team Team Pager [phone] (8am-5pm) 07/14/2022 10:39 AM

## 2022-07-15 DIAGNOSIS — N179 Acute kidney failure, unspecified: Secondary | ICD-10-CM | POA: Diagnosis not present

## 2022-07-15 LAB — GLUCOSE, CAPILLARY
Glucose-Capillary: 107 mg/dL — ABNORMAL HIGH (ref 70–99)
Glucose-Capillary: 157 mg/dL — ABNORMAL HIGH (ref 70–99)
Glucose-Capillary: 131 mg/dL — ABNORMAL HIGH (ref 70–99)
Glucose-Capillary: 140 mg/dL — ABNORMAL HIGH (ref 70–99)

## 2022-07-15 LAB — CBC
HCT: 25.5 % — ABNORMAL LOW (ref 36.0–46.0)
Hemoglobin: 8 g/dL — ABNORMAL LOW (ref 12.0–15.0)
MCH: 26.7 pg (ref 26.0–34.0)
MCHC: 31.4 g/dL (ref 30.0–36.0)
MCV: 85 fL (ref 80.0–100.0)
Platelets: 225 10*3/uL (ref 150–400)
RBC: 3 MIL/uL — ABNORMAL LOW (ref 3.87–5.11)
RDW: 17.3 % — ABNORMAL HIGH (ref 11.5–15.5)
WBC: 8.3 10*3/uL (ref 4.0–10.5)
nRBC: 0 % (ref 0.0–0.2)

## 2022-07-15 LAB — COMPREHENSIVE METABOLIC PANEL
ALT: 22 U/L (ref 0–44)
AST: 21 U/L (ref 15–41)
Albumin: 2.3 g/dL — ABNORMAL LOW (ref 3.5–5.0)
Alkaline Phosphatase: 123 U/L (ref 38–126)
Anion gap: 5 (ref 5–15)
BUN: 29 mg/dL — ABNORMAL HIGH (ref 6–20)
CO2: 20 mmol/L — ABNORMAL LOW (ref 22–32)
Calcium: 6.7 mg/dL — ABNORMAL LOW (ref 8.9–10.3)
Chloride: 114 mmol/L — ABNORMAL HIGH (ref 98–111)
Creatinine, Ser: 1.36 mg/dL — ABNORMAL HIGH (ref 0.44–1.00)
GFR, Estimated: 49 mL/min — ABNORMAL LOW (ref >60)
Glucose, Bld: 107 mg/dL — ABNORMAL HIGH (ref 70–99)
Potassium: 4 mmol/L (ref 3.5–5.1)
Sodium: 139 mmol/L (ref 135–145)
Total Bilirubin: 0.5 mg/dL (ref 0.3–1.2)
Total Protein: 5.7 g/dL — ABNORMAL LOW (ref 6.5–8.1)

## 2022-07-15 LAB — PHOSPHORUS: Phosphorus: 3.2 mg/dL (ref 2.5–4.6)

## 2022-07-15 LAB — MAGNESIUM: Magnesium: 1.5 mg/dL — ABNORMAL LOW (ref 1.7–2.4)

## 2022-07-15 LAB — VITAMIN D 25 HYDROXY (VIT D DEFICIENCY, FRACTURES): Vit D, 25-Hydroxy: 10.15 ng/mL — ABNORMAL LOW (ref 30–100)

## 2022-07-15 LAB — FOLATE: Folate: 10.5 ng/mL (ref >5.9)

## 2022-07-15 LAB — C-REACTIVE PROTEIN: CRP: 0.8 mg/dL (ref <1.0)

## 2022-07-15 LAB — VITAMIN B12: Vitamin B-12: 298 pg/mL (ref 180–914)

## 2022-07-15 MED ORDER — CALCIUM GLUCONATE-NACL 2-0.675 GM/100ML-% IV SOLN
2.0000 g | Freq: Once | INTRAVENOUS | Status: AC
  Administered 2022-07-15: 2000 mg via INTRAVENOUS
  Filled 2022-07-15: qty 100

## 2022-07-15 MED ORDER — CALCIUM CARBONATE ANTACID 500 MG PO CHEW
1.0000 | CHEWABLE_TABLET | Freq: Three times a day (TID) | ORAL | Status: DC
  Administered 2022-07-15 – 2022-07-16 (×3): 200 mg via ORAL
  Filled 2022-07-15 (×3): qty 1

## 2022-07-15 MED ORDER — MAGNESIUM SULFATE 2 GM/50ML IV SOLN
2.0000 g | Freq: Once | INTRAVENOUS | Status: AC
Start: 2022-07-15 — End: 2022-07-15
  Administered 2022-07-15: 2 g via INTRAVENOUS
  Filled 2022-07-15: qty 50

## 2022-07-15 MED ORDER — VITAMIN D (ERGOCALCIFEROL) 1.25 MG (50000 UNIT) PO CAPS
50000.0000 [IU] | ORAL_CAPSULE | ORAL | Status: DC
  Administered 2022-07-15: 50000 [IU] via ORAL
  Filled 2022-07-15: qty 1

## 2022-07-15 MED ORDER — DM-GUAIFENESIN ER 30-600 MG PO TB12
1.0000 | ORAL_TABLET | Freq: Two times a day (BID) | ORAL | Status: DC
Start: 2022-07-15 — End: 2022-07-16
  Administered 2022-07-15 – 2022-07-16 (×3): 1 via ORAL
  Filled 2022-07-15 (×3): qty 1

## 2022-07-15 MED ORDER — SODIUM CHLORIDE 0.9 % IV SOLN
250.0000 mg | Freq: Every day | INTRAVENOUS | Status: AC
  Administered 2022-07-15 – 2022-07-16 (×2): 250 mg via INTRAVENOUS
  Filled 2022-07-15 (×2): qty 20

## 2022-07-15 MED ORDER — ADULT MULTIVITAMIN W/MINERALS CH
1.0000 | ORAL_TABLET | Freq: Two times a day (BID) | ORAL | Status: DC
  Administered 2022-07-15 – 2022-07-16 (×2): 1 via ORAL
  Filled 2022-07-15 (×2): qty 1

## 2022-07-15 MED ORDER — OXYCODONE HCL 5 MG PO TABS
5.0000 mg | ORAL_TABLET | ORAL | Status: DC | PRN
  Administered 2022-07-15 – 2022-07-16 (×3): 5 mg via ORAL
  Filled 2022-07-15 (×3): qty 1
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
## 2022-07-15 NOTE — Plan of Care (Signed)
  Problem: Pain Managment: Goal: General experience of comfort will improve Outcome: Progressing   

## 2022-07-15 NOTE — Progress Notes (Deleted)
DISCHARGE NOTE HOME Virginia [name] to be discharged Home per MD order. Discussed prescriptions and follow up appointments with the patient. Prescriptions given to patient; medication list explained in detail. Patient verbalized understanding.  Skin clean, dry and intact without evidence of skin break down, no evidence of skin tears noted. IV catheter discontinued intact. Site without signs and symptoms of complications. Dressing and pressure applied. Pt denies pain at the site currently. No complaints noted.  Patient free of lines, drains, and wounds.   An After Visit Summary (AVS) was printed and given to the patient. Patient escorted via wheelchair, and discharged home via private auto.  Amonie Wisser S Artez Regis, RN
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
## 2022-07-16 DIAGNOSIS — N179 Acute kidney failure, unspecified: Secondary | ICD-10-CM | POA: Diagnosis not present

## 2022-07-16 LAB — MAGNESIUM: Magnesium: 1.7 mg/dL (ref 1.7–2.4)

## 2022-07-16 LAB — CBC
HCT: 27.5 % — ABNORMAL LOW (ref 36.0–46.0)
Hemoglobin: 8.6 g/dL — ABNORMAL LOW (ref 12.0–15.0)
MCH: 26.8 pg (ref 26.0–34.0)
MCHC: 31.3 g/dL (ref 30.0–36.0)
MCV: 85.7 fL (ref 80.0–100.0)
Platelets: 211 10*3/uL (ref 150–400)
RBC: 3.21 MIL/uL — ABNORMAL LOW (ref 3.87–5.11)
RDW: 17.2 % — ABNORMAL HIGH (ref 11.5–15.5)
WBC: 6.6 10*3/uL (ref 4.0–10.5)
nRBC: 0 % (ref 0.0–0.2)

## 2022-07-16 LAB — COMPREHENSIVE METABOLIC PANEL
ALT: 20 U/L (ref 0–44)
AST: 19 U/L (ref 15–41)
Albumin: 2.4 g/dL — ABNORMAL LOW (ref 3.5–5.0)
Alkaline Phosphatase: 132 U/L — ABNORMAL HIGH (ref 38–126)
Anion gap: 3 — ABNORMAL LOW (ref 5–15)
BUN: 21 mg/dL — ABNORMAL HIGH (ref 6–20)
CO2: 22 mmol/L (ref 22–32)
Calcium: 7.4 mg/dL — ABNORMAL LOW (ref 8.9–10.3)
Chloride: 113 mmol/L — ABNORMAL HIGH (ref 98–111)
Creatinine, Ser: 1.19 mg/dL — ABNORMAL HIGH (ref 0.44–1.00)
GFR, Estimated: 58 mL/min — ABNORMAL LOW (ref >60)
Glucose, Bld: 261 mg/dL — ABNORMAL HIGH (ref 70–99)
Potassium: 4.2 mmol/L (ref 3.5–5.1)
Sodium: 138 mmol/L (ref 135–145)
Total Bilirubin: 0.4 mg/dL (ref 0.3–1.2)
Total Protein: 6.1 g/dL — ABNORMAL LOW (ref 6.5–8.1)

## 2022-07-16 LAB — GLUCOSE, CAPILLARY
Glucose-Capillary: 124 mg/dL — ABNORMAL HIGH (ref 70–99)
Glucose-Capillary: 139 mg/dL — ABNORMAL HIGH (ref 70–99)

## 2022-07-16 MED ORDER — DM-GUAIFENESIN ER 30-600 MG PO TB12: 1.0000 | ORAL_TABLET | Freq: Two times a day (BID) | ORAL | 0 refills | Status: AC

## 2022-07-16 MED ORDER — CALCIUM CARBONATE ANTACID 500 MG PO CHEW: 1.0000 | CHEWABLE_TABLET | Freq: Three times a day (TID) | ORAL | 0 refills | Status: AC

## 2022-07-16 MED ORDER — DOCUSATE SODIUM 100 MG PO CAPS: 100.0000 mg | ORAL_CAPSULE | Freq: Every day | ORAL | 2 refills | Status: DC

## 2022-07-16 MED ORDER — OXYCODONE HCL 5 MG PO TABS: 5.0000 mg | ORAL_TABLET | ORAL | 0 refills | Status: DC | PRN

## 2022-07-16 MED ORDER — VITAMIN D (ERGOCALCIFEROL) 1.25 MG (50000 UNIT) PO CAPS: 50000.0000 [IU] | ORAL_CAPSULE | ORAL | Status: DC

## 2022-07-16 MED ORDER — VITAMIN D (ERGOCALCIFEROL) 1.25 MG (50000 UNIT) PO CAPS: 50000.0000 [IU] | ORAL_CAPSULE | ORAL | 0 refills | Status: DC

## 2022-07-16 MED ORDER — FERROUS SULFATE 325 (65 FE) MG PO TABS: 325.0000 mg | ORAL_TABLET | Freq: Every day | ORAL | 3 refills | Status: DC
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
## 2022-07-16 NOTE — TOC Transition Note (Signed)
Transition of Care [facility]) - CM/SW Discharge Note   Patient Details  Name: Virginia [name] MRN: [id number] Date of Birth: 12-20-1975  [facility]) CM/SW Contact:  Tom-Johnson, Hershal Coria, RN Phone Number: 07/16/2022, 12:13 PM   Clinical Narrative:     Patient is scheduled for discharge today. No TOC needs noted, denies any needs. Mother to transport at discharge. No further TOC needs noted.  Final next level of care: Home/Self Care Barriers to Discharge: Barriers Resolved   Patient Goals and CMS Choice Patient states their goals for this hospitalization and ongoing recovery are:: To return home CMS Medicare.gov Compare Post Acute Care list provided to:: Patient Choice offered to / list presented to : NA  Discharge Placement                Patient to be transferred to facility by: Mother      Discharge Plan and Services   Discharge Planning Services: CM Consult Post Acute Care Choice: NA          DME Arranged: N/A DME Agency: NA       HH Arranged: NA HH Agency: NA        Social Determinants of Health (SDOH) Interventions     Readmission Risk Interventions     No data to display

## 2022-07-16 NOTE — Progress Notes (Signed)
DISCHARGE NOTE HOME Virginia [name] to be discharged Home per MD order. Discussed prescriptions and follow up appointments with the patient. Prescriptions given to patient; medication list explained in detail. Patient verbalized understanding.  Skin clean, dry and intact without evidence of skin break down, no evidence of skin tears noted. IV catheter discontinued intact. Site without signs and symptoms of complications. Dressing and pressure applied. Pt denies pain at the site currently. No complaints noted.  Patient free of lines, drains, and wounds.   An After Visit Summary (AVS) was printed and given to the patient. Patient escorted via wheelchair, and discharged home via private auto.  Amisha Pospisil S Dyron Kawano, RN   

## 2022-07-16 NOTE — Progress Notes (Signed)
Discharge instructions (including medications) discussed with and copy provided to patient/caregiver  Printed oxycodone prescription that in the chart signed by Dr. Sherryll Burger given to patient.

## 2022-07-17 LAB — VITAMIN C: Vitamin C: 0.5 mg/dL (ref 0.4–2.0)

## 2022-07-18 LAB — PARATHYROID HORMONE, INTACT (NO CA): PTH: 167 pg/mL — ABNORMAL HIGH (ref 15–65)

## 2022-07-18 LAB — VITAMIN B1: Vitamin B1 (Thiamine): 106.4 nmol/L (ref 66.5–200.0)

## 2022-07-19 LAB — ZINC: Zinc: 78 ug/dL (ref 44–115)

## 2022-07-19 LAB — COPPER, SERUM: Copper: 138 ug/dL (ref 80–158)

## 2022-07-20 LAB — VITAMIN A: Vitamin A (Retinoic Acid): 44.8 ug/dL (ref 20.1–62.0)

## 2022-12-27 ENCOUNTER — Telehealth: Payer: Self-pay

## 2022-12-27 NOTE — Telephone Encounter (Signed)
Mychart msg sent. AS, CMA 

## 2023-01-24 ENCOUNTER — Emergency Department (HOSPITAL_COMMUNITY)
Admission: EM | Admit: 2023-01-24 | Discharge: 2023-01-24 | Disposition: A | Discharge status: Home / Self Care | Payer: Commercial Managed Care - HMO | Attending: Emergency Medicine | Admitting: Emergency Medicine

## 2023-01-24 ENCOUNTER — Emergency Department (HOSPITAL_COMMUNITY): Payer: Commercial Managed Care - HMO

## 2023-01-24 ENCOUNTER — Encounter (HOSPITAL_COMMUNITY): Payer: Self-pay | Admitting: Emergency Medicine

## 2023-01-24 DIAGNOSIS — J029 Acute pharyngitis, unspecified: Secondary | ICD-10-CM | POA: Diagnosis not present

## 2023-01-24 DIAGNOSIS — Z20822 Contact with and (suspected) exposure to covid-19: Secondary | ICD-10-CM | POA: Diagnosis not present

## 2023-01-24 DIAGNOSIS — J069 Acute upper respiratory infection, unspecified: Secondary | ICD-10-CM | POA: Diagnosis not present

## 2023-01-24 DIAGNOSIS — R059 Cough, unspecified: Secondary | ICD-10-CM | POA: Diagnosis not present

## 2023-01-24 DIAGNOSIS — R0602 Shortness of breath: Secondary | ICD-10-CM | POA: Diagnosis not present

## 2023-01-24 LAB — RESP PANEL BY RT-PCR (RSV, FLU A&B, COVID)  RVPGX2
Influenza A by PCR: NEGATIVE
Influenza B by PCR: NEGATIVE
Resp Syncytial Virus by PCR: NEGATIVE
SARS Coronavirus 2 by RT PCR: NEGATIVE

## 2023-01-24 MED ORDER — BENZONATATE 100 MG PO CAPS: 100.0000 mg | ORAL_CAPSULE | Freq: Two times a day (BID) | ORAL | 0 refills | Status: DC | PRN

## 2023-01-24 MED ORDER — BENZONATATE 100 MG PO CAPS
100.0000 mg | ORAL_CAPSULE | Freq: Once | ORAL | Status: AC
  Administered 2023-01-24: 100 mg via ORAL
  Filled 2023-01-24: qty 1

## 2023-01-24 MED ORDER — ALBUTEROL SULFATE HFA 108 (90 BASE) MCG/ACT IN AERS: 2.0000 | INHALATION_SPRAY | RESPIRATORY_TRACT | Status: DC | PRN

## 2023-01-24 MED ORDER — AZITHROMYCIN 250 MG PO TABS: 500.0000 mg | ORAL_TABLET | Freq: Every day | ORAL | Status: DC

## 2023-01-24 MED ORDER — IPRATROPIUM-ALBUTEROL 0.5-2.5 (3) MG/3ML IN SOLN
3.0000 mL | Freq: Once | RESPIRATORY_TRACT | Status: AC
  Administered 2023-01-24: 3 mL via RESPIRATORY_TRACT
  Filled 2023-01-24: qty 3

## 2023-01-24 MED ORDER — AZITHROMYCIN 250 MG PO TABS: 250.0000 mg | ORAL_TABLET | Freq: Every day | ORAL | 0 refills | Status: DC

## 2023-01-24 NOTE — ED Triage Notes (Signed)
Pt reports cough, sore throat, SOB and rib/back pain (she believes from coughing.)  Pt has been w/o her rescue inhaler for "quite a while"

## 2023-01-24 NOTE — ED Provider Notes (Signed)
[facility] Emergency Department Provider Note MRN:  [id number]  Arrival date & time: 01/24/23     Chief Complaint   Cough, Sore Throat, and Shortness of Breath   History of Present Illness   Virginia [name] is a 47 y.o. year-old female presents to the ED with chief complaint of cough x 4 weeks.  Reports that she has been worsening over the past 3 days.  Denies fever.  Reports associated sore throat.  Reports rib pain and back pain exacerbated by coughing.  History provided by patient.   Review of Systems  Pertinent positive and negative review of systems noted in HPI.    Physical Exam   Vitals:   01/24/23 0442 01/24/23 0600  BP: (!) 192/100 (!) 183/96  Pulse: 98 97  Resp: (!) 22   Temp: 98.2 F (36.8 C)   SpO2: 99% 100%    CONSTITUTIONAL:  well-appearing, NAD NEURO:  Alert and oriented x 3, CN 3-12 grossly intact EYES:  eyes equal and reactive ENT/NECK:  Supple, no stridor  CARDIO:  normal rate, regular rhythm, appears well-perfused  PULM:  No respiratory distress, mild wheezing GI/GU:  non-distended,  MSK/SPINE:  No gross deformities, no edema, moves all extremities  SKIN:  no rash, atraumatic   *Additional and/or pertinent findings included in MDM below  Diagnostic and Interventional Summary    EKG Interpretation  Date/Time:    Ventricular Rate:    PR Interval:    QRS Duration:   QT Interval:    QTC Calculation:   R Axis:     Text Interpretation:         Labs Reviewed  RESP PANEL BY RT-PCR (RSV, FLU A&B, COVID)  [id number]    DG Chest Port 1 View  Final Result      Medications  albuterol (VENTOLIN HFA) 108 (90 Base) MCG/ACT inhaler 2 puff (has no administration in time range)  azithromycin (ZITHROMAX) tablet 500 mg (has no administration in time range)  benzonatate (TESSALON) capsule 100 mg (has no administration in time range)  ipratropium-albuterol (DUONEB) 0.5-2.5 (3) MG/3ML nebulizer solution 3 mL (3 mLs  Nebulization Given 01/24/23 0534)     Procedures  /  Critical Care Procedures  ED Course and Medical Decision Making  I have reviewed the triage vital signs, the nursing notes, and pertinent available records from the EMR.  Social Determinants Affecting Complexity of Care: Patient has no clinically significant social determinants affecting this chief complaint..   ED Course:    Medical Decision Making Cough x 4 weeks.  Worsening over the past few days.  COVID and flu negative.  CXR negative for pneumonia.  Will trial z-pak given length of symptoms.  Amount and/or Complexity of Data Reviewed Labs: ordered.    Details: Covid, flu, RSV negative Radiology: ordered and independent interpretation performed.    Details: No infiltrate seen  Risk Prescription drug management.     Consultants: No consultations were needed in caring for this patient.   Treatment and Plan: Emergency department workup does not suggest an emergent condition requiring admission or immediate intervention beyond  what has been performed at this time. The patient is safe for discharge and has  been instructed to return immediately for worsening symptoms, change in  symptoms or any other concerns    Final Clinical Impressions(s) / ED Diagnoses     ICD-10-CM   1. Upper respiratory tract infection, unspecified type  J06.9       ED Discharge Orders  Ordered    azithromycin (ZITHROMAX) 250 MG tablet  Daily        01/24/23 0615    benzonatate (TESSALON) 100 MG capsule  2 times daily PRN        01/24/23 0615              Discharge Instructions Discussed with and Provided to Patient:   Discharge Instructions   None      Montine Circle, PA-C 01/24/23 [id number]    Orpah Greek, MD 01/25/23 [phone]

## 2023-01-24 NOTE — ED Notes (Signed)
C/o cold like sx. For 4 weeks

## 2023-03-01 ENCOUNTER — Emergency Department (HOSPITAL_COMMUNITY): Payer: Commercial Managed Care - HMO

## 2023-03-01 ENCOUNTER — Inpatient Hospital Stay (HOSPITAL_COMMUNITY)
Admission: EM | Admit: 2023-03-01 | Discharge: 2023-03-05 | DRG: 202 | Disposition: A | Discharge status: Home / Self Care | Payer: Commercial Managed Care - HMO | Attending: Internal Medicine | Admitting: Internal Medicine

## 2023-03-01 ENCOUNTER — Observation Stay (HOSPITAL_COMMUNITY): Payer: Commercial Managed Care - HMO

## 2023-03-01 ENCOUNTER — Encounter (HOSPITAL_COMMUNITY): Payer: Self-pay | Admitting: Family Medicine

## 2023-03-01 DIAGNOSIS — D649 Anemia, unspecified: Secondary | ICD-10-CM | POA: Diagnosis not present

## 2023-03-01 DIAGNOSIS — E43 Unspecified severe protein-calorie malnutrition: Secondary | ICD-10-CM | POA: Diagnosis present

## 2023-03-01 DIAGNOSIS — K219 Gastro-esophageal reflux disease without esophagitis: Secondary | ICD-10-CM | POA: Diagnosis present

## 2023-03-01 DIAGNOSIS — J302 Other seasonal allergic rhinitis: Secondary | ICD-10-CM

## 2023-03-01 DIAGNOSIS — F17210 Nicotine dependence, cigarettes, uncomplicated: Secondary | ICD-10-CM | POA: Diagnosis present

## 2023-03-01 DIAGNOSIS — I452 Bifascicular block: Secondary | ICD-10-CM | POA: Diagnosis present

## 2023-03-01 DIAGNOSIS — G8929 Other chronic pain: Secondary | ICD-10-CM | POA: Diagnosis present

## 2023-03-01 DIAGNOSIS — Z888 Allergy status to other drugs, medicaments and biological substances status: Secondary | ICD-10-CM

## 2023-03-01 DIAGNOSIS — D509 Iron deficiency anemia, unspecified: Secondary | ICD-10-CM | POA: Diagnosis present

## 2023-03-01 DIAGNOSIS — R052 Subacute cough: Secondary | ICD-10-CM

## 2023-03-01 DIAGNOSIS — F419 Anxiety disorder, unspecified: Secondary | ICD-10-CM | POA: Diagnosis not present

## 2023-03-01 DIAGNOSIS — E119 Type 2 diabetes mellitus without complications: Secondary | ICD-10-CM

## 2023-03-01 DIAGNOSIS — E1165 Type 2 diabetes mellitus with hyperglycemia: Secondary | ICD-10-CM | POA: Diagnosis present

## 2023-03-01 DIAGNOSIS — R1013 Epigastric pain: Secondary | ICD-10-CM | POA: Diagnosis not present

## 2023-03-01 DIAGNOSIS — R109 Unspecified abdominal pain: Secondary | ICD-10-CM | POA: Diagnosis present

## 2023-03-01 DIAGNOSIS — J4521 Mild intermittent asthma with (acute) exacerbation: Principal | ICD-10-CM | POA: Diagnosis present

## 2023-03-01 DIAGNOSIS — M1990 Unspecified osteoarthritis, unspecified site: Secondary | ICD-10-CM | POA: Diagnosis present

## 2023-03-01 DIAGNOSIS — R079 Chest pain, unspecified: Secondary | ICD-10-CM | POA: Diagnosis not present

## 2023-03-01 DIAGNOSIS — I10 Essential (primary) hypertension: Secondary | ICD-10-CM | POA: Diagnosis present

## 2023-03-01 DIAGNOSIS — Z8249 Family history of ischemic heart disease and other diseases of the circulatory system: Secondary | ICD-10-CM

## 2023-03-01 DIAGNOSIS — Z825 Family history of asthma and other chronic lower respiratory diseases: Secondary | ICD-10-CM

## 2023-03-01 DIAGNOSIS — Z886 Allergy status to analgesic agent status: Secondary | ICD-10-CM

## 2023-03-01 DIAGNOSIS — D72829 Elevated white blood cell count, unspecified: Secondary | ICD-10-CM | POA: Diagnosis present

## 2023-03-01 DIAGNOSIS — Z79899 Other long term (current) drug therapy: Secondary | ICD-10-CM

## 2023-03-01 DIAGNOSIS — R0602 Shortness of breath: Secondary | ICD-10-CM | POA: Diagnosis present

## 2023-03-01 DIAGNOSIS — Z6833 Body mass index (BMI) 33.0-33.9, adult: Secondary | ICD-10-CM

## 2023-03-01 DIAGNOSIS — E1122 Type 2 diabetes mellitus with diabetic chronic kidney disease: Secondary | ICD-10-CM | POA: Diagnosis present

## 2023-03-01 DIAGNOSIS — E875 Hyperkalemia: Secondary | ICD-10-CM | POA: Diagnosis not present

## 2023-03-01 DIAGNOSIS — G4700 Insomnia, unspecified: Secondary | ICD-10-CM | POA: Diagnosis not present

## 2023-03-01 DIAGNOSIS — Z833 Family history of diabetes mellitus: Secondary | ICD-10-CM

## 2023-03-01 DIAGNOSIS — E669 Obesity, unspecified: Secondary | ICD-10-CM | POA: Diagnosis present

## 2023-03-01 DIAGNOSIS — L659 Nonscarring hair loss, unspecified: Secondary | ICD-10-CM | POA: Diagnosis present

## 2023-03-01 DIAGNOSIS — I129 Hypertensive chronic kidney disease with stage 1 through stage 4 chronic kidney disease, or unspecified chronic kidney disease: Secondary | ICD-10-CM | POA: Diagnosis present

## 2023-03-01 DIAGNOSIS — R059 Cough, unspecified: Secondary | ICD-10-CM | POA: Diagnosis not present

## 2023-03-01 DIAGNOSIS — J309 Allergic rhinitis, unspecified: Secondary | ICD-10-CM

## 2023-03-01 DIAGNOSIS — Z9884 Bariatric surgery status: Secondary | ICD-10-CM

## 2023-03-01 DIAGNOSIS — N179 Acute kidney failure, unspecified: Secondary | ICD-10-CM | POA: Diagnosis not present

## 2023-03-01 DIAGNOSIS — Z91018 Allergy to other foods: Secondary | ICD-10-CM

## 2023-03-01 DIAGNOSIS — Z7984 Long term (current) use of oral hypoglycemic drugs: Secondary | ICD-10-CM

## 2023-03-01 LAB — COMPREHENSIVE METABOLIC PANEL
ALT: 6 U/L (ref 0–44)
AST: 21 U/L (ref 15–41)
Albumin: <1.5 g/dL — ABNORMAL LOW (ref 3.5–5.0)
Alkaline Phosphatase: 134 U/L — ABNORMAL HIGH (ref 38–126)
Anion gap: 9 (ref 5–15)
BUN: 25 mg/dL — ABNORMAL HIGH (ref 6–20)
CO2: 17 mmol/L — ABNORMAL LOW (ref 22–32)
Calcium: 7 mg/dL — ABNORMAL LOW (ref 8.9–10.3)
Chloride: 106 mmol/L (ref 98–111)
Creatinine, Ser: 0.74 mg/dL (ref 0.44–1.00)
GFR, Estimated: >60 mL/min (ref >60)
Glucose, Bld: 111 mg/dL — ABNORMAL HIGH (ref 70–99)
Potassium: 4.5 mmol/L (ref 3.5–5.1)
Sodium: 132 mmol/L — ABNORMAL LOW (ref 135–145)
Total Bilirubin: 1.3 mg/dL — ABNORMAL HIGH (ref 0.3–1.2)
Total Protein: 6.2 g/dL — ABNORMAL LOW (ref 6.5–8.1)

## 2023-03-01 LAB — CBC WITH DIFFERENTIAL/PLATELET
Abs Immature Granulocytes: 2.3 10*3/uL — ABNORMAL HIGH (ref 0.00–0.07)
Basophils Absolute: 0.2 10*3/uL — ABNORMAL HIGH (ref 0.0–0.1)
Basophils Relative: 0 %
Eosinophils Absolute: 0 10*3/uL (ref 0.0–0.5)
Eosinophils Relative: 0 %
HCT: 22.5 % — ABNORMAL LOW (ref 36.0–46.0)
Hemoglobin: 7.2 g/dL — ABNORMAL LOW (ref 12.0–15.0)
Immature Granulocytes: 5 %
Lymphocytes Relative: 2 %
Lymphs Abs: 0.9 10*3/uL (ref 0.7–4.0)
MCH: 32 pg (ref 26.0–34.0)
MCHC: 32 g/dL (ref 30.0–36.0)
MCV: 100 fL (ref 80.0–100.0)
Monocytes Absolute: 0.9 10*3/uL (ref 0.1–1.0)
Monocytes Relative: 2 %
Neutro Abs: 41.9 10*3/uL — ABNORMAL HIGH (ref 1.7–7.7)
Neutrophils Relative %: 91 %
Platelets: 213 10*3/uL (ref 150–400)
RBC: 2.25 MIL/uL — ABNORMAL LOW (ref 3.87–5.11)
RDW: 17 % — ABNORMAL HIGH (ref 11.5–15.5)
WBC: 46.1 10*3/uL — ABNORMAL HIGH (ref 4.0–10.5)
nRBC: 0 % (ref 0.0–0.2)

## 2023-03-01 LAB — BRAIN NATRIURETIC PEPTIDE: B Natriuretic Peptide: 291.6 pg/mL — ABNORMAL HIGH (ref 0.0–100.0)

## 2023-03-01 LAB — CBG MONITORING, ED
Glucose-Capillary: 229 mg/dL — ABNORMAL HIGH (ref 70–99)
Glucose-Capillary: 220 mg/dL — ABNORMAL HIGH (ref 70–99)

## 2023-03-01 LAB — I-STAT BETA HCG BLOOD, ED (MC, WL, AP ONLY): I-stat hCG, quantitative: <5 m[IU]/mL (ref <5)

## 2023-03-01 MED ORDER — IOHEXOL 350 MG/ML SOLN
75.0000 mL | Freq: Once | INTRAVENOUS | Status: AC | PRN
  Administered 2023-03-01: 75 mL via INTRAVENOUS

## 2023-03-01 MED ORDER — HYDROMORPHONE HCL 1 MG/ML IJ SOLN
0.5000 mg | INTRAMUSCULAR | Status: DC | PRN
  Administered 2023-03-01: 0.5 mg via INTRAVENOUS
  Filled 2023-03-01: qty 1

## 2023-03-01 MED ORDER — MORPHINE SULFATE (PF) 4 MG/ML IV SOLN
4.0000 mg | Freq: Once | INTRAVENOUS | Status: AC
  Administered 2023-03-01: 4 mg via INTRAVENOUS
  Filled 2023-03-01: qty 1

## 2023-03-01 MED ORDER — ALPRAZOLAM 0.5 MG PO TABS
1.0000 mg | ORAL_TABLET | Freq: Three times a day (TID) | ORAL | Status: DC
  Administered 2023-03-02 (×2): 1 mg via ORAL
  Filled 2023-03-01 (×2): qty 2

## 2023-03-01 MED ORDER — OXYCODONE HCL 5 MG PO TABS
5.0000 mg | ORAL_TABLET | ORAL | Status: DC | PRN
  Administered 2023-03-02 – 2023-03-05 (×10): 5 mg via ORAL
  Filled 2023-03-01 (×10): qty 1

## 2023-03-01 MED ORDER — SODIUM CHLORIDE 0.9% FLUSH
3.0000 mL | Freq: Two times a day (BID) | INTRAVENOUS | Status: DC
  Administered 2023-03-01 – 2023-03-04 (×6): 3 mL via INTRAVENOUS

## 2023-03-01 MED ORDER — INSULIN ASPART 100 UNIT/ML IJ SOLN
0.0000 [IU] | Freq: Every day | INTRAMUSCULAR | Status: DC
  Administered 2023-03-01: 3 [IU] via SUBCUTANEOUS

## 2023-03-01 MED ORDER — PROCHLORPERAZINE EDISYLATE 10 MG/2ML IJ SOLN
10.0000 mg | Freq: Once | INTRAMUSCULAR | Status: AC | PRN
  Administered 2023-03-01: 10 mg via INTRAVENOUS
  Filled 2023-03-01: qty 2

## 2023-03-01 MED ORDER — ACETAMINOPHEN 650 MG RE SUPP: 650.0000 mg | Freq: Four times a day (QID) | RECTAL | Status: DC | PRN

## 2023-03-01 MED ORDER — HYDRALAZINE HCL 20 MG/ML IJ SOLN
20.0000 mg | Freq: Once | INTRAMUSCULAR | Status: AC
  Administered 2023-03-01: 20 mg via INTRAVENOUS
  Filled 2023-03-01: qty 1

## 2023-03-01 MED ORDER — IPRATROPIUM-ALBUTEROL 0.5-2.5 (3) MG/3ML IN SOLN
3.0000 mL | Freq: Once | RESPIRATORY_TRACT | Status: AC
  Administered 2023-03-01: 3 mL via RESPIRATORY_TRACT
  Filled 2023-03-01: qty 3

## 2023-03-01 MED ORDER — ACETAMINOPHEN 325 MG PO TABS: 650.0000 mg | ORAL_TABLET | Freq: Four times a day (QID) | ORAL | Status: DC | PRN

## 2023-03-01 MED ORDER — GUAIFENESIN 100 MG/5ML PO LIQD
5.0000 mL | ORAL | Status: DC | PRN
  Administered 2023-03-02 – 2023-03-03 (×6): 5 mL via ORAL
  Filled 2023-03-01 (×6): qty 15

## 2023-03-01 MED ORDER — SENNOSIDES-DOCUSATE SODIUM 8.6-50 MG PO TABS: 1.0000 | ORAL_TABLET | Freq: Every evening | ORAL | Status: DC | PRN

## 2023-03-01 MED ORDER — FLUOXETINE HCL 20 MG PO CAPS
40.0000 mg | ORAL_CAPSULE | Freq: Every day | ORAL | Status: DC
  Administered 2023-03-02 – 2023-03-05 (×4): 40 mg via ORAL
  Filled 2023-03-01 (×4): qty 2

## 2023-03-01 MED ORDER — INSULIN ASPART 100 UNIT/ML IJ SOLN
0.0000 [IU] | Freq: Three times a day (TID) | INTRAMUSCULAR | Status: DC
  Administered 2023-03-02: 4 [IU] via SUBCUTANEOUS
  Administered 2023-03-03: 2 [IU] via SUBCUTANEOUS

## 2023-03-01 NOTE — ED Notes (Signed)
MD notified of pt bp. No new orders at this time. Will continue to monitor. Not on bp meds at home.

## 2023-03-01 NOTE — H&P (Signed)
History and Physical    Virginia [name] [id number] DOB: 1976/05/12 DOA: 03/01/2023  PCP: Audley Hose, MD   Patient coming from: Home   Chief Complaint: SOB, cough, abdominal pain  HPI: Virginia [name] is a 47 y.o. female with medical history significant for hypertension, type 2 diabetes mellitus, anxiety, chronic back pain, gastric bypass, now presenting to the emergency department with multiple complaints including shortness of breath, cough, and abdominal pain.  Patient reports approximately 6 weeks of cough and shortness of breath.  Cough has been nonproductive and she denies any fever, chills, or leg swelling associated with this.  She was started on azithromycin a couple weeks ago but did not have any improvement with this.  She has also developed severe abdominal pain.  She reports a long history of occasional abdominal pain and diarrhea without clear etiology.  She has been experiencing nausea last couple days, loose stools, and now severe pain in the upper abdomen.  ED Course: Upon arrival to the ED, patient is found to be afebrile and saturating well on room air with mild tachypnea and severely elevated blood pressure.  EKG demonstrates sinus rhythm with RBBB and LPFB.  Chest x-ray is negative for acute cardiopulmonary disease.  Labs are most notable for sodium 132, BUN 25, albumin less than 1.5, WBC 46,100, hemoglobin 7.2, and BNP 292.  Patient was given 20 mg IV hydralazine, 4 mg IV morphine, and 2 DuoNeb's in the ED.  Review of Systems:  All other systems reviewed and apart from HPI, are negative.  Past Medical History:  Diagnosis Date   Anxiety    Arthritis    Bipolar affective ([place])    Depression    Diabetes mellitus    Hypertension    Renal disorder     Past Surgical History:  Procedure Laterality Date   CESAREAN SECTION     CHOLECYSTECTOMY     GASTRIC BYPASS     KNEE SURGERY      Social History:   reports that she quit smoking about 10  years ago. Her smoking use included cigarettes. She smoked an average of .5 packs per day. She has never used smokeless tobacco. She reports current alcohol use. She reports that she does not use drugs.  Allergies  Allergen Reactions   Ace Inhibitors Nausea And Vomiting and Cough   Banana Nausea And Vomiting   Lamictal [Lamotrigine] Nausea And Vomiting   Nsaids Other (See Comments)    Kidney failure   Topiramate Nausea And Vomiting    Family History  Problem Relation Age of Onset   Asthma Mother    Hypertension Mother    Hypertension Father    Diabetes Mellitus II Father      Prior to Admission medications   Medication Sig Start Date End Date Taking? Authorizing Provider  acetaminophen (TYLENOL) 500 MG tablet Take 1,000 mg by mouth daily as needed for mild pain or headache.   Yes [provider]  albuterol (VENTOLIN HFA) 108 (90 Base) MCG/ACT inhaler Inhale 2 puffs every 4 (four) hours as needed into the lungs for wheezing or shortness of breath.   Yes [provider]  ALPRAZolam Duanne Moron) 1 MG tablet Take 1 mg by mouth 3 (three) times daily. 02/26/20  Yes [provider]  amphetamine-dextroamphetamine (ADDERALL) 20 MG tablet Take 20 mg by mouth 3 (three) times daily. 02/26/20  Yes [provider]  FLUoxetine (PROZAC) 40 MG capsule Take 40 mg by mouth daily. 12/21/19  Yes [provider]  metFORMIN (GLUCOPHAGE) 1000 MG tablet Take 0.5 tablets (500 mg total) by mouth 2 (two) times daily with a meal for 20 days. Patient taking differently: Take 500 mg by mouth daily. 06/25/21 02/29/24 Yes Caccavale, Sophia, PA-C  azithromycin (ZITHROMAX) 250 MG tablet Take 1 tablet (250 mg total) by mouth daily. Take first 2 tablets together, then 1 every day until finished. 01/24/23   Montine Circle, PA-C  benzonatate (TESSALON) 100 MG capsule Take 1 capsule (100 mg total) by mouth 2 (two) times daily as needed for cough. 01/24/23   Montine Circle, PA-C  docusate  sodium (COLACE) 100 MG capsule Take 1 capsule (100 mg total) by mouth daily. 07/16/22 07/16/23  Manuella Ghazi, Pratik D, DO  ferrous sulfate 325 (65 FE) MG tablet Take 1 tablet (325 mg total) by mouth daily. 07/16/22 07/16/23  Manuella Ghazi, Pratik D, DO  oxyCODONE (OXY IR/ROXICODONE) 5 MG immediate release tablet Take 1 tablet (5 mg total) by mouth every 4 (four) hours as needed for severe pain, breakthrough pain or moderate pain. 07/16/22   Manuella Ghazi, Pratik D, DO  Vitamin D, Ergocalciferol, (DRISDOL) 1.25 MG (50000 UNIT) CAPS capsule Take 1 capsule (50,000 Units total) by mouth every 7 (seven) days. 07/22/22   Heath Lark D, DO    Physical Exam: Vitals:   03/01/23 2015 03/01/23 2025 03/01/23 2028 03/01/23 2100  BP: (!) 191/103 (!) 191/103  116/67  Pulse:      Resp: 19   18  Temp:   98.1 F (36.7 C)   TempSrc:   Oral   SpO2:      Weight:      Height:         Constitutional: NAD, no pallor or diaphoresis  Eyes: PERTLA, lids and conjunctivae normal ENMT: Mucous membranes are moist. Posterior pharynx clear of any exudate or lesions.   Neck: supple, no masses  Respiratory: no wheezing, no crackles. No accessory muscle use.  Cardiovascular: S1 & S2 heard, regular rate and rhythm. No extremity edema.   Abdomen: No distension, soft, tender in upper abdomen. Bowel sounds active.  Musculoskeletal: no clubbing / cyanosis. No joint deformity upper and lower extremities.   Skin: no significant rashes, lesions, ulcers. Warm, dry, well-perfused. Neurologic: CN 2-12 grossly intact. Moving all extremities. Alert and oriented.  Psychiatric: Calm. Cooperative.    Labs and Imaging on Admission: I have personally reviewed following labs and imaging studies  CBC: Recent Labs  Lab 03/01/23 1625  WBC 46.1*  NEUTROABS 41.9*  HGB 7.2*  HCT 22.5*  MCV 100.0  PLT 123456   Basic Metabolic Panel: Recent Labs  Lab 03/01/23 1625  NA 132*  K 4.5  CL 106  CO2 17*  GLUCOSE 111*  BUN 25*  CREATININE 0.74  CALCIUM 7.0*    GFR: Estimated Creatinine Clearance: 84.7 mL/min (by C-G formula based on SCr of 0.74 mg/dL). Liver Function Tests: Recent Labs  Lab 03/01/23 1625  AST 21  ALT 6  ALKPHOS 134*  BILITOT 1.3*  PROT 6.2*  ALBUMIN <1.5*   No results for input(s): "LIPASE", "AMYLASE" in the last 168 hours. No results for input(s): "AMMONIA" in the last 168 hours. Coagulation Profile: No results for input(s): "INR", "PROTIME" in the last 168 hours. Cardiac Enzymes: No results for input(s): "CKTOTAL", "CKMB", "CKMBINDEX", "TROPONINI" in the last 168 hours. BNP (last 3 results) No results for input(s): "PROBNP" in the last 8760 hours. HbA1C: No results for input(s): "HGBA1C" in the last 72 hours. CBG: Recent Labs  Lab 03/01/23 1754 03/01/23 2015  GLUCAP 229* 220*   Lipid Profile: No results for input(s): "CHOL", "HDL", "LDLCALC", "TRIG", "CHOLHDL", "LDLDIRECT" in the last 72 hours. Thyroid Function Tests: No results for input(s): "TSH", "T4TOTAL", "FREET4", "T3FREE", "THYROIDAB" in the last 72 hours. Anemia Panel: No results for input(s): "VITAMINB12", "FOLATE", "FERRITIN", "TIBC", "IRON", "RETICCTPCT" in the last 72 hours. Urine analysis:    Component Value Date/Time   COLORURINE YELLOW 07/13/2022 1624   APPEARANCEUR HAZY (A) 07/13/2022 1624   APPEARANCEUR Hazy 07/09/2012 2031   LABSPEC 1.014 07/13/2022 1624   LABSPEC 1.016 07/09/2012 2031   PHURINE 5.0 07/13/2022 1624   GLUCOSEU 150 (A) 07/13/2022 1624   GLUCOSEU 150 mg/dL 07/09/2012 2031   HGBUR NEGATIVE 07/13/2022 1624   BILIRUBINUR NEGATIVE 07/13/2022 1624   BILIRUBINUR Negative 07/09/2012 2031   KETONESUR NEGATIVE 07/13/2022 1624   PROTEINUR 100 (A) 07/13/2022 1624   UROBILINOGEN 1.0 10/25/2012 1509   NITRITE NEGATIVE 07/13/2022 1624   LEUKOCYTESUR TRACE (A) 07/13/2022 1624   LEUKOCYTESUR Negative 07/09/2012 2031   Sepsis Labs: @LABRCNTIP (procalcitonin:4,lacticidven:4) )No results found for this or any previous visit (from  the past 240 hour(s)).   Radiological Exams on Admission: DG Chest 2 View  Result Date: 03/01/2023 CLINICAL DATA:  Cough and short of breath EXAM: CHEST - 2 VIEW COMPARISON:  None Available. FINDINGS: Normal mediastinum and cardiac silhouette. Normal pulmonary vasculature. No evidence of effusion, infiltrate, or pneumothorax. No acute bony abnormality. IMPRESSION: Normal chest radiograph Electronically Signed   By: Suzy Bouchard M.D.   On: 03/01/2023 17:03    EKG: Independently reviewed. Sinus rhythm, RBBB, LPFB.   Assessment/Plan   1. SOB, cough  - Presents with several weeks of progressive non-productive cough and SOB despite recent treatment with azithromycin   - No acute findings noted on CXR, no improvement with bronchodilators in ED  - Check CTA chest   2. Abdominal pain  - She is tender in upper abdomen with nausea and loose stools  - Check CT    3. Leukocytosis  - No fever or apparent infection  - Follow-up CT scans, culture if febrile   4. Type II DM  - A1c was 11.2% in August 2023  - Hold metformin for now, check CBGs, and use SSI    5. Anxiety  - Continue Xanax and Prozac    6. Anemia  - Hgb is 7.2 on admission  - She denies melena or hematochezia  - Check FOBT, check anemia panel, monitor     DVT prophylaxis: SCDs  Code Status: Full  Level of Care: Level of care: Telemetry Medical Family Communication: none present  Disposition Plan:  Patient is from: home  Anticipated d/c is to: home Anticipated d/c date is: Possibly as early as 3/20 or 03/03/23  Patient currently: Pending CT scans, stable respiratory status  Consults called: none  Admission status: Observation     Vianne Bulls, MD [facility]  03/01/2023, 9:55 PM

## 2023-03-01 NOTE — ED Provider Notes (Signed)
[facility] Provider Note   CSN: [id number] Arrival date & time: 03/01/23  1548     History  Chief Complaint  Patient presents with  . Chest Pain  . Cough  . Sore Throat  . Diarrhea    Virginia [name] is a 47 y.o. female.  No acute findings   Chest Pain Associated symptoms: cough   Cough Associated symptoms: chest pain   Sore Throat Associated symptoms include chest pain.  Diarrhea  C/o SOB and chest pain - here a month ago for same - had neg xray and labs - took Z pak and felt a bit better - a couple of weeks ago had cough came back - SOB has increased - CP is more of a "muscle hurt" from coughing - no radiation, has some pain with breathing.  No hx of DVT or thinners - she is on albuterol PRN - using recently.  Has trouble laying supine, taking robitussin.  No fever, mild Sore throat.  She reports a history of hypertension in the past and she is aware that she is allergic to ACE inhibitors but is not currently on any antihypertensives.  She has not seen her doctor in over a year.  She still smokes about a quarter of a pack of cigarettes per day.  Ultimately she complains that she has been coughing for the better part of 6 weeks, she got a little bit better but never stopped coughing after the Z-Pak.    Home Medications Prior to Admission medications   Medication Sig Start Date End Date Taking? Authorizing Provider  acetaminophen (TYLENOL) 500 MG tablet Take 1,000 mg by mouth daily as needed for mild pain or headache.    [provider]  albuterol (VENTOLIN HFA) 108 (90 Base) MCG/ACT inhaler Inhale 2 puffs every 4 (four) hours as needed into the lungs for wheezing or shortness of breath.    [provider]  ALPRAZolam Duanne Moron) 1 MG tablet Take 1 mg by mouth 3 (three) times daily. 02/26/20   [provider]  amphetamine-dextroamphetamine (ADDERALL) 20 MG tablet Take 20 mg by mouth 3 (three) times  daily. 02/26/20   [provider]  azithromycin (ZITHROMAX) 250 MG tablet Take 1 tablet (250 mg total) by mouth daily. Take first 2 tablets together, then 1 every day until finished. 01/24/23   Montine Circle, PA-C  benzonatate (TESSALON) 100 MG capsule Take 1 capsule (100 mg total) by mouth 2 (two) times daily as needed for cough. 01/24/23   Montine Circle, PA-C  docusate sodium (COLACE) 100 MG capsule Take 1 capsule (100 mg total) by mouth daily. 07/16/22 07/16/23  Manuella Ghazi, Pratik D, DO  ferrous sulfate 325 (65 FE) MG tablet Take 1 tablet (325 mg total) by mouth daily. 07/16/22 07/16/23  Manuella Ghazi, Pratik D, DO  FLUoxetine (PROZAC) 40 MG capsule Take 40 mg by mouth daily. 12/21/19   [provider]  metFORMIN (GLUCOPHAGE) 1000 MG tablet Take 0.5 tablets (500 mg total) by mouth 2 (two) times daily with a meal for 20 days. Patient taking differently: Take 500 mg by mouth daily. 06/25/21 09/18/22  Caccavale, Sophia, PA-C  oxyCODONE (OXY IR/ROXICODONE) 5 MG immediate release tablet Take 1 tablet (5 mg total) by mouth every 4 (four) hours as needed for severe pain, breakthrough pain or moderate pain. 07/16/22   Manuella Ghazi, Pratik D, DO  Vitamin D, Ergocalciferol, (DRISDOL) 1.25 MG (50000 UNIT) CAPS capsule Take 1 capsule (50,000 Units total) by mouth  every 7 (seven) days. 07/22/22   Manuella Ghazi, Pratik D, DO      Allergies    Ace inhibitors, Banana, Lamictal [lamotrigine], Nsaids, and Topiramate    Review of Systems   Review of Systems  Respiratory:  Positive for cough.   Cardiovascular:  Positive for chest pain.  Gastrointestinal:  Positive for diarrhea.  All other systems reviewed and are negative.   Physical Exam Updated Vital Signs There were no vitals taken for this visit. Physical Exam Vitals and nursing note reviewed.  Constitutional:      General: She is not in acute distress.    Appearance: She is well-developed.  HENT:     Head: Normocephalic and atraumatic.     Nose: Nose normal.      Mouth/Throat:     Mouth: Mucous membranes are moist.     Pharynx: No oropharyngeal exudate or posterior oropharyngeal erythema.  Eyes:     General: No scleral icterus.       Right eye: No discharge.        Left eye: No discharge.     Conjunctiva/sclera: Conjunctivae normal.     Pupils: Pupils are equal, round, and reactive to light.  Neck:     Thyroid: No thyromegaly.     Vascular: No JVD.  Cardiovascular:     Rate and Rhythm: Normal rate and regular rhythm.     Heart sounds: Normal heart sounds. No murmur heard.    No friction rub. No gallop.  Pulmonary:     Effort: Pulmonary effort is normal. No respiratory distress.     Breath sounds: Wheezing present. No rales.  Abdominal:     General: Bowel sounds are normal. There is no distension.     Palpations: Abdomen is soft. There is no mass.     Tenderness: There is no abdominal tenderness.  Musculoskeletal:        General: No tenderness. Normal range of motion.     Cervical back: Normal range of motion and neck supple.     Right lower leg: No edema.     Left lower leg: No edema.  Lymphadenopathy:     Cervical: No cervical adenopathy.  Skin:    General: Skin is warm and dry.     Findings: No erythema or rash.  Neurological:     Mental Status: She is alert.     Coordination: Coordination normal.  Psychiatric:        Behavior: Behavior normal.    ED Results / Procedures / Treatments   Labs (all labs ordered are listed, but only abnormal results are displayed) Labs Reviewed - No data to display  EKG None  Radiology No results found.  Procedures Procedures    Medications Ordered in ED Medications - No data to display  ED Course/ Medical Decision Making/ A&P                             Medical Decision Making Amount and/or Complexity of Data Reviewed Labs: ordered. Radiology: ordered. ECG/medicine tests: ordered.  Risk Prescription drug management. Decision regarding hospitalization.   This patient  presents to the ED for concern of coughing and shortness of breath, this involves an extensive number of treatment options, and is a complaint that carries with it a high risk of complications and morbidity.  The differential diagnosis includes COPD, heart failure, viral infection, pneumonitis   Co morbidities that complicate the patient evaluation  Obesity status post  gastric bypass Likely underlying undiagnosed hypertension   Additional history obtained:  Additional history obtained from electronic medical record External records from outside source obtained and reviewed including prior admission to the hospital for AKI last year   Lab Tests:  I Ordered, and personally interpreted labs.  The pertinent results include:  severe leukocytosis, virtually absent albumin   Imaging Studies ordered:  I ordered imaging studies including CXR  I independently visualized and interpreted imaging which showed no acute findings I agree with the radiologist interpretation   Cardiac Monitoring: / EKG:  The patient was maintained on a cardiac monitor.  I personally viewed and interpreted the cardiac monitored which showed an underlying rhythm of: Normal sinus rhythm   Consultations Obtained:  I requested consultation with the hospitalist,  and discussed lab and imaging findings as well as pertinent plan - they recommend: Admission to the hospital   Problem List / ED Course / Critical interventions / Medication management  I appreciate Dr. Myna Hidalgo and his willingness to admit this patient to the hospital.  The patient will be admitted for further evaluation.  I did try to page hematology but no return consult call was received.  Due to the patient's increasing abdominal discomfort and the unexplained nature of her shortness of breath a CT angiogram was ordered as well as a CT abdomen and pelvis. I ordered medication including hydralazine for hypertension, morphine for headache, albuterol and  ipratropium for shortness of breath for all of her symptoms Reevaluation of the patient after these medicines showed that the patient slightly improved I have reviewed the patients home medicines and have made adjustments as needed   Social Determinants of Health:  Chronic alcohol use   Test / Admission - Considered:  Will admit to the hospital          Final Clinical Impression(s) / ED Diagnoses Final diagnoses:  Severe anemia  Leukocytosis, unspecified type  Severe hypertension     Noemi Chapel, MD 03/01/23 2125

## 2023-03-01 NOTE — ED Notes (Signed)
Bgs 264. Glucometer not transferring at this time.

## 2023-03-01 NOTE — ED Triage Notes (Addendum)
Pt arrived via [facility] for home for chief complaints of chest pain, cough, sore throat, and diarrhea. Pt was seen for similar two weeks ago and prescribed Zpack without symptom relief. Pt is alert and oriented.   BP 200/110 HR 90 RR 18 SPO2 100 RA CBG 380

## 2023-03-02 ENCOUNTER — Observation Stay (HOSPITAL_COMMUNITY): Payer: Commercial Managed Care - HMO

## 2023-03-02 DIAGNOSIS — G4700 Insomnia, unspecified: Secondary | ICD-10-CM | POA: Diagnosis not present

## 2023-03-02 DIAGNOSIS — I452 Bifascicular block: Secondary | ICD-10-CM | POA: Diagnosis present

## 2023-03-02 DIAGNOSIS — E875 Hyperkalemia: Secondary | ICD-10-CM | POA: Diagnosis not present

## 2023-03-02 DIAGNOSIS — I5031 Acute diastolic (congestive) heart failure: Secondary | ICD-10-CM

## 2023-03-02 DIAGNOSIS — E1122 Type 2 diabetes mellitus with diabetic chronic kidney disease: Secondary | ICD-10-CM | POA: Diagnosis present

## 2023-03-02 DIAGNOSIS — Z9884 Bariatric surgery status: Secondary | ICD-10-CM | POA: Diagnosis not present

## 2023-03-02 DIAGNOSIS — F17210 Nicotine dependence, cigarettes, uncomplicated: Secondary | ICD-10-CM | POA: Diagnosis present

## 2023-03-02 DIAGNOSIS — G8929 Other chronic pain: Secondary | ICD-10-CM | POA: Diagnosis present

## 2023-03-02 DIAGNOSIS — E43 Unspecified severe protein-calorie malnutrition: Secondary | ICD-10-CM | POA: Diagnosis present

## 2023-03-02 DIAGNOSIS — L659 Nonscarring hair loss, unspecified: Secondary | ICD-10-CM | POA: Diagnosis present

## 2023-03-02 DIAGNOSIS — D72829 Elevated white blood cell count, unspecified: Secondary | ICD-10-CM | POA: Diagnosis present

## 2023-03-02 DIAGNOSIS — K219 Gastro-esophageal reflux disease without esophagitis: Secondary | ICD-10-CM | POA: Diagnosis not present

## 2023-03-02 DIAGNOSIS — R0602 Shortness of breath: Secondary | ICD-10-CM | POA: Diagnosis not present

## 2023-03-02 DIAGNOSIS — Z8249 Family history of ischemic heart disease and other diseases of the circulatory system: Secondary | ICD-10-CM | POA: Diagnosis not present

## 2023-03-02 DIAGNOSIS — F419 Anxiety disorder, unspecified: Secondary | ICD-10-CM | POA: Diagnosis present

## 2023-03-02 DIAGNOSIS — M1990 Unspecified osteoarthritis, unspecified site: Secondary | ICD-10-CM | POA: Diagnosis present

## 2023-03-02 DIAGNOSIS — J302 Other seasonal allergic rhinitis: Secondary | ICD-10-CM | POA: Diagnosis not present

## 2023-03-02 DIAGNOSIS — E1165 Type 2 diabetes mellitus with hyperglycemia: Secondary | ICD-10-CM | POA: Diagnosis present

## 2023-03-02 DIAGNOSIS — E669 Obesity, unspecified: Secondary | ICD-10-CM | POA: Diagnosis present

## 2023-03-02 DIAGNOSIS — I129 Hypertensive chronic kidney disease with stage 1 through stage 4 chronic kidney disease, or unspecified chronic kidney disease: Secondary | ICD-10-CM | POA: Diagnosis present

## 2023-03-02 DIAGNOSIS — D509 Iron deficiency anemia, unspecified: Secondary | ICD-10-CM | POA: Diagnosis present

## 2023-03-02 DIAGNOSIS — N179 Acute kidney failure, unspecified: Secondary | ICD-10-CM | POA: Diagnosis not present

## 2023-03-02 DIAGNOSIS — Z6833 Body mass index (BMI) 33.0-33.9, adult: Secondary | ICD-10-CM | POA: Diagnosis not present

## 2023-03-02 DIAGNOSIS — Z7984 Long term (current) use of oral hypoglycemic drugs: Secondary | ICD-10-CM | POA: Diagnosis not present

## 2023-03-02 DIAGNOSIS — J4521 Mild intermittent asthma with (acute) exacerbation: Secondary | ICD-10-CM | POA: Diagnosis not present

## 2023-03-02 LAB — IRON AND TIBC
Iron: 49 ug/dL (ref 28–170)
Saturation Ratios: 17 % (ref 10.4–31.8)
TIBC: 281 ug/dL (ref 250–450)
UIBC: 232 ug/dL

## 2023-03-02 LAB — COMPREHENSIVE METABOLIC PANEL
ALT: 21 U/L (ref 0–44)
AST: 29 U/L (ref 15–41)
Albumin: 2.3 g/dL — ABNORMAL LOW (ref 3.5–5.0)
Alkaline Phosphatase: 92 U/L (ref 38–126)
Anion gap: 7 (ref 5–15)
BUN: 15 mg/dL (ref 6–20)
CO2: 23 mmol/L (ref 22–32)
Calcium: 7.6 mg/dL — ABNORMAL LOW (ref 8.9–10.3)
Chloride: 107 mmol/L (ref 98–111)
Creatinine, Ser: 1.12 mg/dL — ABNORMAL HIGH (ref 0.44–1.00)
GFR, Estimated: >60 mL/min (ref >60)
Glucose, Bld: 120 mg/dL — ABNORMAL HIGH (ref 70–99)
Potassium: 3.8 mmol/L (ref 3.5–5.1)
Sodium: 137 mmol/L (ref 135–145)
Total Bilirubin: 0.6 mg/dL (ref 0.3–1.2)
Total Protein: 5.9 g/dL — ABNORMAL LOW (ref 6.5–8.1)

## 2023-03-02 LAB — CBC
HCT: 32.1 % — ABNORMAL LOW (ref 36.0–46.0)
Hemoglobin: 10.1 g/dL — ABNORMAL LOW (ref 12.0–15.0)
MCH: 28.7 pg (ref 26.0–34.0)
MCHC: 31.5 g/dL (ref 30.0–36.0)
MCV: 91.2 fL (ref 80.0–100.0)
Platelets: 234 10*3/uL (ref 150–400)
RBC: 3.52 MIL/uL — ABNORMAL LOW (ref 3.87–5.11)
RDW: 13 % (ref 11.5–15.5)
WBC: 9 10*3/uL (ref 4.0–10.5)
nRBC: 0 % (ref 0.0–0.2)

## 2023-03-02 LAB — ECHOCARDIOGRAM COMPLETE
AR max vel: 1.93 cm2
AV Area VTI: 1.98 cm2
AV Area mean vel: 1.93 cm2
AV Mean grad: 6 mmHg
AV Peak grad: 11.6 mmHg
Ao pk vel: 1.7 m/s
Area-P 1/2: 3.93 cm2
Calc EF: 63.3 %
Height: 61 in
S' Lateral: 1.9 cm
Single Plane A2C EF: 59.6 %
Single Plane A4C EF: 65.7 %
Weight: 2797.2 oz

## 2023-03-02 LAB — PROTIME-INR
INR: 0.9 (ref 0.8–1.2)
Prothrombin Time: 12.1 seconds (ref 11.4–15.2)

## 2023-03-02 LAB — GLUCOSE, CAPILLARY
Glucose-Capillary: 262 mg/dL — ABNORMAL HIGH (ref 70–99)
Glucose-Capillary: 116 mg/dL — ABNORMAL HIGH (ref 70–99)
Glucose-Capillary: 136 mg/dL — ABNORMAL HIGH (ref 70–99)
Glucose-Capillary: 345 mg/dL — ABNORMAL HIGH (ref 70–99)
Glucose-Capillary: 164 mg/dL — ABNORMAL HIGH (ref 70–99)

## 2023-03-02 LAB — BRAIN NATRIURETIC PEPTIDE: B Natriuretic Peptide: 191.6 pg/mL — ABNORMAL HIGH (ref 0.0–100.0)

## 2023-03-02 LAB — RETICULOCYTES
Immature Retic Fract: 16.7 % — ABNORMAL HIGH (ref 2.3–15.9)
RBC.: 3.46 MIL/uL — ABNORMAL LOW (ref 3.87–5.11)
Retic Count, Absolute: 48.1 10*3/uL (ref 19.0–186.0)
Retic Ct Pct: 1.4 % (ref 0.4–3.1)

## 2023-03-02 LAB — VITAMIN B12: Vitamin B-12: 219 pg/mL (ref 180–914)

## 2023-03-02 LAB — TYPE AND SCREEN
ABO/RH(D): O POS
Antibody Screen: NEGATIVE

## 2023-03-02 LAB — STREP PNEUMONIAE URINARY ANTIGEN: Strep Pneumo Urinary Antigen: NEGATIVE

## 2023-03-02 LAB — PROCALCITONIN: Procalcitonin: <0.1 ng/mL

## 2023-03-02 LAB — MAGNESIUM: Magnesium: 1.4 mg/dL — ABNORMAL LOW (ref 1.7–2.4)

## 2023-03-02 LAB — HEMOGLOBIN A1C
Hgb A1c MFr Bld: 12.2 % — ABNORMAL HIGH (ref 4.8–5.6)
Mean Plasma Glucose: 303 mg/dL

## 2023-03-02 LAB — FERRITIN: Ferritin: 18 ng/mL (ref 11–307)

## 2023-03-02 LAB — ABO/RH: ABO/RH(D): O POS

## 2023-03-02 LAB — FOLATE: Folate: 7.5 ng/mL (ref >5.9)

## 2023-03-02 MED ORDER — ADULT MULTIVITAMIN W/MINERALS CH
1.0000 | ORAL_TABLET | Freq: Every day | ORAL | Status: DC
  Administered 2023-03-02 – 2023-03-05 (×4): 1 via ORAL
  Filled 2023-03-02 (×4): qty 1

## 2023-03-02 MED ORDER — MAGNESIUM SULFATE 4 GM/100ML IV SOLN
4.0000 g | Freq: Once | INTRAVENOUS | Status: AC
  Administered 2023-03-02: 4 g via INTRAVENOUS
  Filled 2023-03-02: qty 100

## 2023-03-02 MED ORDER — ALPRAZOLAM 0.5 MG PO TABS
0.5000 mg | ORAL_TABLET | Freq: Two times a day (BID) | ORAL | Status: DC | PRN
  Administered 2023-03-02 – 2023-03-05 (×6): 0.5 mg via ORAL
  Filled 2023-03-02 (×6): qty 1

## 2023-03-02 MED ORDER — AZITHROMYCIN 500 MG PO TABS
500.0000 mg | ORAL_TABLET | Freq: Every day | ORAL | Status: DC
  Administered 2023-03-02: 500 mg via ORAL
  Filled 2023-03-02: qty 1

## 2023-03-02 MED ORDER — SODIUM CHLORIDE 0.9 % IV SOLN
1.0000 g | INTRAVENOUS | Status: DC
  Administered 2023-03-02: 1 g via INTRAVENOUS
  Filled 2023-03-02: qty 10

## 2023-03-02 MED ORDER — INSULIN GLARGINE-YFGN 100 UNIT/ML ~~LOC~~ SOLN
10.0000 [IU] | Freq: Every day | SUBCUTANEOUS | Status: DC
  Administered 2023-03-02 – 2023-03-03 (×2): 10 [IU] via SUBCUTANEOUS
  Filled 2023-03-02 (×2): qty 0.1

## 2023-03-02 MED ORDER — ENOXAPARIN SODIUM 40 MG/0.4ML IJ SOSY
40.0000 mg | PREFILLED_SYRINGE | INTRAMUSCULAR | Status: DC
  Administered 2023-03-02 – 2023-03-05 (×4): 40 mg via SUBCUTANEOUS
  Filled 2023-03-02 (×4): qty 0.4

## 2023-03-02 NOTE — Progress Notes (Addendum)
PROGRESS NOTE    Virginia [name]  [id number] DOB: Mar 02, 1976 DOA: 03/01/2023 PCP: Audley Hose, MD  46/F w/ hypertension, type 2 diabetes mellitus, anxiety, chronic back pain, gastric bypass, presented to the ED, with multiple complaints including shortness of breath, cough, and abdominal pain. Reports  6 weeks of nonproductive cough and shortness of breath. She was started on azithromycin a couple weeks ago, no improvement on this, also developed some abdominal pain, has had intermittent abdominal pain and diarrhea for a long time. -In the ED vital signs were stable, chest x-ray was unremarkable, initial CBC was abnormal, repeat CBC noted hemoglobin of 10.1, WBC 9.0, blood glucose of 260, creatinine 1.1, calcium 7.0, albumin 2.3  Subjective: -Drowsy after Xanax this morning, has chronic abdominal discomfort for months to years, dry cough and shortness of breath for several weeks  Assessment and Plan:  Cough, shortness of breath -Multifocal pneumonia versus fluid overload -Has mildly elevated JVD, will check echo, BNP -Empiric ceftriaxone/azithromycin, check sputum culture, urine Legionella and pneumococcal antigen, check procalcitonin  Severe protein calorie malnutrition Hypomagnesemia, hypocalcemia -History of gastric bypass surgery 2007 -Supplement magnesium, calcium may be falsely low with low albumin -Dietitian consult -Add multivitamins  Type 2 diabetes mellitus -Hemoglobin A1c was 11.2 now August, -Add Semglee, sliding scale insulin  Anxiety -Continue Xanax and Prozac, will decrease Xanax dose for oversedation  Mild iron deficiency anemia  DVT prophylaxis: Add Lovenox Code Status: Full code Family Communication: None present Disposition Plan: Home pending above workup  Consultants:    Procedures:   Antimicrobials:    Objective: Vitals:   03/01/23 2328 03/02/23 0325 03/02/23 0635 03/02/23 0811  BP: (!) 149/77 125/78 (!) 154/82 (!) 146/83  Pulse:  99 87 94 (!) 101  Resp: 18 18 18 18   Temp: 98.7 F (37.1 C) 98.1 F (36.7 C) 98.8 F (37.1 C) 98.3 F (36.8 C)  TempSrc: Oral Oral Oral Oral  SpO2: 100% 100% 100% 100%  Weight:      Height:        Intake/Output Summary (Last 24 hours) at 03/02/2023 1048 Last data filed at 03/02/2023 [id number] Gross per 24 hour  Intake 120 ml  Output 0 ml  Net 120 ml   Filed Weights   03/01/23 1625  Weight: 79.3 kg    Examination:  General exam: Obese chronically ill female sitting up in bed, AAOx3 HEENT: Positive JVD CVS: S1-S2, regular rhythm Lungs: Few basilar rhonchi Abdomen: Soft, nontender, bowel sounds present Extremities: No edema  Skin: No rashes Psychiatry: Drowsy, flat affect    Data Reviewed:   CBC: Recent Labs  Lab 03/01/23 1625 03/02/23 0237  WBC 46.1* 9.0  NEUTROABS 41.9*  --   HGB 7.2* 10.1*  HCT 22.5* 32.1*  MCV 100.0 91.2  PLT 213 [id number]   Basic Metabolic Panel: Recent Labs  Lab 03/01/23 1625 03/02/23 0237  NA 132* 137  K 4.5 3.8  CL 106 107  CO2 17* 23  GLUCOSE 111* 120*  BUN 25* 15  CREATININE 0.74 1.12*  CALCIUM 7.0* 7.6*  MG  --  1.4*   GFR: Estimated Creatinine Clearance: 60.5 mL/min (A) (by C-G formula based on SCr of 1.12 mg/dL (H)). Liver Function Tests: Recent Labs  Lab 03/01/23 1625 03/02/23 0237  AST 21 29  ALT 6 21  ALKPHOS 134* 92  BILITOT 1.3* 0.6  PROT 6.2* 5.9*  ALBUMIN <1.5* 2.3*   No results for input(s): "LIPASE", "AMYLASE" in the last 168 hours. No results for input(s): "  AMMONIA" in the last 168 hours. Coagulation Profile: Recent Labs  Lab 03/02/23 0237  INR 0.9   Cardiac Enzymes: No results for input(s): "CKTOTAL", "CKMB", "CKMBINDEX", "TROPONINI" in the last 168 hours. BNP (last 3 results) No results for input(s): "PROBNP" in the last 8760 hours. HbA1C: No results for input(s): "HGBA1C" in the last 72 hours. CBG: Recent Labs  Lab 03/01/23 1754 03/01/23 2015 03/02/23 0714  GLUCAP 229* 220* 116*   Lipid  Profile: No results for input(s): "CHOL", "HDL", "LDLCALC", "TRIG", "CHOLHDL", "LDLDIRECT" in the last 72 hours. Thyroid Function Tests: No results for input(s): "TSH", "T4TOTAL", "FREET4", "T3FREE", "THYROIDAB" in the last 72 hours. Anemia Panel: Recent Labs    03/02/23 0237  VITAMINB12 219  FOLATE 7.5  FERRITIN 18  TIBC 281  IRON 49  RETICCTPCT 1.4   Urine analysis:    Component Value Date/Time   COLORURINE YELLOW 07/13/2022 1624   APPEARANCEUR HAZY (A) 07/13/2022 1624   APPEARANCEUR Hazy 07/09/2012 2031   LABSPEC 1.014 07/13/2022 1624   LABSPEC 1.016 07/09/2012 2031   PHURINE 5.0 07/13/2022 1624   GLUCOSEU 150 (A) 07/13/2022 1624   GLUCOSEU 150 mg/dL 07/09/2012 2031   HGBUR NEGATIVE 07/13/2022 1624   BILIRUBINUR NEGATIVE 07/13/2022 1624   BILIRUBINUR Negative 07/09/2012 2031   KETONESUR NEGATIVE 07/13/2022 1624   PROTEINUR 100 (A) 07/13/2022 1624   UROBILINOGEN 1.0 10/25/2012 1509   NITRITE NEGATIVE 07/13/2022 1624   LEUKOCYTESUR TRACE (A) 07/13/2022 1624   LEUKOCYTESUR Negative 07/09/2012 2031   Sepsis Labs: @LABRCNTIP (procalcitonin:4,lacticidven:4)  )No results found for this or any previous visit (from the past 240 hour(s)).   Radiology Studies: CT Angio Chest PE W and/or Wo Contrast  Result Date: 03/01/2023 CLINICAL DATA:  Pulmonary embolism (PE) suspected, high prob. Chest pain, cough EXAM: CT ANGIOGRAPHY CHEST WITH CONTRAST TECHNIQUE: Multidetector CT imaging of the chest was performed using the standard protocol during bolus administration of intravenous contrast. Multiplanar CT image reconstructions and MIPs were obtained to evaluate the vascular anatomy. RADIATION DOSE REDUCTION: This exam was performed according to the departmental dose-optimization program which includes automated exposure control, adjustment of the mA and/or kV according to patient size and/or use of iterative reconstruction technique. CONTRAST:  71mL OMNIPAQUE IOHEXOL 350 MG/ML SOLN  COMPARISON:  None Available. FINDINGS: Cardiovascular: No filling defects in the pulmonary arteries to suggest pulmonary emboli. Heart is normal size. Aorta is normal caliber. Mediastinum/Nodes: No mediastinal, hilar, or axillary adenopathy. Trachea and esophagus are unremarkable. Thyroid unremarkable. Lungs/Pleura: Ground-glass airspace opacities in the lower lobes, right greater than left in the posterior right upper lobe. This could reflect early infiltrate/pneumonia. No effusions. Upper Abdomen: See abdominal CT report Musculoskeletal: Chest wall soft tissues are unremarkable. No acute bony abnormality. Review of the MIP images confirms the above findings. IMPRESSION: No evidence of pulmonary embolus. Bilateral lower lobe and posterior right upper lobe ground-glass opacities concerning for possible early infiltrate/pneumonia. Electronically Signed   By: Rolm Baptise M.D.   On: 03/01/2023 22:54   CT ABDOMEN PELVIS W CONTRAST  Result Date: 03/01/2023 CLINICAL DATA:  Abdominal pain, acute, nonlocalized EXAM: CT ABDOMEN AND PELVIS WITH CONTRAST TECHNIQUE: Multidetector CT imaging of the abdomen and pelvis was performed using the standard protocol following bolus administration of intravenous contrast. RADIATION DOSE REDUCTION: This exam was performed according to the departmental dose-optimization program which includes automated exposure control, adjustment of the mA and/or kV according to patient size and/or use of iterative reconstruction technique. CONTRAST:  50mL OMNIPAQUE IOHEXOL 350 MG/ML SOLN COMPARISON:  07/13/2022 FINDINGS: Lower chest: No acute abnormality. Subsegmental atelectasis in the lung bases. Hepatobiliary: No focal liver abnormality is seen. Status post cholecystectomy. No biliary dilatation. Pancreas: No focal abnormality or ductal dilatation. Spleen: No focal abnormality.  Normal size. Adrenals/Urinary Tract: No adrenal abnormality. No focal renal abnormality. No stones or hydronephrosis.  Urinary bladder is unremarkable. Stomach/Bowel: Prior gastric bypass. Stomach, large and small bowel grossly unremarkable. Vascular/Lymphatic: No evidence of aneurysm or adenopathy. Reproductive: Uterus and adnexa unremarkable.  No mass. Other: No free fluid or free air. Musculoskeletal: No acute bony abnormality. IMPRESSION: Prior gastric bypass. No acute findings in the abdomen or pelvis. Electronically Signed   By: Rolm Baptise M.D.   On: 03/01/2023 22:52   DG Chest 2 View  Result Date: 03/01/2023 CLINICAL DATA:  Cough and short of breath EXAM: CHEST - 2 VIEW COMPARISON:  None Available. FINDINGS: Normal mediastinum and cardiac silhouette. Normal pulmonary vasculature. No evidence of effusion, infiltrate, or pneumothorax. No acute bony abnormality. IMPRESSION: Normal chest radiograph Electronically Signed   By: Suzy Bouchard M.D.   On: 03/01/2023 17:03     Scheduled Meds:  ALPRAZolam  1 mg Oral TID   FLUoxetine  40 mg Oral Daily   insulin aspart  0-5 Units Subcutaneous QHS   insulin aspart  0-6 Units Subcutaneous TID WC   sodium chloride flush  3 mL Intravenous Q12H   Continuous Infusions:   LOS: 0 days    Time spent: 22min    Domenic Polite, MD [facility]   03/02/2023, 10:48 AM

## 2023-03-02 NOTE — Progress Notes (Signed)
Mobility Specialist Progress Note   03/02/23 1754  Mobility  Activity Ambulated independently in hallway  Level of Assistance Independent after set-up  Assistive Device None  Distance Ambulated (ft) 470 ft  Activity Response Tolerated well  Mobility Referral Yes  $Mobility charge 1 Mobility   Patient received in bed agreeable to participate in mobility. Ambulated in hallway independently with steady gait. Returned to room without complaint or incident. Was left back in bed with all needs met, call bell in reach.   Holland Falling Mobility Specialist Please contact via SecureChat or  Rehab office at [phone]

## 2023-03-03 ENCOUNTER — Other Ambulatory Visit (HOSPITAL_COMMUNITY): Payer: Self-pay

## 2023-03-03 ENCOUNTER — Telehealth (HOSPITAL_COMMUNITY): Payer: Self-pay | Admitting: Pharmacy Technician

## 2023-03-03 DIAGNOSIS — R052 Subacute cough: Secondary | ICD-10-CM

## 2023-03-03 DIAGNOSIS — R0602 Shortness of breath: Secondary | ICD-10-CM | POA: Diagnosis not present

## 2023-03-03 DIAGNOSIS — J302 Other seasonal allergic rhinitis: Secondary | ICD-10-CM | POA: Diagnosis not present

## 2023-03-03 DIAGNOSIS — K219 Gastro-esophageal reflux disease without esophagitis: Secondary | ICD-10-CM | POA: Diagnosis not present

## 2023-03-03 DIAGNOSIS — J4521 Mild intermittent asthma with (acute) exacerbation: Secondary | ICD-10-CM | POA: Diagnosis not present

## 2023-03-03 DIAGNOSIS — J309 Allergic rhinitis, unspecified: Secondary | ICD-10-CM

## 2023-03-03 LAB — CBC WITH DIFFERENTIAL/PLATELET
Abs Immature Granulocytes: 0.02 10*3/uL (ref 0.00–0.07)
Basophils Absolute: 0 10*3/uL (ref 0.0–0.1)
Basophils Relative: 0 %
Eosinophils Absolute: 0.2 10*3/uL (ref 0.0–0.5)
Eosinophils Relative: 2 %
HCT: 34.2 % — ABNORMAL LOW (ref 36.0–46.0)
Hemoglobin: 11 g/dL — ABNORMAL LOW (ref 12.0–15.0)
Immature Granulocytes: 0 %
Lymphocytes Relative: 28 %
Lymphs Abs: 2.6 10*3/uL (ref 0.7–4.0)
MCH: 29.4 pg (ref 26.0–34.0)
MCHC: 32.2 g/dL (ref 30.0–36.0)
MCV: 91.4 fL (ref 80.0–100.0)
Monocytes Absolute: 0.7 10*3/uL (ref 0.1–1.0)
Monocytes Relative: 7 %
Neutro Abs: 5.9 10*3/uL (ref 1.7–7.7)
Neutrophils Relative %: 63 %
Platelets: 248 10*3/uL (ref 150–400)
RBC: 3.74 MIL/uL — ABNORMAL LOW (ref 3.87–5.11)
RDW: 12.8 % (ref 11.5–15.5)
WBC: 9.4 10*3/uL (ref 4.0–10.5)
nRBC: 0 % (ref 0.0–0.2)

## 2023-03-03 LAB — GLUCOSE, CAPILLARY
Glucose-Capillary: 125 mg/dL — ABNORMAL HIGH (ref 70–99)
Glucose-Capillary: 233 mg/dL — ABNORMAL HIGH (ref 70–99)
Glucose-Capillary: 96 mg/dL (ref 70–99)
Glucose-Capillary: 447 mg/dL — ABNORMAL HIGH (ref 70–99)

## 2023-03-03 LAB — CBC
HCT: 30.1 % — ABNORMAL LOW (ref 36.0–46.0)
Hemoglobin: 9.9 g/dL — ABNORMAL LOW (ref 12.0–15.0)
MCH: 29.6 pg (ref 26.0–34.0)
MCHC: 32.9 g/dL (ref 30.0–36.0)
MCV: 90.1 fL (ref 80.0–100.0)
Platelets: 223 10*3/uL (ref 150–400)
RBC: 3.34 MIL/uL — ABNORMAL LOW (ref 3.87–5.11)
RDW: 13 % (ref 11.5–15.5)
WBC: 9 10*3/uL (ref 4.0–10.5)
nRBC: 0 % (ref 0.0–0.2)

## 2023-03-03 LAB — COMPREHENSIVE METABOLIC PANEL
ALT: 18 U/L (ref 0–44)
AST: 19 U/L (ref 15–41)
Albumin: 2.1 g/dL — ABNORMAL LOW (ref 3.5–5.0)
Alkaline Phosphatase: 90 U/L (ref 38–126)
Anion gap: 5 (ref 5–15)
BUN: 27 mg/dL — ABNORMAL HIGH (ref 6–20)
CO2: 22 mmol/L (ref 22–32)
Calcium: 7.6 mg/dL — ABNORMAL LOW (ref 8.9–10.3)
Chloride: 106 mmol/L (ref 98–111)
Creatinine, Ser: 1.42 mg/dL — ABNORMAL HIGH (ref 0.44–1.00)
GFR, Estimated: 46 mL/min — ABNORMAL LOW (ref >60)
Glucose, Bld: 190 mg/dL — ABNORMAL HIGH (ref 70–99)
Potassium: 4.3 mmol/L (ref 3.5–5.1)
Sodium: 133 mmol/L — ABNORMAL LOW (ref 135–145)
Total Bilirubin: 0.4 mg/dL (ref 0.3–1.2)
Total Protein: 5.6 g/dL — ABNORMAL LOW (ref 6.5–8.1)

## 2023-03-03 LAB — URINALYSIS, ROUTINE W REFLEX MICROSCOPIC
Bilirubin Urine: NEGATIVE
Glucose, UA: 50 mg/dL — AB
Hgb urine dipstick: NEGATIVE
Ketones, ur: NEGATIVE mg/dL
Nitrite: NEGATIVE
Protein, ur: 100 mg/dL — AB
Specific Gravity, Urine: 1.012 (ref 1.005–1.030)
pH: 5 (ref 5.0–8.0)

## 2023-03-03 LAB — RESPIRATORY PANEL BY PCR

## 2023-03-03 LAB — PROTEIN / CREATININE RATIO, URINE
Creatinine, Urine: 73 mg/dL
Protein Creatinine Ratio: 0.73 mg/mg{Cre} — ABNORMAL HIGH (ref 0.00–0.15)
Total Protein, Urine: 53 mg/dL

## 2023-03-03 LAB — LEGIONELLA PNEUMOPHILA SEROGP 1 UR AG: L. pneumophila Serogp 1 Ur Ag: NEGATIVE

## 2023-03-03 MED ORDER — LORATADINE 10 MG PO TABS
10.0000 mg | ORAL_TABLET | Freq: Every day | ORAL | Status: DC
  Administered 2023-03-03 – 2023-03-05 (×3): 10 mg via ORAL
  Filled 2023-03-03 (×3): qty 1

## 2023-03-03 MED ORDER — LIVING WELL WITH DIABETES BOOK
Freq: Once | Status: AC
  Filled 2023-03-03: qty 1

## 2023-03-03 MED ORDER — IPRATROPIUM-ALBUTEROL 0.5-2.5 (3) MG/3ML IN SOLN
3.0000 mL | RESPIRATORY_TRACT | Status: DC | PRN
  Administered 2023-03-04: 3 mL via RESPIRATORY_TRACT
  Filled 2023-03-03: qty 3

## 2023-03-03 MED ORDER — INSULIN ASPART 100 UNIT/ML IJ SOLN: 0.0000 [IU] | Freq: Three times a day (TID) | INTRAMUSCULAR | Status: DC

## 2023-03-03 MED ORDER — SODIUM CHLORIDE 0.9 % IV SOLN: INTRAVENOUS | Status: DC

## 2023-03-03 MED ORDER — MENTHOL 3 MG MT LOZG
1.0000 | LOZENGE | OROMUCOSAL | Status: DC | PRN
  Administered 2023-03-03: 3 mg via ORAL
  Filled 2023-03-03 (×2): qty 9

## 2023-03-03 MED ORDER — METHYLPREDNISOLONE SODIUM SUCC 40 MG IJ SOLR
40.0000 mg | Freq: Every day | INTRAMUSCULAR | Status: DC
  Administered 2023-03-03 – 2023-03-04 (×2): 40 mg via INTRAVENOUS
  Filled 2023-03-03 (×2): qty 1

## 2023-03-03 MED ORDER — CALCIUM CARBONATE ANTACID 500 MG PO CHEW
1.0000 | CHEWABLE_TABLET | Freq: Three times a day (TID) | ORAL | Status: DC | PRN
  Administered 2023-03-03 – 2023-03-04 (×2): 200 mg via ORAL
  Filled 2023-03-03 (×3): qty 1

## 2023-03-03 MED ORDER — FLUTICASONE PROPIONATE 50 MCG/ACT NA SUSP
2.0000 | Freq: Every day | NASAL | Status: DC
  Administered 2023-03-03 – 2023-03-05 (×3): 2 via NASAL
  Filled 2023-03-03: qty 16

## 2023-03-03 MED ORDER — INSULIN GLARGINE-YFGN 100 UNIT/ML ~~LOC~~ SOLN
12.0000 [IU] | Freq: Every day | SUBCUTANEOUS | Status: DC
  Administered 2023-03-04 – 2023-03-05 (×2): 12 [IU] via SUBCUTANEOUS
  Filled 2023-03-03 (×2): qty 0.12

## 2023-03-03 MED ORDER — PHENOL 1.4 % MT LIQD
1.0000 | OROMUCOSAL | Status: DC | PRN
  Administered 2023-03-03: 1 via OROMUCOSAL
  Filled 2023-03-03 (×2): qty 177

## 2023-03-03 MED ORDER — INSULIN ASPART 100 UNIT/ML IJ SOLN
7.0000 [IU] | Freq: Once | INTRAMUSCULAR | Status: AC
  Administered 2023-03-03: 7 [IU] via SUBCUTANEOUS

## 2023-03-03 MED ORDER — MONTELUKAST SODIUM 10 MG PO TABS
10.0000 mg | ORAL_TABLET | Freq: Every day | ORAL | Status: DC
  Administered 2023-03-03 – 2023-03-04 (×2): 10 mg via ORAL
  Filled 2023-03-03 (×2): qty 1

## 2023-03-03 MED ORDER — NYSTATIN 100000 UNIT/GM EX CREA
TOPICAL_CREAM | Freq: Two times a day (BID) | CUTANEOUS | Status: DC
  Filled 2023-03-03: qty 30

## 2023-03-03 MED ORDER — GUAIFENESIN 100 MG/5ML PO LIQD
10.0000 mL | ORAL | Status: DC | PRN
  Administered 2023-03-03 – 2023-03-05 (×7): 10 mL via ORAL
  Filled 2023-03-03 (×7): qty 15

## 2023-03-03 MED ORDER — MOMETASONE FURO-FORMOTEROL FUM 100-5 MCG/ACT IN AERO
2.0000 | INHALATION_SPRAY | Freq: Two times a day (BID) | RESPIRATORY_TRACT | Status: DC
  Administered 2023-03-04 – 2023-03-05 (×2): 2 via RESPIRATORY_TRACT
  Filled 2023-03-03 (×2): qty 8.8

## 2023-03-03 NOTE — Inpatient Diabetes Management (Addendum)
Inpatient Diabetes Program Recommendations  AACE/ADA: New Consensus Statement on Inpatient Glycemic Control (2015)  Target Ranges:  Prepandial:   less than 140 mg/dL      Peak postprandial:   less than 180 mg/dL (1-2 hours)      Critically ill patients:  140 - 180 mg/dL   Lab Results  Component Value Date   GLUCAP 233 (H) 03/03/2023   HGBA1C 12.2 (H) 03/02/2023    Review of Glycemic Control  Latest Reference Range & Units 03/03/23 07:18 03/03/23 11:17  Glucose-Capillary 70 - 99 mg/dL 125 (H) 233 (H)  (H): Data is abnormally high  Diabetes history: DM2 Outpatient Diabetes medications: Metformin 500 mg QD Current orders for Inpatient glycemic control: Semglee 10 units QD, Novolog 0-15 units TID  Inpatient Diabetes Program Recommendations:    Novolog 2-3 units TID with meals if postprandials remain elevated.    Spoke with patient at bedside.  Reviewed patient's current A1c of 12.2% (average BG of 303 mg/dL). Explained what a A1c is and what it measures. Also reviewed goal A1c with patient, importance of good glucose control @ home, and blood sugar goals.  She states she used to take long and rapid acting insulin prior to gastric bypass surgery in 2007.  Reviewed insulin pen use at home. Reviewed contents of insulin flexpen starter kit. Reviewed all steps of insulin pen including attachment of needle, 2-unit air shot, dialing up dose, giving injection, removing needle, disposal of sharps, storage of unused insulin, disposal of insulin etc. Patient able to provide successful return demonstration. Also reviewed troubleshooting with insulin pen. MD to give patient Rxs for insulin pens and insulin pen needles.    Educated on The Plate Method, CHO's, portion control, CBGs at home fasting and mid afternoon, F/U with PCP every 3 months, bring meter to PCP office, long and short term complications of uncontrolled BG, and importance of exercise.  She is interested in a CGM at discharge.  She has a  functioning glucometer at home.  She will also need a new prescription for Metformin at DC.    Asked pharmacy for benefit check.    Addendum @ 1530:  For DC:  Humalog insulin pen-Order # [id number] Basglar kwikpen-Order # [id number] Insulin pen needles-Order# [id number] [place] Night 3-Order [id number]  Will continue to follow while inpatient.  Thank you, Reche Dixon, MSN, [place] Diabetes Coordinator Inpatient Diabetes Program [phone] (team pager from 8a-5p)

## 2023-03-03 NOTE — Progress Notes (Addendum)
TRH night cross cover note:   I was notified by RN that the patient's nightly blood sugar is 447.  Currently is ordered for nightly sliding scale insulin.  Additionally, I noted that patient's basal insulin has already been adjusted, noting that the patient will receive the higher dose of insulin glargine 12 units SQ every morning tomorrow (3/22), representing an interval increase from 10 units subcu q. AM.  All of this is in the context of patient on systemic corticosteroids.  As opposed to the 5 units of sliding scale nightly insulin, I ordered NovoLog 7 units subcu x 1 dose now, with repeat CBG to be checked in 1 hour from now, and requested that patient's RN please contact me if ensuing CBG result remains greater than 400.  Update: repeat CBG shows marginal improvement, with blood sugar now noted to be 443. I subsequently ordered an additional 7 units of sq novolog.    Babs Bertin, DO Hospitalist .

## 2023-03-03 NOTE — Progress Notes (Signed)
Six minute walk performed on room air.  By the time it was over she was 99%.  During the walk she maintained in the 90's except when she walks/talks at same time, then 86%.  Pt asked to not talk and just walk and O2 sats 90's-100%.

## 2023-03-03 NOTE — Progress Notes (Signed)
Mobility Specialist Progress Note   03/03/23 1044  Mobility  Activity Refused mobility   Pt deferring mobility this morning stating "I just don't want to". Will f/u later this afternoon if time permits.   Virginia [name] Mobility Specialist Please contact via SecureChat or  Rehab office at [phone]

## 2023-03-03 NOTE — Telephone Encounter (Signed)
Patient Advocate Encounter  Prior Authorization for  [employer] Sensor  has been approved.    PA# [id number] Scientist, research (life sciences) Electronic PA Form Effective dates: 03/03/2023 through 03/02/2024      Lyndel Safe, [place] Patient Advocate Specialist [place] Patient Advocate Team Direct Number: [phone]  Fax: [phone]

## 2023-03-03 NOTE — Consult Note (Addendum)
NAME:  Virginia [name], MRN:  [id number], DOB:  20-Sep-1976, LOS: 1 ADMISSION DATE:  03/01/2023 CONSULTATION DATE:  03/03/2023 REFERRING MD:  Jamse Arn - [facility] CHIEF COMPLAINT:  Cough, SOB  History of Present Illness:  47 year old woman who presented to [facility] 3/19 for cough and SOB x 6 weeks. PMHx significant for HTN, T2DM, CKD, bipolar effective disorder, anxiety/depression.  Patient initially presented to [facility] ED 2/12 with 6 weeks of nonproductive cough and SOB, no associated fever/chills or LE edema. Given course of Azithromycin without improvement.   Given no improvement and persistent cough, returned to ED 3/19. Has also had intermittent wheezing. Denies any fevers but thinks has had some chills. Denies chest pain, N/V/D, abd pain, myalgias, exposures to known sick contacts. On ED arrival, patient was afebrile with SpO2 WNL on RA. Mildly tachypneic with hypertension. EKG with RBBB, otherwise unremarkable. CXR was unremarkable. CT A/P without acute findings.CTA Chest PE Protocol was completed 3/19 demonstrating bilateral lower lobe/posterior RUL GGOs c/f early infiltrate versus PNA, negative for PE. Lasix and antibiotics were initiated. Of note, PCT < 0.10 and antibiotics were subsequently stopped. On 3/21, six-minute walk test was performed and patient dropped only transiently to 86% when walking/talking simultaneously; otherwise maintained SpO2 in 90s.  PCCM was consulted for further workup of GGOs.  She denies any dysphagia or hx of aspiration. She is a smoker, roughly 10 pack year history. She does have 2nd hand smoke exposure in the home from other smokers. No wood stoves etc. Denies any pets in home (had a cat previously). Denies any exposures to irritant chemicals/fumes/etc.   Pertinent Medical History:   Past Medical History:  Diagnosis Date   Anxiety    Arthritis    Bipolar affective ([place])    Depression    Diabetes mellitus    Hypertension    Renal disorder    Significant  Hospital Events: Including procedures, antibiotic start and stop dates in addition to other pertinent events   3/19 - Presented to [facility] for SOB/cough x 6 weeks. CXR unremarkable. CTA Chest negative for PE, bilateral LL/RUL GGOs. 3/21 - Six-minute walk test with SpO2 transiently dropping to 86% with simultaneous walking/talking, otherwise SpO2 remained > 90%.  Interim History / Subjective:  PCCM consulted for further evaluation of GGOs  Objective:  Blood pressure (!) 149/82, pulse 92, temperature 98 F (36.7 C), temperature source Oral, resp. rate 18, height 5\' 1"  (1.549 m), weight 79.3 kg, SpO2 100 %.        Intake/Output Summary (Last 24 hours) at 03/03/2023 1624 Last data filed at 03/03/2023 1555 Gross per 24 hour  Intake 1240 ml  Output 400 ml  Net 840 ml   Filed Weights   03/01/23 1625  Weight: 79.3 kg   Physical Examination: General: Adult female, resting in bed, in NAD. Neuro: A&O x 3, no deficits. HEENT: [place]/AT. Sclerae anicteric. EOMI. Cardiovascular: RRR, no M/R/G.  Lungs: Respirations even and unlabored.  CTA bilaterally, No W/R/R. Abdomen: BS x 4, soft, NT/ND.  Musculoskeletal: No gross deformities, no edema.  Skin: Intact, warm, no rashes.  Assessment & Plan:   Nonproductive cough and SOB - was treated outpt with azithro with no improvement in. CTA Chest w/ groundglass opacities in RUL and b/l lower lobes; PCT <0.10, legionella/strep negative. ? HSP vs asthma/bronchitis (favored) vs allergic (she does endorse hx of allergies and has used Claritin and Zyrtec in the past) Tobacco abuse - ongoing smoker, ~10 pack year history - Assess RVP, IgE, CBC with  differential - Resp Cx/TA (expectorated sputum) sent 3/21 - Add daily Claritin, Singulair, Flonase - Bronchodilators (scheduled DuoNebs) - Will start Solumedrol to assess if she has a response. Short course given her hx DM - Don't think there is a role for further abx here - Guaifenesin - Pulmonary hygiene (IS,  flutter, encourage mobility) - Consider PFTs/outpatient pulmonary follow up - Encourage smoking cessation    Rest per primary team.  Best Practice: (right click and "Reselect all SmartList Selections" daily)  Per primary team.  Labs:  CBC: Recent Labs  Lab 03/01/23 1625 03/02/23 0237 03/03/23 0234  WBC 46.1* 9.0 9.0  NEUTROABS 41.9*  --   --   HGB 7.2* 10.1* 9.9*  HCT 22.5* 32.1* 30.1*  MCV 100.0 91.2 90.1  PLT 213 234 Q000111Q   Basic Metabolic Panel: Recent Labs  Lab 03/01/23 1625 03/02/23 0237 03/03/23 0234  NA 132* 137 133*  K 4.5 3.8 4.3  CL 106 107 106  CO2 17* 23 22  GLUCOSE 111* 120* 190*  BUN 25* 15 27*  CREATININE 0.74 1.12* 1.42*  CALCIUM 7.0* 7.6* 7.6*  MG  --  1.4*  --    GFR: Estimated Creatinine Clearance: 47.7 mL/min (A) (by C-G formula based on SCr of 1.42 mg/dL (H)). Recent Labs  Lab 03/01/23 1625 03/02/23 0237 03/03/23 0234  PROCALCITON  --  <0.10  --   WBC 46.1* 9.0 9.0   Liver Function Tests: Recent Labs  Lab 03/01/23 1625 03/02/23 0237 03/03/23 0234  AST 21 29 19   ALT 6 21 18   ALKPHOS 134* 92 90  BILITOT 1.3* 0.6 0.4  PROT 6.2* 5.9* 5.6*  ALBUMIN <1.5* 2.3* 2.1*   No results for input(s): "LIPASE", "AMYLASE" in the last 168 hours. No results for input(s): "AMMONIA" in the last 168 hours.  ABG:    Component Value Date/Time   HCO3 30.8 (H) 12/10/2010 1258   TCO2 22 11/22/2021 1530   O2SAT 83.0 12/10/2010 1258    Coagulation Profile: Recent Labs  Lab 03/02/23 0237  INR 0.9   Cardiac Enzymes: No results for input(s): "CKTOTAL", "CKMB", "CKMBINDEX", "TROPONINI" in the last 168 hours.  HbA1C: Hgb A1c MFr Bld  Date/Time Value Ref Range Status  03/02/2023 02:37 AM 12.2 (H) 4.8 - 5.6 % Final    Comment:    (NOTE)         Prediabetes: 5.7 - 6.4         Diabetes: >6.4         Glycemic control for adults with diabetes: <7.0   07/13/2022 05:20 PM 11.2 (H) 4.8 - 5.6 % Final    Comment:    (NOTE) Pre diabetes:           5.7%-6.4%  Diabetes:              >6.4%  Glycemic control for   <7.0% adults with diabetes    CBG: Recent Labs  Lab 03/02/23 1108 03/02/23 1610 03/02/23 2051 03/03/23 0718 03/03/23 1117  GLUCAP 136* 345* 164* 125* 233*   Review of Systems:   All negative; except for those that are bolded, which indicate positives.  Constitutional: weight loss, weight gain, night sweats, fevers, chills, fatigue, weakness.  HEENT: headaches, sore throat, sneezing, nasal congestion, post nasal drip, difficulty swallowing, tooth/dental problems, visual complaints, visual changes, ear aches. Neuro: difficulty with speech, weakness, numbness, ataxia. CV:  chest pain, orthopnea, PND, swelling in lower extremities, dizziness, palpitations, syncope.  Resp: cough, hemoptysis, dyspnea, wheezing. GI: heartburn,  indigestion, abdominal pain, nausea, vomiting, diarrhea, constipation, change in bowel habits, loss of appetite, hematemesis, melena, hematochezia.  GU: dysuria, change in color of urine, urgency or frequency, flank pain, hematuria. MSK: joint pain or swelling, decreased range of motion. Psych: change in mood or affect, depression, anxiety, suicidal ideations, homicidal ideations. Skin: rash, itching, bruising.   Past Medical History:  She,  has a past medical history of Anxiety, Arthritis, Bipolar affective ([place]), Depression, Diabetes mellitus, Hypertension, and Renal disorder.   Surgical History:   Past Surgical History:  Procedure Laterality Date   CESAREAN SECTION     CHOLECYSTECTOMY     GASTRIC BYPASS     KNEE SURGERY     Social History:   reports that she quit smoking about 10 years ago. Her smoking use included cigarettes. She smoked an average of .5 packs per day. She has never used smokeless tobacco. She reports current alcohol use. She reports that she does not use drugs.   Family History:  Her family history includes Asthma in her mother; Diabetes Mellitus II in her father;  Hypertension in her father and mother.   Allergies: Allergies  Allergen Reactions   Ace Inhibitors Nausea And Vomiting and Cough   Banana Nausea And Vomiting   Lamictal [Lamotrigine] Nausea And Vomiting   Nsaids Other (See Comments)    Kidney failure   Topiramate Nausea And Vomiting   Home Medications: Prior to Admission medications   Medication Sig Start Date End Date Taking? Authorizing Provider  acetaminophen (TYLENOL) 500 MG tablet Take 1,000 mg by mouth daily as needed for mild pain or headache.   Yes [provider]  albuterol (VENTOLIN HFA) 108 (90 Base) MCG/ACT inhaler Inhale 2 puffs every 4 (four) hours as needed into the lungs for wheezing or shortness of breath.   Yes [provider]  ALPRAZolam Duanne Moron) 1 MG tablet Take 1 mg by mouth 3 (three) times daily. 02/26/20  Yes [provider]  amphetamine-dextroamphetamine (ADDERALL) 20 MG tablet Take 20 mg by mouth 3 (three) times daily. 02/26/20  Yes [provider]  FLUoxetine (PROZAC) 40 MG capsule Take 40 mg by mouth daily. 12/21/19  Yes [provider]  metFORMIN (GLUCOPHAGE) 1000 MG tablet Take 0.5 tablets (500 mg total) by mouth 2 (two) times daily with a meal for 20 days. Patient taking differently: Take 500 mg by mouth daily. 06/25/21 02/29/24 Yes Caccavale, Sophia, PA-C  benzonatate (TESSALON) 100 MG capsule Take 1 capsule (100 mg total) by mouth 2 (two) times daily as needed for cough. Patient not taking: Reported on 03/01/2023 01/24/23   Montine Circle, PA-C     Montey Hora, [place] For pager details, please see AMION or use Epic chat  After 1900, please call [facility] for cross coverage needs 03/03/2023, 5:08 PM

## 2023-03-03 NOTE — Progress Notes (Addendum)
PROGRESS NOTE    Virginia [name]  [id number]  DOB: [birth year]  DOA: 03/01/2023 PCP: Audley Hose, MD Outpatient Specialists:   Hospital course: 47 year old female with HTN, poorly controlled DM 2, anxiety, s/p gastric bypass was admitted with subacute complaints of nonproductive cough x 6 weeks and shortness of breath.  Patient had been treated with azithromycin with partial improvement but no resolution of cough.  Patient subsequently developed shortness of breath and came to the ED.  Workup in the ED revealed afebrile patient with no oxygen requirement at rest.  CTA showed no evidence of PE however she did have bilateral lower lobe and posterior RUL groundglass opacities concerning for multifocal pneumonia versus pulmonary edema.  Patient was started empirically on Lasix and antibiotics.   Subjective:  Patient states she is very frustrated that nobody has told her what is going on.  Notes that she has been here 2 days and she should know what her diagnosis is by now.  We reviewed the results of a normal procalcitonin and BNP as well as results of her echocardiogram which showed concentric hypertrophy but normal EF.  I also noted that patient had elevated hemoglobin A1c of 12.2, patient admits that she has not been going to see the doctor for several years and has been doing diet control of her medications.  Had previously been on metformin.   Objective: Vitals:   03/02/23 1541 03/02/23 2043 03/03/23 0505 03/03/23 0818  BP: (!) 155/87 (!) 156/96 (!) 141/80 (!) 149/82  Pulse: 97 88 81 92  Resp: 18 18 18 18   Temp: 98.4 F (36.9 C) 98.1 F (36.7 C) 97.7 F (36.5 C) 98 F (36.7 C)  TempSrc: Oral Oral Oral Oral  SpO2: 100% 98% 98% 100%  Weight:      Height:        Intake/Output Summary (Last 24 hours) at 03/03/2023 1610 Last data filed at 03/03/2023 1555 Gross per 24 hour  Intake 1240 ml  Output 400 ml  Net 840 ml   Filed Weights   03/01/23 1625  Weight:  79.3 kg     Exam:  General: Patient with hoarse voice lying in bed in no respiratory distress able to speak in full sentences without difficulty. Eyes: sclera anicteric, conjuctiva mild injection bilaterally CVS: S1-S2, regular  Respiratory:  decreased air entry bilaterally secondary to decreased inspiratory effort, rales at bases  GI: NABS, soft, NT  LE: Warm and well-perfused Neuro: A/O x 3,  grossly nonfocal.   Data Reviewed:  Basic Metabolic Panel: Recent Labs  Lab 03/01/23 1625 03/02/23 0237 03/03/23 0234  NA 132* 137 133*  K 4.5 3.8 4.3  CL 106 107 106  CO2 17* 23 22  GLUCOSE 111* 120* 190*  BUN 25* 15 27*  CREATININE 0.74 1.12* 1.42*  CALCIUM 7.0* 7.6* 7.6*  MG  --  1.4*  --     CBC: Recent Labs  Lab 03/01/23 1625 03/02/23 0237 03/03/23 0234  WBC 46.1* 9.0 9.0  NEUTROABS 41.9*  --   --   HGB 7.2* 10.1* 9.9*  HCT 22.5* 32.1* 30.1*  MCV 100.0 91.2 90.1  PLT 213 234 223     Scheduled Meds:  enoxaparin (LOVENOX) injection  40 mg Subcutaneous Q24H   FLUoxetine  40 mg Oral Daily   insulin aspart  0-5 Units Subcutaneous QHS   insulin aspart  0-6 Units Subcutaneous TID WC   insulin glargine-yfgn  10 Units Subcutaneous Daily   multivitamin with minerals  1 tablet Oral Daily   nystatin cream   Topical BID   sodium chloride flush  3 mL Intravenous Q12H   Continuous Infusions:   Assessment & Plan:   Subacute cough with shortness of breath  Patient has had nonproductive cough x 6 weeks may be with partial improvement on Z-Pak She has more recently developed SOB/DOE Patient here is oxygenating well on room air however drops down to 86% with ambulation CT with groundglass opacities in RUL and bilateral lower lobes. Procalcitonin is less than 0.10, BNP is relatively normal at 150 Echocardiogram shows concentric hypertrophy with normal EF Pulmonary consult is requested for further workup of groundglass opacities  AKI Creatinine has significantly  increased from 0.7 on admission to 1.4 today Patient did get some Lasix on admission, now off of it She was also treated with ceftriaxone UA ordered with microscopy, has had proteinuria in the past, will order protein creatinine ratio however patient has no edema on physical exam.  Abnormal CBC CBC on admission showed WBC of 46 with hemoglobin of 7.2 however the following day WBC was 9 and hemoglobin was 10.  The latter numbers have been stable on repeat.  I do not think that the initial CBC from 319 is accurate.  Poorly controlled DM 2 Patient states she has not been taking her antidiabetes medication and has been trying to do diet control, we reviewed her markedly elevated hemoglobin A1c and she states she understands need for better control especially since she has had diabetes for a while. Of note--patient started on prednisone per pulmonary.  I have increased her as needed SSI coverage and increase glargine to 12 units, up from 10. Will request diabetes coordinator to fine-tune glargine/aspart regimen  History of gastric bypass 2007 Albumin is very low Dietitian consult is placed and pending  Anxiety and depression Continue Xanax and Prozac per home doses    DVT prophylaxis: Lovenox Code Status: Full Family Communication: Spoke with patient's Father Abelino Derrick at [id number] Disposition Plan: Home   Studies: ECHOCARDIOGRAM COMPLETE  Result Date: 03/02/2023    ECHOCARDIOGRAM REPORT   Patient Name:   Virginia [name] Date of Exam: 03/02/2023 Medical Rec #:  [id number]             Height:       61.0 in Accession #:    [id number]            Weight:       174.8 lb Date of Birth:  [birth year]              BSA:          1.784 m Patient Age:    47 years              BP:           146/83 mmHg Patient Gender: F                     HR:           87 bpm. Exam Location:  Inpatient Procedure: 2D Echo, Cardiac Doppler and Color Doppler Indications:    [id number] Acute diastolic (congestive) heart  failure  History:        Patient has no prior history of Echocardiogram examinations.                 CHF, Signs/Symptoms:Shortness of Breath; Risk  Factors:Hypertension and Diabetes.  Sonographer:    Rolla Etienne Referring Phys: Domenic Polite IMPRESSIONS  1. Left ventricular ejection fraction, by estimation, is 60 to 65%. The left ventricle has normal function. The left ventricle has no regional wall motion abnormalities. There is severe concentric left ventricular hypertrophy. Left ventricular diastolic  parameters are indeterminate.  2. Right ventricular systolic function is normal. The right ventricular size is normal. Mildly increased right ventricular wall thickness.  3. The mitral valve is normal in structure. No evidence of mitral valve regurgitation. No evidence of mitral stenosis.  4. The aortic valve is normal in structure. Aortic valve regurgitation is not visualized. No aortic stenosis is present.  5. The inferior vena cava is normal in size with greater than 50% respiratory variability, suggesting right atrial pressure of 3 mmHg.  6. Due to moderate-severe concentric LVH consider CMR to rule out infiltrative cardiomyopathy. FINDINGS  Left Ventricle: Left ventricular ejection fraction, by estimation, is 60 to 65%. The left ventricle has normal function. The left ventricle has no regional wall motion abnormalities. The left ventricular internal cavity size was normal in size. There is  severe concentric left ventricular hypertrophy. Left ventricular diastolic parameters are indeterminate. Right Ventricle: The right ventricular size is normal. Mildly increased right ventricular wall thickness. Right ventricular systolic function is normal. Left Atrium: Left atrial size was normal in size. Right Atrium: Right atrial size was normal in size. Pericardium: There is no evidence of pericardial effusion. Mitral Valve: The mitral valve is normal in structure. No evidence of mitral valve  regurgitation. No evidence of mitral valve stenosis. Tricuspid Valve: The tricuspid valve is normal in structure. Tricuspid valve regurgitation is mild . No evidence of tricuspid stenosis. Aortic Valve: The aortic valve is normal in structure. Aortic valve regurgitation is not visualized. No aortic stenosis is present. Aortic valve mean gradient measures 6.0 mmHg. Aortic valve peak gradient measures 11.6 mmHg. Aortic valve area, by VTI measures 1.98 cm. Pulmonic Valve: The pulmonic valve was normal in structure. Pulmonic valve regurgitation is not visualized. No evidence of pulmonic stenosis. Aorta: The aortic root is normal in size and structure. Venous: The inferior vena cava is normal in size with greater than 50% respiratory variability, suggesting right atrial pressure of 3 mmHg. IAS/Shunts: No atrial level shunt detected by color flow Doppler.  LEFT VENTRICLE PLAX 2D LVIDd:         3.00 cm     Diastology LVIDs:         1.90 cm     LV e' medial:    5.98 cm/s LV PW:         1.40 cm     LV E/e' medial:  16.9 LV IVS:        1.70 cm     LV e' lateral:   10.70 cm/s LVOT diam:     1.90 cm     LV E/e' lateral: 9.4 LV SV:         68 LV SV Index:   38 LVOT Area:     2.84 cm  LV Volumes (MOD) LV vol d, MOD A2C: 28.2 ml LV vol d, MOD A4C: 46.9 ml LV vol s, MOD A2C: 11.4 ml LV vol s, MOD A4C: 16.1 ml LV SV MOD A2C:     16.8 ml LV SV MOD A4C:     46.9 ml LV SV MOD BP:      23.5 ml RIGHT VENTRICLE RV Basal diam:  2.80 cm RV Mid diam:  2.30 cm RV S prime:     12.50 cm/s TAPSE (M-mode): 2.5 cm LEFT ATRIUM             Index        RIGHT ATRIUM           Index LA diam:        3.90 cm 2.19 cm/m   RA Area:     10.90 cm LA Vol (A2C):   31.0 ml 17.38 ml/m  RA Volume:   22.90 ml  12.84 ml/m LA Vol (A4C):   38.5 ml 21.58 ml/m LA Biplane Vol: 35.4 ml 19.85 ml/m  AORTIC VALVE AV Area (Vmax):    1.93 cm AV Area (Vmean):   1.93 cm AV Area (VTI):     1.98 cm AV Vmax:           170.00 cm/s AV Vmean:          119.000 cm/s AV  VTI:            0.344 m AV Peak Grad:      11.6 mmHg AV Mean Grad:      6.0 mmHg LVOT Vmax:         116.00 cm/s LVOT Vmean:        81.200 cm/s LVOT VTI:          0.240 m LVOT/AV VTI ratio: 0.70 MITRAL VALVE                TRICUSPID VALVE MV Area (PHT): 3.93 cm     TR Peak grad:   21.9 mmHg MV Decel Time: 193 msec     TR Vmax:        234.00 cm/s MV E velocity: 101.00 cm/s MV A velocity: 104.00 cm/s  SHUNTS MV E/A ratio:  0.97         Systemic VTI:  0.24 m                             Systemic Diam: 1.90 cm Aditya Sabharwal Electronically signed by Hebert Soho Signature Date/Time: 03/02/2023/3:16:42 PM    Final    CT Angio Chest PE W and/or Wo Contrast  Result Date: 03/01/2023 CLINICAL DATA:  Pulmonary embolism (PE) suspected, high prob. Chest pain, cough EXAM: CT ANGIOGRAPHY CHEST WITH CONTRAST TECHNIQUE: Multidetector CT imaging of the chest was performed using the standard protocol during bolus administration of intravenous contrast. Multiplanar CT image reconstructions and MIPs were obtained to evaluate the vascular anatomy. RADIATION DOSE REDUCTION: This exam was performed according to the departmental dose-optimization program which includes automated exposure control, adjustment of the mA and/or kV according to patient size and/or use of iterative reconstruction technique. CONTRAST:  11mL OMNIPAQUE IOHEXOL 350 MG/ML SOLN COMPARISON:  None Available. FINDINGS: Cardiovascular: No filling defects in the pulmonary arteries to suggest pulmonary emboli. Heart is normal size. Aorta is normal caliber. Mediastinum/Nodes: No mediastinal, hilar, or axillary adenopathy. Trachea and esophagus are unremarkable. Thyroid unremarkable. Lungs/Pleura: Ground-glass airspace opacities in the lower lobes, right greater than left in the posterior right upper lobe. This could reflect early infiltrate/pneumonia. No effusions. Upper Abdomen: See abdominal CT report Musculoskeletal: Chest wall soft tissues are unremarkable. No  acute bony abnormality. Review of the MIP images confirms the above findings. IMPRESSION: No evidence of pulmonary embolus. Bilateral lower lobe and posterior right upper lobe ground-glass opacities concerning for possible early infiltrate/pneumonia. Electronically Signed   By: Rolm Baptise M.D.  On: 03/01/2023 22:54   CT ABDOMEN PELVIS W CONTRAST  Result Date: 03/01/2023 CLINICAL DATA:  Abdominal pain, acute, nonlocalized EXAM: CT ABDOMEN AND PELVIS WITH CONTRAST TECHNIQUE: Multidetector CT imaging of the abdomen and pelvis was performed using the standard protocol following bolus administration of intravenous contrast. RADIATION DOSE REDUCTION: This exam was performed according to the departmental dose-optimization program which includes automated exposure control, adjustment of the mA and/or kV according to patient size and/or use of iterative reconstruction technique. CONTRAST:  58mL OMNIPAQUE IOHEXOL 350 MG/ML SOLN COMPARISON:  07/13/2022 FINDINGS: Lower chest: No acute abnormality. Subsegmental atelectasis in the lung bases. Hepatobiliary: No focal liver abnormality is seen. Status post cholecystectomy. No biliary dilatation. Pancreas: No focal abnormality or ductal dilatation. Spleen: No focal abnormality.  Normal size. Adrenals/Urinary Tract: No adrenal abnormality. No focal renal abnormality. No stones or hydronephrosis. Urinary bladder is unremarkable. Stomach/Bowel: Prior gastric bypass. Stomach, large and small bowel grossly unremarkable. Vascular/Lymphatic: No evidence of aneurysm or adenopathy. Reproductive: Uterus and adnexa unremarkable.  No mass. Other: No free fluid or free air. Musculoskeletal: No acute bony abnormality. IMPRESSION: Prior gastric bypass. No acute findings in the abdomen or pelvis. Electronically Signed   By: Rolm Baptise M.D.   On: 03/01/2023 22:52   DG Chest 2 View  Result Date: 03/01/2023 CLINICAL DATA:  Cough and short of breath EXAM: CHEST - 2 VIEW COMPARISON:  None  Available. FINDINGS: Normal mediastinum and cardiac silhouette. Normal pulmonary vasculature. No evidence of effusion, infiltrate, or pneumothorax. No acute bony abnormality. IMPRESSION: Normal chest radiograph Electronically Signed   By: Suzy Bouchard M.D.   On: 03/01/2023 17:03    Principal Problem:   SOB (shortness of breath) Active Problems:   Hypertension   Anxiety   Diabetes mellitus without complication ([place])   Abdominal pain   Normocytic anemia   Leukocytosis     Dinia Joynt Tublu Yaziel Brandon, [facility]  If 7PM-7AM, please contact night-coverage [email or web address]   LOS: 1 day

## 2023-03-03 NOTE — Telephone Encounter (Signed)
Patient Advocate Encounter   Received notification that prior authorization for FreeStyle Libre 3 Sensor is required.   PA submitted on 03/03/2023 [employer] Electronic PA Form Status is pending       Lyndel Safe, [place] Patient Advocate Specialist [place] Patient Advocate Team Direct Number: [phone]  Fax: [phone]

## 2023-03-03 NOTE — Progress Notes (Signed)
Strong odor of  Marijuana noted from patient's room, went into the room accompanied by CN, pt questioned about use of marijuana and pt strongly denies and became offensive and requesting to leave AMA, pt educated and reinforced about the smoking policy and pt verbalized understanding. Patient requesting to speak to MD. MD notified.

## 2023-03-04 ENCOUNTER — Telehealth: Payer: Self-pay | Admitting: Pulmonary Disease

## 2023-03-04 ENCOUNTER — Telehealth: Payer: Self-pay | Admitting: Physician Assistant

## 2023-03-04 DIAGNOSIS — R0602 Shortness of breath: Secondary | ICD-10-CM | POA: Diagnosis not present

## 2023-03-04 LAB — BASIC METABOLIC PANEL
Anion gap: 13 (ref 5–15)
BUN: 36 mg/dL — ABNORMAL HIGH (ref 6–20)
CO2: 17 mmol/L — ABNORMAL LOW (ref 22–32)
Calcium: 8.2 mg/dL — ABNORMAL LOW (ref 8.9–10.3)
Chloride: 98 mmol/L (ref 98–111)
Creatinine, Ser: 1.6 mg/dL — ABNORMAL HIGH (ref 0.44–1.00)
GFR, Estimated: 40 mL/min — ABNORMAL LOW (ref >60)
Glucose, Bld: 447 mg/dL — ABNORMAL HIGH (ref 70–99)
Potassium: 5.9 mmol/L — ABNORMAL HIGH (ref 3.5–5.1)
Sodium: 128 mmol/L — ABNORMAL LOW (ref 135–145)

## 2023-03-04 LAB — POTASSIUM
Potassium: 4.7 mmol/L (ref 3.5–5.1)
Potassium: 4.4 mmol/L (ref 3.5–5.1)
Potassium: 5.2 mmol/L — ABNORMAL HIGH (ref 3.5–5.1)
Potassium: 5 mmol/L (ref 3.5–5.1)

## 2023-03-04 LAB — GLUCOSE, CAPILLARY
Glucose-Capillary: 283 mg/dL — ABNORMAL HIGH (ref 70–99)
Glucose-Capillary: 346 mg/dL — ABNORMAL HIGH (ref 70–99)
Glucose-Capillary: 279 mg/dL — ABNORMAL HIGH (ref 70–99)
Glucose-Capillary: 181 mg/dL — ABNORMAL HIGH (ref 70–99)
Glucose-Capillary: 208 mg/dL — ABNORMAL HIGH (ref 70–99)
Glucose-Capillary: 470 mg/dL — ABNORMAL HIGH (ref 70–99)

## 2023-03-04 MED ORDER — INSULIN ASPART 100 UNIT/ML IJ SOLN: 0.0000 [IU] | INTRAMUSCULAR | Status: DC

## 2023-03-04 MED ORDER — INSULIN ASPART 100 UNIT/ML IJ SOLN
0.0000 [IU] | Freq: Every day | INTRAMUSCULAR | Status: DC
  Administered 2023-03-04: 5 [IU] via SUBCUTANEOUS

## 2023-03-04 MED ORDER — INSULIN ASPART 100 UNIT/ML IJ SOLN
3.0000 [IU] | Freq: Three times a day (TID) | INTRAMUSCULAR | Status: DC
  Administered 2023-03-04 – 2023-03-05 (×4): 3 [IU] via SUBCUTANEOUS

## 2023-03-04 MED ORDER — CALCIUM CARBONATE ANTACID 500 MG PO CHEW
1.0000 | CHEWABLE_TABLET | Freq: Once | ORAL | Status: AC
  Administered 2023-03-04: 200 mg via ORAL

## 2023-03-04 MED ORDER — INSULIN ASPART 100 UNIT/ML IJ SOLN
7.0000 [IU] | Freq: Once | INTRAMUSCULAR | Status: AC
  Administered 2023-03-04: 7 [IU] via SUBCUTANEOUS

## 2023-03-04 MED ORDER — SODIUM ZIRCONIUM CYCLOSILICATE 10 G PO PACK
10.0000 g | PACK | Freq: Four times a day (QID) | ORAL | Status: AC
  Administered 2023-03-04 (×2): 10 g via ORAL
  Filled 2023-03-04 (×2): qty 1

## 2023-03-04 MED ORDER — SODIUM CHLORIDE 0.9 % IV SOLN: INTRAVENOUS | Status: DC

## 2023-03-04 MED ORDER — INSULIN ASPART 100 UNIT/ML IV SOLN: 10.0000 [IU] | Freq: Once | INTRAVENOUS | Status: DC

## 2023-03-04 MED ORDER — INSULIN ASPART 100 UNIT/ML IJ SOLN
0.0000 [IU] | INTRAMUSCULAR | Status: DC
  Administered 2023-03-04: 8 [IU] via SUBCUTANEOUS

## 2023-03-04 MED ORDER — INSULIN ASPART 100 UNIT/ML IV SOLN
5.0000 [IU] | Freq: Once | INTRAVENOUS | Status: DC
  Filled 2023-03-04: qty 0.05

## 2023-03-04 MED ORDER — INSULIN ASPART 100 UNIT/ML IJ SOLN
0.0000 [IU] | Freq: Three times a day (TID) | INTRAMUSCULAR | Status: DC
  Administered 2023-03-04: 5 [IU] via SUBCUTANEOUS

## 2023-03-04 NOTE — Progress Notes (Signed)
TRH night cross cover note:   I was notified by RN of the patient's cbg of 470, in the absence of any current orders for nightly sliding scale insulin.  This appears slightly elevated relative to her average blood sugar of 310 per most recent hemoglobin A1c of 12.2.  I subsequently added nightly sliding scale insulin.    Babs Bertin, DO Hospitalist
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
## 2023-03-04 NOTE — Progress Notes (Signed)
Pt. Refused CBG draw and Lab blood draw.

## 2023-03-04 NOTE — Inpatient Diabetes Management (Addendum)
Inpatient Diabetes Program Recommendations  AACE/ADA: New Consensus Statement on Inpatient Glycemic Control (2015)  Target Ranges:  Prepandial:   less than 140 mg/dL      Peak postprandial:   less than 180 mg/dL (1-2 hours)      Critically ill patients:  140 - 180 mg/dL   Lab Results  Component Value Date   GLUCAP 346 (H) 03/04/2023   HGBA1C 12.2 (H) 03/02/2023    Review of Glycemic Control  Latest Reference Range & Units 03/03/23 20:27 03/04/23 07:54 03/04/23 08:37  Glucose-Capillary 70 - 99 mg/dL 447 (H) 283 (H) 346 (H)  (H): Data is abnormally high  Diabetes history: DM2 Outpatient Diabetes medications: Metformin 500 mg QD Current orders for Inpatient glycemic control: Semglee 12 units QD, Novolog 0-15 units Q4H  Inpatient Diabetes Program Recommendations:    Novolog 0-20 units TID and HS while on steroids Novolog 3 units TID with meals if she consumes at least 50%  Addendum@14 :19:  Placed Freestyle Libre 3 on the back of her left arm.  Explained how to use, alarms, how and when to remove.  She is very pleased and excited to use the CGM.    Will continue to follow while inpatient.  Thank you, Reche Dixon, MSN, [place] Diabetes Coordinator Inpatient Diabetes Program [phone] (team pager from 8a-5p)

## 2023-03-04 NOTE — Progress Notes (Signed)
Initial Nutrition Assessment  DOCUMENTATION CODES:   Obesity unspecified  INTERVENTION:  - Attach DM diet ed to discharge paperwork.   NUTRITION DIAGNOSIS:   Altered nutrition lab value related to chronic illness (DM) as evidenced by other (comment).  GOAL:   Other (Comment) (tighter blood sugar control)  MONITOR:   Labs  REASON FOR ASSESSMENT:   Consult Assessment of nutrition requirement/status  ASSESSMENT:   47 y.o. female admits related to SOB, cough and abdominal pain. PMH includes: anxiety, arthritis, bipolar affective, depression, DM, HTN, renal disorder. Pt is currently receiving medical management related to subacute cough with SOB.  Meds reviewed: sliding scale insulin, semglee, solumedrol, MVI, lokelma. Labs reviewed: Na low, K high, BUN/Creatinine elevated. FS BG 233-447 mg/dL. HgA1c 12.2%.   Pt was sleeping at time of assessment. Pt would not wake to sound of voice or knock on the door. Per record, pt ate 100% of her breakfast this am. Pt has experienced some weight loss. Will attempt to gather more detailed nutrition hx at f/u. RD will continue to monitor PO intakes.   NUTRITION - FOCUSED PHYSICAL EXAM:  Assess at f/u.   Diet Order:   Diet Order             Diet Carb Modified Fluid consistency: Thin; Room service appropriate? Yes  Diet effective now                   EDUCATION NEEDS:   Not appropriate for education at this time  Skin:  Skin Assessment: Reviewed RN Assessment  Last BM:  3/20  Height:   Ht Readings from Last 1 Encounters:  03/01/23 5\' 1"  (1.549 m)    Weight:   Wt Readings from Last 1 Encounters:  03/04/23 79.6 kg    Ideal Body Weight:     BMI:  Body mass index is 33.14 kg/m.  Estimated Nutritional Needs:   Kcal:  1590-1990 kcals  Protein:  80-100 gm  Fluid:  >/= 1.5 L  Thalia Bloodgood, RD, LDN, CNSC.
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
## 2023-03-04 NOTE — Progress Notes (Signed)
NAME:  Virginia [name], MRN:  [id number], DOB:  [birth year], LOS: 2 ADMISSION DATE:  03/01/2023 CONSULTATION DATE:  03/03/2023 REFERRING MD:  Jamse Arn - [facility] CHIEF COMPLAINT:  Cough, SOB  History of Present Illness:  47 year old woman who presented to [facility] 3/19 for cough and SOB x 6 weeks. PMHx significant for HTN, T2DM, CKD, bipolar effective disorder, anxiety/depression.  Patient initially presented to [facility] ED 2/12 with 6 weeks of nonproductive cough and SOB, no associated fever/chills or LE edema. Given course of Azithromycin without improvement.   Given no improvement and persistent cough, returned to ED 3/19. Has also had intermittent wheezing. Denies any fevers but thinks has had some chills. Denies chest pain, N/V/D, abd pain, myalgias, exposures to known sick contacts. On ED arrival, patient was afebrile with SpO2 WNL on RA. Mildly tachypneic with hypertension. EKG with RBBB, otherwise unremarkable. CXR was unremarkable. CT A/P without acute findings.CTA Chest PE Protocol was completed 3/19 demonstrating bilateral lower lobe/posterior RUL GGOs c/f early infiltrate versus PNA, negative for PE. Lasix and antibiotics were initiated. Of note, PCT < 0.10 and antibiotics were subsequently stopped. On 3/21, six-minute walk test was performed and patient dropped only transiently to 86% when walking/talking simultaneously; otherwise maintained SpO2 in 90s.  PCCM was consulted for further workup of GGOs.  She denies any dysphagia or hx of aspiration. She is a smoker, roughly 10 pack year history. She does have 2nd hand smoke exposure in the home from other smokers. No wood stoves etc. Denies any pets in home (had a cat previously). Denies any exposures to irritant chemicals/fumes/etc.   Pertinent Medical History:   Past Medical History:  Diagnosis Date   Anxiety    Arthritis    Bipolar affective ([place])    Depression    Diabetes mellitus    Hypertension    Renal disorder    Significant  Hospital Events: Including procedures, antibiotic start and stop dates in addition to other pertinent events   3/19 - Presented to [facility] for SOB/cough x 6 weeks. CXR unremarkable. CTA Chest negative for PE, bilateral LL/RUL GGOs. 3/21 - Six-minute walk test with SpO2 transiently dropping to 86% with simultaneous walking/talking, otherwise SpO2 remained > 90%.  Interim History / Subjective:  Cough slightly improved but still ongoing. Not much sputum.  Objective:  Blood pressure (!) 178/83, pulse 93, temperature 99.1 F (37.3 C), temperature source Oral, resp. rate 18, height 5\' 1"  (1.549 m), weight 79.6 kg, SpO2 100 %.        Intake/Output Summary (Last 24 hours) at 03/04/2023 1024 Last data filed at 03/04/2023 1010 Gross per 24 hour  Intake 3123.06 ml  Output 400 ml  Net 2723.06 ml    Filed Weights   03/01/23 1625 03/04/23 0703  Weight: 79.3 kg 79.6 kg   Physical Examination: General: Adult female, resting in bed, in NAD. Neuro: A&O x 3, no deficits. HEENT: [place]/AT. Sclerae anicteric. EOMI. Cardiovascular: RRR, no M/R/G.  Lungs: Respirations even and unlabored.  CTA bilaterally, No W/R/R. Abdomen: BS x 4, soft, NT/ND.  Musculoskeletal: No gross deformities, no edema.  Skin: Intact, warm, no rashes.  Assessment & Plan:   Nonproductive subacute cough and SOB - was treated outpt with azithro with no improvement in. RVP negative. CTA Chest w/ groundglass opacities in RUL and b/l lower lobes; PCT <0.10, legionella/strep negative. Suspect this is 2/2 mild intermittent asthma with acute exacerbation along with allergic rhinitis and GERD. Tobacco abuse - ongoing smoker, ~10 pack year history - F/u  on IgE. - Continue daily Claritin, Singulair, Flonase - Continue trial of Dulera - Continue Solumedrol but would be quick to wean off as she has already had worsening hyperglycemia - Don't think there is a role for further abx here - Continue Pepcid. - Pulmonary hygiene (IS, flutter,  encourage mobility) - Will need OP PFT's and OP follow up, we will arrange for this - Needs repeat CT scan in ~3 months - Encourage smoking cessation    Rest per primary team. Nothing further to add.  PCCM will sign off.  Please call us back if we can be of any further assistance.   Best Practice: (right click and "Reselect all SmartList Selections" daily)  Per primary team.   Montey Hora, [place] For pager details, please see AMION or use Epic chat  After 1900, please call [place] for cross coverage needs 03/04/2023, 10:29 AM

## 2023-03-04 NOTE — Discharge Instructions (Signed)

## 2023-03-04 NOTE — Hospital Course (Signed)
63 yof w/ HTN, poorly controlled DM 2, s/p gastric bypass,anxiety, presented subacute complaints of nonproductive cough x 6 weeks and shortness of breath.  Patient had been treated with azithromycin with partial improvement but no resolution of cough.  Patient subsequently developed shortness of breath and came to the ED. In ED: was afebrile patient with no oxygen requirement at rest.CTA showed no evidence of PE however she did have bilateral lower lobe and posterior RUL groundglass opacities concerning for multifocal pneumonia versus pulmonary edema.  Patient was started empirically on Lasix and antibiotics.  Patient was admitted pulmonary was consulted, placed on IV steroids following which she had uncontrolled hyperglycemia.
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
## 2023-03-04 NOTE — Telephone Encounter (Signed)
Patient scheduled on 04/01/2023 at 930am with Dr. Silas Flood. Paperwork mailed to address on file.

## 2023-03-04 NOTE — Telephone Encounter (Signed)
Please arrange hospital follow-up with Dr. Silas Flood in 3 to 4 weeks

## 2023-03-04 NOTE — Plan of Care (Signed)
  Problem: Education: Goal: Individualized Educational Video(s) Outcome: Progressing   Problem: Coping: Goal: Ability to adjust to condition or change in health will improve Outcome: Progressing   Problem: Fluid Volume: Goal: Ability to maintain a balanced intake and output will improve Outcome: Progressing   Problem: Metabolic: Goal: Ability to maintain appropriate glucose levels will improve Outcome: Progressing   Problem: Nutritional: Goal: Maintenance of adequate nutrition will improve Outcome: Progressing Goal: Progress toward achieving an optimal weight will improve Outcome: Progressing   Problem: Skin Integrity: Goal: Risk for impaired skin integrity will decrease Outcome: Progressing   Problem: Tissue Perfusion: Goal: Adequacy of tissue perfusion will improve Outcome: Progressing   Problem: Education: Goal: Knowledge of General Education information will improve Description: Including pain rating scale, medication(s)/side effects and non-pharmacologic comfort measures Outcome: Progressing   Problem: Health Behavior/Discharge Planning: Goal: Ability to manage health-related needs will improve Outcome: Progressing   Problem: Clinical Measurements: Goal: Ability to maintain clinical measurements within normal limits will improve Outcome: Progressing Goal: Will remain free from infection Outcome: Progressing Goal: Diagnostic test results will improve Outcome: Progressing Goal: Respiratory complications will improve Outcome: Progressing Goal: Cardiovascular complication will be avoided Outcome: Progressing   Problem: Activity: Goal: Risk for activity intolerance will decrease Outcome: Progressing   Problem: Coping: Goal: Level of anxiety will decrease Outcome: Progressing   Problem: Nutrition: Goal: Adequate nutrition will be maintained Outcome: Progressing   Problem: Elimination: Goal: Will not experience complications related to bowel  motility Outcome: Progressing Goal: Will not experience complications related to urinary retention Outcome: Progressing   Problem: Pain Managment: Goal: General experience of comfort will improve Outcome: Progressing   Problem: Safety: Goal: Ability to remain free from injury will improve Outcome: Progressing   Problem: Skin Integrity: Goal: Risk for impaired skin integrity will decrease Outcome: Progressing

## 2023-03-05 DIAGNOSIS — R0602 Shortness of breath: Secondary | ICD-10-CM | POA: Diagnosis not present

## 2023-03-05 LAB — BASIC METABOLIC PANEL
Anion gap: 6 (ref 5–15)
BUN: 42 mg/dL — ABNORMAL HIGH (ref 6–20)
CO2: 20 mmol/L — ABNORMAL LOW (ref 22–32)
Calcium: 8 mg/dL — ABNORMAL LOW (ref 8.9–10.3)
Chloride: 107 mmol/L (ref 98–111)
Creatinine, Ser: 1.38 mg/dL — ABNORMAL HIGH (ref 0.44–1.00)
GFR, Estimated: 48 mL/min — ABNORMAL LOW (ref >60)
Glucose, Bld: 301 mg/dL — ABNORMAL HIGH (ref 70–99)
Potassium: 4.3 mmol/L (ref 3.5–5.1)
Sodium: 133 mmol/L — ABNORMAL LOW (ref 135–145)

## 2023-03-05 LAB — POTASSIUM: Potassium: 4 mmol/L (ref 3.5–5.1)

## 2023-03-05 LAB — GLUCOSE, CAPILLARY
Glucose-Capillary: 87 mg/dL (ref 70–99)
Glucose-Capillary: 119 mg/dL — ABNORMAL HIGH (ref 70–99)
Glucose-Capillary: 168 mg/dL — ABNORMAL HIGH (ref 70–99)

## 2023-03-05 MED ORDER — BLOOD GLUCOSE TEST VI STRP: 1.0000 | ORAL_STRIP | Freq: Three times a day (TID) | 0 refills | Status: AC

## 2023-03-05 MED ORDER — LANCET DEVICE MISC: 1.0000 | Freq: Three times a day (TID) | 0 refills | Status: AC

## 2023-03-05 MED ORDER — MOMETASONE FURO-FORMOTEROL FUM 100-5 MCG/ACT IN AERO: 2.0000 | INHALATION_SPRAY | Freq: Two times a day (BID) | RESPIRATORY_TRACT | 0 refills | Status: DC

## 2023-03-05 MED ORDER — MELATONIN 5 MG PO TABS
5.0000 mg | ORAL_TABLET | Freq: Every evening | ORAL | Status: DC | PRN
  Filled 2023-03-05: qty 1

## 2023-03-05 MED ORDER — ORAL CARE MOUTH RINSE: 15.0000 mL | OROMUCOSAL | Status: DC | PRN

## 2023-03-05 MED ORDER — BLOOD GLUCOSE MONITORING SUPPL DEVI: 1.0000 | Freq: Three times a day (TID) | 0 refills | Status: DC

## 2023-03-05 MED ORDER — INSULIN PEN NEEDLE 31G X 5 MM MISC: 1.0000 | Freq: Every day | 0 refills | Status: AC

## 2023-03-05 MED ORDER — LORATADINE 10 MG PO TABS: 10.0000 mg | ORAL_TABLET | Freq: Every day | ORAL | 0 refills | Status: DC

## 2023-03-05 MED ORDER — INSULIN DETEMIR 100 UNIT/ML FLEXPEN: 12.0000 [IU] | PEN_INJECTOR | Freq: Every day | SUBCUTANEOUS | 0 refills | Status: DC

## 2023-03-05 MED ORDER — MONTELUKAST SODIUM 10 MG PO TABS: 10.0000 mg | ORAL_TABLET | Freq: Every day | ORAL | 0 refills | Status: DC

## 2023-03-05 MED ORDER — LANCETS MISC. MISC: 1.0000 | Freq: Three times a day (TID) | 0 refills | Status: AC

## 2023-03-05 MED ORDER — FLUTICASONE PROPIONATE 50 MCG/ACT NA SUSP: 2.0000 | Freq: Every day | NASAL | 0 refills | Status: DC

## 2023-03-05 NOTE — Plan of Care (Signed)
  Problem: Nutritional: Goal: Maintenance of adequate nutrition will improve Outcome: Progressing   Problem: Clinical Measurements: Goal: Respiratory complications will improve Outcome: Progressing Goal: Cardiovascular complication will be avoided Outcome: Progressing   Problem: Activity: Goal: Risk for activity intolerance will decrease Outcome: Progressing   Problem: Nutrition: Goal: Adequate nutrition will be maintained Outcome: Progressing   Problem: Coping: Goal: Level of anxiety will decrease Outcome: Progressing   Problem: Elimination: Goal: Will not experience complications related to urinary retention Outcome: Progressing

## 2023-03-05 NOTE — Progress Notes (Signed)
[facility] night cross cover note:   I was notified by RN of the patient's request for a sleep aid. I subsequently placed order for prn melatonin for insomnia.     Keelan Tripodi, DO Hospitalist  

## 2023-03-05 NOTE — Discharge Summary (Signed)
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 Final   Bordetella pertussis NOT DETECTED NOT DETECTED Final   Bordetella Parapertussis NOT DETECTED NOT DETECTED Final   Chlamydophila pneumoniae NOT DETECTED NOT DETECTED Final   Mycoplasma pneumoniae NOT DETECTED NOT DETECTED Final    Comment: Performed at [facility], [place]. [address], [place], [place] [address]   Time coordinating discharge: 25 minutes  SIGNED: Antonieta Pert, MD  [facility] 03/05/2023, 3:33 PM  If 7PM-7AM, please contact night-coverage [email or web address]

## 2023-03-05 NOTE — Progress Notes (Signed)
During the night, patient was very rude and argumentative.  Her CBG at HS was 470.  Even after education, she is still wanting to eat - requesting icees and peanut butter with graham crackers.  She walked to vending machine and got something to drink.  She told NT she was going to get a Snickers. I did not see it.  She disconnected her IVF - NS @ 100 and refused to let me reconnect the IV. I turned the pump off. Later she reconnected it and I restarted the IVFs.  She unhooked her Telemetry but did put it back on. Again this AM, she is off telemetry and refusing to put back on.  She mentioned several times leaving AMA but did not do so.  She stated "It will be a miracle if I leave here alive with the way I am treated."  She requested something for sleep after receiving xanax and oxycodone.  Dr. Velia Meyer ordered Melatonin but patient refused it.  Earleen Reaper RN

## 2023-03-09 LAB — IGE: IgE (Immunoglobulin E), Serum: 49 IU/mL (ref 6–495)

## 2023-03-17 ENCOUNTER — Other Ambulatory Visit (HOSPITAL_BASED_OUTPATIENT_CLINIC_OR_DEPARTMENT_OTHER): Payer: Self-pay

## 2023-03-17 DIAGNOSIS — J4521 Mild intermittent asthma with (acute) exacerbation: Secondary | ICD-10-CM

## 2023-03-21 ENCOUNTER — Encounter (HOSPITAL_BASED_OUTPATIENT_CLINIC_OR_DEPARTMENT_OTHER): Payer: Commercial Managed Care - HMO

## 2023-03-28 ENCOUNTER — Encounter: Payer: Commercial Managed Care - HMO | Admitting: Family

## 2023-03-28 NOTE — Progress Notes (Signed)
  This encounter was created in error - please disregard. No show 

## 2023-04-01 ENCOUNTER — Institutional Professional Consult (permissible substitution): Payer: Commercial Managed Care - HMO | Admitting: Pulmonary Disease

## 2023-04-06 ENCOUNTER — Encounter: Payer: Self-pay | Admitting: Pulmonary Disease

## 2023-04-19 ENCOUNTER — Other Ambulatory Visit: Payer: Self-pay

## 2023-04-19 ENCOUNTER — Encounter (HOSPITAL_COMMUNITY): Payer: Self-pay

## 2023-04-19 ENCOUNTER — Emergency Department (HOSPITAL_COMMUNITY)
Admission: EM | Admit: 2023-04-19 | Discharge: 2023-04-19 | Disposition: A | Discharge status: Home / Self Care | Payer: Commercial Managed Care - HMO | Attending: Emergency Medicine | Admitting: Emergency Medicine

## 2023-04-19 DIAGNOSIS — E1165 Type 2 diabetes mellitus with hyperglycemia: Secondary | ICD-10-CM | POA: Insufficient documentation

## 2023-04-19 DIAGNOSIS — I10 Essential (primary) hypertension: Secondary | ICD-10-CM | POA: Diagnosis not present

## 2023-04-19 DIAGNOSIS — Z79899 Other long term (current) drug therapy: Secondary | ICD-10-CM | POA: Diagnosis not present

## 2023-04-19 DIAGNOSIS — L02811 Cutaneous abscess of head [any part, except face]: Secondary | ICD-10-CM | POA: Diagnosis present

## 2023-04-19 DIAGNOSIS — Z794 Long term (current) use of insulin: Secondary | ICD-10-CM | POA: Diagnosis not present

## 2023-04-19 DIAGNOSIS — Z7984 Long term (current) use of oral hypoglycemic drugs: Secondary | ICD-10-CM | POA: Insufficient documentation

## 2023-04-19 DIAGNOSIS — L0291 Cutaneous abscess, unspecified: Secondary | ICD-10-CM

## 2023-04-19 DIAGNOSIS — R739 Hyperglycemia, unspecified: Secondary | ICD-10-CM

## 2023-04-19 DIAGNOSIS — R000 Tachycardia, unspecified: Secondary | ICD-10-CM | POA: Diagnosis not present

## 2023-04-19 LAB — CBC WITH DIFFERENTIAL/PLATELET
Abs Immature Granulocytes: 0.03 10*3/uL (ref 0.00–0.07)
Basophils Absolute: 0 10*3/uL (ref 0.0–0.1)
Basophils Relative: 0 %
Eosinophils Absolute: 0.3 10*3/uL (ref 0.0–0.5)
Eosinophils Relative: 3 %
HCT: 33.1 % — ABNORMAL LOW (ref 36.0–46.0)
Hemoglobin: 10.9 g/dL — ABNORMAL LOW (ref 12.0–15.0)
Immature Granulocytes: 0 %
Lymphocytes Relative: 31 %
Lymphs Abs: 3 10*3/uL (ref 0.7–4.0)
MCH: 29.3 pg (ref 26.0–34.0)
MCHC: 32.9 g/dL (ref 30.0–36.0)
MCV: 89 fL (ref 80.0–100.0)
Monocytes Absolute: 0.6 10*3/uL (ref 0.1–1.0)
Monocytes Relative: 7 %
Neutro Abs: 5.6 10*3/uL (ref 1.7–7.7)
Neutrophils Relative %: 59 %
Platelets: 238 10*3/uL (ref 150–400)
RBC: 3.72 MIL/uL — ABNORMAL LOW (ref 3.87–5.11)
RDW: 12.5 % (ref 11.5–15.5)
WBC: 9.6 10*3/uL (ref 4.0–10.5)
nRBC: 0 % (ref 0.0–0.2)

## 2023-04-19 LAB — URINALYSIS, ROUTINE W REFLEX MICROSCOPIC
Bilirubin Urine: NEGATIVE
Glucose, UA: >=500 mg/dL — AB
Ketones, ur: 5 mg/dL — AB
Leukocytes,Ua: NEGATIVE
Nitrite: NEGATIVE
Protein, ur: 100 mg/dL — AB
Specific Gravity, Urine: 1.02 (ref 1.005–1.030)
pH: 5 (ref 5.0–8.0)

## 2023-04-19 LAB — BASIC METABOLIC PANEL
Anion gap: 10 (ref 5–15)
BUN: 40 mg/dL — ABNORMAL HIGH (ref 6–20)
CO2: 18 mmol/L — ABNORMAL LOW (ref 22–32)
Calcium: 8.1 mg/dL — ABNORMAL LOW (ref 8.9–10.3)
Chloride: 106 mmol/L (ref 98–111)
Creatinine, Ser: 1.81 mg/dL — ABNORMAL HIGH (ref 0.44–1.00)
GFR, Estimated: 35 mL/min — ABNORMAL LOW (ref >60)
Glucose, Bld: 386 mg/dL — ABNORMAL HIGH (ref 70–99)
Potassium: 4.5 mmol/L (ref 3.5–5.1)
Sodium: 134 mmol/L — ABNORMAL LOW (ref 135–145)

## 2023-04-19 LAB — CBG MONITORING, ED
Glucose-Capillary: 66 mg/dL — ABNORMAL LOW (ref 70–99)
Glucose-Capillary: 201 mg/dL — ABNORMAL HIGH (ref 70–99)

## 2023-04-19 MED ORDER — DOXYCYCLINE HYCLATE 100 MG PO CAPS: 100.0000 mg | ORAL_CAPSULE | Freq: Two times a day (BID) | ORAL | 0 refills | Status: DC

## 2023-04-19 MED ORDER — DOXYCYCLINE HYCLATE 100 MG PO TABS
100.0000 mg | ORAL_TABLET | Freq: Once | ORAL | Status: AC
  Administered 2023-04-19: 100 mg via ORAL
  Filled 2023-04-19: qty 1

## 2023-04-19 MED ORDER — LIDOCAINE-EPINEPHRINE-TETRACAINE (LET) TOPICAL GEL
3.0000 mL | Freq: Once | TOPICAL | Status: AC
  Administered 2023-04-19: 3 mL via TOPICAL
  Filled 2023-04-19: qty 3

## 2023-04-19 MED ORDER — DOXYCYCLINE HYCLATE 100 MG PO CAPS: 100.0000 mg | ORAL_CAPSULE | Freq: Two times a day (BID) | ORAL | 0 refills | Status: AC

## 2023-04-19 MED ORDER — INSULIN DETEMIR 100 UNIT/ML FLEXPEN: 12.0000 [IU] | PEN_INJECTOR | Freq: Every day | SUBCUTANEOUS | 1 refills | Status: DC

## 2023-04-19 MED ORDER — FLUCONAZOLE 150 MG PO TABS: 150.0000 mg | ORAL_TABLET | Freq: Every day | ORAL | 0 refills | Status: DC

## 2023-04-19 MED ORDER — FLUCONAZOLE 150 MG PO TABS: 150.0000 mg | ORAL_TABLET | Freq: Every day | ORAL | 0 refills | Status: AC

## 2023-04-19 MED ORDER — INSULIN ASPART 100 UNIT/ML IJ SOLN
10.0000 [IU] | Freq: Once | INTRAMUSCULAR | Status: AC
  Administered 2023-04-19: 10 [IU] via INTRAVENOUS

## 2023-04-19 MED ORDER — SODIUM CHLORIDE 0.9 % IV BOLUS
1000.0000 mL | Freq: Once | INTRAVENOUS | Status: AC
  Administered 2023-04-19: 1000 mL via INTRAVENOUS

## 2023-04-19 MED ORDER — OXYCODONE-ACETAMINOPHEN 5-325 MG PO TABS
1.0000 | ORAL_TABLET | Freq: Once | ORAL | Status: AC
  Administered 2023-04-19: 1 via ORAL
  Filled 2023-04-19: qty 1

## 2023-04-19 NOTE — ED Notes (Signed)
RN gave pt cranberry juice and crackers. Care ongoing. Will recheck sugar in 20 minutes.

## 2023-04-19 NOTE — ED Provider Notes (Signed)
Assumed care from Berle Mull, PA-C at shift change pending repeat CBG after IV fluids.  See his note for full HPI.  In short, patient is a 47 year old female who presents to the ED due to abscess to scalp.  Previous provider performed I&D.  History of diabetes.  Patient found to be hyperglycemic with a slight bump in creatinine.  Plan from previous provider.  Finish IV fluids and recheck CBG. Physical Exam  BP (!) 144/94 (BP Location: Right Arm)   Pulse (!) 114   Temp 97.6 F (36.4 C) (Oral)   Resp 18   Ht 5\' 1"  (1.549 m)   Wt 79.4 kg   SpO2 95%   BMI 33.07 kg/m   Physical Exam Vitals and nursing note reviewed.  Constitutional:      General: She is not in acute distress.    Appearance: She is not ill-appearing.  HENT:     Head: Normocephalic.     Comments: Bandage to left side of posterior scalp with drainage on bandage. Eyes:     Pupils: Pupils are equal, round, and reactive to light.  Cardiovascular:     Rate and Rhythm: Normal rate and regular rhythm.     Pulses: Normal pulses.     Heart sounds: Normal heart sounds. No murmur heard.    No friction rub. No gallop.  Pulmonary:     Effort: Pulmonary effort is normal.     Breath sounds: Normal breath sounds.  Abdominal:     General: Abdomen is flat. There is no distension.     Palpations: Abdomen is soft.     Tenderness: There is no abdominal tenderness. There is no guarding or rebound.  Musculoskeletal:        General: Normal range of motion.     Cervical back: Neck supple.  Skin:    General: Skin is warm and dry.  Neurological:     General: No focal deficit present.     Mental Status: She is alert.  Psychiatric:        Mood and Affect: Mood normal.        Behavior: Behavior normal.     Procedures  Procedures  ED Course / MDM    Medical Decision Making Amount and/or Complexity of Data Reviewed Labs: ordered. Decision-making details documented in ED Course.  Risk Prescription drug  management.   Assumed care from Berle Mull, PA-C at shift change pending repeat CBG after IV fluids.  See his note for full MDM.  47 year old female presents to the ED due to abscess to scalp x 2 weeks.  Previous provider performed I&D.  Patient found to be hyperglycemic with a bump in her creatinine from baseline.  Patient given IV fluids.  No evidence of DKA on labs.  Awaiting IV fluids and recheck CBG at shift change.  6:54 AM reassessed patient at bedside.  IV fluids running.  Will recheck CBG after IV fluids to ensure glucose has improved.  Patient asymptomatic.  Patient also requesting a dose of fluconazole for antibiotic induced yeast infection.  Repeat glucose 66, patient had been here for over 6 hours with no food.  Patient given food with improvement in glucose to 200s.  No evidence of DKA on labs.  Patient finished IV fluids.  Patient discharged with doxycycline and insulin per previous provider.  Added fluconazole for antibiotic induced yeast infections per request of patient.  Advised patient to follow-up with PCP within the next few days to recheck glucose.  Mannie Stabile, PA-C 04/19/23 1014    Lowther, Amy, DO 04/21/23 [phone]

## 2023-04-19 NOTE — ED Provider Notes (Signed)
[place] EMERGENCY DEPARTMENT AT [facility] Provider Note   CSN: [id number] Arrival date & time: 04/19/23  0256     History  Chief Complaint  Patient presents with   Abscess    Pt has scalp abscess raised draining onset 2 weeks    Virginia [name] is a 47 y.o. female.  HPI   Patient with medical history including diabetes, hypertension, bipolar, anxiety, presenting with complaints of a scalp abscess.  States that she noticed it 2 weeks ago but has gotten worse, more painful, states it has been draining and larger.  denies any fevers chills cough congestion.  She states that she has been trying home remedies without much relief.  She also notes that her blood sugar has been running high, in the upper 200s to low 300s, states she has been compliant with her diabetes medication.  She endorses frequent thirst increased urination, associated nausea without any vomiting.  Reviewed patient's chart recently admitted to the hospital and March for anemia as well as shortness of breath, due to the steroids and bronchodilators blood sugar came uncontrolled.  Patient was eventually discharged home on metformin as well as long-acting insulin.  Home Medications Prior to Admission medications   Medication Sig Start Date End Date Taking? Authorizing Provider  acetaminophen (TYLENOL) 500 MG tablet Take 1,000 mg by mouth daily as needed for mild pain or headache.    [provider]  albuterol (VENTOLIN HFA) 108 (90 Base) MCG/ACT inhaler Inhale 2 puffs every 4 (four) hours as needed into the lungs for wheezing or shortness of breath.    [provider]  ALPRAZolam Prudy Feeler) 1 MG tablet Take 1 mg by mouth 3 (three) times daily. 02/26/20   [provider]  amphetamine-dextroamphetamine (ADDERALL) 20 MG tablet Take 20 mg by mouth 3 (three) times daily. 02/26/20   [provider]  Blood Glucose Monitoring Suppl DEVI 1 each by Does not apply route in the  morning, at noon, and at bedtime. May substitute to any manufacturer covered by patient's insurance. 03/05/23   Lanae Boast, MD  doxycycline (VIBRAMYCIN) 100 MG capsule Take 1 capsule (100 mg total) by mouth 2 (two) times daily for 7 days. 04/19/23 04/26/23  Carroll Sage, PA-C  FLUoxetine (PROZAC) 40 MG capsule Take 40 mg by mouth daily. 12/21/19   [provider]  fluticasone (FLONASE) 50 MCG/ACT nasal spray Place 2 sprays into both nostrils daily. 03/06/23 04/05/23  Lanae Boast, MD  insulin detemir (LEVEMIR) 100 UNIT/ML FlexPen Inject 12 Units into the skin daily. 04/19/23 06/18/23  Carroll Sage, PA-C  loratadine (CLARITIN) 10 MG tablet Take 1 tablet (10 mg total) by mouth daily. 03/06/23 04/05/23  Lanae Boast, MD  metFORMIN (GLUCOPHAGE) 1000 MG tablet Take 0.5 tablets (500 mg total) by mouth 2 (two) times daily with a meal for 20 days. Patient taking differently: Take 500 mg by mouth daily. 06/25/21 02/29/24  Caccavale, Sophia, PA-C  mometasone-formoterol (DULERA) 100-5 MCG/ACT AERO Inhale 2 puffs into the lungs 2 (two) times daily. 03/05/23 04/04/23  Lanae Boast, MD  montelukast (SINGULAIR) 10 MG tablet Take 1 tablet (10 mg total) by mouth at bedtime. 03/05/23 04/04/23  Lanae Boast, MD      Allergies    Ace inhibitors, Banana, Lamictal [lamotrigine], Nsaids, and Topiramate    Review of Systems   Review of Systems  Constitutional:  Negative for chills and fever.  Respiratory:  Negative for shortness of breath.   Cardiovascular:  Negative for chest  pain.  Gastrointestinal:  Negative for abdominal pain.  Skin:  Positive for wound.  Neurological:  Negative for headaches.    Physical Exam Updated Vital Signs BP (!) 149/90 (BP Location: Right Arm)   Pulse 97   Temp 98.1 F (36.7 C) (Oral)   Resp 16   Ht 5\' 1"  (1.549 m)   Wt 79.4 kg   SpO2 96%   BMI 33.07 kg/m  Physical Exam Vitals and nursing note reviewed.  Constitutional:      General: She is not in acute distress.     Appearance: She is not ill-appearing.  HENT:     Head: Normocephalic and atraumatic.     Nose: No congestion.  Eyes:     Conjunctiva/sclera: Conjunctivae normal.  Cardiovascular:     Rate and Rhythm: Regular rhythm. Tachycardia present.     Pulses: Normal pulses.     Heart sounds: No murmur heard.    No friction rub. No gallop.  Pulmonary:     Effort: No respiratory distress.     Breath sounds: No wheezing, rhonchi or rales.  Skin:    General: Skin is warm and dry.     Comments: Patient is a marble size abscess on the left parietal lobe, slightly draining, with slight erythema, induration and fluctuance present.  Neurological:     Mental Status: She is alert.  Psychiatric:        Mood and Affect: Mood normal.     ED Results / Procedures / Treatments   Labs (all labs ordered are listed, but only abnormal results are displayed) Labs Reviewed  BASIC METABOLIC PANEL - Abnormal; Notable for the following components:      Result Value   Sodium 134 (*)    CO2 18 (*)    Glucose, Bld 386 (*)    BUN 40 (*)    Creatinine, Ser 1.81 (*)    Calcium 8.1 (*)    GFR, Estimated 35 (*)    All other components within normal limits  CBC WITH DIFFERENTIAL/PLATELET - Abnormal; Notable for the following components:   RBC 3.72 (*)    Hemoglobin 10.9 (*)    HCT 33.1 (*)    All other components within normal limits  URINALYSIS, ROUTINE W REFLEX MICROSCOPIC - Abnormal; Notable for the following components:   APPearance HAZY (*)    Glucose, UA >=500 (*)    Hgb urine dipstick SMALL (*)    Ketones, ur 5 (*)    Protein, ur 100 (*)    Bacteria, UA FEW (*)    All other components within normal limits  I-STAT VENOUS BLOOD GAS, ED    EKG None  Radiology No results found.  Procedures .Marland KitchenIncision and Drainage  Date/Time: 04/19/2023 5:06 AM  Performed by: Carroll Sage, PA-C Authorized by: Carroll Sage, PA-C   Consent:    Consent obtained:  Verbal   Consent given by:   Patient   Risks discussed:  Bleeding, incomplete drainage, pain, damage to other organs and infection   Alternatives discussed:  No treatment Universal protocol:    Patient identity confirmed:  Verbally with patient Location:    Type:  Abscess   Size:  1cm   Location:  Head   Head location:  Scalp Pre-procedure details:    Skin preparation:  Antiseptic wash Sedation:    Sedation type:  None Anesthesia:    Anesthesia method:  Topical application   Topical anesthetic:  LET Procedure type:    Complexity:  Simple  Procedure details:    Ultrasound guidance: no     Needle aspiration: no     Incision types:  Stab incision   Incision depth:  Subcutaneous   Wound management:  Probed and deloculated   Drainage:  Bloody and purulent   Drainage amount:  Scant Post-procedure details:    Procedure completion:  Tolerated well, no immediate complications     Medications Ordered in ED Medications  lidocaine-EPINEPHrine-tetracaine (LET) topical gel (3 mLs Topical Given 04/19/23 0416)  oxyCODONE-acetaminophen (PERCOCET/ROXICET) 5-325 MG per tablet 1 tablet (1 tablet Oral Given 04/19/23 0515)  insulin aspart (novoLOG) injection 10 Units (10 Units Intravenous Given 04/19/23 0602)  sodium chloride 0.9 % bolus 1,000 mL (1,000 mLs Intravenous New Bag/Given 04/19/23 0602)  doxycycline (VIBRA-TABS) tablet 100 mg (100 mg Oral Given 04/19/23 2956)    ED Course/ Medical Decision Making/ A&P                             Medical Decision Making Amount and/or Complexity of Data Reviewed Labs: ordered.  Risk Prescription drug management.   This patient presents to the ED for concern of abscess, this involves an extensive number of treatment options, and is a complaint that carries with it a high risk of complications and morbidity.  The differential diagnosis includes necrotizing fasciitis, deep tissue infection, sepsis    Additional history obtained:  Additional history obtained from N/A External  records from outside source obtained and reviewed including recent hospitalization   Co morbidities that complicate the patient evaluation  Diabetes,  Social Determinants of Health:  No primary care provider    Lab Tests:  I Ordered, and personally interpreted labs.  The pertinent results include: CBC shows normocytic anemia hemoglobin 10.9, BMP reveals CO2 of 18, glucose 386, BUN 40, creatinine 1.8 slightly above baseline, UA shows 5 ketones, blood blood cells few bacteria   Imaging Studies ordered:  I ordered imaging studies including n/a  I independently visualized and interpreted imaging which showed n/a I agree with the radiologist interpretation   Cardiac Monitoring:  The patient was maintained on a cardiac monitor.  I personally viewed and interpreted the cardiac monitored which showed an underlying rhythm of: n/a   Medicines ordered and prescription drug management:  I ordered medication including lidocaine, oxycodone I have reviewed the patients home medicines and have made adjustments as needed  Critical Interventions:  N/A   Reevaluation:  Presents with an abscess, will recommend I&D for improved wound healing, will also add on basic lab workup due to her complaint of elevated blood sugar increased thirst/urination.  Patient has elevated glucose of 385, without anion gap, does have a slight decrease in CO2, suspect this is dehydration related, will provide her with insulin, fluids.   Consultations Obtained:  N/a    Test Considered:  N/a    Rule out Doubt necrotizing fasciitis presentation atypical etiology more consistent with simple abscess.  I doubt DKA or HHS she has no anion gap, only few ketones present.  Glucose is slightly elevated but not not as high as I would expect to see for HHS., she does have a low CO2 but I suspect this secondary due to dehydration which is consistent with the slight increase in creatinine, she was given a liter of  fluids.  Doubt systemic infection patient nontoxic-appearing, vital signs reassuring, no leukocytosis present.      Dispostion and problem list  Due to shift change patient handed  off to Claudette Stapler [facility] reassessed patient after fluids and insulin if improved patient be discharged home  Abscess-due to patient's poorly controlled diabetes we will start her on antibiotics, have her follow-up with PCP for further assessment. Hyperglycemia-will write provider with refills of her diabetes medication and follow-up with her primary care provider.            Final Clinical Impression(s) / ED Diagnoses Final diagnoses:  None    Rx / DC Orders ED Discharge Orders          Ordered    insulin detemir (LEVEMIR) 100 UNIT/ML FlexPen  Daily        04/19/23 0604    doxycycline (VIBRAMYCIN) 100 MG capsule  2 times daily,   Status:  Discontinued        04/19/23 0604    doxycycline (VIBRAMYCIN) 100 MG capsule  2 times daily        04/19/23 0604    Ambulatory referral to Social Work        04/19/23 0605              Carroll Sage, PA-C 04/19/23 4098    Tilden Fossa, MD 04/19/23 [phone]

## 2023-04-19 NOTE — Discharge Instructions (Addendum)
Your blood sugar was elevated today, please continue with your home medication, continue information in regards to helping with your blood sugar control please review you may follow-up with community health and wellness for further evaluation. Abscess-you had a scalp abscess which I have drained, please apply warm compresses to the area, start you on antibiotics please take as prescribed.  Come back to the emergency department if you develop chest pain, shortness of breath, severe abdominal pain, uncontrolled nausea, vomiting, diarrhea.

## 2023-05-06 ENCOUNTER — Other Ambulatory Visit (HOSPITAL_COMMUNITY): Payer: Self-pay

## 2023-05-06 MED ORDER — AMPHETAMINE-DEXTROAMPHETAMINE 30 MG PO TABS
30.0000 mg | ORAL_TABLET | Freq: Two times a day (BID) | ORAL | 0 refills | Status: DC
  Filled 2023-05-06: qty 60, 30d supply, fill #0

## 2023-05-22 ENCOUNTER — Inpatient Hospital Stay (HOSPITAL_COMMUNITY)
Admission: EM | Admit: 2023-05-22 | Discharge: 2023-05-25 | DRG: 066 | Disposition: A | Discharge status: Home / Self Care | Payer: Medicaid Other | Attending: Internal Medicine | Admitting: Internal Medicine

## 2023-05-22 ENCOUNTER — Emergency Department (HOSPITAL_COMMUNITY): Payer: Medicaid Other

## 2023-05-22 ENCOUNTER — Encounter (HOSPITAL_COMMUNITY): Payer: Self-pay | Admitting: *Deleted

## 2023-05-22 DIAGNOSIS — E669 Obesity, unspecified: Secondary | ICD-10-CM | POA: Diagnosis present

## 2023-05-22 DIAGNOSIS — I160 Hypertensive urgency: Secondary | ICD-10-CM | POA: Diagnosis present

## 2023-05-22 DIAGNOSIS — I639 Cerebral infarction, unspecified: Principal | ICD-10-CM | POA: Diagnosis present

## 2023-05-22 DIAGNOSIS — Z6833 Body mass index (BMI) 33.0-33.9, adult: Secondary | ICD-10-CM

## 2023-05-22 DIAGNOSIS — F419 Anxiety disorder, unspecified: Secondary | ICD-10-CM | POA: Diagnosis present

## 2023-05-22 DIAGNOSIS — N1831 Chronic kidney disease, stage 3a: Secondary | ICD-10-CM | POA: Diagnosis present

## 2023-05-22 DIAGNOSIS — K920 Hematemesis: Secondary | ICD-10-CM | POA: Diagnosis not present

## 2023-05-22 DIAGNOSIS — E1122 Type 2 diabetes mellitus with diabetic chronic kidney disease: Secondary | ICD-10-CM | POA: Diagnosis present

## 2023-05-22 DIAGNOSIS — I10 Essential (primary) hypertension: Secondary | ICD-10-CM | POA: Diagnosis not present

## 2023-05-22 DIAGNOSIS — Z7984 Long term (current) use of oral hypoglycemic drugs: Secondary | ICD-10-CM

## 2023-05-22 DIAGNOSIS — R29700 NIHSS score 0: Secondary | ICD-10-CM | POA: Diagnosis present

## 2023-05-22 DIAGNOSIS — I4510 Unspecified right bundle-branch block: Secondary | ICD-10-CM | POA: Diagnosis not present

## 2023-05-22 DIAGNOSIS — Z7982 Long term (current) use of aspirin: Secondary | ICD-10-CM

## 2023-05-22 DIAGNOSIS — Z794 Long term (current) use of insulin: Secondary | ICD-10-CM

## 2023-05-22 DIAGNOSIS — R1084 Generalized abdominal pain: Secondary | ICD-10-CM | POA: Diagnosis not present

## 2023-05-22 DIAGNOSIS — H532 Diplopia: Secondary | ICD-10-CM | POA: Diagnosis present

## 2023-05-22 DIAGNOSIS — E1165 Type 2 diabetes mellitus with hyperglycemia: Secondary | ICD-10-CM | POA: Diagnosis present

## 2023-05-22 DIAGNOSIS — I129 Hypertensive chronic kidney disease with stage 1 through stage 4 chronic kidney disease, or unspecified chronic kidney disease: Secondary | ICD-10-CM | POA: Diagnosis present

## 2023-05-22 DIAGNOSIS — R739 Hyperglycemia, unspecified: Secondary | ICD-10-CM | POA: Diagnosis not present

## 2023-05-22 DIAGNOSIS — F319 Bipolar disorder, unspecified: Secondary | ICD-10-CM | POA: Diagnosis present

## 2023-05-22 DIAGNOSIS — Z79899 Other long term (current) drug therapy: Secondary | ICD-10-CM

## 2023-05-22 DIAGNOSIS — F17210 Nicotine dependence, cigarettes, uncomplicated: Secondary | ICD-10-CM | POA: Diagnosis present

## 2023-05-22 DIAGNOSIS — Z8249 Family history of ischemic heart disease and other diseases of the circulatory system: Secondary | ICD-10-CM

## 2023-05-22 DIAGNOSIS — Z833 Family history of diabetes mellitus: Secondary | ICD-10-CM

## 2023-05-22 DIAGNOSIS — E875 Hyperkalemia: Secondary | ICD-10-CM | POA: Diagnosis present

## 2023-05-22 DIAGNOSIS — Z888 Allergy status to other drugs, medicaments and biological substances status: Secondary | ICD-10-CM

## 2023-05-22 DIAGNOSIS — Z7951 Long term (current) use of inhaled steroids: Secondary | ICD-10-CM

## 2023-05-22 DIAGNOSIS — Z9049 Acquired absence of other specified parts of digestive tract: Secondary | ICD-10-CM

## 2023-05-22 DIAGNOSIS — E785 Hyperlipidemia, unspecified: Secondary | ICD-10-CM | POA: Diagnosis present

## 2023-05-22 DIAGNOSIS — R42 Dizziness and giddiness: Secondary | ICD-10-CM

## 2023-05-22 DIAGNOSIS — Z9884 Bariatric surgery status: Secondary | ICD-10-CM

## 2023-05-22 DIAGNOSIS — E119 Type 2 diabetes mellitus without complications: Secondary | ICD-10-CM

## 2023-05-22 DIAGNOSIS — Z7902 Long term (current) use of antithrombotics/antiplatelets: Secondary | ICD-10-CM

## 2023-05-22 DIAGNOSIS — I6350 Cerebral infarction due to unspecified occlusion or stenosis of unspecified cerebral artery: Principal | ICD-10-CM | POA: Diagnosis present

## 2023-05-22 DIAGNOSIS — R1110 Vomiting, unspecified: Secondary | ICD-10-CM | POA: Diagnosis not present

## 2023-05-22 DIAGNOSIS — Z886 Allergy status to analgesic agent status: Secondary | ICD-10-CM

## 2023-05-22 DIAGNOSIS — Z825 Family history of asthma and other chronic lower respiratory diseases: Secondary | ICD-10-CM

## 2023-05-22 DIAGNOSIS — R29810 Facial weakness: Secondary | ICD-10-CM | POA: Diagnosis present

## 2023-05-22 DIAGNOSIS — R300 Dysuria: Secondary | ICD-10-CM | POA: Diagnosis not present

## 2023-05-22 DIAGNOSIS — R079 Chest pain, unspecified: Secondary | ICD-10-CM | POA: Diagnosis not present

## 2023-05-22 DIAGNOSIS — R112 Nausea with vomiting, unspecified: Secondary | ICD-10-CM | POA: Diagnosis present

## 2023-05-22 LAB — CBC
HCT: 37.6 % (ref 36.0–46.0)
Hemoglobin: 12.1 g/dL (ref 12.0–15.0)
MCH: 28.7 pg (ref 26.0–34.0)
MCHC: 32.2 g/dL (ref 30.0–36.0)
MCV: 89.1 fL (ref 80.0–100.0)
Platelets: 232 10*3/uL (ref 150–400)
RBC: 4.22 MIL/uL (ref 3.87–5.11)
RDW: 12.4 % (ref 11.5–15.5)
WBC: 10.3 10*3/uL (ref 4.0–10.5)
nRBC: 0 % (ref 0.0–0.2)

## 2023-05-22 LAB — COMPREHENSIVE METABOLIC PANEL
ALT: 24 U/L (ref 0–44)
AST: 23 U/L (ref 15–41)
Albumin: 2.9 g/dL — ABNORMAL LOW (ref 3.5–5.0)
Alkaline Phosphatase: 98 U/L (ref 38–126)
Anion gap: 10 (ref 5–15)
BUN: 17 mg/dL (ref 6–20)
CO2: 18 mmol/L — ABNORMAL LOW (ref 22–32)
Calcium: 8.1 mg/dL — ABNORMAL LOW (ref 8.9–10.3)
Chloride: 105 mmol/L (ref 98–111)
Creatinine, Ser: 1.17 mg/dL — ABNORMAL HIGH (ref 0.44–1.00)
GFR, Estimated: 59 mL/min — ABNORMAL LOW (ref >60)
Glucose, Bld: 334 mg/dL — ABNORMAL HIGH (ref 70–99)
Potassium: 4.9 mmol/L (ref 3.5–5.1)
Sodium: 133 mmol/L — ABNORMAL LOW (ref 135–145)
Total Bilirubin: 1 mg/dL (ref 0.3–1.2)
Total Protein: 6.8 g/dL (ref 6.5–8.1)

## 2023-05-22 LAB — I-STAT BETA HCG BLOOD, ED (MC, WL, AP ONLY): I-stat hCG, quantitative: 5.6 m[IU]/mL — ABNORMAL HIGH (ref <5)

## 2023-05-22 LAB — TROPONIN I (HIGH SENSITIVITY): Troponin I (High Sensitivity): 6 ng/L (ref <18)

## 2023-05-22 LAB — CBG MONITORING, ED: Glucose-Capillary: 313 mg/dL — ABNORMAL HIGH (ref 70–99)

## 2023-05-22 LAB — LACTIC ACID, PLASMA: Lactic Acid, Venous: 2.1 mmol/L (ref 0.5–1.9)

## 2023-05-22 MED ORDER — HYDROMORPHONE HCL 1 MG/ML IJ SOLN
1.0000 mg | Freq: Once | INTRAMUSCULAR | Status: AC
  Administered 2023-05-23: 1 mg via INTRAVENOUS
  Filled 2023-05-22: qty 1

## 2023-05-22 MED ORDER — METOCLOPRAMIDE HCL 5 MG/ML IJ SOLN
10.0000 mg | Freq: Once | INTRAMUSCULAR | Status: AC
  Administered 2023-05-23: 10 mg via INTRAVENOUS
  Filled 2023-05-22: qty 2

## 2023-05-22 MED ORDER — SODIUM CHLORIDE 0.9 % IV BOLUS
1000.0000 mL | Freq: Once | INTRAVENOUS | Status: AC
  Administered 2023-05-23: 1000 mL via INTRAVENOUS

## 2023-05-22 MED ORDER — LABETALOL HCL 5 MG/ML IV SOLN
5.0000 mg | Freq: Once | INTRAVENOUS | Status: AC
  Administered 2023-05-23: 5 mg via INTRAVENOUS
  Filled 2023-05-22: qty 4

## 2023-05-22 NOTE — ED Triage Notes (Signed)
Pt here via [facility] from home for emesis and chest pain. States L sided chest pain that radiates to L jaw and epigastric pain.  Pt also c/o episodes of lethargy and dizziness.  Diabetic, though unable to take insulin today.  CBG 357. BP 230/110.  EKG RBB with peaked T waves.  Given 400 LR 4 mg zofran 324 asa 1 sl nitro (that reduced the chest pain)

## 2023-05-22 NOTE — ED Provider Notes (Signed)
[place] EMERGENCY DEPARTMENT AT [facility] Provider Note   CSN: [id number] Arrival date & time: 05/22/23  2241     History {Add pertinent medical, surgical, social history, OB history to HPI:1} Chief Complaint  Patient presents with   Emesis   Chest Pain    Carnita Letia Guidry is a 47 y.o. female.  The history is provided by the patient and medical records.  Emesis Chest Pain Associated symptoms: vomiting   Keani Talayeh Bruinsma is a 47 y.o. female who presents to the Emergency Department complaining of chest pain and abdominal pain.  She presents to the emergency department by EMS for evaluation of chest pain and abdominal pain that started this afternoon.  She states that after the pain started she developed vomiting.  She has experienced some diarrhea since yesterday.  No associated fever. Pain radiates to her left jaw.     Home Medications Prior to Admission medications   Medication Sig Start Date End Date Taking? Authorizing Provider  acetaminophen (TYLENOL) 500 MG tablet Take 1,000 mg by mouth daily as needed for mild pain or headache.    [provider]  albuterol (VENTOLIN HFA) 108 (90 Base) MCG/ACT inhaler Inhale 2 puffs every 4 (four) hours as needed into the lungs for wheezing or shortness of breath.    [provider]  ALPRAZolam Prudy Feeler) 1 MG tablet Take 1 mg by mouth 3 (three) times daily. 02/26/20   [provider]  amphetamine-dextroamphetamine (ADDERALL) 20 MG tablet Take 20 mg by mouth 3 (three) times daily. 02/26/20   [provider]  amphetamine-dextroamphetamine (ADDERALL) 30 MG tablet Take 1 tablet by mouth 2 (two) times daily. 05/06/23     Blood Glucose Monitoring Suppl DEVI 1 each by Does not apply route in the morning, at noon, and at bedtime. May substitute to any manufacturer covered by patient's insurance. 03/05/23   Lanae Boast, MD  FLUoxetine (PROZAC) 40 MG capsule Take 40 mg by mouth daily. 12/21/19    [provider]  fluticasone (FLONASE) 50 MCG/ACT nasal spray Place 2 sprays into both nostrils daily. 03/06/23 04/05/23  Lanae Boast, MD  insulin detemir (LEVEMIR) 100 UNIT/ML FlexPen Inject 12 Units into the skin daily. 04/19/23 06/18/23  Mannie Stabile, PA-C  loratadine (CLARITIN) 10 MG tablet Take 1 tablet (10 mg total) by mouth daily. 03/06/23 04/05/23  Lanae Boast, MD  metFORMIN (GLUCOPHAGE) 1000 MG tablet Take 0.5 tablets (500 mg total) by mouth 2 (two) times daily with a meal for 20 days. Patient taking differently: Take 500 mg by mouth daily. 06/25/21 02/29/24  Caccavale, Sophia, PA-C  mometasone-formoterol (DULERA) 100-5 MCG/ACT AERO Inhale 2 puffs into the lungs 2 (two) times daily. 03/05/23 04/04/23  Lanae Boast, MD  montelukast (SINGULAIR) 10 MG tablet Take 1 tablet (10 mg total) by mouth at bedtime. 03/05/23 04/04/23  Lanae Boast, MD      Allergies    Ace inhibitors, Banana, Lamictal [lamotrigine], Nsaids, and Topiramate    Review of Systems   Review of Systems  Cardiovascular:  Positive for chest pain.  Gastrointestinal:  Positive for vomiting.  All other systems reviewed and are negative.   Physical Exam Updated Vital Signs BP (!) 244/122 (BP Location: Right Arm)   Temp 97.9 F (36.6 C) (Oral)   Resp 14   Ht 5\' 1"  (1.549 m)   Wt 79.4 kg   SpO2 100%   BMI 33.07 kg/m  Physical Exam Vitals and nursing note reviewed.  Constitutional:  General: She is in acute distress.     Appearance: She is well-developed. She is ill-appearing.  HENT:     Head: Normocephalic and atraumatic.  Cardiovascular:     Rate and Rhythm: Regular rhythm. Tachycardia present.     Heart sounds: No murmur heard. Pulmonary:     Effort: Pulmonary effort is normal. No respiratory distress.     Breath sounds: Normal breath sounds.  Abdominal:     Palpations: Abdomen is soft.     Tenderness: There is no abdominal tenderness. There is no guarding or rebound.  Musculoskeletal:        General:  No tenderness.     Comments: 2+ DP pulses  Skin:    General: Skin is warm and dry.  Neurological:     Mental Status: She is alert and oriented to person, place, and time.  Psychiatric:        Behavior: Behavior normal.     ED Results / Procedures / Treatments   Labs (all labs ordered are listed, but only abnormal results are displayed) Labs Reviewed  CBG MONITORING, ED - Abnormal; Notable for the following components:      Result Value   Glucose-Capillary 313 (*)    All other components within normal limits  CBC  COMPREHENSIVE METABOLIC PANEL  LACTIC ACID, PLASMA  LACTIC ACID, PLASMA  I-STAT BETA HCG BLOOD, ED ([facility], [facility], [facility] ONLY)  CBG MONITORING, ED  TROPONIN I (HIGH SENSITIVITY)    EKG None  Radiology No results found.  Procedures Procedures  {Document cardiac monitor, telemetry assessment procedure when appropriate:1}  Medications Ordered in ED Medications - No data to display  ED Course/ Medical Decision Making/ A&P   {   Click here for ABCD2, HEART and other calculatorsREFRESH Note before signing :1}                          Medical Decision Making Amount and/or Complexity of Data Reviewed Labs: ordered. Radiology: ordered.  Risk Prescription drug management.   ***  {Document critical care time when appropriate:1} {Document review of labs and clinical decision tools ie heart score, Chads2Vasc2 etc:1}  {Document your independent review of radiology images, and any outside records:1} {Document your discussion with family members, caretakers, and with consultants:1} {Document social determinants of health affecting pt's care:1} {Document your decision making why or why not admission, treatments were needed:1} Final Clinical Impression(s) / ED Diagnoses Final diagnoses:  None    Rx / DC Orders ED Discharge Orders     None

## 2023-05-23 ENCOUNTER — Observation Stay (HOSPITAL_COMMUNITY): Payer: Medicaid Other

## 2023-05-23 ENCOUNTER — Emergency Department (HOSPITAL_COMMUNITY): Payer: Medicaid Other

## 2023-05-23 ENCOUNTER — Ambulatory Visit (HOSPITAL_COMMUNITY): Payer: Medicaid Other

## 2023-05-23 ENCOUNTER — Other Ambulatory Visit: Payer: Self-pay

## 2023-05-23 DIAGNOSIS — Z0389 Encounter for observation for other suspected diseases and conditions ruled out: Secondary | ICD-10-CM | POA: Diagnosis not present

## 2023-05-23 DIAGNOSIS — R569 Unspecified convulsions: Secondary | ICD-10-CM | POA: Diagnosis not present

## 2023-05-23 DIAGNOSIS — R29818 Other symptoms and signs involving the nervous system: Secondary | ICD-10-CM | POA: Diagnosis not present

## 2023-05-23 DIAGNOSIS — M314 Aortic arch syndrome [Takayasu]: Secondary | ICD-10-CM | POA: Diagnosis not present

## 2023-05-23 DIAGNOSIS — R42 Dizziness and giddiness: Secondary | ICD-10-CM | POA: Diagnosis not present

## 2023-05-23 DIAGNOSIS — I6302 Cerebral infarction due to thrombosis of basilar artery: Secondary | ICD-10-CM

## 2023-05-23 DIAGNOSIS — I6389 Other cerebral infarction: Secondary | ICD-10-CM

## 2023-05-23 LAB — ECHOCARDIOGRAM COMPLETE
Area-P 1/2: 3.93 cm2
Height: 61 in
S' Lateral: 2.7 cm
Weight: 2800.72 oz

## 2023-05-23 LAB — CBC
HCT: 36.6 % (ref 36.0–46.0)
Hemoglobin: 11.9 g/dL — ABNORMAL LOW (ref 12.0–15.0)
MCH: 28.7 pg (ref 26.0–34.0)
MCHC: 32.5 g/dL (ref 30.0–36.0)
MCV: 88.4 fL (ref 80.0–100.0)
Platelets: 245 10*3/uL (ref 150–400)
RBC: 4.14 MIL/uL (ref 3.87–5.11)
RDW: 12.4 % (ref 11.5–15.5)
WBC: 12.7 10*3/uL — ABNORMAL HIGH (ref 4.0–10.5)
nRBC: 0 % (ref 0.0–0.2)

## 2023-05-23 LAB — HEMOGLOBIN A1C
Hgb A1c MFr Bld: 11 % — ABNORMAL HIGH (ref 4.8–5.6)
Mean Plasma Glucose: 269 mg/dL

## 2023-05-23 LAB — LIPID PANEL
Cholesterol: 181 mg/dL (ref 0–200)
HDL: 81 mg/dL (ref >40)
LDL Cholesterol: 86 mg/dL (ref 0–99)
Total CHOL/HDL Ratio: 2.2 RATIO
Triglycerides: 71 mg/dL (ref <150)
VLDL: 14 mg/dL (ref 0–40)

## 2023-05-23 LAB — I-STAT VENOUS BLOOD GAS, ED
Acid-base deficit: 4 mmol/L — ABNORMAL HIGH (ref 0.0–2.0)
Bicarbonate: 20.5 mmol/L (ref 20.0–28.0)
Calcium, Ion: 0.84 mmol/L — CL (ref 1.15–1.40)
HCT: 40 % (ref 36.0–46.0)
Hemoglobin: 13.6 g/dL (ref 12.0–15.0)
O2 Saturation: 94 %
Potassium: >8.5 mmol/L (ref 3.5–5.1)
Sodium: 125 mmol/L — ABNORMAL LOW (ref 135–145)
TCO2: 22 mmol/L (ref 22–32)
pCO2, Ven: 33.6 mmHg — ABNORMAL LOW (ref 44–60)
pH, Ven: 7.394 (ref 7.25–7.43)
pO2, Ven: 71 mmHg — ABNORMAL HIGH (ref 32–45)

## 2023-05-23 LAB — BASIC METABOLIC PANEL
Anion gap: 12 (ref 5–15)
BUN: 18 mg/dL (ref 6–20)
CO2: 16 mmol/L — ABNORMAL LOW (ref 22–32)
Calcium: 7.9 mg/dL — ABNORMAL LOW (ref 8.9–10.3)
Chloride: 103 mmol/L (ref 98–111)
Creatinine, Ser: 1.26 mg/dL — ABNORMAL HIGH (ref 0.44–1.00)
GFR, Estimated: 54 mL/min — ABNORMAL LOW (ref >60)
Glucose, Bld: 343 mg/dL — ABNORMAL HIGH (ref 70–99)
Potassium: 5.4 mmol/L — ABNORMAL HIGH (ref 3.5–5.1)
Sodium: 131 mmol/L — ABNORMAL LOW (ref 135–145)

## 2023-05-23 LAB — RAPID URINE DRUG SCREEN, HOSP PERFORMED
Amphetamines: POSITIVE — AB
Barbiturates: NOT DETECTED
Benzodiazepines: NOT DETECTED
Cocaine: NOT DETECTED
Opiates: NOT DETECTED
Tetrahydrocannabinol: NOT DETECTED

## 2023-05-23 LAB — GLUCOSE, CAPILLARY: Glucose-Capillary: 262 mg/dL — ABNORMAL HIGH (ref 70–99)

## 2023-05-23 LAB — CBG MONITORING, ED
Glucose-Capillary: 296 mg/dL — ABNORMAL HIGH (ref 70–99)
Glucose-Capillary: 333 mg/dL — ABNORMAL HIGH (ref 70–99)
Glucose-Capillary: 343 mg/dL — ABNORMAL HIGH (ref 70–99)
Glucose-Capillary: 317 mg/dL — ABNORMAL HIGH (ref 70–99)
Glucose-Capillary: 193 mg/dL — ABNORMAL HIGH (ref 70–99)
Glucose-Capillary: 95 mg/dL (ref 70–99)

## 2023-05-23 LAB — LIPASE, BLOOD: Lipase: 25 U/L (ref 11–51)

## 2023-05-23 LAB — LACTIC ACID, PLASMA
Lactic Acid, Venous: 1.8 mmol/L (ref 0.5–1.9)
Lactic Acid, Venous: 2.3 mmol/L (ref 0.5–1.9)
Lactic Acid, Venous: 2.2 mmol/L (ref 0.5–1.9)

## 2023-05-23 LAB — TROPONIN I (HIGH SENSITIVITY): Troponin I (High Sensitivity): 6 ng/L (ref <18)

## 2023-05-23 MED ORDER — PNEUMOCOCCAL 20-VAL CONJ VACC 0.5 ML IM SUSY
0.5000 mL | PREFILLED_SYRINGE | INTRAMUSCULAR | Status: DC
  Filled 2023-05-23: qty 0.5

## 2023-05-23 MED ORDER — IOHEXOL 350 MG/ML SOLN
75.0000 mL | Freq: Once | INTRAVENOUS | Status: AC | PRN
  Administered 2023-05-23: 75 mL via INTRAVENOUS

## 2023-05-23 MED ORDER — CLOPIDOGREL BISULFATE 300 MG PO TABS
300.0000 mg | ORAL_TABLET | Freq: Once | ORAL | Status: AC
  Administered 2023-05-23: 300 mg via ORAL
  Filled 2023-05-23: qty 1

## 2023-05-23 MED ORDER — MELATONIN 5 MG PO TABS: 5.0000 mg | ORAL_TABLET | Freq: Every evening | ORAL | Status: DC | PRN

## 2023-05-23 MED ORDER — INSULIN ASPART 100 UNIT/ML IJ SOLN: 0.0000 [IU] | Freq: Every day | INTRAMUSCULAR | Status: DC

## 2023-05-23 MED ORDER — INSULIN ASPART 100 UNIT/ML IJ SOLN
0.0000 [IU] | INTRAMUSCULAR | Status: DC
  Administered 2023-05-23: 3 [IU] via SUBCUTANEOUS
  Administered 2023-05-23: 8 [IU] via SUBCUTANEOUS
  Administered 2023-05-24: 11 [IU] via SUBCUTANEOUS
  Administered 2023-05-24: 3 [IU] via SUBCUTANEOUS

## 2023-05-23 MED ORDER — PROCHLORPERAZINE EDISYLATE 10 MG/2ML IJ SOLN
5.0000 mg | Freq: Four times a day (QID) | INTRAMUSCULAR | Status: DC | PRN
  Administered 2023-05-23 (×2): 5 mg via INTRAVENOUS
  Filled 2023-05-23 (×2): qty 2

## 2023-05-23 MED ORDER — BOOST / RESOURCE BREEZE PO LIQD CUSTOM: 1.0000 | Freq: Three times a day (TID) | ORAL | Status: DC

## 2023-05-23 MED ORDER — LABETALOL HCL 5 MG/ML IV SOLN
10.0000 mg | INTRAVENOUS | Status: DC | PRN
  Administered 2023-05-23 (×2): 10 mg via INTRAVENOUS
  Filled 2023-05-23: qty 4

## 2023-05-23 MED ORDER — LABETALOL HCL 5 MG/ML IV SOLN: 5.0000 mg | INTRAVENOUS | Status: DC | PRN

## 2023-05-23 MED ORDER — ACETAMINOPHEN 325 MG PO TABS
650.0000 mg | ORAL_TABLET | Freq: Four times a day (QID) | ORAL | Status: DC | PRN
  Administered 2023-05-24 – 2023-05-25 (×2): 650 mg via ORAL
  Filled 2023-05-23 (×2): qty 2

## 2023-05-23 MED ORDER — INSULIN GLARGINE-YFGN 100 UNIT/ML ~~LOC~~ SOLN
5.0000 [IU] | Freq: Every day | SUBCUTANEOUS | Status: DC
  Administered 2023-05-23 – 2023-05-24 (×2): 5 [IU] via SUBCUTANEOUS
  Filled 2023-05-23 (×2): qty 0.05

## 2023-05-23 MED ORDER — ASPIRIN 81 MG PO TBEC
81.0000 mg | DELAYED_RELEASE_TABLET | Freq: Every day | ORAL | Status: DC
  Administered 2023-05-23 – 2023-05-25 (×3): 81 mg via ORAL
  Filled 2023-05-23 (×4): qty 1

## 2023-05-23 MED ORDER — INSULIN ASPART 100 UNIT/ML IJ SOLN
0.0000 [IU] | Freq: Three times a day (TID) | INTRAMUSCULAR | Status: DC
  Administered 2023-05-23: 7 [IU] via SUBCUTANEOUS

## 2023-05-23 MED ORDER — IOHEXOL 350 MG/ML SOLN
75.0000 mL | Freq: Once | INTRAVENOUS | Status: AC | PRN
  Administered 2023-05-23: 60 mL via INTRAVENOUS

## 2023-05-23 MED ORDER — POLYETHYLENE GLYCOL 3350 17 G PO PACK: 17.0000 g | PACK | Freq: Every day | ORAL | Status: DC | PRN

## 2023-05-23 MED ORDER — PANTOPRAZOLE SODIUM 40 MG IV SOLR
40.0000 mg | Freq: Two times a day (BID) | INTRAVENOUS | Status: DC
  Administered 2023-05-23 – 2023-05-25 (×5): 40 mg via INTRAVENOUS
  Filled 2023-05-23 (×5): qty 10

## 2023-05-23 MED ORDER — LORAZEPAM 2 MG/ML IJ SOLN
0.5000 mg | Freq: Once | INTRAMUSCULAR | Status: AC
  Administered 2023-05-23: 0.5 mg via INTRAVENOUS
  Filled 2023-05-23: qty 1

## 2023-05-23 MED ORDER — SODIUM CHLORIDE 0.9 % IV SOLN: INTRAVENOUS | Status: AC

## 2023-05-23 MED ORDER — CLOPIDOGREL BISULFATE 75 MG PO TABS
75.0000 mg | ORAL_TABLET | Freq: Every day | ORAL | Status: DC
  Administered 2023-05-24 – 2023-05-25 (×2): 75 mg via ORAL
  Filled 2023-05-23 (×2): qty 1

## 2023-05-23 MED ORDER — ONDANSETRON HCL 4 MG/2ML IJ SOLN
4.0000 mg | Freq: Once | INTRAMUSCULAR | Status: AC
  Administered 2023-05-23: 4 mg via INTRAVENOUS
  Filled 2023-05-23: qty 2

## 2023-05-23 MED ORDER — ENOXAPARIN SODIUM 40 MG/0.4ML IJ SOSY
40.0000 mg | PREFILLED_SYRINGE | INTRAMUSCULAR | Status: DC
  Administered 2023-05-23 – 2023-05-24 (×2): 40 mg via SUBCUTANEOUS
  Filled 2023-05-23 (×2): qty 0.4

## 2023-05-23 MED ORDER — ROSUVASTATIN CALCIUM 20 MG PO TABS
20.0000 mg | ORAL_TABLET | Freq: Every day | ORAL | Status: DC
  Administered 2023-05-23 – 2023-05-25 (×3): 20 mg via ORAL
  Filled 2023-05-23 (×3): qty 1

## 2023-05-23 NOTE — ED Notes (Signed)
EEG at bedside.

## 2023-05-23 NOTE — Evaluation (Signed)
Physical Therapy Evaluation Patient Details Name: Virginia [name] MRN: [id number] DOB: 09/05/1976 Today's Date: 05/23/2023  History of Present Illness  47 y.o. female presents to [facility] hospital on 05/22/2023 with nausea, vomiting, chest pain, and dizziness. CT negative, awaiting MRI. PMH includes HTN, DMII, bipolar affective, depression.  Clinical Impression  Pt presents to PT with deficits in functional mobility, balance, arousal, cognition, endurance. Pt reports significant dizziness when sitting, difficult to assess vestibular function due to intermittent lethargy and pt's inability to consistently reports symptoms, open eyes, or follow multi-step commands. Pt refuses attempts at transfers or ambulation at this time. PT will attempt to follow up to further assess mobility and vestibular function. Of note pt did have ativan earlier in the morning, possibly contributing to lethargy, although pt's significant other reports the pt has been more alert currently than when presenting to hospital.     Recommendations for follow up therapy are one component of a multi-disciplinary discharge planning process, led by the attending physician.  Recommendations may be updated based on patient status, additional functional criteria and insurance authorization.  Follow Up Recommendations       Assistance Recommended at Discharge Frequent or constant Supervision/Assistance  Patient can return home with the following  A lot of help with walking and/or transfers;A lot of help with bathing/dressing/bathroom;Assistance with cooking/housework;Direct supervision/assist for medications management;Direct supervision/assist for financial management;Assist for transportation;Help with stairs or ramp for entrance    Equipment Recommendations  (TBD pending progress)  Recommendations for Other Services       Functional Status Assessment Patient has had a recent decline in their functional status and demonstrates the  ability to make significant improvements in function in a reasonable and predictable amount of time.     Precautions / Restrictions Precautions Precautions: Fall Precaution Comments: dizziness Restrictions Weight Bearing Restrictions: No      Mobility  Bed Mobility Overal bed mobility: Needs Assistance Bed Mobility: Supine to Sit, Sit to Supine     Supine to sit: Min assist, HOB elevated Sit to supine: Min guard        Transfers Overall transfer level:  (pt refuses attempts due to reports of dizziness and nausea)                      Ambulation/Gait                  Stairs            Wheelchair Mobility    Modified Rankin (Stroke Patients Only)       Balance Overall balance assessment: Needs assistance Sitting-balance support: No upper extremity supported, Feet unsupported Sitting balance-Leahy Scale: Poor Sitting balance - Comments: minA, pt with imbalance anteriorly and laterally in both directions Postural control: Left lateral lean, Right lateral lean                                   Pertinent Vitals/Pain Pain Assessment Pain Assessment: Faces Faces Pain Scale: Hurts little more Pain Location: abdomen Pain Descriptors / Indicators: Aching Pain Intervention(s): Monitored during session    Home Living Family/patient expects to be discharged to:: Private residence Living Arrangements: Spouse/significant other Available Help at Discharge: Family;Available PRN/intermittently Type of Home: House Home Access: Stairs to enter Entrance Stairs-Rails: None Entrance Stairs-Number of Steps: 3   Home Layout: Two level;Able to live on main level with bedroom/bathroom Home Equipment: None  Prior Function Prior Level of Function : Independent/Modified Independent             Mobility Comments: ambulatory without DME       Hand Dominance        Extremity/Trunk Assessment   Upper Extremity Assessment Upper  Extremity Assessment: Defer to OT evaluation    Lower Extremity Assessment Lower Extremity Assessment: Difficult to assess due to impaired cognition;Generalized weakness    Cervical / Trunk Assessment Cervical / Trunk Assessment: Normal  Communication   Communication: No difficulties  Cognition Arousal/Alertness: Lethargic (ativan at 830) Behavior During Therapy: Flat affect, Impulsive Overall Cognitive Status: Difficult to assess                                          General Comments General comments (skin integrity, edema, etc.): VSS on RA, unable to perform full vestibular assessment as pt is unable to remain alert and maintain eyes open, as well as follow multi-step commands. Pt with R gaze deviation in sitting, reporting dizziness, no definitive nystagmus seen. Unable to reliably assess pursuits as pt minimallr opens R eye and half opens L eye. Pt dry heaves twice during session after sitting at the edge of bed.    Exercises     Assessment/Plan    PT Assessment Patient needs continued PT services  PT Problem List Decreased strength;Decreased activity tolerance;Decreased balance;Decreased mobility;Decreased cognition;Decreased knowledge of use of DME;Decreased safety awareness;Decreased knowledge of precautions       PT Treatment Interventions DME instruction;Gait training;Functional mobility training;Therapeutic activities;Therapeutic exercise;Stair training;Balance training;Neuromuscular re-education;Cognitive remediation;Patient/family education    PT Goals (Current goals can be found in the Care Plan section)  Acute Rehab PT Goals Patient Stated Goal: to stop dizziness PT Goal Formulation: With patient Time For Goal Achievement: 06/06/23 Potential to Achieve Goals: Fair    Frequency Min 3X/week     Co-evaluation PT/OT/SLP Co-Evaluation/Treatment: Yes Reason for Co-Treatment: Complexity of the patient's impairments (multi-system  involvement);Necessary to address cognition/behavior during functional activity;For patient/therapist safety;To address functional/ADL transfers PT goals addressed during session: Mobility/safety with mobility;Balance;Strengthening/ROM         AM-PAC PT "6 Clicks" Mobility  Outcome Measure Help needed turning from your back to your side while in a flat bed without using bedrails?: A Little Help needed moving from lying on your back to sitting on the side of a flat bed without using bedrails?: A Little Help needed moving to and from a bed to a chair (including a wheelchair)?: Total Help needed standing up from a chair using your arms (e.g., wheelchair or bedside chair)?: Total Help needed to walk in hospital room?: Total Help needed climbing 3-5 steps with a railing? : Total 6 Click Score: 10    End of Session   Activity Tolerance: Patient limited by fatigue;Patient limited by lethargy Patient left: in bed;with call bell/phone within reach;with family/visitor present Nurse Communication: Mobility status PT Visit Diagnosis: Other abnormalities of gait and mobility (R26.89);Muscle weakness (generalized) (M62.81);Other symptoms and signs involving the nervous system (R29.898) (history of vertigo per pt report)    Time: 0981-1914 PT Time Calculation (min) (ACUTE ONLY): 23 min   Charges:   PT Evaluation $PT Eval Low Complexity: 1 Low          Arlyss Gandy, PT, DPT Acute Rehabilitation Office [phone]   Arlyss Gandy 05/23/2023, 11:32 AM

## 2023-05-23 NOTE — Progress Notes (Signed)
[facility] PROGRESS NOTE   Virginia [name] ZOX:[id number] DOB: 10-07-1976 DOA: 05/22/2023  PCP: Harvest Forest, MD  Brief History/Interval Summary: 47 y.o. female with medical history significant for hypertension, type 2 diabetes, who presented to the ED from home via EMS due to nausea vomiting chest pain and dizziness.  Describes her chest pain as sharp left-sided radiating to her left jaw.  Also endorses epigastric pain associated with nausea vomiting, and dizziness that started abruptly around noon.  She was also complaining of diplopia.  There was concern the patient may have had a neurological event such as TIA or stroke.  She was hospitalized for further management.  Consultants: Neurology  Procedures: Echocardiogram is pending    Subjective/Interval History: Patient complains of headache abdominal pain nausea and vomiting this morning.  According to nursing staff she had a brief episode of unresponsiveness as well.  Blood pressures were noted to be greater than 220 systolic.    Assessment/Plan:  Diplopia/dizziness Stroke needs to be ruled out.  MRI is pending.  Echocardiogram has been ordered.'s patient seen by neurology.  Currently on aspirin and Plavix. PT OT evaluation. LDL noted to be 86.  Patient was not on statin prior to admission.  HbA1c is 11.0. Considering her episode of unresponsiveness this morning we will order EEG.  Intractable nausea and vomiting LFTs and lipase levels were normal.  CT scan of the abdomen pelvis along with chest did not show any acute findings.  She has had since gastric bypass time cholecystectomy previously.  She is tender in the epigastric area.  Will place her on PPI.  N.p.o. for now.  Chronic kidney disease stage IIIa Renal function seems to be close to baseline.  Hyperkalemia is noted but sample is hemolyzed.  Will recheck labs tomorrow.  Monitor urine output.  Avoid nephrotoxic agent.  Essential hypertension Permissive  hypertension was being allowed but blood pressure was noted to be greater than 220 this morning.  As above her symptoms of headache and nausea vomiting could be as a result of high blood pressure.  Labetalol has been ordered.  Continue to monitor closely.  Chest pain Probably GI in origin.  EKG without any ischemic changes.  Troponins were normal.  Continue to monitor for now.  Diabetes mellitus type 2, uncontrolled with hyperglycemia HbA1c is 11.0.  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  Currently on SSI.  Prior to admission she was noted to be on Levemir.  Will start long-acting agents.  She was also on metformin which will be held.  Obesity Estimated body mass index is 33.07 kg/m as calculated from the following:   Height as of this encounter: 5\' 1"  (1.549 m).   Weight as of this encounter: 79.4 kg.  DVT Prophylaxis: Lovenox Code Status: Full code Family Communication: Discussed with patient Disposition Plan: To be determined  Status is: Observation The patient will require care spanning > 2 midnights and should be moved to inpatient because: Nausea vomiting, dizziness, concern for TIA versus stroke.      Medications: Scheduled:  [START ON 05/24/2023] clopidogrel  75 mg Oral Daily   enoxaparin (LOVENOX) injection  40 mg Subcutaneous Q24H   insulin aspart  0-5 Units Subcutaneous QHS   insulin aspart  0-9 Units Subcutaneous TID WC   pantoprazole (PROTONIX) IV  40 mg Intravenous Q12H   Continuous:  sodium chloride 50 mL/hr at 05/23/23 0740   WJX:BJYNWGNFAOZHY, labetalol, melatonin, polyethylene glycol, prochlorperazine  Antibiotics: Anti-infectives (From admission, onward)  None       Objective:  Vital Signs  Vitals:   05/23/23 0319 05/23/23 0650 05/23/23 0715 05/23/23 0800  BP:  (!) 231/91 (!) 217/111 (!) 117/91  Pulse:  72 89 70  Resp:  20 (!) 27 14  Temp: 98.3 F (36.8 C)     TempSrc:      SpO2:  100% 100% 100%  Weight:      Height:       No intake  or output data in the 24 hours ending 05/23/23 0903 Filed Weights   05/22/23 2253  Weight: 79.4 kg    General appearance: Awake alert.  In no distress Resp: Clear to auscultation bilaterally.  Normal effort Cardio: S1-S2 is normal regular.  No S3-S4.  No rubs murmurs or bruit GI: Abdomen is soft.  Tender diffusely but mainly in the epigastric area without any rebound radio guarding.  No masses organomegaly. Extremities: No edema.  Full range of motion of lower extremities. Neurologic: No obvious focal neurological deficits noted.   Lab Results:  Data Reviewed: I have personally reviewed following labs and reports of the imaging studies  CBC: Recent Labs  Lab 05/22/23 2300 05/23/23 0005 05/23/23 0635  WBC 10.3  --  12.7*  HGB 12.1 13.6 11.9*  HCT 37.6 40.0 36.6  MCV 89.1  --  88.4  PLT 232  --  245    Basic Metabolic Panel: Recent Labs  Lab 05/22/23 2300 05/23/23 0005 05/23/23 0635  NA 133* 125* 131*  K 4.9 >8.5* 5.4*  CL 105  --  103  CO2 18*  --  16*  GLUCOSE 334*  --  343*  BUN 17  --  18  CREATININE 1.17*  --  1.26*  CALCIUM 8.1*  --  7.9*    GFR: Estimated Creatinine Clearance: 53.8 mL/min (A) (by C-G formula based on SCr of 1.26 mg/dL (H)).  Liver Function Tests: Recent Labs  Lab 05/22/23 2300  AST 23  ALT 24  ALKPHOS 98  BILITOT 1.0  PROT 6.8  ALBUMIN 2.9*    Recent Labs  Lab 05/22/23 2300  LIPASE 25    HbA1C: Recent Labs    05/23/23 0635  HGBA1C 11.0*    CBG: Recent Labs  Lab 05/22/23 2253 05/23/23 0109 05/23/23 0540 05/23/23 0645 05/23/23 0821  GLUCAP 313* 296* 333* 343* 317*    Lipid Profile: Recent Labs    05/23/23 0635  CHOL 181  HDL 81  LDLCALC 86  TRIG 71  CHOLHDL 2.2    Radiology Studies: CT ANGIO HEAD NECK W WO CM  Result Date: 05/23/2023 CLINICAL DATA:  Initial evaluation for mental status change. Concern for stroke. EXAM: CT ANGIOGRAPHY HEAD AND NECK WITH AND WITHOUT CONTRAST TECHNIQUE: Multidetector  CT imaging of the head and neck was performed using the standard protocol during bolus administration of intravenous contrast. Multiplanar CT image reconstructions and MIPs were obtained to evaluate the vascular anatomy. Carotid stenosis measurements (when applicable) are obtained utilizing NASCET criteria, using the distal internal carotid diameter as the denominator. RADIATION DOSE REDUCTION: This exam was performed according to the departmental dose-optimization program which includes automated exposure control, adjustment of the mA and/or kV according to patient size and/or use of iterative reconstruction technique. CONTRAST:  60mL OMNIPAQUE IOHEXOL 350 MG/ML SOLN COMPARISON:  Prior study from 04/18/2018. FINDINGS: CT HEAD FINDINGS Brain: Some contrast material remains on board from CTA performed earlier the same day. Cerebral volume within normal limits for patient age. No evidence  for acute intracranial hemorrhage. No findings to suggest acute large vessel territory infarct. No mass lesion, midline shift, or mass effect. Ventricles are normal in size without evidence for hydrocephalus. No extra-axial fluid collection identified. Vascular: Contrast material seen throughout the intracranial vasculature. Skull: Scalp soft tissues demonstrate no acute abnormality. Calvarium intact. Sinuses/Orbits: Globes and orbital soft tissues within normal limits. Visualized paranasal sinuses are clear. No mastoid effusion. CTA NECK FINDINGS Aortic arch: Standard branching. Imaged portion shows no evidence of aneurysm or dissection. No significant stenosis of the major arch vessel origins. Right carotid system: No evidence of dissection, stenosis (50% or greater), or occlusion. Left carotid system: No evidence of dissection, stenosis (50% or greater), or occlusion. Vertebral arteries: Right vertebral artery dominant. Left vertebral artery hypoplastic and arises directly from the aortic arch. No dissection or stenosis. Skeleton:  No discrete or worrisome osseous lesions. Other neck: No other acute finding within the neck. Upper chest: No acute finding within the visualized upper chest. Review of the MIP images confirms the above findings CTA HEAD FINDINGS Anterior circulation: Both internal carotid arteries are widely patent through the siphons without stenosis or other abnormality. Right A1 segment widely patent. Left A1 hypoplastic and/or absent. Tiny 1-2 mm outpouching arising from the anterior communicating canning artery complex felt to be most consistent with a small vascular infundibulum, likely related to a hypoplastic left A1 segment (series 11, image 109). Both ACAs widely patent distally without stenosis. No M1 stenosis or occlusion. No proximal MCA branch occlusion or high-grade stenosis. Distal MCA branches perfused and symmetric. Posterior circulation: Dominant right V4 segment widely patent. Left V4 segment hypoplastic and patent as well. Both PICA patent at their origins. Basilar patent without stenosis. Superior cerebral arteries patent bilaterally. Right PCA supplied via the basilar. Fetal type origin of the left PCA. Both PCAs are widely patent to their distal aspects without stenosis. Venous sinuses: Patent allowing for timing the contrast bolus. Anatomic variants: As above.  No aneurysm. Review of the MIP images confirms the above findings IMPRESSION: 1. Normal CTA of the head and neck. No large vessel occlusion or other emergent finding. No hemodynamically significant or correctable stenosis. 2. No other acute intracranial abnormality. Electronically Signed   By: Rise Mu M.D.   On: 05/23/2023 03:42   CT Angio Chest/Abd/Pel for Dissection W and/or W/WO  Result Date: 05/23/2023 CLINICAL DATA:  Acute aortic syndrome EXAM: CT ANGIOGRAPHY CHEST, ABDOMEN AND PELVIS TECHNIQUE: Non-contrast CT of the chest was initially obtained. Multidetector CT imaging through the chest, abdomen and pelvis was performed using  the standard protocol during bolus administration of intravenous contrast. Multiplanar reconstructed images and MIPs were obtained and reviewed to evaluate the vascular anatomy. RADIATION DOSE REDUCTION: This exam was performed according to the departmental dose-optimization program which includes automated exposure control, adjustment of the mA and/or kV according to patient size and/or use of iterative reconstruction technique. CONTRAST:  75mL OMNIPAQUE IOHEXOL 350 MG/ML SOLN COMPARISON:  03/01/2023 FINDINGS: CTA CHEST FINDINGS Cardiovascular: Preferential opacification of the thoracic aorta. No evidence of thoracic aortic aneurysm or dissection. Normal heart size. No pericardial effusion. Mediastinum/Nodes: Visualized thyroid is unremarkable. The esophagus is patulous but is otherwise unremarkable. No pathologic adenopathy within the thorax. Lungs/Pleura: Lungs are clear. No pleural effusion or pneumothorax. Musculoskeletal: No acute bone abnormality. No lytic or blastic bone lesion. Review of the MIP images confirms the above findings. CTA ABDOMEN AND PELVIS FINDINGS VASCULAR Aorta: Normal caliber aorta without aneurysm, dissection, vasculitis or significant stenosis. Celiac: Patent  without evidence of aneurysm, dissection, vasculitis or significant stenosis. Note is made of an accessory left hepatic artery. SMA: Patent without evidence of aneurysm, dissection, vasculitis or significant stenosis. Note is made of an replaced right hepatic artery. Renals: Both renal arteries are patent without evidence of aneurysm, dissection, vasculitis, fibromuscular dysplasia or significant stenosis. IMA: Patent without evidence of aneurysm, dissection, vasculitis or significant stenosis. Inflow: Patent without evidence of aneurysm, dissection, vasculitis or significant stenosis. Veins: No obvious venous abnormality within the limitations of this arterial phase study. Review of the MIP images confirms the above findings.  NON-VASCULAR Hepatobiliary: No focal liver abnormality is seen. Status post cholecystectomy. No intrahepatic biliary ductal dilation. Mild extrahepatic biliary ductal dilation which tapers toward the ampulla is again identified likely representing post cholecystectomy change. Pancreas: Unremarkable Spleen: Unremarkable Adrenals/Urinary Tract: The adrenal glands are unremarkable. The kidneys are normal save for mild right cortical scarring. Small nondependent gas is seen within the bladder lumen which may relate to recent catheterization. The bladder is otherwise unremarkable. Stomach/Bowel: Surgical changes of gastric bypass are identified. The stomach, small bowel, and large bowel are otherwise unremarkable. No evidence of obstruction or focal inflammation. Appendix normal. No free intraperitoneal gas or fluid Lymphatic: No pathologic adenopathy within the abdomen and pelvis. Reproductive: Uterus and bilateral adnexa are unremarkable. Other: No no abdominal wall hernia Musculoskeletal: No acute bone abnormality. No lytic or blastic bone lesion. Review of the MIP images confirms the above findings. IMPRESSION: 1. No acute intrathoracic or intra-abdominal pathology identified. No thoracoabdominal aortic aneurysm or dissection. 2. Surgical changes of gastric bypass and cholecystectomy. Electronically Signed   By: Helyn Numbers M.D.   On: 05/23/2023 01:02   DG Chest Portable 1 View  Result Date: 05/22/2023 CLINICAL DATA:  Chest pain and vomiting. EXAM: PORTABLE CHEST 1 VIEW COMPARISON:  March 01, 2023 FINDINGS: The heart size and mediastinal contours are within normal limits. Both lungs are clear. The visualized skeletal structures are unremarkable. IMPRESSION: No active disease. Electronically Signed   By: Aram Candela M.D.   On: 05/22/2023 23:32       LOS: 0 days   Virginia [name]  [facility] Pager on [email or web address]  05/23/2023, 9:03 AM

## 2023-05-23 NOTE — ED Notes (Signed)
Pt to Vascular, well appearing upon transport.

## 2023-05-23 NOTE — ED Notes (Signed)
Pt back  to c-t  has been  comfortable since she received pain med  soon after her arrival

## 2023-05-23 NOTE — ED Notes (Signed)
To ct

## 2023-05-23 NOTE — Progress Notes (Signed)
EEG complete - results pending 

## 2023-05-23 NOTE — Progress Notes (Signed)
SLP Cancellation Note  Patient Details Name: Elani Delph MRN: [id number] DOB: 08-30-1976   Cancelled treatment:       Reason Eval/Treat Not Completed: Medical issues which prohibited therapy (Pt's case discussed with Dr. [name] who advised that the pt is NPO secondary to abdominal pain and n/v; he recommended that swallow eval be deferred today. SLP will follow up on subsequent date.)  Diona Peregoy I. Vear Clock, MS, CCC-SLP Neuro Diagnostic Specialist  Acute Rehabilitation Services Office number: [phone]  Scheryl Marten 05/23/2023, 10:55 AM

## 2023-05-23 NOTE — Evaluation (Signed)
Occupational Therapy Evaluation Patient Details Name: Virginia [name] MRN: [id number] DOB: 1976-06-29 Today's Date: 05/23/2023   History of Present Illness 47 y.o. female presents to [facility] hospital on 05/22/2023 with nausea, vomiting, chest pain, and dizziness. CT negative, awaiting MRI. PMH includes HTN, DMII, bipolar affective, depression.   Clinical Impression   Pt currently limited during OT/PT co-eval.  Increased report of dizziness and nausea with brief transition and sitting EOB.  Did not attempt any activity secondary to this and laid back down abruptly after sitting for less than 2 minutes.  Keeps eyes closed or 3/4 closed majority of the time.  When sitting up her eyes were kept with gaze to the right but unable to see any active nystagmus or do further testing.  Feel she will benefit from acute care OT at this time with follow-up HHOT at discharge.  Will update recommendations as she is able to actively participate more.        Recommendations for follow up therapy are one component of a multi-disciplinary discharge planning process, led by the attending physician.  Recommendations may be updated based on patient status, additional functional criteria and insurance authorization.   Assistance Recommended at Discharge Frequent or constant Supervision/Assistance  Patient can return home with the following A lot of help with walking and/or transfers;A lot of help with bathing/dressing/bathroom;Assistance with cooking/housework;Assist for transportation    Functional Status Assessment  Patient has had a recent decline in their functional status and demonstrates the ability to make significant improvements in function in a reasonable and predictable amount of time.  Equipment Recommendations  Other (comment) (TBD)       Precautions / Restrictions Precautions Precautions: Fall Precaution Comments: dizziness Restrictions Weight Bearing Restrictions: No      Mobility Bed  Mobility Overal bed mobility: Needs Assistance Bed Mobility: Supine to Sit, Sit to Supine     Supine to sit: Min guard Sit to supine: Min guard        Transfers                          Balance Overall balance assessment: Needs assistance Sitting-balance support: No upper extremity supported, Feet unsupported Sitting balance-Leahy Scale: Poor Sitting balance - Comments: Mod assist for sitting balance secondary to forward lean and LOB       Standing balance comment: unable to attempt                           ADL either performed or assessed with clinical judgement   ADL Overall ADL's : Needs assistance/impaired                                     Functional mobility during ADLs: Min guard (supine to sit and sit to supine) General ADL Comments: Limited eval as pt transitioned to EOB briefly, reports dizziness and nausea and then layed back down, even though cued to stay sitting up.  While sitting, mod assist needed for balance as pt would lean forward and therapist would have to stop her and help reposition.  Eyes were gazing right when opened in sitting but unable to test further things as she returned back to supine impulsively with some dry heaving noted.  Wil continue to assess further in treatment.     Vision   Additional Comments: Pt with eyes closed  majority of the time, opening them briefly in sitting and supine.  Tends to keep the right one closed more than left.  When eyes were open in sitting, she reported increased dizziness with gaze to the right.  Unable to assess vision secondary to nausea and inability to actively participate.  Will continue to assess in treatment.            Pertinent Vitals/Pain Pain Assessment Pain Assessment: Faces Faces Pain Scale: Hurts little more Pain Location: abdomen Pain Descriptors / Indicators: Aching Pain Intervention(s): Monitored during session, Repositioned        Extremity/Trunk  Assessment Upper Extremity Assessment Upper Extremity Assessment: Generalized weakness (difficult to accurately assess secondary to cognition)   Lower Extremity Assessment Lower Extremity Assessment: Difficult to assess due to impaired cognition;Generalized weakness   Cervical / Trunk Assessment Cervical / Trunk Assessment: Normal   Communication Communication Communication: No difficulties   Cognition Arousal/Alertness: Lethargic Behavior During Therapy: Flat affect, Impulsive (laying down impulsively secondary to nausea and dizziness)                                   General Comments: Pt kept eyes closed majority of session secondary to not feeling well.  Slow and lethargic responses to questions most of the time.     General Comments  VSS on RA, unable to perform full vestibular assessment as pt is unable to remain alert and maintain eyes open, as well as follow multi-step commands. Pt with R gaze deviation in sitting, reporting dizziness, no definitive nystagmus seen. Unable to reliably assess pursuits as pt minimallr opens R eye and half opens L eye. Pt dry heaves twice during session after sitting at the edge of bed.            Home Living Family/patient expects to be discharged to:: Private residence Living Arrangements: Spouse/significant other Available Help at Discharge: Family;Available PRN/intermittently Type of Home: House Home Access: Stairs to enter [employer] of Steps: 3 Entrance Stairs-Rails: None Home Layout: Two level;Able to live on main level with bedroom/bathroom     Bathroom Shower/Tub: Chief Strategy Officer: Standard     Home Equipment: None          Prior Functioning/Environment Prior Level of Function : Independent/Modified Independent             Mobility Comments: ambulatory without DME          OT Problem List: Decreased strength;Impaired balance (sitting and/or standing);Decreased  knowledge of use of DME or AE;Decreased activity tolerance;Other (comment) (dizziness)      OT Treatment/Interventions: Self-care/ADL training;Patient/family education;Balance training;Therapeutic activities;DME and/or AE instruction;Neuromuscular education    OT Goals(Current goals can be found in the care plan section) Acute Rehab OT Goals Patient Stated Goal: Pt unable to state OT Goal Formulation: With patient/family Time For Goal Achievement: 06/06/23 Potential to Achieve Goals: Fair  OT Frequency: Min 2X/week    Co-evaluation PT/OT/SLP Co-Evaluation/Treatment: Yes Reason for Co-Treatment: Complexity of the patient's impairments (multi-system involvement);Necessary to address cognition/behavior during functional activity;For patient/therapist safety;To address functional/ADL transfers PT goals addressed during session: Mobility/safety with mobility;Balance;Strengthening/ROM OT goals addressed during session: ADL's and self-care      AM-PAC OT "6 Clicks" Daily Activity     Outcome Measure Help from another person eating meals?: None Help from another person taking care of personal grooming?: A Little Help from another person toileting, which includes using  toliet, bedpan, or urinal?: A Lot Help from another person bathing (including washing, rinsing, drying)?: A Lot Help from another person to put on and taking off regular upper body clothing?: A Little Help from another person to put on and taking off regular lower body clothing?: A Lot 6 Click Score: 16   End of Session Nurse Communication: Other (comment) (inability to actively participate more than a brief sitting on the EOB.)  Activity Tolerance: Other (comment) (limited secondary to nausea and dizziness) Patient left: in bed  OT Visit Diagnosis: Unsteadiness on feet (R26.81);Dizziness and giddiness (R42)                Time: 1610-9604 OT Time Calculation (min): 18 min Charges:  OT General Charges $OT Visit: 1  Visit OT Evaluation $OT Eval Moderate Complexity: 1 Mod  Perrin Maltese, OTR/L Acute Rehabilitation Services  Office [phone] 05/23/2023

## 2023-05-23 NOTE — H&P (Addendum)
History and Physical  Virginia [name] ZOX:[id number] DOB: 1976/02/12 DOA: 05/22/2023  Referring physician: Dr. Madilyn Hook, EDP  PCP: [place], MD  Outpatient Specialists: Pulmonary Patient coming from: Home.  Chief Complaint: Dizziness  HPI: Virginia [name] is a 47 y.o. female with medical history significant for hypertension, type 2 diabetes, who presented to the ED from home via EMS due to nausea vomiting chest pain and dizziness.  Describes her chest pain as sharp left-sided radiating to her left jaw.  Also endorses epigastric pain associated with nausea vomiting, and dizziness that started abruptly around noon.  In the ED, high-sensitivity troponin was negative x 2 with no evidence of acute ischemia on twelve-lead EKG.  She received IV analgesics and IV antiemetics.  Upon EDP assessment the patient was noted to have diplopia on exam.  EDP discussed the case with neurology/stroke team recommended admission for stroke workup and allow for permissive hypertension.  The patient was admitted by [facility], hospitalist service.  ED Course: Temperature 98.3.  BP 197/112, pulse 81, respiratory rate 18.  O2 saturation 100% on room air.  Review of Systems: Review of systems as noted in the HPI. All other systems reviewed and are negative.   Past Medical History:  Diagnosis Date   Anxiety    Arthritis    Bipolar affective (HCC)    Depression    Diabetes mellitus    Hypertension    Renal disorder    Past Surgical History:  Procedure Laterality Date   CESAREAN SECTION     CHOLECYSTECTOMY     GASTRIC BYPASS     KNEE SURGERY      Social History:  reports that she quit smoking about 10 years ago. Her smoking use included cigarettes. She smoked an average of .5 packs per day. She has never used smokeless tobacco. She reports current alcohol use. She reports that she does not use drugs.   Allergies  Allergen Reactions   Ace Inhibitors Nausea And Vomiting and Cough   Banana Nausea  And Vomiting   Lamictal [Lamotrigine] Nausea And Vomiting   Nsaids Other (See Comments)    Kidney failure   Topiramate Nausea And Vomiting    Family History  Problem Relation Age of Onset   Asthma Mother    Hypertension Mother    Hypertension Father    Diabetes Mellitus II Father       Prior to Admission medications   Medication Sig Start Date End Date Taking? Authorizing Provider  acetaminophen (TYLENOL) 500 MG tablet Take 1,000 mg by mouth daily as needed for mild pain or headache.    [provider]  albuterol (VENTOLIN HFA) 108 (90 Base) MCG/ACT inhaler Inhale 2 puffs every 4 (four) hours as needed into the lungs for wheezing or shortness of breath.    [provider]  ALPRAZolam Prudy Feeler) 1 MG tablet Take 1 mg by mouth 3 (three) times daily. 02/26/20   [provider]  amphetamine-dextroamphetamine (ADDERALL) 20 MG tablet Take 20 mg by mouth 3 (three) times daily. 02/26/20   [provider]  amphetamine-dextroamphetamine (ADDERALL) 30 MG tablet Take 1 tablet by mouth 2 (two) times daily. 05/06/23     Blood Glucose Monitoring Suppl DEVI 1 each by Does not apply route in the morning, at noon, and at bedtime. May substitute to any manufacturer covered by patient's insurance. 03/05/23   Lanae Boast, MD  FLUoxetine (PROZAC) 40 MG capsule Take 40 mg by mouth daily. 12/21/19   [provider]  fluticasone (FLONASE) 50 MCG/ACT nasal spray Place 2 sprays into both nostrils daily. 03/06/23 04/05/23  Lanae Boast, MD  insulin detemir (LEVEMIR) 100 UNIT/ML FlexPen Inject 12 Units into the skin daily. 04/19/23 06/18/23  Mannie Stabile, PA-C  loratadine (CLARITIN) 10 MG tablet Take 1 tablet (10 mg total) by mouth daily. 03/06/23 04/05/23  Lanae Boast, MD  metFORMIN (GLUCOPHAGE) 1000 MG tablet Take 0.5 tablets (500 mg total) by mouth 2 (two) times daily with a meal for 20 days. Patient taking differently: Take 500 mg by mouth daily. 06/25/21 02/29/24  Caccavale, Sophia,  PA-C  mometasone-formoterol (DULERA) 100-5 MCG/ACT AERO Inhale 2 puffs into the lungs 2 (two) times daily. 03/05/23 04/04/23  Lanae Boast, MD  montelukast (SINGULAIR) 10 MG tablet Take 1 tablet (10 mg total) by mouth at bedtime. 03/05/23 04/04/23  Lanae Boast, MD    Physical Exam: BP (!) 188/108   Pulse 86   Temp 98.3 F (36.8 C)   Resp (!) 32   Ht 5\' 1"  (1.549 m)   Wt 79.4 kg   SpO2 100%   BMI 33.07 kg/m   General: 47 y.o. year-old female weak-appearing in no acute distress.  Alert and oriented x3. Cardiovascular: Regular rate and rhythm with no rubs or gallops.  No thyromegaly or JVD noted.  No lower extremity edema. 2/4 pulses in all 4 extremities. Respiratory: Clear to auscultation with no wheezes or rales. Good inspiratory effort. Abdomen: Soft nontender nondistended with normal bowel sounds x4 quadrants. Muskuloskeletal: No cyanosis, clubbing or edema noted bilaterally Neuro: CN II-XII intact, strength, sensation, reflexes Skin: No ulcerative lesions noted or rashes Psychiatry: Judgement and insight appear normal. Mood is appropriate for condition and setting          Labs on Admission:  Basic Metabolic Panel: Recent Labs  Lab 05/22/23 2300 05/23/23 0005  NA 133* 125*  K 4.9 >8.5*  CL 105  --   CO2 18*  --   GLUCOSE 334*  --   BUN 17  --   CREATININE 1.17*  --   CALCIUM 8.1*  --    Liver Function Tests: Recent Labs  Lab 05/22/23 2300  AST 23  ALT 24  ALKPHOS 98  BILITOT 1.0  PROT 6.8  ALBUMIN 2.9*   Recent Labs  Lab 05/22/23 2300  LIPASE 25   No results for input(s): "AMMONIA" in the last 168 hours. CBC: Recent Labs  Lab 05/22/23 2300 05/23/23 0005  WBC 10.3  --   HGB 12.1 13.6  HCT 37.6 40.0  MCV 89.1  --   PLT 232  --    Cardiac Enzymes: No results for input(s): "CKTOTAL", "CKMB", "CKMBINDEX", "TROPONINI" in the last 168 hours.  BNP (last 3 results) Recent Labs    03/01/23 1625 03/02/23 0237  BNP 291.6* 191.6*    ProBNP (last 3  results) No results for input(s): "PROBNP" in the last 8760 hours.  CBG: Recent Labs  Lab 05/22/23 2253 05/23/23 0109  GLUCAP 313* 296*    Radiological Exams on Admission: CT ANGIO HEAD NECK W WO CM  Result Date: 05/23/2023 CLINICAL DATA:  Initial evaluation for mental status change. Concern for stroke. EXAM: CT ANGIOGRAPHY HEAD AND NECK WITH AND WITHOUT CONTRAST TECHNIQUE: Multidetector CT imaging of the head and neck was performed using the standard protocol during bolus administration of intravenous contrast. Multiplanar CT image reconstructions and MIPs were obtained to evaluate the vascular anatomy. Carotid stenosis measurements (when applicable) are obtained utilizing NASCET criteria, using the distal  internal carotid diameter as the denominator. RADIATION DOSE REDUCTION: This exam was performed according to the departmental dose-optimization program which includes automated exposure control, adjustment of the mA and/or kV according to patient size and/or use of iterative reconstruction technique. CONTRAST:  60mL OMNIPAQUE IOHEXOL 350 MG/ML SOLN COMPARISON:  Prior study from 04/18/2018. FINDINGS: CT HEAD FINDINGS Brain: Some contrast material remains on board from CTA performed earlier the same day. Cerebral volume within normal limits for patient age. No evidence for acute intracranial hemorrhage. No findings to suggest acute large vessel territory infarct. No mass lesion, midline shift, or mass effect. Ventricles are normal in size without evidence for hydrocephalus. No extra-axial fluid collection identified. Vascular: Contrast material seen throughout the intracranial vasculature. Skull: Scalp soft tissues demonstrate no acute abnormality. Calvarium intact. Sinuses/Orbits: Globes and orbital soft tissues within normal limits. Visualized paranasal sinuses are clear. No mastoid effusion. CTA NECK FINDINGS Aortic arch: Standard branching. Imaged portion shows no evidence of aneurysm or  dissection. No significant stenosis of the major arch vessel origins. Right carotid system: No evidence of dissection, stenosis (50% or greater), or occlusion. Left carotid system: No evidence of dissection, stenosis (50% or greater), or occlusion. Vertebral arteries: Right vertebral artery dominant. Left vertebral artery hypoplastic and arises directly from the aortic arch. No dissection or stenosis. Skeleton: No discrete or worrisome osseous lesions. Other neck: No other acute finding within the neck. Upper chest: No acute finding within the visualized upper chest. Review of the MIP images confirms the above findings CTA HEAD FINDINGS Anterior circulation: Both internal carotid arteries are widely patent through the siphons without stenosis or other abnormality. Right A1 segment widely patent. Left A1 hypoplastic and/or absent. Tiny 1-2 mm outpouching arising from the anterior communicating canning artery complex felt to be most consistent with a small vascular infundibulum, likely related to a hypoplastic left A1 segment (series 11, image 109). Both ACAs widely patent distally without stenosis. No M1 stenosis or occlusion. No proximal MCA branch occlusion or high-grade stenosis. Distal MCA branches perfused and symmetric. Posterior circulation: Dominant right V4 segment widely patent. Left V4 segment hypoplastic and patent as well. Both PICA patent at their origins. Basilar patent without stenosis. Superior cerebral arteries patent bilaterally. Right PCA supplied via the basilar. Fetal type origin of the left PCA. Both PCAs are widely patent to their distal aspects without stenosis. Venous sinuses: Patent allowing for timing the contrast bolus. Anatomic variants: As above.  No aneurysm. Review of the MIP images confirms the above findings IMPRESSION: 1. Normal CTA of the head and neck. No large vessel occlusion or other emergent finding. No hemodynamically significant or correctable stenosis. 2. No other acute  intracranial abnormality. Electronically Signed   By: Rise Mu M.D.   On: 05/23/2023 03:42   CT Angio Chest/Abd/Pel for Dissection W and/or W/WO  Result Date: 05/23/2023 CLINICAL DATA:  Acute aortic syndrome EXAM: CT ANGIOGRAPHY CHEST, ABDOMEN AND PELVIS TECHNIQUE: Non-contrast CT of the chest was initially obtained. Multidetector CT imaging through the chest, abdomen and pelvis was performed using the standard protocol during bolus administration of intravenous contrast. Multiplanar reconstructed images and MIPs were obtained and reviewed to evaluate the vascular anatomy. RADIATION DOSE REDUCTION: This exam was performed according to the departmental dose-optimization program which includes automated exposure control, adjustment of the mA and/or kV according to patient size and/or use of iterative reconstruction technique. CONTRAST:  75mL OMNIPAQUE IOHEXOL 350 MG/ML SOLN COMPARISON:  03/01/2023 FINDINGS: CTA CHEST FINDINGS Cardiovascular: Preferential opacification of the thoracic  aorta. No evidence of thoracic aortic aneurysm or dissection. Normal heart size. No pericardial effusion. Mediastinum/Nodes: Visualized thyroid is unremarkable. The esophagus is patulous but is otherwise unremarkable. No pathologic adenopathy within the thorax. Lungs/Pleura: Lungs are clear. No pleural effusion or pneumothorax. Musculoskeletal: No acute bone abnormality. No lytic or blastic bone lesion. Review of the MIP images confirms the above findings. CTA ABDOMEN AND PELVIS FINDINGS VASCULAR Aorta: Normal caliber aorta without aneurysm, dissection, vasculitis or significant stenosis. Celiac: Patent without evidence of aneurysm, dissection, vasculitis or significant stenosis. Note is made of an accessory left hepatic artery. SMA: Patent without evidence of aneurysm, dissection, vasculitis or significant stenosis. Note is made of an replaced right hepatic artery. Renals: Both renal arteries are patent without evidence  of aneurysm, dissection, vasculitis, fibromuscular dysplasia or significant stenosis. IMA: Patent without evidence of aneurysm, dissection, vasculitis or significant stenosis. Inflow: Patent without evidence of aneurysm, dissection, vasculitis or significant stenosis. Veins: No obvious venous abnormality within the limitations of this arterial phase study. Review of the MIP images confirms the above findings. NON-VASCULAR Hepatobiliary: No focal liver abnormality is seen. Status post cholecystectomy. No intrahepatic biliary ductal dilation. Mild extrahepatic biliary ductal dilation which tapers toward the ampulla is again identified likely representing post cholecystectomy change. Pancreas: Unremarkable Spleen: Unremarkable Adrenals/Urinary Tract: The adrenal glands are unremarkable. The kidneys are normal save for mild right cortical scarring. Small nondependent gas is seen within the bladder lumen which may relate to recent catheterization. The bladder is otherwise unremarkable. Stomach/Bowel: Surgical changes of gastric bypass are identified. The stomach, small bowel, and large bowel are otherwise unremarkable. No evidence of obstruction or focal inflammation. Appendix normal. No free intraperitoneal gas or fluid Lymphatic: No pathologic adenopathy within the abdomen and pelvis. Reproductive: Uterus and bilateral adnexa are unremarkable. Other: No no abdominal wall hernia Musculoskeletal: No acute bone abnormality. No lytic or blastic bone lesion. Review of the MIP images confirms the above findings. IMPRESSION: 1. No acute intrathoracic or intra-abdominal pathology identified. No thoracoabdominal aortic aneurysm or dissection. 2. Surgical changes of gastric bypass and cholecystectomy. Electronically Signed   By: Helyn Numbers M.D.   On: 05/23/2023 01:02   DG Chest Portable 1 View  Result Date: 05/22/2023 CLINICAL DATA:  Chest pain and vomiting. EXAM: PORTABLE CHEST 1 VIEW COMPARISON:  March 01, 2023  FINDINGS: The heart size and mediastinal contours are within normal limits. Both lungs are clear. The visualized skeletal structures are unremarkable. IMPRESSION: No active disease. Electronically Signed   By: Aram Candela M.D.   On: 05/22/2023 23:32    EKG: I independently viewed the EKG done and my findings are as followed: Sinus rhythm rate of 78 nonspecific ST-T changes.  QTc 499.  Assessment/Plan Present on Admission: **None**  Principal Problem:   Dizziness  Dizziness, rule out CVA Seen by neurology/stroke team and admitted for CVA workup Follow MRI brain without contrast Neurochecks every 4 hours Fasting lipid panel, A1c 2D echo Aspirin and Plavix for 21 days Telemetry monitoring PT/OT/speech therapist evaluation Fall precautions Neurology will continue to follow  Intractable nausea vomiting May be caused by her dizziness Supportive care IV antiemetics as needed IV fluid hydration  Questionable hyperkalemia Repeated BMP pending, follow and treat if confirmed. No peaked T waves on EKG.  Hypertension Hold off home oral hypertensives Ongoing permissive hypertension Treat SBP greater than 220 or DBP greater than 120. IV labetalol as needed with parameters. Closely monitor vital signs  Type 2 diabetes with hyperglycemia Last hemoglobin A1c 12.2 in  March 2024. Obtain hemoglobin A1c Goal hemoglobin A1c less than 7.0 Heart healthy carb modified diet after the patient passes a swallow evaluation. Insulin sliding scale  Obesity BMI 33 Recommend weight loss outpatient with regular physical activity and healthy dieting.   DVT prophylaxis: Subcu Lovenox daily  Code Status: Full code  Family Communication: Significant other at bedside  Disposition Plan: Admitted to telemetry medical unit  Consults called: Neurology  Admission status: Observation status   Status is: Observation    Darlin Drop MD [facility] Pager [phone]  If 7PM-7AM,  please contact night-coverage [email or web address] Password [facility]  05/23/2023, 5:32 AM

## 2023-05-23 NOTE — Inpatient Diabetes Management (Signed)
Inpatient Diabetes Program Recommendations  AACE/ADA: New Consensus Statement on Inpatient Glycemic Control (2015)  Target Ranges:  Prepandial:   less than 140 mg/dL      Peak postprandial:   less than 180 mg/dL (1-2 hours)      Critically ill patients:  140 - 180 mg/dL   Lab Results  Component Value Date   GLUCAP 317 (H) 05/23/2023   HGBA1C 11.0 (H) 05/23/2023    Review of Glycemic Control  Latest Reference Range & Units 05/23/23 05:40 05/23/23 06:45 05/23/23 08:21  Glucose-Capillary 70 - 99 mg/dL 657 (H) 846 (H) 962 (H)  (H): Data is abnormally high  Diabetes history: DM2 Outpatient Diabetes medications: Levemir 12 units QD, Metformin 500 mg QD Current orders for Inpatient glycemic control: Novolog 0-9 units TID and 0-5 units QHS  Inpatient Diabetes Program Recommendations:    Please consider:  Levemir 12 units QD  Will continue to follow while inpatient.  Thank you, Dulce Sellar, MSN, CDCES Diabetes Coordinator Inpatient Diabetes Program [phone] (team pager from 8a-5p)

## 2023-05-23 NOTE — ED Notes (Signed)
PT to MRI, well appearing upon transport.  

## 2023-05-23 NOTE — Plan of Care (Signed)
  Problem: Coping: Goal: Ability to adjust to condition or change in health will improve Outcome: Progressing   Problem: Health Behavior/Discharge Planning: Goal: Ability to manage health-related needs will improve Outcome: Progressing   

## 2023-05-23 NOTE — Progress Notes (Signed)
STROKE TEAM PROGRESS NOTE   INTERVAL HISTORY Her family is at the bedside.  Patient is laying in the bed she is lethargic, complains of dizziness and feeling nauseous with dry heaving.  Very difficult to examine her as she is very uncomfortable.  EEG tech at the bedside connecting her to EEG.  EEG read normal She presented yesterday to the hospital with dizziness and diplopia, then became dizzy and nauseated and was vomiting all day so came to be evaluated.  MRI negative for acute process. Was quite hypertensive in the emergency room 240s/120s.  She endorses that she does not take any antihypertensives UDS has been ordered, A1c is 11  Vitals:   05/23/23 1400 05/23/23 1415 05/23/23 1430 05/23/23 1440  BP: (!) 148/93  (!) 158/90 (!) 158/90  Pulse: 80 80 78 79  Resp:    14  Temp:      TempSrc:      SpO2: 100% 100% 100% 100%  Weight:      Height:       CBC:  Recent Labs  Lab 05/22/23 2300 05/23/23 0005 05/23/23 0635  WBC 10.3  --  12.7*  HGB 12.1 13.6 11.9*  HCT 37.6 40.0 36.6  MCV 89.1  --  88.4  PLT 232  --  245    Basic Metabolic Panel:  Recent Labs  Lab 05/22/23 2300 05/23/23 0005 05/23/23 0635  NA 133* 125* 131*  K 4.9 >8.5* 5.4*  CL 105  --  103  CO2 18*  --  16*  GLUCOSE 334*  --  343*  BUN 17  --  18  CREATININE 1.17*  --  1.26*  CALCIUM 8.1*  --  7.9*    Lipid Panel:  Recent Labs  Lab 05/23/23 0635  CHOL 181  TRIG 71  HDL 81  CHOLHDL 2.2  VLDL 14  LDLCALC 86    HgbA1c:  Recent Labs  Lab 05/23/23 0635  HGBA1C 11.0*    Urine Drug Screen: No results for input(s): "LABOPIA", "COCAINSCRNUR", "LABBENZ", "AMPHETMU", "THCU", "LABBARB" in the last 168 hours.  Alcohol Level No results for input(s): "ETH" in the last 168 hours.  IMAGING past 24 hours ECHOCARDIOGRAM COMPLETE  Result Date: 05/23/2023    ECHOCARDIOGRAM REPORT   Patient Name:   DAYLA FRAPPIER [facility] Date of Exam: 05/23/2023 Medical Rec #:  [id number]             Height:       61.0 in  Accession #:    [id number]            Weight:       175.0 lb Date of Birth:  07/30/1976              BSA:          1.785 m Patient Age:    47 years              BP:           149/95 mmHg Patient Gender: F                     HR:           74 bpm. Exam Location:  Inpatient Procedure: 2D Echo, Cardiac Doppler and Color Doppler Indications:    CVA  History:        Patient has prior history of Echocardiogram examinations, most  recent 03/02/2023. Risk Factors:Hypertension, Diabetes and                 Dyslipidemia.  Sonographer:    Melissa Morford RDCS (AE, PE) Referring Phys: [id number] CAROLE N HALL  Sonographer Comments: No subcostal window. IMPRESSIONS  1. Left ventricular ejection fraction, by estimation, is 60 to 65%. The left ventricle has normal function. The left ventricle has no regional wall motion abnormalities. There is moderate to severe concentric left ventricular hypertrophy. Left ventricular diastolic parameters are consistent with Grade I diastolic dysfunction (impaired relaxation).  2. Right ventricular systolic function is normal. The right ventricular size is normal.  3. The mitral valve is normal in structure. No evidence of mitral valve regurgitation.  4. The aortic valve is tricuspid. Aortic valve regurgitation is not visualized. No aortic stenosis is present.  5. Given moderate to severe LVH, consider CMR to assess for infiltrative cardiomyopathy (was suggested on prior study as well). Comparison(s): No significant change from prior study. Conclusion(s)/Recommendation(s): No intracardiac source of embolism detected on this transthoracic study. Consider a transesophageal echocardiogram to exclude cardiac source of embolism if clinically indicated. FINDINGS  Left Ventricle: Left ventricular ejection fraction, by estimation, is 60 to 65%. The left ventricle has normal function. The left ventricle has no regional wall motion abnormalities. The left ventricular internal cavity size was  normal in size. There is  moderate to severe concentric left ventricular hypertrophy. Left ventricular diastolic parameters are consistent with Grade I diastolic dysfunction (impaired relaxation). Right Ventricle: The right ventricular size is normal. No increase in right ventricular wall thickness. Right ventricular systolic function is normal. Left Atrium: Left atrial size was normal in size. Right Atrium: Right atrial size was normal in size. Pericardium: There is no evidence of pericardial effusion. Mitral Valve: The mitral valve is normal in structure. No evidence of mitral valve regurgitation. Tricuspid Valve: The tricuspid valve is normal in structure. Tricuspid valve regurgitation is trivial. Aortic Valve: The aortic valve is tricuspid. Aortic valve regurgitation is not visualized. No aortic stenosis is present. Pulmonic Valve: The pulmonic valve was normal in structure. Pulmonic valve regurgitation is trivial. Aorta: The aortic root is normal in size and structure. Venous: The inferior vena cava was not well visualized. IAS/Shunts: The atrial septum is grossly normal.  LEFT VENTRICLE PLAX 2D LVIDd:         3.90 cm   Diastology LVIDs:         2.70 cm   LV e' medial:    4.79 cm/s LV PW:         1.10 cm   LV E/e' medial:  15.3 LV IVS:        0.90 cm   LV e' lateral:   5.87 cm/s LVOT diam:     2.00 cm   LV E/e' lateral: 12.5 LV SV:         64 LV SV Index:   36 LVOT Area:     3.14 cm  RIGHT VENTRICLE RV S prime:     9.68 cm/s TAPSE (M-mode): 2.3 cm LEFT ATRIUM             Index        RIGHT ATRIUM           Index LA diam:        4.00 cm 2.24 cm/m   RA Area:     12.70 cm LA Vol (A2C):   51.8 ml 29.02 ml/m  RA Volume:   29.20 ml  16.36 ml/m LA Vol (A4C):   33.0 ml 18.49 ml/m LA Biplane Vol: 44.0 ml 24.65 ml/m  AORTIC VALVE LVOT Vmax:   89.50 cm/s LVOT Vmean:  68.500 cm/s LVOT VTI:    0.204 m  AORTA Ao Root diam: 2.40 cm MITRAL VALVE               TRICUSPID VALVE MV Area (PHT): 3.93 cm    TR Peak grad:    13.8 mmHg MV E velocity: 73.50 cm/s  TR Vmax:        186.00 cm/s MV A velocity: 96.10 cm/s MV E/A ratio:  0.76        SHUNTS                            Systemic VTI:  0.20 m                            Systemic Diam: 2.00 cm Laurance Flatten MD Electronically signed by Laurance Flatten MD Signature Date/Time: 05/23/2023/12:33:46 PM    Final    EEG adult  Result Date: 05/23/2023 Charlsie Quest, MD     05/23/2023  1:10 PM Patient Name: Daniece Heidel MRN: [id number] Epilepsy Attending: Charlsie Quest Referring Physician/Provider: Osvaldo Shipper, MD Date: 05/23/2023 Duration: 23.42 mins Patient history: 47yo F with dizziness getting eeg to evaluate for seizure. Level of alertness: Awake, asleep AEDs during EEG study: None Technical aspects: This EEG study was done with scalp electrodes positioned according to the 10-20 International system of electrode placement. Electrical activity was reviewed with band pass filter of 1-70Hz , sensitivity of 7 uV/mm, display speed of 66mm/sec with a 60Hz  notched filter applied as appropriate. EEG data were recorded continuously and digitally stored.  Video monitoring was available and reviewed as appropriate. Description: The posterior dominant rhythm consists of 8-9 Hz activity of moderate voltage (25-35 uV) seen predominantly in posterior head regions, symmetric and reactive to eye opening and eye closing. Sleep was characterized by vertex waves, sleep spindles (12 to 14 Hz), maximal frontocentral region. Hyperventilation and photic stimulation were not performed.   IMPRESSION: This study is within normal limits. No seizures or epileptiform discharges were seen throughout the recording. A normal interictal EEG does not exclude the diagnosis of epilepsy. Charlsie Quest   MR BRAIN WO CONTRAST  Result Date: 05/23/2023 CLINICAL DATA:  Neuro deficit, acute, stroke suspected diplopia, right eye will not cross midline, dizziness. EXAM: MRI HEAD WITHOUT CONTRAST  TECHNIQUE: Multiplanar, multiecho pulse sequences of the brain and surrounding structures were obtained without intravenous contrast. COMPARISON:  CTA head/neck 05/23/2023. FINDINGS: Brain: Motion degraded study. Within this limitation, no acute infarct or hemorrhage. Mild chronic small-vessel disease. No mass or midline shift. No hydrocephalus or extra-axial collection. No abnormal susceptibility. Vascular: Normal flow voids. Skull and upper cervical spine: Normal marrow signal. Sinuses/Orbits: Unremarkable. Other: None. IMPRESSION: Motion degraded study. Within this limitation, no acute intracranial process. Electronically Signed   By: Orvan Falconer M.D.   On: 05/23/2023 09:39   CT ANGIO HEAD NECK W WO CM  Result Date: 05/23/2023 CLINICAL DATA:  Initial evaluation for mental status change. Concern for stroke. EXAM: CT ANGIOGRAPHY HEAD AND NECK WITH AND WITHOUT CONTRAST TECHNIQUE: Multidetector CT imaging of the head and neck was performed using the standard protocol during bolus administration of intravenous contrast. Multiplanar CT image reconstructions and MIPs were obtained to evaluate the vascular  anatomy. Carotid stenosis measurements (when applicable) are obtained utilizing NASCET criteria, using the distal internal carotid diameter as the denominator. RADIATION DOSE REDUCTION: This exam was performed according to the departmental dose-optimization program which includes automated exposure control, adjustment of the mA and/or kV according to patient size and/or use of iterative reconstruction technique. CONTRAST:  60mL OMNIPAQUE IOHEXOL 350 MG/ML SOLN COMPARISON:  Prior study from 04/18/2018. FINDINGS: CT HEAD FINDINGS Brain: Some contrast material remains on board from CTA performed earlier the same day. Cerebral volume within normal limits for patient age. No evidence for acute intracranial hemorrhage. No findings to suggest acute large vessel territory infarct. No mass lesion, midline shift, or mass  effect. Ventricles are normal in size without evidence for hydrocephalus. No extra-axial fluid collection identified. Vascular: Contrast material seen throughout the intracranial vasculature. Skull: Scalp soft tissues demonstrate no acute abnormality. Calvarium intact. Sinuses/Orbits: Globes and orbital soft tissues within normal limits. Visualized paranasal sinuses are clear. No mastoid effusion. CTA NECK FINDINGS Aortic arch: Standard branching. Imaged portion shows no evidence of aneurysm or dissection. No significant stenosis of the major arch vessel origins. Right carotid system: No evidence of dissection, stenosis (50% or greater), or occlusion. Left carotid system: No evidence of dissection, stenosis (50% or greater), or occlusion. Vertebral arteries: Right vertebral artery dominant. Left vertebral artery hypoplastic and arises directly from the aortic arch. No dissection or stenosis. Skeleton: No discrete or worrisome osseous lesions. Other neck: No other acute finding within the neck. Upper chest: No acute finding within the visualized upper chest. Review of the MIP images confirms the above findings CTA HEAD FINDINGS Anterior circulation: Both internal carotid arteries are widely patent through the siphons without stenosis or other abnormality. Right A1 segment widely patent. Left A1 hypoplastic and/or absent. Tiny 1-2 mm outpouching arising from the anterior communicating canning artery complex felt to be most consistent with a small vascular infundibulum, likely related to a hypoplastic left A1 segment (series 11, image 109). Both ACAs widely patent distally without stenosis. No M1 stenosis or occlusion. No proximal MCA branch occlusion or high-grade stenosis. Distal MCA branches perfused and symmetric. Posterior circulation: Dominant right V4 segment widely patent. Left V4 segment hypoplastic and patent as well. Both PICA patent at their origins. Basilar patent without stenosis. Superior cerebral  arteries patent bilaterally. Right PCA supplied via the basilar. Fetal type origin of the left PCA. Both PCAs are widely patent to their distal aspects without stenosis. Venous sinuses: Patent allowing for timing the contrast bolus. Anatomic variants: As above.  No aneurysm. Review of the MIP images confirms the above findings IMPRESSION: 1. Normal CTA of the head and neck. No large vessel occlusion or other emergent finding. No hemodynamically significant or correctable stenosis. 2. No other acute intracranial abnormality. Electronically Signed   By: Rise Mu M.D.   On: 05/23/2023 03:42   CT Angio Chest/Abd/Pel for Dissection W and/or W/WO  Result Date: 05/23/2023 CLINICAL DATA:  Acute aortic syndrome EXAM: CT ANGIOGRAPHY CHEST, ABDOMEN AND PELVIS TECHNIQUE: Non-contrast CT of the chest was initially obtained. Multidetector CT imaging through the chest, abdomen and pelvis was performed using the standard protocol during bolus administration of intravenous contrast. Multiplanar reconstructed images and MIPs were obtained and reviewed to evaluate the vascular anatomy. RADIATION DOSE REDUCTION: This exam was performed according to the departmental dose-optimization program which includes automated exposure control, adjustment of the mA and/or kV according to patient size and/or use of iterative reconstruction technique. CONTRAST:  75mL OMNIPAQUE IOHEXOL 350 MG/ML  SOLN COMPARISON:  03/01/2023 FINDINGS: CTA CHEST FINDINGS Cardiovascular: Preferential opacification of the thoracic aorta. No evidence of thoracic aortic aneurysm or dissection. Normal heart size. No pericardial effusion. Mediastinum/Nodes: Visualized thyroid is unremarkable. The esophagus is patulous but is otherwise unremarkable. No pathologic adenopathy within the thorax. Lungs/Pleura: Lungs are clear. No pleural effusion or pneumothorax. Musculoskeletal: No acute bone abnormality. No lytic or blastic bone lesion. Review of the MIP images  confirms the above findings. CTA ABDOMEN AND PELVIS FINDINGS VASCULAR Aorta: Normal caliber aorta without aneurysm, dissection, vasculitis or significant stenosis. Celiac: Patent without evidence of aneurysm, dissection, vasculitis or significant stenosis. Note is made of an accessory left hepatic artery. SMA: Patent without evidence of aneurysm, dissection, vasculitis or significant stenosis. Note is made of an replaced right hepatic artery. Renals: Both renal arteries are patent without evidence of aneurysm, dissection, vasculitis, fibromuscular dysplasia or significant stenosis. IMA: Patent without evidence of aneurysm, dissection, vasculitis or significant stenosis. Inflow: Patent without evidence of aneurysm, dissection, vasculitis or significant stenosis. Veins: No obvious venous abnormality within the limitations of this arterial phase study. Review of the MIP images confirms the above findings. NON-VASCULAR Hepatobiliary: No focal liver abnormality is seen. Status post cholecystectomy. No intrahepatic biliary ductal dilation. Mild extrahepatic biliary ductal dilation which tapers toward the ampulla is again identified likely representing post cholecystectomy change. Pancreas: Unremarkable Spleen: Unremarkable Adrenals/Urinary Tract: The adrenal glands are unremarkable. The kidneys are normal save for mild right cortical scarring. Small nondependent gas is seen within the bladder lumen which may relate to recent catheterization. The bladder is otherwise unremarkable. Stomach/Bowel: Surgical changes of gastric bypass are identified. The stomach, small bowel, and large bowel are otherwise unremarkable. No evidence of obstruction or focal inflammation. Appendix normal. No free intraperitoneal gas or fluid Lymphatic: No pathologic adenopathy within the abdomen and pelvis. Reproductive: Uterus and bilateral adnexa are unremarkable. Other: No no abdominal wall hernia Musculoskeletal: No acute bone abnormality. No  lytic or blastic bone lesion. Review of the MIP images confirms the above findings. IMPRESSION: 1. No acute intrathoracic or intra-abdominal pathology identified. No thoracoabdominal aortic aneurysm or dissection. 2. Surgical changes of gastric bypass and cholecystectomy. Electronically Signed   By: Helyn Numbers M.D.   On: 05/23/2023 01:02   DG Chest Portable 1 View  Result Date: 05/22/2023 CLINICAL DATA:  Chest pain and vomiting. EXAM: PORTABLE CHEST 1 VIEW COMPARISON:  March 01, 2023 FINDINGS: The heart size and mediastinal contours are within normal limits. Both lungs are clear. The visualized skeletal structures are unremarkable. IMPRESSION: No active disease. Electronically Signed   By: Aram Candela M.D.   On: 05/22/2023 23:32    PHYSICAL EXAM  Temp:  [97.9 F (36.6 C)-98.3 F (36.8 C)] 97.9 F (36.6 C) (06/10 1213) Pulse Rate:  [69-89] 79 (06/10 1440) Resp:  [12-32] 14 (06/10 1440) BP: (117-244)/(85-122) 158/90 (06/10 1440) SpO2:  [100 %] 100 % (06/10 1440) Weight:  [79.4 kg] 79.4 kg (06/09 2253)  General - Well nourished, well developed, uncomfortable due to dizziness and nausea Cardiovascular - Regular rhythm and rate.  Mental Status -  Level of arousal and orientation to time, place, and person were intact. Language including expression, naming, repetition, comprehension was assessed and found intact. Attention span and concentration were normal. Recent and remote memory were intact. Fund of Knowledge was assessed and was intact.  Cranial Nerves II - XII - II - Visual field very difficult to examine her eyes as she is squeezing them shot however does have blink  to threat bilaterally III, IV, VI -on forced opening she does have a rightward gaze and will cross midline.  Nystagmus on leftward gaze. complains of photosensitivity V - Facial sensation intact bilaterally. VII -right facial droop VIII - Hearing & vestibular intact bilaterally. X - Palate elevates  symmetrically. XI - Chin turning & shoulder shrug intact bilaterally. XII - Tongue protrusion intact.  Motor Strength -generalized weakness metric throughout.  The patient's strength was normal in all extremities and pronator drift was absent.  Bulk was normal and fasciculations were absent.   Motor Tone - Muscle tone was assessed at the neck and appendages and was normal  Sensory - Light touch, temperature/pinprick were assessed and were symmetrical.    Coordination - The patient had normal movements in the hands and feet with no ataxia or dysmetria.  Tremor was absent.  Gait and Station - deferred.  ASSESSMENT/PLAN Ms. Virginia [name] is a 47 y.o. female with history of gastric bypass, bipolar disorder, anxiety and depression, uncontrolled diabetes, uncontrolled hypertension presenting with dizziness and diplopia followed by nausea and vomiting.   MRI negative posterior stroke versus hypertensive encephalopathy CT head No acute abnormality.  CTA head & neck No LVO  MRI  no acute process  2D Echo EF 60 to 65%.  Moderate to severe left ventricular hypertrophy, grade 1 diastolic dysfunction LDL 86 HgbA1c 11.0 Routine EEG normal VTE prophylaxis - Lovenox    Diet   Diet NPO time specified Except for: Sips with Meds, Ice Chips   No antithrombotic prior to admission, now on clopidogrel 75 mg daily.  Therapy recommendations:  pending Disposition:  pending  Hypertension Hypertensive urgency  Home meds:  none UnStable Permissive hypertension (OK if < 220/120) but gradually normalize in 5-7 days Long-term BP goal normotensive  Hyperlipidemia Home meds:  none,  LDL 86, goal < 70 Add Crestor 10mg   Continue statin at discharge  Diabetes type II UnControlled Home meds:  metformin 500mg  BID, insulin  HgbA1c 11.0, goal < 7.0 CBGs Recent Labs    05/23/23 0645 05/23/23 0821 05/23/23 1134  GLUCAP 343* 317* 193*     [facility] Diabetes management consult Will need close  outpatient follow up after discharge for better DM management   Tobacco abuse EtOH use History of drug abuse? Current cigarette smoker about 4-5 a day  Drinks about 2 malt liquors a day Declined nicotine patch  Cessation education Checking UDS  Other Stroke Risk Factors Cigarette smoker advised to quit smoking ETOH use,  advised to drink no more than 1 drink(s) a day Substance abuse - UDS:  THC No results found for requested labs within last 1095 days., Cocaine No results found for requested labs within last 1095 days.. Patient advised to stop using due to stroke risk. Obesity, Body mass index is 33.07 kg/m., BMI >/= 30 associated with increased stroke risk, recommend weight loss, diet and exercise as appropriate   Other Active Problems CKD stage III  Hospital day # 0  Gevena Mart DNP, ACNPC-AG  [facility]   To contact Stroke Continuity provider, please refer to [email or web address]. After hours, contact General Neurology

## 2023-05-23 NOTE — Progress Notes (Addendum)
STROKE TEAM PROGRESS NOTE   INTERVAL HISTORY Her family is at the bedside.  Patient is laying in the bed she is lethargic, complains of dizziness and feeling nauseous with dry heaving.  Very difficult to examine her as she is very uncomfortable.  EEG tech at the bedside connecting her to EEG.  EEG read normal She presented yesterday to the hospital with dizziness and diplopia, then became dizzy and nauseated and was vomiting all day so came to be evaluated.  MRI negative for acute process. Was quite hypertensive in the emergency room 240s/120s.  She endorses that she does not take any antihypertensives UDS has been ordered, A1c is 11  Vitals:   05/23/23 0650 05/23/23 0715 05/23/23 0800 05/23/23 0950  BP: (!) 231/91 (!) 217/111 (!) 117/91 (!) 149/95  Pulse: 72 89 70 77  Resp: 20 (!) 27 14 15   Temp:      TempSrc:      SpO2: 100% 100% 100% 100%  Weight:      Height:       CBC:  Recent Labs  Lab 05/22/23 2300 05/23/23 0005 05/23/23 0635  WBC 10.3  --  12.7*  HGB 12.1 13.6 11.9*  HCT 37.6 40.0 36.6  MCV 89.1  --  88.4  PLT 232  --  245   Basic Metabolic Panel:  Recent Labs  Lab 05/22/23 2300 05/23/23 0005 05/23/23 0635  NA 133* 125* 131*  K 4.9 >8.5* 5.4*  CL 105  --  103  CO2 18*  --  16*  GLUCOSE 334*  --  343*  BUN 17  --  18  CREATININE 1.17*  --  1.26*  CALCIUM 8.1*  --  7.9*   Lipid Panel:  Recent Labs  Lab 05/23/23 0635  CHOL 181  TRIG 71  HDL 81  CHOLHDL 2.2  VLDL 14  LDLCALC 86   HgbA1c:  Recent Labs  Lab 05/23/23 0635  HGBA1C 11.0*   Urine Drug Screen: No results for input(s): "LABOPIA", "COCAINSCRNUR", "LABBENZ", "AMPHETMU", "THCU", "LABBARB" in the last 168 hours.  Alcohol Level No results for input(s): "ETH" in the last 168 hours.  IMAGING past 24 hours MR BRAIN WO CONTRAST  Result Date: 05/23/2023 CLINICAL DATA:  Neuro deficit, acute, stroke suspected diplopia, right eye will not cross midline, dizziness. EXAM: MRI HEAD WITHOUT CONTRAST  TECHNIQUE: Multiplanar, multiecho pulse sequences of the brain and surrounding structures were obtained without intravenous contrast. COMPARISON:  CTA head/neck 05/23/2023. FINDINGS: Brain: Motion degraded study. Within this limitation, no acute infarct or hemorrhage. Mild chronic small-vessel disease. No mass or midline shift. No hydrocephalus or extra-axial collection. No abnormal susceptibility. Vascular: Normal flow voids. Skull and upper cervical spine: Normal marrow signal. Sinuses/Orbits: Unremarkable. Other: None. IMPRESSION: Motion degraded study. Within this limitation, no acute intracranial process. Electronically Signed   By: Orvan Falconer M.D.   On: 05/23/2023 09:39   CT ANGIO HEAD NECK W WO CM  Result Date: 05/23/2023 CLINICAL DATA:  Initial evaluation for mental status change. Concern for stroke. EXAM: CT ANGIOGRAPHY HEAD AND NECK WITH AND WITHOUT CONTRAST TECHNIQUE: Multidetector CT imaging of the head and neck was performed using the standard protocol during bolus administration of intravenous contrast. Multiplanar CT image reconstructions and MIPs were obtained to evaluate the vascular anatomy. Carotid stenosis measurements (when applicable) are obtained utilizing NASCET criteria, using the distal internal carotid diameter as the denominator. RADIATION DOSE REDUCTION: This exam was performed according to the departmental dose-optimization program which includes automated exposure control,  adjustment of the mA and/or kV according to patient size and/or use of iterative reconstruction technique. CONTRAST:  60mL OMNIPAQUE IOHEXOL 350 MG/ML SOLN COMPARISON:  Prior study from 04/18/2018. FINDINGS: CT HEAD FINDINGS Brain: Some contrast material remains on board from CTA performed earlier the same day. Cerebral volume within normal limits for patient age. No evidence for acute intracranial hemorrhage. No findings to suggest acute large vessel territory infarct. No mass lesion, midline shift, or mass  effect. Ventricles are normal in size without evidence for hydrocephalus. No extra-axial fluid collection identified. Vascular: Contrast material seen throughout the intracranial vasculature. Skull: Scalp soft tissues demonstrate no acute abnormality. Calvarium intact. Sinuses/Orbits: Globes and orbital soft tissues within normal limits. Visualized paranasal sinuses are clear. No mastoid effusion. CTA NECK FINDINGS Aortic arch: Standard branching. Imaged portion shows no evidence of aneurysm or dissection. No significant stenosis of the major arch vessel origins. Right carotid system: No evidence of dissection, stenosis (50% or greater), or occlusion. Left carotid system: No evidence of dissection, stenosis (50% or greater), or occlusion. Vertebral arteries: Right vertebral artery dominant. Left vertebral artery hypoplastic and arises directly from the aortic arch. No dissection or stenosis. Skeleton: No discrete or worrisome osseous lesions. Other neck: No other acute finding within the neck. Upper chest: No acute finding within the visualized upper chest. Review of the MIP images confirms the above findings CTA HEAD FINDINGS Anterior circulation: Both internal carotid arteries are widely patent through the siphons without stenosis or other abnormality. Right A1 segment widely patent. Left A1 hypoplastic and/or absent. Tiny 1-2 mm outpouching arising from the anterior communicating canning artery complex felt to be most consistent with a small vascular infundibulum, likely related to a hypoplastic left A1 segment (series 11, image 109). Both ACAs widely patent distally without stenosis. No M1 stenosis or occlusion. No proximal MCA branch occlusion or high-grade stenosis. Distal MCA branches perfused and symmetric. Posterior circulation: Dominant right V4 segment widely patent. Left V4 segment hypoplastic and patent as well. Both PICA patent at their origins. Basilar patent without stenosis. Superior cerebral  arteries patent bilaterally. Right PCA supplied via the basilar. Fetal type origin of the left PCA. Both PCAs are widely patent to their distal aspects without stenosis. Venous sinuses: Patent allowing for timing the contrast bolus. Anatomic variants: As above.  No aneurysm. Review of the MIP images confirms the above findings IMPRESSION: 1. Normal CTA of the head and neck. No large vessel occlusion or other emergent finding. No hemodynamically significant or correctable stenosis. 2. No other acute intracranial abnormality. Electronically Signed   By: Rise Mu M.D.   On: 05/23/2023 03:42   CT Angio Chest/Abd/Pel for Dissection W and/or W/WO  Result Date: 05/23/2023 CLINICAL DATA:  Acute aortic syndrome EXAM: CT ANGIOGRAPHY CHEST, ABDOMEN AND PELVIS TECHNIQUE: Non-contrast CT of the chest was initially obtained. Multidetector CT imaging through the chest, abdomen and pelvis was performed using the standard protocol during bolus administration of intravenous contrast. Multiplanar reconstructed images and MIPs were obtained and reviewed to evaluate the vascular anatomy. RADIATION DOSE REDUCTION: This exam was performed according to the departmental dose-optimization program which includes automated exposure control, adjustment of the mA and/or kV according to patient size and/or use of iterative reconstruction technique. CONTRAST:  75mL OMNIPAQUE IOHEXOL 350 MG/ML SOLN COMPARISON:  03/01/2023 FINDINGS: CTA CHEST FINDINGS Cardiovascular: Preferential opacification of the thoracic aorta. No evidence of thoracic aortic aneurysm or dissection. Normal heart size. No pericardial effusion. Mediastinum/Nodes: Visualized thyroid is unremarkable. The esophagus is patulous  but is otherwise unremarkable. No pathologic adenopathy within the thorax. Lungs/Pleura: Lungs are clear. No pleural effusion or pneumothorax. Musculoskeletal: No acute bone abnormality. No lytic or blastic bone lesion. Review of the MIP images  confirms the above findings. CTA ABDOMEN AND PELVIS FINDINGS VASCULAR Aorta: Normal caliber aorta without aneurysm, dissection, vasculitis or significant stenosis. Celiac: Patent without evidence of aneurysm, dissection, vasculitis or significant stenosis. Note is made of an accessory left hepatic artery. SMA: Patent without evidence of aneurysm, dissection, vasculitis or significant stenosis. Note is made of an replaced right hepatic artery. Renals: Both renal arteries are patent without evidence of aneurysm, dissection, vasculitis, fibromuscular dysplasia or significant stenosis. IMA: Patent without evidence of aneurysm, dissection, vasculitis or significant stenosis. Inflow: Patent without evidence of aneurysm, dissection, vasculitis or significant stenosis. Veins: No obvious venous abnormality within the limitations of this arterial phase study. Review of the MIP images confirms the above findings. NON-VASCULAR Hepatobiliary: No focal liver abnormality is seen. Status post cholecystectomy. No intrahepatic biliary ductal dilation. Mild extrahepatic biliary ductal dilation which tapers toward the ampulla is again identified likely representing post cholecystectomy change. Pancreas: Unremarkable Spleen: Unremarkable Adrenals/Urinary Tract: The adrenal glands are unremarkable. The kidneys are normal save for mild right cortical scarring. Small nondependent gas is seen within the bladder lumen which may relate to recent catheterization. The bladder is otherwise unremarkable. Stomach/Bowel: Surgical changes of gastric bypass are identified. The stomach, small bowel, and large bowel are otherwise unremarkable. No evidence of obstruction or focal inflammation. Appendix normal. No free intraperitoneal gas or fluid Lymphatic: No pathologic adenopathy within the abdomen and pelvis. Reproductive: Uterus and bilateral adnexa are unremarkable. Other: No no abdominal wall hernia Musculoskeletal: No acute bone abnormality. No  lytic or blastic bone lesion. Review of the MIP images confirms the above findings. IMPRESSION: 1. No acute intrathoracic or intra-abdominal pathology identified. No thoracoabdominal aortic aneurysm or dissection. 2. Surgical changes of gastric bypass and cholecystectomy. Electronically Signed   By: Helyn Numbers M.D.   On: 05/23/2023 01:02   DG Chest Portable 1 View  Result Date: 05/22/2023 CLINICAL DATA:  Chest pain and vomiting. EXAM: PORTABLE CHEST 1 VIEW COMPARISON:  March 01, 2023 FINDINGS: The heart size and mediastinal contours are within normal limits. Both lungs are clear. The visualized skeletal structures are unremarkable. IMPRESSION: No active disease. Electronically Signed   By: Aram Candela M.D.   On: 05/22/2023 23:32    PHYSICAL EXAM  Temp:  [97.9 F (36.6 C)-98.3 F (36.8 C)] 98.3 F (36.8 C) (06/10 0319) Pulse Rate:  [70-89] 77 (06/10 0950) Resp:  [12-32] 15 (06/10 0950) BP: (117-244)/(85-122) 149/95 (06/10 0950) SpO2:  [100 %] 100 % (06/10 0950) Weight:  [79.4 kg] 79.4 kg (06/09 2253)  General - Well nourished, well developed, uncomfortable due to dizziness and nausea Cardiovascular - Regular rhythm and rate.  Mental Status -  Level of arousal and orientation to time, place, and person were intact. Language including expression, naming, repetition, comprehension was assessed and found intact. Attention span and concentration were normal. Recent and remote memory were intact. Fund of Knowledge was assessed and was intact.  Cranial Nerves II - XII - II - Visual field very difficult to examine her eyes as she is squeezing them shot however does have blink to threat bilaterally III, IV, VI -on forced opening she does have a rightward gaze and will cross midline.  Complains of photosensitivity V - Facial sensation intact bilaterally. VII - Facial movement intact bilaterally. VIII - Hearing &  vestibular intact bilaterally. X - Palate elevates symmetrically. XI -  Chin turning & shoulder shrug intact bilaterally. XII - Tongue protrusion intact.  Motor Strength -generalized weakness metric throughout.  The patient's strength was normal in all extremities and pronator drift was absent.  Bulk was normal and fasciculations were absent.   Motor Tone - Muscle tone was assessed at the neck and appendages and was normal  Sensory - Light touch, temperature/pinprick were assessed and were symmetrical.    Coordination - The patient had normal movements in the hands and feet with no ataxia or dysmetria.  Tremor was absent.  Gait and Station - deferred.  ASSESSMENT/PLAN Ms. Virginia [name] is a 47 y.o. female with history of gastric bypass, bipolar disorder, anxiety and depression, uncontrolled diabetes, uncontrolled hypertension presenting with dizziness and diplopia followed by nausea and vomiting.   Likely DWI negative posterior stroke, etiology small vessel disease given uncontrolled risk factors. CT head No acute abnormality.  CTA head & neck No LVO  MRI  no acute process  May consider repeat limited MRI in 2 days 2D Echo EF 60 to 65%.  Moderate to severe left ventricular hypertrophy  LDL 86 HgbA1c 11.0 UDS pending Routine EEG normal VTE prophylaxis - Lovenox No antithrombotic prior to admission, now on clopidogrel 75 mg daily and ASA 81 DAPT for 3 weeks and then ASA alone.  Therapy recommendations:  outpt PT and HH OT Disposition:  pending  Hypertension Hypertensive urgency  Home meds:  none Unstable Gradually normalize BP in 3-5 days Long-term BP goal normotensive  Hyperlipidemia Home meds:  none LDL 86, goal < 70 Add Crestor 20mg   Continue statin at discharge  Diabetes type II UnControlled Home meds:  metformin 500mg  BID, insulin  HgbA1c 11.0, goal < 7.0 CBGs SSI Diabetes management consult Will need close outpatient follow up after discharge for better DM management   Tobacco abuse EtOH use History of drug abuse? Current  cigarette smoker about 4-5 a day  Drinks about 2 malt liquors a day Declined nicotine patch  Cessation education UDS pending  Other Stroke Risk Factors Obesity, Body mass index is 33.07 kg/m., BMI >/= 30 associated with increased stroke risk, recommend weight loss, diet and exercise as appropriate   Other Active Problems CKD stage IIIa Cre 1.26 Bipolar  S/p gastric bypass  Hospital day # 0  Gevena Mart DNP, ACNPC-AG  [facility]  ATTENDING NOTE: I reviewed above note and agree with the assessment and plan. Pt was seen and examined.   Boyfriend at the bedside. Pt lethargic lying in bed, prefer to keep eyes shut. However, able to open eyes on voice, no disconjugate gaze, denies diplopia, stated that diplopia lasted severe hours and resolved. However, not able to have left gaze, when attempting left gaze, pt has horizontal and rotational nystagmus. Visual field full. Right facial droop, tongue midline. Moving all extremities. FTN and HTS intact grossly but slow action. Sensation symmetrical. Language no aphasia but paucity of speech, following all simple commands.   Pt exam along with vertigo, N/V, transient diplopia, concerning for DWI negative stroke. Will need to repeat limited MRI in 2 days. She does have multiple uncontrolled risk factors including uncontrolled HTN, DM, polysubstance abuse. Aggressive risk factor control is the key. Gradually normalize BP in 3-5 days and treat hyperglycemia. Put on DAPT and statin. PT/OT pending. Will follow.   For detailed assessment and plan, please refer to above/below as I have made changes wherever appropriate.   Marvel Plan,  MD PhD Stroke Neurology 05/23/2023 3:59 PM  I discussed with Dr. [name]. I spent extra 30 inpatient face-to-face time with the patient and her boyfriend, more than 50% of which was spent in counseling and coordination of care, reviewing test results, images and medication, and discussing the diagnosis,  treatment plan and potential prognosis. This patient's care requiresreview of multiple databases, neurological assessment, discussion with family, other specialists and medical decision making of high complexity.       To contact Stroke Continuity provider, please refer to [email or web address]. After hours, contact General Neurology

## 2023-05-23 NOTE — ED Notes (Signed)
ED TO INPATIENT HANDOFF REPORT  ED Nurse Name and Phone #: Brett Canales [phone]  S Name/Age/Gender Virginia [name] 47 y.o. female Room/Bed: [phone]  Code Status   Code Status: Full Code  Home/SNF/Other Home Patient oriented to: self, place, time, and situation Is this baseline? Yes   Triage Complete: Triage complete  Chief Complaint Dizziness [R42]  Triage Note Pt here via [facility] from home for emesis and chest pain. States L sided chest pain that radiates to L jaw and epigastric pain.  Pt also c/o episodes of lethargy and dizziness.  Diabetic, though unable to take insulin today.  CBG 357. BP 230/110.  EKG RBB with peaked T waves.  Given 400 LR 4 mg zofran 324 asa 1 sl nitro (that reduced the chest pain)   Allergies Allergies  Allergen Reactions   Ace Inhibitors Nausea And Vomiting and Cough   Banana Nausea And Vomiting   Lamictal [Lamotrigine] Nausea And Vomiting   Nsaids Other (See Comments)    Kidney failure   Topiramate Nausea And Vomiting    Level of Care/Admitting Diagnosis ED Disposition     ED Disposition  Admit   Condition  --   Comment  Hospital [facility] [[id number]]  Level of Care: Telemetry Medical [104]  May place patient in observation at [facility] or [facility] if equivalent level of care is available:: No  Covid Evaluation: Asymptomatic - no recent exposure (last 10 days) testing not required  Diagnosis: Dizziness [914782]  Admitting Physician: Darlin Drop [[id number]]  Attending Physician: Darlin Drop [[id number]]          B Medical/Surgery History Past Medical History:  Diagnosis Date   Anxiety    Arthritis    Bipolar affective (HCC)    Depression    Diabetes mellitus    Hypertension    Renal disorder    Past Surgical History:  Procedure Laterality Date   CESAREAN SECTION     CHOLECYSTECTOMY     GASTRIC BYPASS     KNEE SURGERY       A IV Location/Drains/Wounds Patient Lines/Drains/Airways  Status     Active Line/Drains/Airways     Name Placement date Placement time Site Days   Peripheral IV 05/22/23 20 G Left;Posterior Hand 05/22/23  --  Hand  1   Peripheral IV 05/23/23 20 G Anterior;Proximal;Right Forearm 05/23/23  0002  Forearm  less than 1            Intake/Output Last 24 hours No intake or output data in the 24 hours ending 05/23/23 1552  Labs/Imaging Results for orders placed or performed during the hospital encounter of 05/22/23 (from the past 48 hour(s))  CBG monitoring, ED     Status: Abnormal   Collection Time: 05/22/23 10:53 PM  Result Value Ref Range   Glucose-Capillary 313 (H) 70 - 99 mg/dL    Comment: Glucose reference range applies only to samples taken after fasting for at least 8 hours.  CBC     Status: None   Collection Time: 05/22/23 11:00 PM  Result Value Ref Range   WBC 10.3 4.0 - 10.5 K/uL   RBC 4.22 3.87 - 5.11 MIL/uL   Hemoglobin 12.1 12.0 - 15.0 g/dL   HCT 96.2 95.2 - 84.1 %   MCV 89.1 80.0 - 100.0 fL   MCH 28.7 26.0 - 34.0 pg   MCHC 32.2 30.0 - 36.0 g/dL   RDW 32.4 40.1 - 02.7 %   Platelets 232 150 -  400 K/uL    Comment: REPEATED TO VERIFY   nRBC 0.0 0.0 - 0.2 %    Comment: Performed at [facility], [address], [place], [place] [address]  Troponin I (High Sensitivity)     Status: None   Collection Time: 05/22/23 11:00 PM  Result Value Ref Range   Troponin I (High Sensitivity) 6 <18 ng/L    Comment: (NOTE) Elevated high sensitivity troponin I (hsTnI) values and significant  changes across serial measurements may suggest ACS but many other  chronic and acute conditions are known to elevate hsTnI results.  Refer to the "Links" section for chest pain algorithms and additional  guidance. Performed at [facility], [address] [place], [place] [address]   Comprehensive metabolic panel     Status: Abnormal   Collection Time: 05/22/23 11:00 PM  Result Value Ref Range   Sodium 133 (L) 135 - 145 mmol/L    Potassium 4.9 3.5 - 5.1 mmol/L   Chloride 105 98 - 111 mmol/L   CO2 18 (L) 22 - 32 mmol/L   Glucose, Bld 334 (H) 70 - 99 mg/dL    Comment: Glucose reference range applies only to samples taken after fasting for at least 8 hours.   BUN 17 6 - 20 mg/dL   Creatinine, Ser 0.98 (H) 0.44 - 1.00 mg/dL   Calcium 8.1 (L) 8.9 - 10.3 mg/dL   Total Protein 6.8 6.5 - 8.1 g/dL   Albumin 2.9 (L) 3.5 - 5.0 g/dL   AST 23 15 - 41 U/L   ALT 24 0 - 44 U/L   Alkaline Phosphatase 98 38 - 126 U/L   Total Bilirubin 1.0 0.3 - 1.2 mg/dL   GFR, Estimated 59 (L) >60 mL/min    Comment: (NOTE) Calculated using the CKD-EPI Creatinine Equation (2021)    Anion gap 10 5 - 15    Comment: Performed at [facility], [address], [place], [place] [address]  Lactic acid, plasma     Status: Abnormal   Collection Time: 05/22/23 11:00 PM  Result Value Ref Range   Lactic Acid, Venous 2.1 (HH) 0.5 - 1.9 mmol/L    Comment: CRITICAL RESULT CALLED TO, READ BACK BY AND VERIFIED WITH C. CHRISCO, RN, 2344, 05/22/23, EADEDOKUN Performed at [facility], [address], [place], [place] [address]   Lipase, blood     Status: None   Collection Time: 05/22/23 11:00 PM  Result Value Ref Range   Lipase 25 11 - 51 U/L    Comment: Performed at [facility], [address], [place], [place] [address]  I-Stat beta hCG blood, ED     Status: Abnormal   Collection Time: 05/22/23 11:01 PM  Result Value Ref Range   I-stat hCG, quantitative 5.6 (H) <5 mIU/mL   Comment 3            Comment:   GEST. AGE      CONC.  (mIU/mL)   <=1 WEEK        5 - 50     2 WEEKS       50 - 500     3 WEEKS       100 - 10,000     4 WEEKS     1,000 - 30,000        FEMALE AND NON-PREGNANT FEMALE:     LESS THAN 5 mIU/mL   I-Stat venous blood gas, ED  Status: Abnormal   Collection Time: 05/23/23 12:05 AM  Result Value Ref Range   pH, Ven 7.394 7.25 - 7.43   pCO2, Ven 33.6 (L) 44 - 60 mmHg   pO2, Ven 71 (H) 32 - 45 mmHg   Bicarbonate 20.5  20.0 - 28.0 mmol/L   TCO2 22 22 - 32 mmol/L   O2 Saturation 94 %   Acid-base deficit 4.0 (H) 0.0 - 2.0 mmol/L   Sodium 125 (L) 135 - 145 mmol/L   Potassium >8.5 (HH) 3.5 - 5.1 mmol/L   Calcium, Ion 0.84 (LL) 1.15 - 1.40 mmol/L   HCT 40.0 36.0 - 46.0 %   Hemoglobin 13.6 12.0 - 15.0 g/dL   Sample type VENOUS    Comment NOTIFIED PHYSICIAN   POC CBG, ED     Status: Abnormal   Collection Time: 05/23/23  1:09 AM  Result Value Ref Range   Glucose-Capillary 296 (H) 70 - 99 mg/dL    Comment: Glucose reference range applies only to samples taken after fasting for at least 8 hours.  Lactic acid, plasma     Status: None   Collection Time: 05/23/23  1:56 AM  Result Value Ref Range   Lactic Acid, Venous 1.8 0.5 - 1.9 mmol/L    Comment: Performed at [facility], [address] [place], [place] [address]  Troponin I (High Sensitivity)     Status: None   Collection Time: 05/23/23  1:56 AM  Result Value Ref Range   Troponin I (High Sensitivity) 6 <18 ng/L    Comment: (NOTE) Elevated high sensitivity troponin I (hsTnI) values and significant  changes across serial measurements may suggest ACS but many other  chronic and acute conditions are known to elevate hsTnI results.  Refer to the "Links" section for chest pain algorithms and additional  guidance. Performed at [facility], [address] [place], [place] [address]   Lactic acid, plasma     Status: Abnormal   Collection Time: 05/23/23  4:29 AM  Result Value Ref Range   Lactic Acid, Venous 2.3 (HH) 0.5 - 1.9 mmol/L    Comment: CRITICAL RESULT CALLED TO, READ BACK BY AND VERIFIED WITH Rolla Etienne, RN, [phone], 05/23/23, EADEDOKUN Performed at [facility], [address] [place], [place] [address]   CBG monitoring, ED     Status: Abnormal   Collection Time: 05/23/23  5:40 AM  Result Value Ref Range   Glucose-Capillary 333 (H) 70 - 99 mg/dL    Comment: Glucose reference range applies only to samples taken after fasting  for at least 8 hours.  Basic metabolic panel     Status: Abnormal   Collection Time: 05/23/23  6:35 AM  Result Value Ref Range   Sodium 131 (L) 135 - 145 mmol/L   Potassium 5.4 (H) 3.5 - 5.1 mmol/L    Comment: HEMOLYSIS AT THIS LEVEL MAY AFFECT RESULT   Chloride 103 98 - 111 mmol/L   CO2 16 (L) 22 - 32 mmol/L   Glucose, Bld 343 (H) 70 - 99 mg/dL    Comment: Glucose reference range applies only to samples taken after fasting for at least 8 hours.   BUN 18 6 - 20 mg/dL   Creatinine, Ser 8.29 (H) 0.44 - 1.00 mg/dL   Calcium 7.9 (L) 8.9 - 10.3 mg/dL   GFR, Estimated 54 (L) >60 mL/min    Comment: (NOTE) Calculated using the CKD-EPI Creatinine Equation (2021)    Anion gap 12  5 - 15    Comment: Performed at [facility], [address], [place], [place] [address]  CBC     Status: Abnormal   Collection Time: 05/23/23  6:35 AM  Result Value Ref Range   WBC 12.7 (H) 4.0 - 10.5 K/uL   RBC 4.14 3.87 - 5.11 MIL/uL   Hemoglobin 11.9 (L) 12.0 - 15.0 g/dL   HCT 67.8 93.8 - 10.1 %   MCV 88.4 80.0 - 100.0 fL   MCH 28.7 26.0 - 34.0 pg   MCHC 32.5 30.0 - 36.0 g/dL   RDW 75.1 02.5 - 85.2 %   Platelets 245 150 - 400 K/uL    Comment: REPEATED TO VERIFY   nRBC 0.0 0.0 - 0.2 %    Comment: Performed at [facility], [address], [place], [place] [address]  Hemoglobin A1c     Status: Abnormal   Collection Time: 05/23/23  6:35 AM  Result Value Ref Range   Hgb A1c MFr Bld 11.0 (H) 4.8 - 5.6 %    Comment: (NOTE) Pre diabetes:          5.7%-6.4%  Diabetes:              >6.4%  Glycemic control for   <7.0% adults with diabetes    Mean Plasma Glucose 269 mg/dL    Comment: Performed at [facility], [address], [place], [place] [address]  Lipid panel     Status: None   Collection Time: 05/23/23  6:35 AM  Result Value Ref Range   Cholesterol 181 0 - 200 mg/dL   Triglycerides 71 <144 mg/dL   HDL 81 >31 mg/dL   Total CHOL/HDL Ratio 2.2 RATIO   VLDL 14 0 - 40 mg/dL   LDL  Cholesterol 86 0 - 99 mg/dL    Comment:        Total Cholesterol/HDL:CHD Risk Coronary Heart Disease Risk Table                     Men   Women  1/2 Average Risk   3.4   3.3  Average Risk       5.0   4.4  2 X Average Risk   9.6   7.1  3 X Average Risk  23.4   11.0        Use the calculated Patient Ratio above and the CHD Risk Table to determine the patient's CHD Risk.        ATP III CLASSIFICATION (LDL):  <100     mg/dL   Optimal  540-086  mg/dL   Near or Above                    Optimal  130-159  mg/dL   Borderline  761-950  mg/dL   High  >932     mg/dL   Very High Performed at [facility], [address] [place], [place] [address]   CBG monitoring, ED     Status: Abnormal   Collection Time: 05/23/23  6:45 AM  Result Value Ref Range   Glucose-Capillary 343 (H) 70 - 99 mg/dL    Comment: Glucose reference range applies only to samples taken after fasting for at least 8 hours.  CBG monitoring, ED     Status: Abnormal   Collection Time: 05/23/23  8:21 AM  Result Value Ref Range   Glucose-Capillary 317 (H) 70 - 99 mg/dL  Comment: Glucose reference range applies only to samples taken after fasting for at least 8 hours.  CBG monitoring, ED     Status: Abnormal   Collection Time: 05/23/23 11:34 AM  Result Value Ref Range   Glucose-Capillary 193 (H) 70 - 99 mg/dL    Comment: Glucose reference range applies only to samples taken after fasting for at least 8 hours.   ECHOCARDIOGRAM COMPLETE  Result Date: 05/23/2023    ECHOCARDIOGRAM REPORT   Patient Name:   Virginia [name] [facility] Date of Exam: 05/23/2023 Medical Rec #:  [id number]             Height:       61.0 in Accession #:    [id number]            Weight:       175.0 lb Date of Birth:  July 15, 1976              BSA:          1.785 m Patient Age:    46 years              BP:           149/95 mmHg Patient Gender: F                     HR:           74 bpm. Exam Location:  Inpatient Procedure: 2D Echo, Cardiac Doppler and Color  Doppler Indications:    CVA  History:        Patient has prior history of Echocardiogram examinations, most                 recent 03/02/2023. Risk Factors:Hypertension, Diabetes and                 Dyslipidemia.  Sonographer:    Melissa Morford RDCS (AE, PE) Referring Phys: [id number] CAROLE N HALL  Sonographer Comments: No subcostal window. IMPRESSIONS  1. Left ventricular ejection fraction, by estimation, is 60 to 65%. The left ventricle has normal function. The left ventricle has no regional wall motion abnormalities. There is moderate to severe concentric left ventricular hypertrophy. Left ventricular diastolic parameters are consistent with Grade I diastolic dysfunction (impaired relaxation).  2. Right ventricular systolic function is normal. The right ventricular size is normal.  3. The mitral valve is normal in structure. No evidence of mitral valve regurgitation.  4. The aortic valve is tricuspid. Aortic valve regurgitation is not visualized. No aortic stenosis is present.  5. Given moderate to severe LVH, consider CMR to assess for infiltrative cardiomyopathy (was suggested on prior study as well). Comparison(s): No significant change from prior study. Conclusion(s)/Recommendation(s): No intracardiac source of embolism detected on this transthoracic study. Consider a transesophageal echocardiogram to exclude cardiac source of embolism if clinically indicated. FINDINGS  Left Ventricle: Left ventricular ejection fraction, by estimation, is 60 to 65%. The left ventricle has normal function. The left ventricle has no regional wall motion abnormalities. The left ventricular internal cavity size was normal in size. There is  moderate to severe concentric left ventricular hypertrophy. Left ventricular diastolic parameters are consistent with Grade I diastolic dysfunction (impaired relaxation). Right Ventricle: The right ventricular size is normal. No increase in right ventricular wall thickness. Right ventricular  systolic function is normal. Left Atrium: Left atrial size was normal in size. Right Atrium: Right atrial size was normal in size. Pericardium: There is no evidence of pericardial effusion. Mitral Valve:  The mitral valve is normal in structure. No evidence of mitral valve regurgitation. Tricuspid Valve: The tricuspid valve is normal in structure. Tricuspid valve regurgitation is trivial. Aortic Valve: The aortic valve is tricuspid. Aortic valve regurgitation is not visualized. No aortic stenosis is present. Pulmonic Valve: The pulmonic valve was normal in structure. Pulmonic valve regurgitation is trivial. Aorta: The aortic root is normal in size and structure. Venous: The inferior vena cava was not well visualized. IAS/Shunts: The atrial septum is grossly normal.  LEFT VENTRICLE PLAX 2D LVIDd:         3.90 cm   Diastology LVIDs:         2.70 cm   LV e' medial:    4.79 cm/s LV PW:         1.10 cm   LV E/e' medial:  15.3 LV IVS:        0.90 cm   LV e' lateral:   5.87 cm/s LVOT diam:     2.00 cm   LV E/e' lateral: 12.5 LV SV:         64 LV SV Index:   36 LVOT Area:     3.14 cm  RIGHT VENTRICLE RV S prime:     9.68 cm/s TAPSE (M-mode): 2.3 cm LEFT ATRIUM             Index        RIGHT ATRIUM           Index LA diam:        4.00 cm 2.24 cm/m   RA Area:     12.70 cm LA Vol (A2C):   51.8 ml 29.02 ml/m  RA Volume:   29.20 ml  16.36 ml/m LA Vol (A4C):   33.0 ml 18.49 ml/m LA Biplane Vol: 44.0 ml 24.65 ml/m  AORTIC VALVE LVOT Vmax:   89.50 cm/s LVOT Vmean:  68.500 cm/s LVOT VTI:    0.204 m  AORTA Ao Root diam: 2.40 cm MITRAL VALVE               TRICUSPID VALVE MV Area (PHT): 3.93 cm    TR Peak grad:   13.8 mmHg MV E velocity: 73.50 cm/s  TR Vmax:        186.00 cm/s MV A velocity: 96.10 cm/s MV E/A ratio:  0.76        SHUNTS                            Systemic VTI:  0.20 m                            Systemic Diam: 2.00 cm Laurance Flatten MD Electronically signed by Laurance Flatten MD Signature Date/Time:  05/23/2023/12:33:46 PM    Final    EEG adult  Result Date: 05/23/2023 Charlsie Quest, MD     05/23/2023  1:10 PM Patient Name: Virginia [name] MRN: [id number] Epilepsy Attending: Charlsie Quest Referring Physician/Provider: Osvaldo Shipper, MD Date: 05/23/2023 Duration: 23.42 mins Patient history: 47yo F with dizziness getting eeg to evaluate for seizure. Level of alertness: Awake, asleep AEDs during EEG study: None Technical aspects: This EEG study was done with scalp electrodes positioned according to the 10-20 International system of electrode placement. Electrical activity was reviewed with band pass filter of 1-70Hz , sensitivity of 7 uV/mm, display speed of 26mm/sec with a 60Hz  notched filter applied as appropriate.  EEG data were recorded continuously and digitally stored.  Video monitoring was available and reviewed as appropriate. Description: The posterior dominant rhythm consists of 8-9 Hz activity of moderate voltage (25-35 uV) seen predominantly in posterior head regions, symmetric and reactive to eye opening and eye closing. Sleep was characterized by vertex waves, sleep spindles (12 to 14 Hz), maximal frontocentral region. Hyperventilation and photic stimulation were not performed.   IMPRESSION: This study is within normal limits. No seizures or epileptiform discharges were seen throughout the recording. A normal interictal EEG does not exclude the diagnosis of epilepsy. Charlsie Quest   MR BRAIN WO CONTRAST  Result Date: 05/23/2023 CLINICAL DATA:  Neuro deficit, acute, stroke suspected diplopia, right eye will not cross midline, dizziness. EXAM: MRI HEAD WITHOUT CONTRAST TECHNIQUE: Multiplanar, multiecho pulse sequences of the brain and surrounding structures were obtained without intravenous contrast. COMPARISON:  CTA head/neck 05/23/2023. FINDINGS: Brain: Motion degraded study. Within this limitation, no acute infarct or hemorrhage. Mild chronic small-vessel disease. No mass or  midline shift. No hydrocephalus or extra-axial collection. No abnormal susceptibility. Vascular: Normal flow voids. Skull and upper cervical spine: Normal marrow signal. Sinuses/Orbits: Unremarkable. Other: None. IMPRESSION: Motion degraded study. Within this limitation, no acute intracranial process. Electronically Signed   By: Orvan Falconer M.D.   On: 05/23/2023 09:39   CT ANGIO HEAD NECK W WO CM  Result Date: 05/23/2023 CLINICAL DATA:  Initial evaluation for mental status change. Concern for stroke. EXAM: CT ANGIOGRAPHY HEAD AND NECK WITH AND WITHOUT CONTRAST TECHNIQUE: Multidetector CT imaging of the head and neck was performed using the standard protocol during bolus administration of intravenous contrast. Multiplanar CT image reconstructions and MIPs were obtained to evaluate the vascular anatomy. Carotid stenosis measurements (when applicable) are obtained utilizing NASCET criteria, using the distal internal carotid diameter as the denominator. RADIATION DOSE REDUCTION: This exam was performed according to the departmental dose-optimization program which includes automated exposure control, adjustment of the mA and/or kV according to patient size and/or use of iterative reconstruction technique. CONTRAST:  60mL OMNIPAQUE IOHEXOL 350 MG/ML SOLN COMPARISON:  Prior study from 04/18/2018. FINDINGS: CT HEAD FINDINGS Brain: Some contrast material remains on board from CTA performed earlier the same day. Cerebral volume within normal limits for patient age. No evidence for acute intracranial hemorrhage. No findings to suggest acute large vessel territory infarct. No mass lesion, midline shift, or mass effect. Ventricles are normal in size without evidence for hydrocephalus. No extra-axial fluid collection identified. Vascular: Contrast material seen throughout the intracranial vasculature. Skull: Scalp soft tissues demonstrate no acute abnormality. Calvarium intact. Sinuses/Orbits: Globes and orbital soft  tissues within normal limits. Visualized paranasal sinuses are clear. No mastoid effusion. CTA NECK FINDINGS Aortic arch: Standard branching. Imaged portion shows no evidence of aneurysm or dissection. No significant stenosis of the major arch vessel origins. Right carotid system: No evidence of dissection, stenosis (50% or greater), or occlusion. Left carotid system: No evidence of dissection, stenosis (50% or greater), or occlusion. Vertebral arteries: Right vertebral artery dominant. Left vertebral artery hypoplastic and arises directly from the aortic arch. No dissection or stenosis. Skeleton: No discrete or worrisome osseous lesions. Other neck: No other acute finding within the neck. Upper chest: No acute finding within the visualized upper chest. Review of the MIP images confirms the above findings CTA HEAD FINDINGS Anterior circulation: Both internal carotid arteries are widely patent through the siphons without stenosis or other abnormality. Right A1 segment widely patent. Left A1 hypoplastic and/or absent. Tiny 1-2  mm outpouching arising from the anterior communicating canning artery complex felt to be most consistent with a small vascular infundibulum, likely related to a hypoplastic left A1 segment (series 11, image 109). Both ACAs widely patent distally without stenosis. No M1 stenosis or occlusion. No proximal MCA branch occlusion or high-grade stenosis. Distal MCA branches perfused and symmetric. Posterior circulation: Dominant right V4 segment widely patent. Left V4 segment hypoplastic and patent as well. Both PICA patent at their origins. Basilar patent without stenosis. Superior cerebral arteries patent bilaterally. Right PCA supplied via the basilar. Fetal type origin of the left PCA. Both PCAs are widely patent to their distal aspects without stenosis. Venous sinuses: Patent allowing for timing the contrast bolus. Anatomic variants: As above.  No aneurysm. Review of the MIP images confirms the  above findings IMPRESSION: 1. Normal CTA of the head and neck. No large vessel occlusion or other emergent finding. No hemodynamically significant or correctable stenosis. 2. No other acute intracranial abnormality. Electronically Signed   By: Rise Mu M.D.   On: 05/23/2023 03:42   CT Angio Chest/Abd/Pel for Dissection W and/or W/WO  Result Date: 05/23/2023 CLINICAL DATA:  Acute aortic syndrome EXAM: CT ANGIOGRAPHY CHEST, ABDOMEN AND PELVIS TECHNIQUE: Non-contrast CT of the chest was initially obtained. Multidetector CT imaging through the chest, abdomen and pelvis was performed using the standard protocol during bolus administration of intravenous contrast. Multiplanar reconstructed images and MIPs were obtained and reviewed to evaluate the vascular anatomy. RADIATION DOSE REDUCTION: This exam was performed according to the departmental dose-optimization program which includes automated exposure control, adjustment of the mA and/or kV according to patient size and/or use of iterative reconstruction technique. CONTRAST:  75mL OMNIPAQUE IOHEXOL 350 MG/ML SOLN COMPARISON:  03/01/2023 FINDINGS: CTA CHEST FINDINGS Cardiovascular: Preferential opacification of the thoracic aorta. No evidence of thoracic aortic aneurysm or dissection. Normal heart size. No pericardial effusion. Mediastinum/Nodes: Visualized thyroid is unremarkable. The esophagus is patulous but is otherwise unremarkable. No pathologic adenopathy within the thorax. Lungs/Pleura: Lungs are clear. No pleural effusion or pneumothorax. Musculoskeletal: No acute bone abnormality. No lytic or blastic bone lesion. Review of the MIP images confirms the above findings. CTA ABDOMEN AND PELVIS FINDINGS VASCULAR Aorta: Normal caliber aorta without aneurysm, dissection, vasculitis or significant stenosis. Celiac: Patent without evidence of aneurysm, dissection, vasculitis or significant stenosis. Note is made of an accessory left hepatic artery. SMA:  Patent without evidence of aneurysm, dissection, vasculitis or significant stenosis. Note is made of an replaced right hepatic artery. Renals: Both renal arteries are patent without evidence of aneurysm, dissection, vasculitis, fibromuscular dysplasia or significant stenosis. IMA: Patent without evidence of aneurysm, dissection, vasculitis or significant stenosis. Inflow: Patent without evidence of aneurysm, dissection, vasculitis or significant stenosis. Veins: No obvious venous abnormality within the limitations of this arterial phase study. Review of the MIP images confirms the above findings. NON-VASCULAR Hepatobiliary: No focal liver abnormality is seen. Status post cholecystectomy. No intrahepatic biliary ductal dilation. Mild extrahepatic biliary ductal dilation which tapers toward the ampulla is again identified likely representing post cholecystectomy change. Pancreas: Unremarkable Spleen: Unremarkable Adrenals/Urinary Tract: The adrenal glands are unremarkable. The kidneys are normal save for mild right cortical scarring. Small nondependent gas is seen within the bladder lumen which may relate to recent catheterization. The bladder is otherwise unremarkable. Stomach/Bowel: Surgical changes of gastric bypass are identified. The stomach, small bowel, and large bowel are otherwise unremarkable. No evidence of obstruction or focal inflammation. Appendix normal. No free intraperitoneal gas or fluid Lymphatic: No  pathologic adenopathy within the abdomen and pelvis. Reproductive: Uterus and bilateral adnexa are unremarkable. Other: No no abdominal wall hernia Musculoskeletal: No acute bone abnormality. No lytic or blastic bone lesion. Review of the MIP images confirms the above findings. IMPRESSION: 1. No acute intrathoracic or intra-abdominal pathology identified. No thoracoabdominal aortic aneurysm or dissection. 2. Surgical changes of gastric bypass and cholecystectomy. Electronically Signed   By: Helyn Numbers M.D.   On: 05/23/2023 01:02   DG Chest Portable 1 View  Result Date: 05/22/2023 CLINICAL DATA:  Chest pain and vomiting. EXAM: PORTABLE CHEST 1 VIEW COMPARISON:  March 01, 2023 FINDINGS: The heart size and mediastinal contours are within normal limits. Both lungs are clear. The visualized skeletal structures are unremarkable. IMPRESSION: No active disease. Electronically Signed   By: Aram Candela M.D.   On: 05/22/2023 23:32    Pending Labs Unresulted Labs (From admission, onward)     Start     Ordered   05/30/23 0500  Creatinine, serum  (enoxaparin (LOVENOX)    CrCl >/= 30 ml/min)  Weekly,   R     Comments: while on enoxaparin therapy    05/23/23 0545   05/24/23 0500  CBC  Tomorrow morning,   R        05/23/23 0911   05/24/23 0500  Comprehensive metabolic panel  Tomorrow morning,   R        05/23/23 0911   05/23/23 1009  Rapid urine drug screen (hospital performed)  ONCE - STAT,   STAT        05/23/23 1008   05/23/23 0158  Lactic acid, plasma  Now then every 2 hours,   R (with STAT occurrences)      05/23/23 0158            Vitals/Pain Today's Vitals   05/23/23 1400 05/23/23 1415 05/23/23 1430 05/23/23 1440  BP: (!) 148/93  (!) 158/90 (!) 158/90  Pulse: 80 80 78 79  Resp:    14  Temp:      TempSrc:      SpO2: 100% 100% 100% 100%  Weight:      Height:      PainSc:        Isolation Precautions No active isolations  Medications Medications  clopidogrel (PLAVIX) tablet 300 mg (300 mg Oral Given 05/23/23 0551)    And  clopidogrel (PLAVIX) tablet 75 mg (has no administration in time range)  enoxaparin (LOVENOX) injection 40 mg (has no administration in time range)  acetaminophen (TYLENOL) tablet 650 mg (has no administration in time range)  prochlorperazine (COMPAZINE) injection 5 mg (5 mg Intravenous Given 05/23/23 0737)  polyethylene glycol (MIRALAX / GLYCOLAX) packet 17 g (has no administration in time range)  melatonin tablet 5 mg (has no administration  in time range)  0.9 %  sodium chloride infusion ( Intravenous New Bag/Given 05/23/23 0740)  labetalol (NORMODYNE) injection 10 mg (10 mg Intravenous Given 05/23/23 0941)  pantoprazole (PROTONIX) injection 40 mg (40 mg Intravenous Given 05/23/23 1043)  insulin aspart (novoLOG) injection 0-15 Units (3 Units Subcutaneous Given 05/23/23 1222)  insulin glargine-yfgn (SEMGLEE) injection 5 Units (5 Units Subcutaneous Given 05/23/23 1222)  sodium chloride 0.9 % bolus 1,000 mL (0 mLs Intravenous Stopped 05/23/23 0157)  HYDROmorphone (DILAUDID) injection 1 mg (1 mg Intravenous Given 05/23/23 0010)  metoCLOPramide (REGLAN) injection 10 mg (10 mg Intravenous Given 05/23/23 0008)  labetalol (NORMODYNE) injection 5 mg (5 mg Intravenous Given 05/23/23 0006)  iohexol (OMNIPAQUE) 350 MG/ML  injection 75 mL (75 mLs Intravenous Contrast Given 05/23/23 0028)  iohexol (OMNIPAQUE) 350 MG/ML injection 75 mL (60 mLs Intravenous Contrast Given 05/23/23 0253)  ondansetron (ZOFRAN) injection 4 mg (4 mg Intravenous Given 05/23/23 0403)  LORazepam (ATIVAN) injection 0.5 mg (0.5 mg Intravenous Given 05/23/23 4540)    Mobility walks with person assist     Focused Assessments Cardiac Assessment Handoff:  Cardiac Rhythm: Normal sinus rhythm Lab Results  Component Value Date   TROPONINI <0.30 10/25/2012   No results found for: "DDIMER" Does the Patient currently have chest pain? No    R Recommendations: See Admitting Provider Note  Report given to:   Additional Notes:

## 2023-05-23 NOTE — ED Notes (Signed)
Pt back to ct

## 2023-05-23 NOTE — ED Notes (Signed)
The pt s skin is cold and clammy  pulse ox is not picking up  family at   the bedside

## 2023-05-23 NOTE — Consult Note (Signed)
Neurology Consultation Reason for Consult: Disconjugate gaze Referring Physician: Madilyn Hook, E  CC: Dizziness  History is obtained from: Patient  HPI: Virginia [name] is a 47 y.o. female with a history of hypertension and diabetes who presents with dizziness and diplopia that started abruptly around noon.  She has been dizzy, nauseated and throwing up, also feeling like she is sleepy today.  She has had double vision since onset.  She states that she feels like it might be getting slightly better over the course of the day, not worsening.  She denies any numbness or weakness.   LKW: Noon  tnk given?: no, outside of window  Past Medical History:  Diagnosis Date   Anxiety    Arthritis    Bipolar affective (HCC)    Depression    Diabetes mellitus    Hypertension    Renal disorder      Family History  Problem Relation Age of Onset   Asthma Mother    Hypertension Mother    Hypertension Father    Diabetes Mellitus II Father      Social History:  reports that she quit smoking about 10 years ago. Her smoking use included cigarettes. She smoked an average of .5 packs per day. She has never used smokeless tobacco. She reports current alcohol use. She reports that she does not use drugs.   Exam: Current vital signs: BP (!) 188/108   Pulse 86   Temp 98.3 F (36.8 C)   Resp (!) 32   Ht 5\' 1"  (1.549 m)   Wt 79.4 kg   SpO2 100%   BMI 33.07 kg/m  Vital signs in last 24 hours: Temp:  [97.9 F (36.6 C)-98.3 F (36.8 C)] 98.3 F (36.8 C) (06/10 0319) Pulse Rate:  [86] 86 (06/10 0230) Resp:  [12-32] 32 (06/10 0230) BP: (166-244)/(85-122) 188/108 (06/10 0215) SpO2:  [100 %] 100 % (06/10 0230) Weight:  [79.4 kg] 79.4 kg (06/09 2253)   Physical Exam  Appears well-developed and well-nourished.   Neuro: Mental Status: Patient is awake, alert, oriented to person, place, month, year, and situation. Patient is able to give a clear and coherent history. No signs of aphasia or  neglect Cranial Nerves: II: Visual Fields are full. Pupils are equal, round, and reactive to light.   III,IV, VI: She has dysconjugate gaze at rest, impaired abduction of the left eye V: Facial sensation is symmetric to temperature VII: Facial movement with right facial weakness VIII: hearing is intact to voice X: Uvula elevates symmetrically XI: Shoulder shrug is symmetric. XII: tongue is midline without atrophy or fasciculations.  Motor: Tone is normal. Bulk is normal. 5/5 strength was present in all four extremities.  Sensory: Sensation is symmetric to light touch and temperature in the arms and legs. Cerebellar: FNF and HKS are intact bilaterally   I have reviewed labs in epic and the results pertinent to this consultation are: Creatinine 1.17  I have reviewed the images obtained: CT/CTA-negative  Impression: 46 year old female with abrupt onset disconjugate gaze, right facial weakness, vertigo.  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  She will need admission for secondary risk factor modification as well as physical therapy.  Recommendations: - HgbA1c, fasting lipid panel - MRI of the brain without contrast - Frequent neuro checks - Echocardiogram - Prophylactic therapy-Antiplatelet med: plavix 75mg  daily  after 300mg  load and if aspirin is okay from a renal perspective, would add 81 mg of  aspirin daily for 3 weeks - Risk factor modification - Telemetry monitoring - PT consult, OT consult, Speech consult - Stroke team to follow    Ritta Slot, MD [facility] [phone]  If 7pm- 7am, please page neurology on call as listed in AMION.

## 2023-05-23 NOTE — Procedures (Signed)
Patient Name: Virginia [name]  MRN: [id number]  Epilepsy Attending: Charlsie Quest  Referring Physician/Provider: Osvaldo Shipper, MD  Date: 05/23/2023 Duration: 23.42 mins  Patient history: 47yo F with dizziness getting eeg to evaluate for seizure.  Level of alertness: Awake, asleep  AEDs during EEG study: None  Technical aspects: This EEG study was done with scalp electrodes positioned according to the 10-20 International system of electrode placement. Electrical activity was reviewed with band pass filter of 1-70Hz , sensitivity of 7 uV/mm, display speed of 22mm/sec with a 60Hz  notched filter applied as appropriate. EEG data were recorded continuously and digitally stored.  Video monitoring was available and reviewed as appropriate.  Description: The posterior dominant rhythm consists of 8-9 Hz activity of moderate voltage (25-35 uV) seen predominantly in posterior head regions, symmetric and reactive to eye opening and eye closing. Sleep was characterized by vertex waves, sleep spindles (12 to 14 Hz), maximal frontocentral region. Hyperventilation and photic stimulation were not performed.     IMPRESSION: This study is within normal limits. No seizures or epileptiform discharges were seen throughout the recording.  A normal interictal EEG does not exclude the diagnosis of epilepsy.  Virginia [name]

## 2023-05-24 ENCOUNTER — Observation Stay (HOSPITAL_COMMUNITY): Payer: Medicaid Other

## 2023-05-24 DIAGNOSIS — Z9884 Bariatric surgery status: Secondary | ICD-10-CM | POA: Diagnosis not present

## 2023-05-24 DIAGNOSIS — E1165 Type 2 diabetes mellitus with hyperglycemia: Secondary | ICD-10-CM | POA: Diagnosis not present

## 2023-05-24 DIAGNOSIS — E785 Hyperlipidemia, unspecified: Secondary | ICD-10-CM | POA: Diagnosis not present

## 2023-05-24 DIAGNOSIS — R300 Dysuria: Secondary | ICD-10-CM | POA: Diagnosis not present

## 2023-05-24 DIAGNOSIS — I6350 Cerebral infarction due to unspecified occlusion or stenosis of unspecified cerebral artery: Secondary | ICD-10-CM | POA: Diagnosis not present

## 2023-05-24 DIAGNOSIS — F419 Anxiety disorder, unspecified: Secondary | ICD-10-CM | POA: Diagnosis not present

## 2023-05-24 DIAGNOSIS — I639 Cerebral infarction, unspecified: Principal | ICD-10-CM | POA: Diagnosis present

## 2023-05-24 DIAGNOSIS — E669 Obesity, unspecified: Secondary | ICD-10-CM | POA: Diagnosis not present

## 2023-05-24 DIAGNOSIS — Z9049 Acquired absence of other specified parts of digestive tract: Secondary | ICD-10-CM | POA: Diagnosis not present

## 2023-05-24 DIAGNOSIS — R1110 Vomiting, unspecified: Secondary | ICD-10-CM | POA: Diagnosis not present

## 2023-05-24 DIAGNOSIS — I10 Essential (primary) hypertension: Secondary | ICD-10-CM

## 2023-05-24 DIAGNOSIS — R29700 NIHSS score 0: Secondary | ICD-10-CM | POA: Diagnosis not present

## 2023-05-24 DIAGNOSIS — E1122 Type 2 diabetes mellitus with diabetic chronic kidney disease: Secondary | ICD-10-CM | POA: Diagnosis not present

## 2023-05-24 DIAGNOSIS — Z79899 Other long term (current) drug therapy: Secondary | ICD-10-CM | POA: Diagnosis not present

## 2023-05-24 DIAGNOSIS — I160 Hypertensive urgency: Secondary | ICD-10-CM | POA: Diagnosis not present

## 2023-05-24 DIAGNOSIS — R42 Dizziness and giddiness: Secondary | ICD-10-CM | POA: Diagnosis not present

## 2023-05-24 DIAGNOSIS — F17210 Nicotine dependence, cigarettes, uncomplicated: Secondary | ICD-10-CM | POA: Diagnosis not present

## 2023-05-24 DIAGNOSIS — Z7951 Long term (current) use of inhaled steroids: Secondary | ICD-10-CM | POA: Diagnosis not present

## 2023-05-24 DIAGNOSIS — Z794 Long term (current) use of insulin: Secondary | ICD-10-CM | POA: Diagnosis not present

## 2023-05-24 DIAGNOSIS — R112 Nausea with vomiting, unspecified: Secondary | ICD-10-CM | POA: Diagnosis not present

## 2023-05-24 DIAGNOSIS — M314 Aortic arch syndrome [Takayasu]: Secondary | ICD-10-CM | POA: Diagnosis not present

## 2023-05-24 DIAGNOSIS — F319 Bipolar disorder, unspecified: Secondary | ICD-10-CM | POA: Diagnosis not present

## 2023-05-24 DIAGNOSIS — E875 Hyperkalemia: Secondary | ICD-10-CM | POA: Diagnosis not present

## 2023-05-24 DIAGNOSIS — I129 Hypertensive chronic kidney disease with stage 1 through stage 4 chronic kidney disease, or unspecified chronic kidney disease: Secondary | ICD-10-CM | POA: Diagnosis not present

## 2023-05-24 DIAGNOSIS — H532 Diplopia: Secondary | ICD-10-CM | POA: Diagnosis not present

## 2023-05-24 DIAGNOSIS — N1831 Chronic kidney disease, stage 3a: Secondary | ICD-10-CM | POA: Diagnosis not present

## 2023-05-24 DIAGNOSIS — Z0389 Encounter for observation for other suspected diseases and conditions ruled out: Secondary | ICD-10-CM | POA: Diagnosis not present

## 2023-05-24 DIAGNOSIS — R29810 Facial weakness: Secondary | ICD-10-CM | POA: Diagnosis not present

## 2023-05-24 DIAGNOSIS — Z6833 Body mass index (BMI) 33.0-33.9, adult: Secondary | ICD-10-CM | POA: Diagnosis not present

## 2023-05-24 DIAGNOSIS — Z7984 Long term (current) use of oral hypoglycemic drugs: Secondary | ICD-10-CM | POA: Diagnosis not present

## 2023-05-24 LAB — GLUCOSE, CAPILLARY
Glucose-Capillary: 107 mg/dL — ABNORMAL HIGH (ref 70–99)
Glucose-Capillary: 131 mg/dL — ABNORMAL HIGH (ref 70–99)
Glucose-Capillary: 137 mg/dL — ABNORMAL HIGH (ref 70–99)
Glucose-Capillary: 179 mg/dL — ABNORMAL HIGH (ref 70–99)
Glucose-Capillary: 193 mg/dL — ABNORMAL HIGH (ref 70–99)
Glucose-Capillary: 346 mg/dL — ABNORMAL HIGH (ref 70–99)
Glucose-Capillary: 73 mg/dL (ref 70–99)

## 2023-05-24 LAB — COMPREHENSIVE METABOLIC PANEL
ALT: 24 U/L (ref 0–44)
AST: 26 U/L (ref 15–41)
Albumin: 2.6 g/dL — ABNORMAL LOW (ref 3.5–5.0)
Alkaline Phosphatase: 87 U/L (ref 38–126)
Anion gap: 11 (ref 5–15)
BUN: 20 mg/dL (ref 6–20)
CO2: 20 mmol/L — ABNORMAL LOW (ref 22–32)
Calcium: 7.8 mg/dL — ABNORMAL LOW (ref 8.9–10.3)
Chloride: 107 mmol/L (ref 98–111)
Creatinine, Ser: 1.59 mg/dL — ABNORMAL HIGH (ref 0.44–1.00)
GFR, Estimated: 41 mL/min — ABNORMAL LOW (ref >60)
Glucose, Bld: 102 mg/dL — ABNORMAL HIGH (ref 70–99)
Potassium: 3.6 mmol/L (ref 3.5–5.1)
Sodium: 138 mmol/L (ref 135–145)
Total Bilirubin: 1 mg/dL (ref 0.3–1.2)
Total Protein: 6.4 g/dL — ABNORMAL LOW (ref 6.5–8.1)

## 2023-05-24 LAB — CBC
HCT: 35.3 % — ABNORMAL LOW (ref 36.0–46.0)
Hemoglobin: 11.6 g/dL — ABNORMAL LOW (ref 12.0–15.0)
MCH: 28.9 pg (ref 26.0–34.0)
MCHC: 32.9 g/dL (ref 30.0–36.0)
MCV: 87.8 fL (ref 80.0–100.0)
Platelets: 232 10*3/uL (ref 150–400)
RBC: 4.02 MIL/uL (ref 3.87–5.11)
RDW: 12.6 % (ref 11.5–15.5)
WBC: 11.4 10*3/uL — ABNORMAL HIGH (ref 4.0–10.5)
nRBC: 0 % (ref 0.0–0.2)

## 2023-05-24 LAB — URINALYSIS, ROUTINE W REFLEX MICROSCOPIC
Bilirubin Urine: NEGATIVE
Glucose, UA: >=500 mg/dL — AB
Ketones, ur: NEGATIVE mg/dL
Nitrite: NEGATIVE
Protein, ur: 100 mg/dL — AB
Specific Gravity, Urine: 1.011 (ref 1.005–1.030)
pH: 5 (ref 5.0–8.0)

## 2023-05-24 MED ORDER — ALPRAZOLAM 0.25 MG PO TABS
0.2500 mg | ORAL_TABLET | Freq: Two times a day (BID) | ORAL | Status: DC | PRN
  Administered 2023-05-24 – 2023-05-25 (×2): 0.25 mg via ORAL
  Filled 2023-05-24 (×3): qty 1

## 2023-05-24 MED ORDER — DIPHENHYDRAMINE HCL 25 MG PO CAPS
25.0000 mg | ORAL_CAPSULE | Freq: Once | ORAL | Status: DC | PRN
  Filled 2023-05-24: qty 1

## 2023-05-24 MED ORDER — SODIUM CHLORIDE 0.9 % IV SOLN: INTRAVENOUS | Status: AC

## 2023-05-24 MED ORDER — LOPERAMIDE HCL 2 MG PO CAPS: 4.0000 mg | ORAL_CAPSULE | Freq: Three times a day (TID) | ORAL | Status: DC | PRN

## 2023-05-24 MED ORDER — INSULIN ASPART 100 UNIT/ML IJ SOLN
0.0000 [IU] | Freq: Three times a day (TID) | INTRAMUSCULAR | Status: DC
  Administered 2023-05-25: 3 [IU] via SUBCUTANEOUS
  Administered 2023-05-25: 7 [IU] via SUBCUTANEOUS

## 2023-05-24 MED ORDER — SODIUM CHLORIDE 0.9 % IV SOLN
1.0000 g | INTRAVENOUS | Status: DC
  Administered 2023-05-24: 1 g via INTRAVENOUS
  Filled 2023-05-24: qty 10

## 2023-05-24 MED ORDER — AMLODIPINE BESYLATE 10 MG PO TABS
10.0000 mg | ORAL_TABLET | Freq: Every day | ORAL | Status: DC
  Administered 2023-05-24 – 2023-05-25 (×2): 10 mg via ORAL
  Filled 2023-05-24 (×2): qty 1

## 2023-05-24 MED ORDER — INSULIN GLARGINE-YFGN 100 UNIT/ML ~~LOC~~ SOLN
10.0000 [IU] | Freq: Every day | SUBCUTANEOUS | Status: DC
  Administered 2023-05-25: 10 [IU] via SUBCUTANEOUS
  Filled 2023-05-24: qty 0.1

## 2023-05-24 MED ORDER — LORAZEPAM 2 MG/ML IJ SOLN
0.5000 mg | Freq: Once | INTRAMUSCULAR | Status: AC
  Administered 2023-05-24: 0.5 mg via INTRAVENOUS
  Filled 2023-05-24: qty 1

## 2023-05-24 MED ORDER — MORPHINE SULFATE (PF) 2 MG/ML IV SOLN
1.0000 mg | Freq: Once | INTRAVENOUS | Status: AC
  Administered 2023-05-24: 1 mg via INTRAVENOUS
  Filled 2023-05-24: qty 1

## 2023-05-24 MED ORDER — INSULIN GLARGINE-YFGN 100 UNIT/ML ~~LOC~~ SOLN
5.0000 [IU] | Freq: Once | SUBCUTANEOUS | Status: AC
  Administered 2023-05-24: 5 [IU] via SUBCUTANEOUS
  Filled 2023-05-24: qty 0.05

## 2023-05-24 NOTE — Inpatient Diabetes Management (Signed)
Inpatient Diabetes Program Recommendations  AACE/ADA: New Consensus Statement on Inpatient Glycemic Control (2015)  Target Ranges:  Prepandial:   less than 140 mg/dL      Peak postprandial:   less than 180 mg/dL (1-2 hours)      Critically ill patients:  140 - 180 mg/dL   Lab Results  Component Value Date   GLUCAP 346 (H) 05/24/2023   HGBA1C 11.0 (H) 05/23/2023     Diabetes history: DM2 Outpatient Diabetes medications: Levemir 12 units QD, Metformin 500 mg QD Current orders for Inpatient glycemic control: Semglee 5 units qd, Novolog 0-9 units TID and 0-5 units QHS   Inpatient Diabetes Program Recommendations:     Please consider:   -Increase Semglee to 12 units QD  Thank you, Darel Hong E. Nandini Bogdanski, RN, MSN, CDE  Diabetes Coordinator Inpatient Glycemic Control Team Team Pager [phone] (8am-5pm) 05/24/2023 11:02 AM

## 2023-05-24 NOTE — Progress Notes (Signed)
Pt reports pain described as "pressure" when voiding. Declined her prn tylenol when offered. Secure message sent to Dr. [name] for notification. Pt slept most of the night. Intermittent nausea and dizziness noted, particularly when ambulating with staff. Given prn compazine IV x1 with partial effects noted.

## 2023-05-24 NOTE — Progress Notes (Addendum)
[facility] PROGRESS NOTE   Sorcha Rotunno ZOX:[id number] DOB: 1975/12/24 DOA: 05/22/2023  PCP: Harvest Forest, MD  Brief History/Interval Summary: 47 y.o. female with medical history significant for hypertension, type 2 diabetes, who presented to the ED from home via EMS due to nausea vomiting chest pain and dizziness.  Describes her chest pain as sharp left-sided radiating to her left jaw.  Also endorses epigastric pain associated with nausea vomiting, and dizziness that started abruptly around noon.  She was also complaining of diplopia.  There was concern the patient may have had a neurological event such as TIA or stroke.  She was hospitalized for further management.  Consultants: Neurology  Procedures: Echocardiogram    Subjective/Interval History: Patient complains of burning sensation when she urinates.  Feels very anxious.  Continues to feel fatigued.    Assessment/Plan:  Acute stroke, MRI negative Patient was seen by neurology.  MRI was negative for stroke.  However neurology felt that she had MRI negative stroke. Echocardiogram shows normal systolic function. CT angiogram head and neck was done.  Defer findings to neurology. LDL is 86.  Patient started on rosuvastatin. HbA1c is 11.0.   By PT and OT.  Possible need for home health but still still quite deconditioned. Patient currently on aspirin and Plavix. Neurology has reordered MRI today.   EEG does not show any seizure or epileptiform activity.  Intractable nausea and vomiting LFTs and lipase levels were normal.  CT scan of the abdomen pelvis along with chest did not show any acute findings.  She has had gastric bypass and cholecystectomy previously.  She was tender in epigastric area.  She was placed on PPI.  Symptoms seems to be improving.  Advance diet.   Dysuria UA has been ordered. UA noted to be abnormal suggesting UTI. Will initiate ceftriaxone. Await Urine c/s.  Chronic kidney disease  stage IIIa Unclear baseline.  Her renal function has fluctuated anywhere Between 1.0-2.2 over the last year and a half.   Rising creatinine noted today.  She did get contrast with CT scan.  Will monitor urine output and recheck labs tomorrow. Potassium level noted to be normal today.  Essential hypertension Initially hypertensive encephalopathy was in the differential.  However neurology feels the patient has MRI negative stroke.  Permissive hypertension now being allowed.  Blood pressure trends have improved compared to yesterday morning.    Chest pain Probably GI in origin.  EKG without any ischemic changes.  Troponins were normal.  Continue to monitor for now.  Diabetes mellitus type 2, uncontrolled with hyperglycemia HbA1c is 11.0.  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  Currently on SSI.  Prior to admission she was noted to be on Levemir. She was also on metformin which will be held. CBGs poorly controlled.  Will increase the dose of her glargine.  Obesity Estimated body mass index is 33.07 kg/m as calculated from the following:   Height as of this encounter: 5\' 1"  (1.549 m).   Weight as of this encounter: 79.4 kg.  DVT Prophylaxis: Lovenox Code Status: Full code Family Communication: Discussed with patient Disposition Plan: To be determined  Status is: Observation The patient will require care spanning > 2 midnights and should be moved to inpatient because: Nausea vomiting, dizziness, concern for TIA versus stroke.      Medications: Scheduled:  aspirin EC  81 mg Oral Daily   clopidogrel  75 mg Oral Daily   enoxaparin (LOVENOX) injection  40 mg Subcutaneous Q24H  feeding supplement  1 Container Oral TID BM   insulin aspart  0-15 Units Subcutaneous Q4H   insulin glargine-yfgn  5 Units Subcutaneous Daily   pantoprazole (PROTONIX) IV  40 mg Intravenous Q12H   pneumococcal 20-valent conjugate vaccine  0.5 mL Intramuscular Tomorrow-1000   rosuvastatin  20 mg Oral Daily    Continuous:  sodium chloride 75 mL/hr at 05/24/23 1006   ZOX:[id number], ALPRAZolam, labetalol, melatonin, polyethylene glycol, prochlorperazine  Antibiotics: Anti-infectives (From admission, onward)    None       Objective:  Vital Signs  Vitals:   05/23/23 2153 05/24/23 0007 05/24/23 0337 05/24/23 0850  BP: 132/67 (!) 153/84 (!) 164/87 (!) 159/80  Pulse: 91 84 79 96  Resp: 17 16 16 15   Temp: 98.6 F (37 C) 98.7 F (37.1 C) 98.8 F (37.1 C) 97.7 F (36.5 C)  TempSrc: Oral   Oral  SpO2: 100% 100% 100% 100%  Weight:      Height:        Intake/Output Summary (Last 24 hours) at 05/24/2023 1124 Last data filed at 05/24/2023 0719 Gross per 24 hour  Intake 240 ml  Output 1 ml  Net 239 ml   Filed Weights   05/22/23 2253  Weight: 79.4 kg    General appearance: Awake alert.  In no distress Resp: Clear to auscultation bilaterally.  Normal effort Cardio: S1-S2 is normal regular.  No S3-S4.  No rubs murmurs or bruit GI: Abdomen is soft.  Nontender nondistended.  Bowel sounds are present normal.  No masses organomegaly Extremities: No edema.     Lab Results:  Data Reviewed: I have personally reviewed following labs and reports of the imaging studies  CBC: Recent Labs  Lab 05/22/23 2300 05/23/23 0005 05/23/23 0635 05/24/23 0436  WBC 10.3  --  12.7* 11.4*  HGB 12.1 13.6 11.9* 11.6*  HCT 37.6 40.0 36.6 35.3*  MCV 89.1  --  88.4 87.8  PLT 232  --  245 232     Basic Metabolic Panel: Recent Labs  Lab 05/22/23 2300 05/23/23 0005 05/23/23 0635 05/24/23 0436  NA 133* 125* 131* 138  K 4.9 >8.5* 5.4* 3.6  CL 105  --  103 107  CO2 18*  --  16* 20*  GLUCOSE 334*  --  343* 102*  BUN 17  --  18 20  CREATININE 1.17*  --  1.26* 1.59*  CALCIUM 8.1*  --  7.9* 7.8*     GFR: Estimated Creatinine Clearance: 42.6 mL/min (A) (by C-G formula based on SCr of 1.59 mg/dL (H)).  Liver Function Tests: Recent Labs  Lab 05/22/23 2300 05/24/23 0436  AST 23 26   ALT 24 24  ALKPHOS 98 87  BILITOT 1.0 1.0  PROT 6.8 6.4*  ALBUMIN 2.9* 2.6*     Recent Labs  Lab 05/22/23 2300  LIPASE 25     HbA1C: Recent Labs    05/23/23 0635  HGBA1C 11.0*     CBG: Recent Labs  Lab 05/23/23 1643 05/23/23 2045 05/24/23 0014 05/24/23 0339 05/24/23 0853  GLUCAP 95 262* 73 107* 346*     Lipid Profile: Recent Labs    05/23/23 0635  CHOL 181  HDL 81  LDLCALC 86  TRIG 71  CHOLHDL 2.2     Radiology Studies: ECHOCARDIOGRAM COMPLETE  Result Date: 05/23/2023    ECHOCARDIOGRAM REPORT   Patient Name:   Virginia [name] [facility] Date of Exam: 05/23/2023 Medical Rec #:  [id number]  Height:       61.0 in Accession #:    [id number]            Weight:       175.0 lb Date of Birth:  19-Jun-1976              BSA:          1.785 m Patient Age:    46 years              BP:           149/95 mmHg Patient Gender: F                     HR:           74 bpm. Exam Location:  Inpatient Procedure: 2D Echo, Cardiac Doppler and Color Doppler Indications:    CVA  History:        Patient has prior history of Echocardiogram examinations, most                 recent 03/02/2023. Risk Factors:Hypertension, Diabetes and                 Dyslipidemia.  Sonographer:    Melissa Morford RDCS (AE, PE) Referring Phys: [id number] CAROLE N HALL  Sonographer Comments: No subcostal window. IMPRESSIONS  1. Left ventricular ejection fraction, by estimation, is 60 to 65%. The left ventricle has normal function. The left ventricle has no regional wall motion abnormalities. There is moderate to severe concentric left ventricular hypertrophy. Left ventricular diastolic parameters are consistent with Grade I diastolic dysfunction (impaired relaxation).  2. Right ventricular systolic function is normal. The right ventricular size is normal.  3. The mitral valve is normal in structure. No evidence of mitral valve regurgitation.  4. The aortic valve is tricuspid. Aortic valve regurgitation is not  visualized. No aortic stenosis is present.  5. Given moderate to severe LVH, consider CMR to assess for infiltrative cardiomyopathy (was suggested on prior study as well). Comparison(s): No significant change from prior study. Conclusion(s)/Recommendation(s): No intracardiac source of embolism detected on this transthoracic study. Consider a transesophageal echocardiogram to exclude cardiac source of embolism if clinically indicated. FINDINGS  Left Ventricle: Left ventricular ejection fraction, by estimation, is 60 to 65%. The left ventricle has normal function. The left ventricle has no regional wall motion abnormalities. The left ventricular internal cavity size was normal in size. There is  moderate to severe concentric left ventricular hypertrophy. Left ventricular diastolic parameters are consistent with Grade I diastolic dysfunction (impaired relaxation). Right Ventricle: The right ventricular size is normal. No increase in right ventricular wall thickness. Right ventricular systolic function is normal. Left Atrium: Left atrial size was normal in size. Right Atrium: Right atrial size was normal in size. Pericardium: There is no evidence of pericardial effusion. Mitral Valve: The mitral valve is normal in structure. No evidence of mitral valve regurgitation. Tricuspid Valve: The tricuspid valve is normal in structure. Tricuspid valve regurgitation is trivial. Aortic Valve: The aortic valve is tricuspid. Aortic valve regurgitation is not visualized. No aortic stenosis is present. Pulmonic Valve: The pulmonic valve was normal in structure. Pulmonic valve regurgitation is trivial. Aorta: The aortic root is normal in size and structure. Venous: The inferior vena cava was not well visualized. IAS/Shunts: The atrial septum is grossly normal.  LEFT VENTRICLE PLAX 2D LVIDd:         3.90 cm   Diastology LVIDs:  2.70 cm   LV e' medial:    4.79 cm/s LV PW:         1.10 cm   LV E/e' medial:  15.3 LV IVS:        0.90  cm   LV e' lateral:   5.87 cm/s LVOT diam:     2.00 cm   LV E/e' lateral: 12.5 LV SV:         64 LV SV Index:   36 LVOT Area:     3.14 cm  RIGHT VENTRICLE RV S prime:     9.68 cm/s TAPSE (M-mode): 2.3 cm LEFT ATRIUM             Index        RIGHT ATRIUM           Index LA diam:        4.00 cm 2.24 cm/m   RA Area:     12.70 cm LA Vol (A2C):   51.8 ml 29.02 ml/m  RA Volume:   29.20 ml  16.36 ml/m LA Vol (A4C):   33.0 ml 18.49 ml/m LA Biplane Vol: 44.0 ml 24.65 ml/m  AORTIC VALVE LVOT Vmax:   89.50 cm/s LVOT Vmean:  68.500 cm/s LVOT VTI:    0.204 m  AORTA Ao Root diam: 2.40 cm MITRAL VALVE               TRICUSPID VALVE MV Area (PHT): 3.93 cm    TR Peak grad:   13.8 mmHg MV E velocity: 73.50 cm/s  TR Vmax:        186.00 cm/s MV A velocity: 96.10 cm/s MV E/A ratio:  0.76        SHUNTS                            Systemic VTI:  0.20 m                            Systemic Diam: 2.00 cm Laurance Flatten MD Electronically signed by Laurance Flatten MD Signature Date/Time: 05/23/2023/12:33:46 PM    Final    EEG adult  Result Date: 05/23/2023 Charlsie Quest, MD     05/23/2023  1:10 PM Patient Name: Virginia [name] MRN: [id number] Epilepsy Attending: Charlsie Quest Referring Physician/Provider: Osvaldo Shipper, MD Date: 05/23/2023 Duration: 23.42 mins Patient history: 47yo F with dizziness getting eeg to evaluate for seizure. Level of alertness: Awake, asleep AEDs during EEG study: None Technical aspects: This EEG study was done with scalp electrodes positioned according to the 10-20 International system of electrode placement. Electrical activity was reviewed with band pass filter of 1-70Hz , sensitivity of 7 uV/mm, display speed of 60mm/sec with a 60Hz  notched filter applied as appropriate. EEG data were recorded continuously and digitally stored.  Video monitoring was available and reviewed as appropriate. Description: The posterior dominant rhythm consists of 8-9 Hz activity of moderate voltage (25-35 uV)  seen predominantly in posterior head regions, symmetric and reactive to eye opening and eye closing. Sleep was characterized by vertex waves, sleep spindles (12 to 14 Hz), maximal frontocentral region. Hyperventilation and photic stimulation were not performed.   IMPRESSION: This study is within normal limits. No seizures or epileptiform discharges were seen throughout the recording. A normal interictal EEG does not exclude the diagnosis of epilepsy. Charlsie Quest   MR BRAIN WO CONTRAST  Result Date: 05/23/2023  CLINICAL DATA:  Neuro deficit, acute, stroke suspected diplopia, right eye will not cross midline, dizziness. EXAM: MRI HEAD WITHOUT CONTRAST TECHNIQUE: Multiplanar, multiecho pulse sequences of the brain and surrounding structures were obtained without intravenous contrast. COMPARISON:  CTA head/neck 05/23/2023. FINDINGS: Brain: Motion degraded study. Within this limitation, no acute infarct or hemorrhage. Mild chronic small-vessel disease. No mass or midline shift. No hydrocephalus or extra-axial collection. No abnormal susceptibility. Vascular: Normal flow voids. Skull and upper cervical spine: Normal marrow signal. Sinuses/Orbits: Unremarkable. Other: None. IMPRESSION: Motion degraded study. Within this limitation, no acute intracranial process. Electronically Signed   By: Orvan Falconer M.D.   On: 05/23/2023 09:39   CT ANGIO HEAD NECK W WO CM  Result Date: 05/23/2023 CLINICAL DATA:  Initial evaluation for mental status change. Concern for stroke. EXAM: CT ANGIOGRAPHY HEAD AND NECK WITH AND WITHOUT CONTRAST TECHNIQUE: Multidetector CT imaging of the head and neck was performed using the standard protocol during bolus administration of intravenous contrast. Multiplanar CT image reconstructions and MIPs were obtained to evaluate the vascular anatomy. Carotid stenosis measurements (when applicable) are obtained utilizing NASCET criteria, using the distal internal carotid diameter as the  denominator. RADIATION DOSE REDUCTION: This exam was performed according to the departmental dose-optimization program which includes automated exposure control, adjustment of the mA and/or kV according to patient size and/or use of iterative reconstruction technique. CONTRAST:  60mL OMNIPAQUE IOHEXOL 350 MG/ML SOLN COMPARISON:  Prior study from 04/18/2018. FINDINGS: CT HEAD FINDINGS Brain: Some contrast material remains on board from CTA performed earlier the same day. Cerebral volume within normal limits for patient age. No evidence for acute intracranial hemorrhage. No findings to suggest acute large vessel territory infarct. No mass lesion, midline shift, or mass effect. Ventricles are normal in size without evidence for hydrocephalus. No extra-axial fluid collection identified. Vascular: Contrast material seen throughout the intracranial vasculature. Skull: Scalp soft tissues demonstrate no acute abnormality. Calvarium intact. Sinuses/Orbits: Globes and orbital soft tissues within normal limits. Visualized paranasal sinuses are clear. No mastoid effusion. CTA NECK FINDINGS Aortic arch: Standard branching. Imaged portion shows no evidence of aneurysm or dissection. No significant stenosis of the major arch vessel origins. Right carotid system: No evidence of dissection, stenosis (50% or greater), or occlusion. Left carotid system: No evidence of dissection, stenosis (50% or greater), or occlusion. Vertebral arteries: Right vertebral artery dominant. Left vertebral artery hypoplastic and arises directly from the aortic arch. No dissection or stenosis. Skeleton: No discrete or worrisome osseous lesions. Other neck: No other acute finding within the neck. Upper chest: No acute finding within the visualized upper chest. Review of the MIP images confirms the above findings CTA HEAD FINDINGS Anterior circulation: Both internal carotid arteries are widely patent through the siphons without stenosis or other  abnormality. Right A1 segment widely patent. Left A1 hypoplastic and/or absent. Tiny 1-2 mm outpouching arising from the anterior communicating canning artery complex felt to be most consistent with a small vascular infundibulum, likely related to a hypoplastic left A1 segment (series 11, image 109). Both ACAs widely patent distally without stenosis. No M1 stenosis or occlusion. No proximal MCA branch occlusion or high-grade stenosis. Distal MCA branches perfused and symmetric. Posterior circulation: Dominant right V4 segment widely patent. Left V4 segment hypoplastic and patent as well. Both PICA patent at their origins. Basilar patent without stenosis. Superior cerebral arteries patent bilaterally. Right PCA supplied via the basilar. Fetal type origin of the left PCA. Both PCAs are widely patent to their distal aspects without  stenosis. Venous sinuses: Patent allowing for timing the contrast bolus. Anatomic variants: As above.  No aneurysm. Review of the MIP images confirms the above findings IMPRESSION: 1. Normal CTA of the head and neck. No large vessel occlusion or other emergent finding. No hemodynamically significant or correctable stenosis. 2. No other acute intracranial abnormality. Electronically Signed   By: Rise Mu M.D.   On: 05/23/2023 03:42   CT Angio Chest/Abd/Pel for Dissection W and/or W/WO  Result Date: 05/23/2023 CLINICAL DATA:  Acute aortic syndrome EXAM: CT ANGIOGRAPHY CHEST, ABDOMEN AND PELVIS TECHNIQUE: Non-contrast CT of the chest was initially obtained. Multidetector CT imaging through the chest, abdomen and pelvis was performed using the standard protocol during bolus administration of intravenous contrast. Multiplanar reconstructed images and MIPs were obtained and reviewed to evaluate the vascular anatomy. RADIATION DOSE REDUCTION: This exam was performed according to the departmental dose-optimization program which includes automated exposure control, adjustment of the  mA and/or kV according to patient size and/or use of iterative reconstruction technique. CONTRAST:  75mL OMNIPAQUE IOHEXOL 350 MG/ML SOLN COMPARISON:  03/01/2023 FINDINGS: CTA CHEST FINDINGS Cardiovascular: Preferential opacification of the thoracic aorta. No evidence of thoracic aortic aneurysm or dissection. Normal heart size. No pericardial effusion. Mediastinum/Nodes: Visualized thyroid is unremarkable. The esophagus is patulous but is otherwise unremarkable. No pathologic adenopathy within the thorax. Lungs/Pleura: Lungs are clear. No pleural effusion or pneumothorax. Musculoskeletal: No acute bone abnormality. No lytic or blastic bone lesion. Review of the MIP images confirms the above findings. CTA ABDOMEN AND PELVIS FINDINGS VASCULAR Aorta: Normal caliber aorta without aneurysm, dissection, vasculitis or significant stenosis. Celiac: Patent without evidence of aneurysm, dissection, vasculitis or significant stenosis. Note is made of an accessory left hepatic artery. SMA: Patent without evidence of aneurysm, dissection, vasculitis or significant stenosis. Note is made of an replaced right hepatic artery. Renals: Both renal arteries are patent without evidence of aneurysm, dissection, vasculitis, fibromuscular dysplasia or significant stenosis. IMA: Patent without evidence of aneurysm, dissection, vasculitis or significant stenosis. Inflow: Patent without evidence of aneurysm, dissection, vasculitis or significant stenosis. Veins: No obvious venous abnormality within the limitations of this arterial phase study. Review of the MIP images confirms the above findings. NON-VASCULAR Hepatobiliary: No focal liver abnormality is seen. Status post cholecystectomy. No intrahepatic biliary ductal dilation. Mild extrahepatic biliary ductal dilation which tapers toward the ampulla is again identified likely representing post cholecystectomy change. Pancreas: Unremarkable Spleen: Unremarkable Adrenals/Urinary Tract: The  adrenal glands are unremarkable. The kidneys are normal save for mild right cortical scarring. Small nondependent gas is seen within the bladder lumen which may relate to recent catheterization. The bladder is otherwise unremarkable. Stomach/Bowel: Surgical changes of gastric bypass are identified. The stomach, small bowel, and large bowel are otherwise unremarkable. No evidence of obstruction or focal inflammation. Appendix normal. No free intraperitoneal gas or fluid Lymphatic: No pathologic adenopathy within the abdomen and pelvis. Reproductive: Uterus and bilateral adnexa are unremarkable. Other: No no abdominal wall hernia Musculoskeletal: No acute bone abnormality. No lytic or blastic bone lesion. Review of the MIP images confirms the above findings. IMPRESSION: 1. No acute intrathoracic or intra-abdominal pathology identified. No thoracoabdominal aortic aneurysm or dissection. 2. Surgical changes of gastric bypass and cholecystectomy. Electronically Signed   By: Helyn Numbers M.D.   On: 05/23/2023 01:02   DG Chest Portable 1 View  Result Date: 05/22/2023 CLINICAL DATA:  Chest pain and vomiting. EXAM: PORTABLE CHEST 1 VIEW COMPARISON:  March 01, 2023 FINDINGS: The heart size and mediastinal contours  are within normal limits. Both lungs are clear. The visualized skeletal structures are unremarkable. IMPRESSION: No active disease. Electronically Signed   By: Aram Candela M.D.   On: 05/22/2023 23:32       LOS: 0 days   Ayleah Hofmeister [name]  [facility] Pager on [email or web address]  05/24/2023, 11:24 AM

## 2023-05-24 NOTE — Progress Notes (Signed)
Occupational Therapy Treatment Patient Details Name: Virginia [name] MRN: [id number] DOB: 07/26/1976 Today's Date: 05/24/2023   History of present illness 47 y.o. female presents to [facility] hospital on 05/22/2023 with nausea, vomiting, chest pain, and dizziness. CT/MRI brain negative. PMH includes HTN, DMII, bipolar affective, depression.   OT comments  Pt with gradual progress towards goals though remains limited by dizziness. Pt reports she has been transferring to [facility] with assist and able to stand with OT today though unable to progress away from bed d/t progressive dizziness. Educated on gaze stabilization with some improvements though continued reinforcement needed. Pt does endorse hx of vertigo 20 years ago w/ similar symptom presentation. Once dizziness resolves, anticipate quick progress to PLOF.  BP in bed: 154/80 BP sitting EOB: 138/82 Unable to stand long enough for additional BP   Recommendations for follow up therapy are one component of a multi-disciplinary discharge planning process, led by the attending physician.  Recommendations may be updated based on patient status, additional functional criteria and insurance authorization.    Assistance Recommended at Discharge Intermittent Supervision/Assistance  Patient can return home with the following  A little help with walking and/or transfers;A little help with bathing/dressing/bathroom;Assistance with cooking/housework;Assist for transportation   Equipment Recommendations  Other (comment) (TBD)    Recommendations for Other Services      Precautions / Restrictions Precautions Precautions: Fall Precaution Comments: dizziness Restrictions Weight Bearing Restrictions: No       Mobility Bed Mobility Overal bed mobility: Modified Independent Bed Mobility: Supine to Sit, Sit to Supine                Transfers Overall transfer level: Needs assistance Equipment used: None Transfers: Sit to/from Stand Sit to Stand:  Min guard                 Balance Overall balance assessment: Needs assistance Sitting-balance support: No upper extremity supported, Feet unsupported Sitting balance-Leahy Scale: Fair     Standing balance support: No upper extremity supported Standing balance-Leahy Scale: Fair Standing balance comment: able to stand without UE support; min guard for safety due to dizziness; unable to assess dynamic tasks due to progressive dizziness/room spinning                           ADL either performed or assessed with clinical judgement   ADL Overall ADL's : Needs assistance/impaired Eating/Feeding: Independent   Grooming: Set up;Sitting           Upper Body Dressing : Set up;Sitting;Bed level         Toilet Transfer Details (indicate cue type and reason): reports that she has been getting to [facility] with assist though still dizzy with this           General ADL Comments: Limited with dizziness though improving; educated in gaze stabilization with pt able to progress to tolerate brief standing before dizziness progressed    Extremity/Trunk Assessment Upper Extremity Assessment Upper Extremity Assessment: Overall WFL for tasks assessed   Lower Extremity Assessment Lower Extremity Assessment: Defer to PT evaluation        Vision   Vision Assessment?: No apparent visual deficits Additional Comments: no apparent nystagmus with general mobility   Perception     Praxis      Cognition Arousal/Alertness: Awake/alert Behavior During Therapy: WFL for tasks assessed/performed Overall Cognitive Status: Within Functional Limits for tasks assessed  General Comments: appears WFL; can be impulsive with movement but easily educated and redirected; pleasant        Exercises      Shoulder Instructions       General Comments Pt reports having vertigo 20 years ago when pregnant with same symptoms though worse now.     Pertinent Vitals/ Pain       Pain Assessment Pain Assessment: No/denies pain  Home Living                                          Prior Functioning/Environment              Frequency  Min 2X/week        Progress Toward Goals  OT Goals(current goals can now be found in the care plan section)  Progress towards OT goals: OT to reassess next treatment  Acute Rehab OT Goals Patient Stated Goal: resolve dizziness OT Goal Formulation: With patient Time For Goal Achievement: 06/06/23 Potential to Achieve Goals: Fair ADL Goals Pt Will Perform Grooming: with supervision;standing Pt Will Perform Lower Body Bathing: with supervision;sit to/from stand Pt Will Perform Lower Body Dressing: with supervision;sit to/from stand Pt Will Transfer to Toilet: with supervision;ambulating;regular height toilet Pt Will Perform Toileting - Clothing Manipulation and hygiene: with supervision;sit to/from stand Additional ADL Goal #1: Pt will tolerate sitting EOB for further vision and vestibular testing greater than 10 mins at supervision level.  Plan Discharge plan remains appropriate    Co-evaluation                 AM-PAC OT "6 Clicks" Daily Activity     Outcome Measure   Help from another person eating meals?: None Help from another person taking care of personal grooming?: A Little Help from another person toileting, which includes using toliet, bedpan, or urinal?: A Little Help from another person bathing (including washing, rinsing, drying)?: A Little Help from another person to put on and taking off regular upper body clothing?: A Little Help from another person to put on and taking off regular lower body clothing?: A Little 6 Click Score: 19    End of Session    OT Visit Diagnosis: Unsteadiness on feet (R26.81);Dizziness and giddiness (R42)   Activity Tolerance Treatment limited secondary to medical complications (Comment) (dizziness)   Patient  Left in bed;with call bell/phone within reach;with bed alarm set;Other (comment) (SLP at bedside)   Nurse Communication Mobility status;Other (comment) (BP)        Time: 7829-5621 OT Time Calculation (min): 18 min  Charges: OT General Charges $OT Visit: 1 Visit OT Treatments $Therapeutic Activity: 8-22 mins  Bradd Canary, OTR/L Acute Rehab Services Office: [phone]   Lorre Munroe 05/24/2023, 8:41 AM

## 2023-05-24 NOTE — TOC Initial Note (Signed)
Transition of Care [facility]) - Initial/Assessment Note    Patient Details  Name: Virginia [name] MRN: [id number] Date of Birth: 1976-08-08  Transition of [facility]) CM/SW Contact:    Kermit Balo, RN Phone Number: 05/24/2023, 1:22 PM  Clinical Narrative:                 Pt is from home alone. Pt states she doesn't have anyone to check on her.  No DME at home. Pt manages her own medications and verifies she has been noncompliant with her meds.  Pharmacy: [facility] on [address] Pt states she doesn't have a PCP. She refused CM assistance in getting one.  CM inquired about outpatient therapy and pt refused. Unable to set up [facility] due to her medicaid.  She does confirm issues with transportation. CM will add the number for transportation services through her medicaid.  TOC following.  Expected Discharge Plan: Home/Self Care Barriers to Discharge: Continued Medical Work up   Patient Goals and CMS Choice            Expected Discharge Plan and Services   Discharge Planning Services: CM Consult   Living arrangements for the past 2 months: Single Family Home                                      Prior Living Arrangements/Services Living arrangements for the past 2 months: Single Family Home Lives with:: Self Patient language and need for interpreter reviewed:: Yes Do you feel safe going back to the place where you live?: Yes        Care giver support system in place?: No (comment)   Criminal Activity/Legal Involvement Pertinent to Current Situation/Hospitalization: No - Comment as needed  Activities of Daily Living Home Assistive Devices/Equipment: CBG Meter ADL Screening (condition at time of admission) Patient's cognitive ability adequate to safely complete daily activities?: Yes Is the patient deaf or have difficulty hearing?: No Does the patient have difficulty seeing, even when wearing glasses/contacts?: No Does the patient have difficulty concentrating,  remembering, or making decisions?: No Patient able to express need for assistance with ADLs?: Yes Does the patient have difficulty dressing or bathing?: No Independently performs ADLs?: Yes (appropriate for developmental age) Does the patient have difficulty walking or climbing stairs?: No Weakness of Legs: None Weakness of Arms/Hands: None  Permission Sought/Granted                  Emotional Assessment Appearance:: Appears stated age Attitude/Demeanor/Rapport: Apprehensive Affect (typically observed): Blunt, Guarded Orientation: : Oriented to Self, Oriented to Place, Oriented to  Time, Oriented to Situation   Psych Involvement: No (comment)  Admission diagnosis:  Dizziness [R42] Acute CVA (cerebrovascular accident) [facility]) [I63.9] Patient Active Problem List   Diagnosis Date Noted   Acute CVA (cerebrovascular accident) (HCC) 05/24/2023   Dizziness 05/23/2023   Subacute cough 03/03/2023   Seasonal allergic rhinitis 03/03/2023   Mild intermittent asthma with acute exacerbation 03/03/2023   SOB (shortness of breath) 03/01/2023   Normocytic anemia 03/01/2023   Leukocytosis 03/01/2023   Urinary tract infection without hematuria    Dehydration    Gastroesophageal reflux disease    Class 2 obesity due to excess calories with body mass index (BMI) of 39.0 to 39.9 in adult    Hypokalemia    Hypomagnesemia    Anxiety 02/20/2017   Diabetes mellitus without complication (HCC) 02/20/2017   Sepsis (  HCC) 02/20/2017   Abdominal pain 02/20/2017   Nausea & vomiting 02/20/2017   Tobacco abuse 02/20/2017   Flank pain 02/20/2017   Hypertension    Lactic acidosis    PCP:  [place], MD Pharmacy:   [facility] [phone] [address], [place] - [phone] AT [facility] OF [facility] [address] [place] [address] Phone: [phone] Fax: [phone]  [facility] #[id number] [address], [place] - [address] AT [facility] OF [address] &  [address] [place] [address] Phone: [phone] Fax: [phone]  [place] [facility] [address] [place] [address] Phone: [phone] Fax: [phone]     Social Determinants of Health (SDOH) Social History: SDOH Screenings   Food Insecurity: Food Insecurity Present (05/23/2023)  Housing: Low Risk  (05/23/2023)  Transportation Needs: Unmet Transportation Needs (05/23/2023)  Utilities: At Risk (05/23/2023)  Tobacco Use: Medium Risk (05/22/2023)   SDOH Interventions:     Readmission Risk Interventions     No data to display

## 2023-05-24 NOTE — Progress Notes (Addendum)
STROKE TEAM PROGRESS NOTE   INTERVAL HISTORY No family at the bedside.  She is in the bed laying in no apparent distress.  She states her dizziness and nausea vomiting is improved today however still does get dizzy when she stands up. Repeat MRI today to evaluate for brainstem stroke  Vitals:   05/24/23 0007 05/24/23 0337 05/24/23 0850 05/24/23 1202  BP: (!) 153/84 (!) 164/87 (!) 159/80 (!) 143/89  Pulse: 84 79 96 89  Resp: 16 16 15 14   Temp: 98.7 F (37.1 C) 98.8 F (37.1 C) 97.7 F (36.5 C) 98.9 F (37.2 C)  TempSrc:   Oral Oral  SpO2: 100% 100% 100% 100%  Weight:      Height:       CBC:  Recent Labs  Lab 05/23/23 0635 05/24/23 0436  WBC 12.7* 11.4*  HGB 11.9* 11.6*  HCT 36.6 35.3*  MCV 88.4 87.8  PLT 245 232    Basic Metabolic Panel:  Recent Labs  Lab 05/23/23 0635 05/24/23 0436  NA 131* 138  K 5.4* 3.6  CL 103 107  CO2 16* 20*  GLUCOSE 343* 102*  BUN 18 20  CREATININE 1.26* 1.59*  CALCIUM 7.9* 7.8*    Lipid Panel:  Recent Labs  Lab 05/23/23 0635  CHOL 181  TRIG 71  HDL 81  CHOLHDL 2.2  VLDL 14  LDLCALC 86    HgbA1c:  Recent Labs  Lab 05/23/23 0635  HGBA1C 11.0*    Urine Drug Screen:  Recent Labs  Lab 05/23/23 1530  LABOPIA NONE DETECTED  COCAINSCRNUR NONE DETECTED  LABBENZ NONE DETECTED  AMPHETMU POSITIVE*  THCU NONE DETECTED  LABBARB NONE DETECTED    Alcohol Level No results for input(s): "ETH" in the last 168 hours.  IMAGING past 24 hours EEG adult  Result Date: 05/23/2023 Charlsie Quest, MD     05/23/2023  1:10 PM Patient Name: Virginia [name] MRN: [id number] Epilepsy Attending: Charlsie Quest Referring Physician/Provider: Osvaldo Shipper, MD Date: 05/23/2023 Duration: 23.42 mins Patient history: 48yo F with dizziness getting eeg to evaluate for seizure. Level of alertness: Awake, asleep AEDs during EEG study: None Technical aspects: This EEG study was done with scalp electrodes positioned according to the 10-20  International system of electrode placement. Electrical activity was reviewed with band pass filter of 1-70Hz , sensitivity of 7 uV/mm, display speed of 57mm/sec with a 60Hz  notched filter applied as appropriate. EEG data were recorded continuously and digitally stored.  Video monitoring was available and reviewed as appropriate. Description: The posterior dominant rhythm consists of 8-9 Hz activity of moderate voltage (25-35 uV) seen predominantly in posterior head regions, symmetric and reactive to eye opening and eye closing. Sleep was characterized by vertex waves, sleep spindles (12 to 14 Hz), maximal frontocentral region. Hyperventilation and photic stimulation were not performed.   IMPRESSION: This study is within normal limits. No seizures or epileptiform discharges were seen throughout the recording. A normal interictal EEG does not exclude the diagnosis of epilepsy. Virginia [name]    PHYSICAL EXAM  Temp:  [97.7 F (36.5 C)-98.9 F (37.2 C)] 98.9 F (37.2 C) (06/11 1202) Pulse Rate:  [75-96] 89 (06/11 1202) Resp:  [14-24] 14 (06/11 1202) BP: (122-203)/(67-103) 143/89 (06/11 1202) SpO2:  [100 %] 100 % (06/11 1202)  General - Well nourished, well developed, uncomfortable due to dizziness and nausea Cardiovascular - Regular rhythm and rate.  Mental Status -  Level of arousal and orientation to time, place, and person were  intact. Language including expression, naming, repetition, comprehension was assessed and found intact. Attention span and concentration were normal. Recent and remote memory were intact. Fund of Knowledge was assessed and was intact.  Cranial Nerves II - XII - II - Visual field very difficult to examine her eyes as she is squeezing them shot however does have blink to threat bilaterally III, IV, VI -on forced opening she does have a rightward gaze and will cross midline.  Nystagmus on leftward gaze. complains of photosensitivity V - Facial sensation intact  bilaterally. VII -right facial droop VIII - Hearing & vestibular intact bilaterally. X - Palate elevates symmetrically. XI - Chin turning & shoulder shrug intact bilaterally. XII - Tongue protrusion intact.  Motor Strength -generalized weakness metric throughout.  The patient's strength was normal in all extremities and pronator drift was absent.  Bulk was normal and fasciculations were absent.   Motor Tone - Muscle tone was assessed at the neck and appendages and was normal  Sensory - Light touch, temperature/pinprick were assessed and were symmetrical.    Coordination - The patient had normal movements in the hands and feet with no ataxia or dysmetria.  Tremor was absent.  Gait and Station - deferred.  ASSESSMENT/PLAN Virginia [name] is a 47 y.o. female with history of gastric bypass, bipolar disorder, anxiety and depression, uncontrolled diabetes, uncontrolled hypertension presenting with dizziness and diplopia followed by nausea and vomiting.   Stroke - MRI negative posterior stroke, DDx including hypertensive encephalopathy CT head No acute abnormality.  CTA head & neck No LVO  MRI  no acute process  Repeat MRI 6/11- no acute infarct 2D Echo EF 60 to 65%.  Moderate to severe left ventricular hypertrophy  LDL 86 HgbA1c 11.0 UDS positive for amphetamines (pt takes Adderall at home) Routine EEG normal VTE prophylaxis - Lovenox No antithrombotic prior to admission, now on ASA 81 and clopidogrel 75 mg daily DAPT for 3 weeks and then ASA alone Therapy recommendations: Outpatient PT and home health OT Disposition:  pending  Hypertension Hypertensive urgency  Home meds:  none Stable on the high end Add amlodipine 10 Gradually normalize in 2-3 days Long-term BP goal normotensive  Hyperlipidemia Home meds:  none  LDL 86, goal < 70 On Crestor 20mg   Continue statin at discharge  Diabetes type II Uncontrolled Home meds:  metformin 500mg  BID, insulin  HgbA1c 11.0,  goal < 7.0 CBGs SSI Diabetes management consult Will need close outpatient follow up after discharge for better DM management   Tobacco abuse EtOH use Current cigarette smoker about 4-5 a day  Drinks about 2 malt liquors a day Declined nicotine patch  Cessation education  Other Stroke Risk Factors Obesity, Body mass index is 33.07 kg/m., BMI >/= 30 associated with increased stroke risk, recommend weight loss, diet and exercise as appropriate   Other Active Problems CKD stage III, creatinine 1.26-> 1.59, put on IV fluid  Hospital day # 0  Gevena Mart DNP, ACNPC-AG  [facility]  ATTENDING NOTE: I reviewed above note and agree with the assessment and plan. Pt was seen and examined.   No family at bedside.  Patient is sleepy, hard to arouse.  No acute event overnight.  Repeat limited MRI showed again no acute infarct.  However, patient presentation still concerning for get out of a negative posterior circulation stroke.  DDx also including hypertensive encephalopathy.  At amlodipine 10 to gradually normalize BP in 2 to 3 days.  Continue DAPT for 3 weeks  and then aspirin alone.  Continue Crestor 20.  Smoking cessation and alcohol limitation will be provided again.  Aggressive risk factor modification including uncontrolled hypertension and uncontrolled diabetes.  PT and OT recommend outpatient PT and a home health OT.  For detailed assessment and plan, please refer to above/below as I have made changes wherever appropriate.   Neurology will sign off. Please call with questions. Pt will follow up with stroke clinic NP at [facility] in about 4 weeks. Thanks for the consult.   Marvel Plan, MD PhD Stroke Neurology 05/24/2023 6:53 PM   To contact Stroke Continuity provider, please refer to [email or web address]. After hours, contact General Neurology

## 2023-05-24 NOTE — Plan of Care (Signed)
  Problem: Coping: Goal: Ability to adjust to condition or change in health will improve 05/24/2023 0632 by Charlynn Grimes, RN Outcome: Progressing 05/23/2023 2326 by Charlynn Grimes, RN Outcome: Progressing   Problem: Fluid Volume: Goal: Ability to maintain a balanced intake and output will improve Outcome: Progressing   Problem: Health Behavior/Discharge Planning: Goal: Ability to manage health-related needs will improve 05/24/2023 0633 by Charlynn Grimes, RN Outcome: Not Progressing 05/23/2023 2326 by Charlynn Grimes, RN Outcome: Progressing   Problem: Metabolic: Goal: Ability to maintain appropriate glucose levels will improve Outcome: Progressing   Problem: Nutritional: Goal: Maintenance of adequate nutrition will improve Outcome: Not Progressing

## 2023-05-24 NOTE — Progress Notes (Addendum)
Physical Therapy Treatment Patient Details Name: Virginia [name] MRN: [id number] DOB: Mar 16, 1976 Today's Date: 05/24/2023   History of Present Illness 47 y.o. female presents to [facility] hospital on 05/22/2023 with nausea, vomiting, chest pain, and dizziness. CT/MRI brain negative. PMH includes HTN, DMII, bipolar affective, depression.    PT Comments    Pt reports mild improvement in dizziness/nausea in comparison to yesterday. Describes as "room spinning," sensation and exacerbated by turning head to left. Nystagmus noted with leftward gaze. Positive Dix Hallpike on assessment and subsequently performed Epley maneuver to R. Pt initially reporting mild improvement, however, once sitting up, stating that symptoms were unchanged. Encouraged keeping HOB elevated throughout day for accommodation. Will progress as tolerated.    Recommendations for follow up therapy are one component of a multi-disciplinary discharge planning process, led by the attending physician.  Recommendations may be updated based on patient status, additional functional criteria and insurance authorization.  Follow Up Recommendations       Assistance Recommended at Discharge Frequent or constant Supervision/Assistance  Patient can return home with the following A little help with walking and/or transfers;A little help with bathing/dressing/bathroom;Assistance with cooking/housework;Assist for transportation;Direct supervision/assist for medications management;Direct supervision/assist for financial management;Help with stairs or ramp for entrance   Equipment Recommendations  Other (comment) (TBD pending progress)    Recommendations for Other Services       Precautions / Restrictions Precautions Precautions: Fall Restrictions Weight Bearing Restrictions: No     Mobility  Bed Mobility Overal bed mobility: Modified Independent                  Transfers Overall transfer level: Needs assistance Equipment  used: None Transfers: Sit to/from Stand Sit to Stand: Min guard           General transfer comment: Min guard for safety, able to take lateral steps along side of bed    Ambulation/Gait                   Stairs             Wheelchair Mobility    Modified Rankin (Stroke Patients Only) Modified Rankin (Stroke Patients Only) Pre-Morbid Rankin Score: No symptoms Modified Rankin: Moderately severe disability     Balance Overall balance assessment: Needs assistance Sitting-balance support: No upper extremity supported, Feet unsupported Sitting balance-Leahy Scale: Good     Standing balance support: No upper extremity supported, During functional activity Standing balance-Leahy Scale: Fair                              Cognition Arousal/Alertness: Awake/alert Behavior During Therapy: WFL for tasks assessed/performed Overall Cognitive Status: Within Functional Limits for tasks assessed                                          Exercises      General Comments        Pertinent Vitals/Pain Pain Assessment Pain Assessment: No/denies pain    Home Living                          Prior Function            PT Goals (current goals can now be found in the care plan section) Acute Rehab PT Goals Patient Stated Goal: to stop dizziness  Potential to Achieve Goals: Good Progress towards PT goals: Progressing toward goals    Frequency    Min 3X/week      PT Plan Current plan remains appropriate    Co-evaluation              AM-PAC PT "6 Clicks" Mobility   Outcome Measure  Help needed turning from your back to your side while in a flat bed without using bedrails?: None Help needed moving from lying on your back to sitting on the side of a flat bed without using bedrails?: None Help needed moving to and from a bed to a chair (including a wheelchair)?: A Little Help needed standing up from a chair using  your arms (e.g., wheelchair or bedside chair)?: A Little Help needed to walk in hospital room?: A Little Help needed climbing 3-5 steps with a railing? : A Lot 6 Click Score: 19    End of Session   Activity Tolerance: Patient limited by fatigue Patient left: in bed;with call bell/phone within reach;with family/visitor present Nurse Communication: Mobility status PT Visit Diagnosis: Other abnormalities of gait and mobility (R26.89);Muscle weakness (generalized) (M62.81);Other symptoms and signs involving the nervous system (R29.898)     Time: 1610-9604 PT Time Calculation (min) (ACUTE ONLY): 12 min  Charges:  $Therapeutic Activity: 8-22 mins                     Lillia Pauls, PT, DPT Acute Rehabilitation Services Office [phone]    Norval Morton 05/24/2023, 2:59 PM

## 2023-05-24 NOTE — Progress Notes (Signed)
Hypoglycemic Event  CBG: 58  Treatment: 8 oz juice/soda  Symptoms: Sweaty, weak  Follow-up CBG: Time:1612 CBG Result:131  Possible Reasons for Event: Unknown  Comments/MD notified:MD [name] Notified    Virginia [name]

## 2023-05-24 NOTE — Evaluation (Signed)
Clinical/Bedside Swallow Evaluation Patient Details  Name: Virginia [name] MRN: [id number] Date of Birth: June 21, 1976  Today's Date: 05/24/2023 Time: SLP Start Time (ACUTE ONLY): 0830 SLP Stop Time (ACUTE ONLY): 0838 SLP Time Calculation (min) (ACUTE ONLY): 8 min  Past Medical History:  Past Medical History:  Diagnosis Date   Anxiety    Arthritis    Bipolar affective (HCC)    Depression    Diabetes mellitus    Hypertension    Renal disorder    Past Surgical History:  Past Surgical History:  Procedure Laterality Date   CESAREAN SECTION     CHOLECYSTECTOMY     GASTRIC BYPASS     KNEE SURGERY     HPI:  Pt is a 47 y.o. female presents to [facility] hospital on 05/22/2023 with nausea, vomiting, chest pain, and dizziness. CT negative and MRI negative. PMH includes HTN, DMII, bipolar affective, depression.    Assessment / Plan / Recommendation  Clinical Impression  Pt seen for clinical swallowing evaluation. Pt demonstrated an intact oral swallow. Pharyngeal swallow appeared [facility] per clinical assessment. Recommend initiation of a regular diet with thin liquids. SLP to sign off as pt without acute SLP needs.      Aspiration Risk  Mild aspiration risk (from n/v)   Diet Recommendation Regular;Thin liquid   Medication Administration:  (as tolerated) Supervision: Patient able to self feed Postural Changes: Seated upright at 90 degrees;Remain upright for at least 30 minutes after po intake    Other  Recommendations Oral Care Recommendations: Oral care QID;Patient independent with oral care (set up)    Recommendations for follow up therapy are one component of a multi-disciplinary discharge planning process, led by the attending physician.  Recommendations may be updated based on patient status, additional functional criteria and insurance authorization.  Follow up Recommendations No SLP follow up         Functional Status Assessment Patient has not had a recent decline in their  functional status         Prognosis Prognosis for improved oropharyngeal function: Good      Swallow Study   General Date of Onset: 05/22/23 HPI: Pt is a 47 y.o. female presents to [facility] hospital on 05/22/2023 with nausea, vomiting, chest pain, and dizziness. CT negative and MRI negative. PMH includes HTN, DMII, bipolar affective, depression. Type of Study: Bedside Swallow Evaluation Diet Prior to this Study: Regular;Thin liquids (Level 0) Temperature Spikes Noted: No Respiratory Status: Room air History of Recent Intubation: No Behavior/Cognition: Alert;Cooperative;Pleasant mood Oral Cavity Assessment: Within Functional Limits Oral Care Completed by SLP: Recent completion by staff Oral Cavity - Dentition: Adequate natural dentition Vision: Functional for self-feeding Self-Feeding Abilities: Able to feed self Patient Positioning: Upright in bed Baseline Vocal Quality: Normal Volitional Cough: Strong Volitional Swallow: Able to elicit    Oral/Motor/Sensory Function Overall Oral Motor/Sensory Function: Mild impairment Facial ROM: Reduced right (vs natural asymmetry) Facial Strength: Within Functional Limits Facial Sensation: Within Functional Limits Lingual ROM: Within Functional Limits Lingual Symmetry: Within Functional Limits Lingual Strength: Within Functional Limits Velum: Within Functional Limits Mandible: Within Functional Limits   Ice Chips Ice chips: Not tested   Thin Liquid Thin Liquid: Within functional limits Presentation: Self Fed;Straw    Nectar Thick Nectar Thick Liquid: Not tested   Honey Thick Honey Thick Liquid: Not tested   Puree Puree: Within functional limits Presentation: Self Fed   Solid     Solid: Within functional limits Presentation: Self Fed     Virginia [name],  M.S., CCC-SLP Speech-Language Pathologist Secure Chat Preferred  O: [phone]  Virginia [name] 05/24/2023,8:47 AM

## 2023-05-25 ENCOUNTER — Other Ambulatory Visit (HOSPITAL_COMMUNITY): Payer: Self-pay

## 2023-05-25 ENCOUNTER — Telehealth (HOSPITAL_COMMUNITY): Payer: Self-pay | Admitting: Pharmacy Technician

## 2023-05-25 DIAGNOSIS — I639 Cerebral infarction, unspecified: Secondary | ICD-10-CM

## 2023-05-25 DIAGNOSIS — R42 Dizziness and giddiness: Secondary | ICD-10-CM | POA: Diagnosis not present

## 2023-05-25 LAB — CBC
HCT: 33.5 % — ABNORMAL LOW (ref 36.0–46.0)
Hemoglobin: 11.1 g/dL — ABNORMAL LOW (ref 12.0–15.0)
MCH: 29.6 pg (ref 26.0–34.0)
MCHC: 33.1 g/dL (ref 30.0–36.0)
MCV: 89.3 fL (ref 80.0–100.0)
Platelets: 273 10*3/uL (ref 150–400)
RBC: 3.75 MIL/uL — ABNORMAL LOW (ref 3.87–5.11)
RDW: 12.5 % (ref 11.5–15.5)
WBC: 11.8 10*3/uL — ABNORMAL HIGH (ref 4.0–10.5)
nRBC: 0 % (ref 0.0–0.2)

## 2023-05-25 LAB — BASIC METABOLIC PANEL
Anion gap: 8 (ref 5–15)
BUN: 23 mg/dL — ABNORMAL HIGH (ref 6–20)
CO2: 19 mmol/L — ABNORMAL LOW (ref 22–32)
Calcium: 7.5 mg/dL — ABNORMAL LOW (ref 8.9–10.3)
Chloride: 109 mmol/L (ref 98–111)
Creatinine, Ser: 1.66 mg/dL — ABNORMAL HIGH (ref 0.44–1.00)
GFR, Estimated: 39 mL/min — ABNORMAL LOW (ref >60)
Glucose, Bld: 119 mg/dL — ABNORMAL HIGH (ref 70–99)
Potassium: 4 mmol/L (ref 3.5–5.1)
Sodium: 136 mmol/L (ref 135–145)

## 2023-05-25 LAB — GLUCOSE, CAPILLARY
Glucose-Capillary: 108 mg/dL — ABNORMAL HIGH (ref 70–99)
Glucose-Capillary: 302 mg/dL — ABNORMAL HIGH (ref 70–99)
Glucose-Capillary: 239 mg/dL — ABNORMAL HIGH (ref 70–99)

## 2023-05-25 MED ORDER — AMLODIPINE BESYLATE 10 MG PO TABS
10.0000 mg | ORAL_TABLET | Freq: Every day | ORAL | 0 refills | Status: DC
  Filled 2023-05-25: qty 30, 30d supply, fill #0

## 2023-05-25 MED ORDER — CLOPIDOGREL BISULFATE 75 MG PO TABS
75.0000 mg | ORAL_TABLET | Freq: Every day | ORAL | 0 refills | Status: AC
  Filled 2023-05-25: qty 19, 19d supply, fill #0

## 2023-05-25 MED ORDER — MOMETASONE FURO-FORMOTEROL FUM 100-5 MCG/ACT IN AERO: 2.0000 | INHALATION_SPRAY | Freq: Every day | RESPIRATORY_TRACT | Status: DC

## 2023-05-25 MED ORDER — FLUTICASONE PROPIONATE 50 MCG/ACT NA SUSP: 1.0000 | Freq: Every day | NASAL | Status: DC

## 2023-05-25 MED ORDER — ROSUVASTATIN CALCIUM 20 MG PO TABS
20.0000 mg | ORAL_TABLET | Freq: Every day | ORAL | 0 refills | Status: DC
  Filled 2023-05-25: qty 30, 30d supply, fill #0

## 2023-05-25 MED ORDER — FREESTYLE LIBRE 3 SENSOR MISC
0 refills | Status: DC
  Filled 2023-05-25: qty 2, 28d supply, fill #0
  Filled 2023-05-25: qty 2, fill #0
  Filled 2023-06-17 – 2023-06-25 (×2): qty 2, 28d supply, fill #0

## 2023-05-25 MED ORDER — ORAL CARE MOUTH RINSE: 15.0000 mL | OROMUCOSAL | Status: DC | PRN

## 2023-05-25 MED ORDER — ASPIRIN 81 MG PO TBEC: 81.0000 mg | DELAYED_RELEASE_TABLET | Freq: Every day | ORAL | Status: DC

## 2023-05-25 NOTE — Discharge Summary (Signed)
Physician Discharge Summary   Patient: Virginia [name] MRN: [id number] DOB: [birth year]  Admit date:     05/22/2023  Discharge date: 05/25/23  Discharge Physician: Lurene Shadow   PCP: [place], MD   Recommendations at discharge:   Follow-up with PCP in 1 week Follow-up with neurologist in 1 month  Discharge Diagnoses: Principal Problem:   Dizziness Active Problems:   Acute CVA (cerebrovascular accident) [facility])  Resolved Problems:   * No resolved hospital problems. [facility] Course:  Virginia [name] is a 47 y.o. female with medical history significant for hypertension, type 2 diabetes, who presented to the ED from home via EMS due to nausea vomiting chest pain and dizziness.  Describes her chest pain as sharp left-sided radiating to her left jaw.  Also endorses epigastric pain associated with nausea vomiting, and dizziness that started abruptly around noon.  She was also complaining of diplopia.  There was concern the patient may have had a neurological event such as TIA or stroke.  She was hospitalized for further management.    Assessment and Plan:  Acute stroke, MRI negative Patient was seen by neurology.  MRI was negative for stroke.  However neurology felt that she had MRI negative stroke. Echocardiogram shows normal systolic function. CT angiogram head and neck was done.  Defer findings to neurology. LDL is 86.  Patient started on rosuvastatin. HbA1c is 11.0.   Neurologist recommended aspirin and Plavix for 21 days followed by low-dose aspirin monotherapy. EEG does not show any seizure or epileptiform activity.   Intractable nausea and vomiting LFTs and lipase levels were normal.  CT scan of the abdomen pelvis along with chest did not show any acute findings.  She has had gastric bypass and cholecystectomy previously.  Symptoms improved   Dysuria Improved.  Suspect urinalysis was contaminated. She received 2 doses of IV ceftriaxone.   Chronic kidney  disease stage IIIa Unclear baseline.  Her renal function has fluctuated anywhere Between 1.0-2.2 over the last year and a half.   Creatinine at 1.66 but she had IV fluids overnight.  Creatinine expected to stabilize. Close follow-up in the outpatient setting recommended.   Essential hypertension Initially hypertensive encephalopathy was in the differential.  However neurology feels the patient has MRI negative stroke.  Permissive hypertension now being allowed.  Blood pressure trends have improved compared to yesterday morning.     Chest pain Probably GI in origin.  EKG without any ischemic changes.  Troponins were normal.  Resolved   Diabetes mellitus type 2, uncontrolled with hyperglycemia HbA1c is 11.0.  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   Continue Levemir at discharge.   Obesity Estimated body mass index is 33.07 kg/m as calculated from the following:   Height as of this encounter: 5\' 1"  (1.549 m).   Weight as of this encounter: 79.4 kg.    Her condition has improved.  She feels better and wants to go home today.  She requested continuous glucose monitor (CGM) at discharge.  Diabetes coordinator was consulted to assist with CGM.  The importance of medical adherence and close follow-up was reiterated.       Consultants: Neurologist Procedures performed: None Disposition: Home Diet recommendation:  Discharge Diet Orders (From admission, onward)     Start     Ordered   05/25/23 0000  Diet - low sodium heart healthy        05/25/23 1256   05/25/23 0000  Diet Carb Modified  05/25/23 1256           Cardiac and Carb modified diet DISCHARGE MEDICATION: Allergies as of 05/25/2023       Reactions   Ace Inhibitors Nausea And Vomiting, Cough   Banana Nausea And Vomiting   Lamictal [lamotrigine] Nausea And Vomiting   Morphine Itching   Nsaids Other (See Comments)   Kidney failure   Topiramate Nausea And Vomiting        Medication List     STOP taking  these medications    metFORMIN 1000 MG tablet Commonly known as: GLUCOPHAGE       TAKE these medications    acetaminophen 500 MG tablet Commonly known as: TYLENOL Take 1,000 mg by mouth daily as needed for mild pain or headache.   albuterol 108 (90 Base) MCG/ACT inhaler Commonly known as: VENTOLIN HFA Inhale 2 puffs every 4 (four) hours as needed into the lungs for wheezing or shortness of breath.   ALPRAZolam 1 MG tablet Commonly known as: XANAX Take 1 mg by mouth 3 (three) times daily as needed for anxiety.   amLODipine 10 MG tablet Commonly known as: NORVASC Take 1 tablet (10 mg total) by mouth daily. Start taking on: May 26, 2023   amphetamine-dextroamphetamine 30 MG tablet Commonly known as: Adderall Take 1 tablet by mouth 2 (two) times daily.   aspirin EC 81 MG tablet Take 1 tablet (81 mg total) by mouth daily. Swallow whole. Start taking on: May 26, 2023   clopidogrel 75 MG tablet Commonly known as: PLAVIX Take 1 tablet (75 mg total) by mouth daily for 19 days. Start taking on: May 26, 2023   FLUoxetine 40 MG capsule Commonly known as: PROZAC Take 40 mg by mouth daily.   fluticasone 50 MCG/ACT nasal spray Commonly known as: FLONASE Place 1 spray into both nostrils at bedtime.   FreeStyle Libre 3 Sensor Misc Place 1 sensor on the skin every 14 days. Use to check glucose continuously   insulin detemir 100 UNIT/ML FlexPen Commonly known as: LEVEMIR Inject 12 Units into the skin daily.   loratadine 10 MG tablet Commonly known as: CLARITIN Take 1 tablet (10 mg total) by mouth daily.   mometasone-formoterol 100-5 MCG/ACT Aero Commonly known as: DULERA Inhale 2 puffs into the lungs at bedtime.   montelukast 10 MG tablet Commonly known as: SINGULAIR Take 1 tablet (10 mg total) by mouth at bedtime.   rosuvastatin 20 MG tablet Commonly known as: CRESTOR Take 1 tablet (20 mg total) by mouth daily. Start taking on: May 26, 2023         Follow-up Information     [facility] transportation Follow up.   Why: Call 2-3 days in advance to schedule transportation for your appointments. Contact information: [phone]        [facility]. Schedule an appointment as soon as possible for a visit in 1 month(s).   Specialty: Neurology Why: stroke clinic Contact information: [address] [place] [address] [phone]               Discharge Exam: Ceasar Mons Weights   05/22/23 2253  Weight: 79.4 kg   GEN: NAD SKIN: Warm and dry EYES: No pallor or icterus ENT: MMM CV: RRR PULM: CTA B ABD: soft, obese, NT, +BS CNS: AAO x 3, non focal EXT: No edema or tenderness   Condition at discharge: good  The results of significant diagnostics from this hospitalization (including imaging, microbiology, ancillary  and laboratory) are listed below for reference.   Imaging Studies: MR BRAIN WO CONTRAST  Result Date: 05/24/2023 CLINICAL DATA:  Stroke follow-up. EXAM: MRI HEAD WITHOUT CONTRAST TECHNIQUE: Multiplanar, multiecho pulse sequences of the brain and surrounding structures were obtained without intravenous contrast. COMPARISON:  MRI study done yesterday. FINDINGS: Brain: Today's examination consists of axial and coronal diffusion imaging only. Diffusion imaging remains normal without evidence of acute or subacute stroke or other cause of restricted diffusion. No change in ventricular size. No sign of extra-axial fluid collection. Vascular: Major vessels at the base of the brain continue to show flow. Skull and upper cervical spine: No abnormality seen. Sinuses/Orbits: No abnormality seen. Other: None IMPRESSION: Today's study consists of axial and coronal diffusion imaging only. No evidence of acute or subacute infarction. Electronically Signed   By: Paulina Fusi M.D.   On: 05/24/2023 15:11   ECHOCARDIOGRAM COMPLETE  Result Date: 05/23/2023    ECHOCARDIOGRAM REPORT    Patient Name:   CAZANDRA TURO [facility] Date of Exam: 05/23/2023 Medical Rec #:  [id number]             Height:       61.0 in Accession #:    [id number]            Weight:       175.0 lb Date of Birth:  December 29, 1975              BSA:          1.785 m Patient Age:    46 years              BP:           149/95 mmHg Patient Gender: F                     HR:           74 bpm. Exam Location:  Inpatient Procedure: 2D Echo, Cardiac Doppler and Color Doppler Indications:    CVA  History:        Patient has prior history of Echocardiogram examinations, most                 recent 03/02/2023. Risk Factors:Hypertension, Diabetes and                 Dyslipidemia.  Sonographer:    Melissa Morford RDCS (AE, PE) Referring Phys: [id number] CAROLE N HALL  Sonographer Comments: No subcostal window. IMPRESSIONS  1. Left ventricular ejection fraction, by estimation, is 60 to 65%. The left ventricle has normal function. The left ventricle has no regional wall motion abnormalities. There is moderate to severe concentric left ventricular hypertrophy. Left ventricular diastolic parameters are consistent with Grade I diastolic dysfunction (impaired relaxation).  2. Right ventricular systolic function is normal. The right ventricular size is normal.  3. The mitral valve is normal in structure. No evidence of mitral valve regurgitation.  4. The aortic valve is tricuspid. Aortic valve regurgitation is not visualized. No aortic stenosis is present.  5. Given moderate to severe LVH, consider CMR to assess for infiltrative cardiomyopathy (was suggested on prior study as well). Comparison(s): No significant change from prior study. Conclusion(s)/Recommendation(s): No intracardiac source of embolism detected on this transthoracic study. Consider a transesophageal echocardiogram to exclude cardiac source of embolism if clinically indicated. FINDINGS  Left Ventricle: Left ventricular ejection fraction, by estimation, is 60 to 65%. The left ventricle has normal  function. The left ventricle has no regional wall  motion abnormalities. The left ventricular internal cavity size was normal in size. There is  moderate to severe concentric left ventricular hypertrophy. Left ventricular diastolic parameters are consistent with Grade I diastolic dysfunction (impaired relaxation). Right Ventricle: The right ventricular size is normal. No increase in right ventricular wall thickness. Right ventricular systolic function is normal. Left Atrium: Left atrial size was normal in size. Right Atrium: Right atrial size was normal in size. Pericardium: There is no evidence of pericardial effusion. Mitral Valve: The mitral valve is normal in structure. No evidence of mitral valve regurgitation. Tricuspid Valve: The tricuspid valve is normal in structure. Tricuspid valve regurgitation is trivial. Aortic Valve: The aortic valve is tricuspid. Aortic valve regurgitation is not visualized. No aortic stenosis is present. Pulmonic Valve: The pulmonic valve was normal in structure. Pulmonic valve regurgitation is trivial. Aorta: The aortic root is normal in size and structure. Venous: The inferior vena cava was not well visualized. IAS/Shunts: The atrial septum is grossly normal.  LEFT VENTRICLE PLAX 2D LVIDd:         3.90 cm   Diastology LVIDs:         2.70 cm   LV e' medial:    4.79 cm/s LV PW:         1.10 cm   LV E/e' medial:  15.3 LV IVS:        0.90 cm   LV e' lateral:   5.87 cm/s LVOT diam:     2.00 cm   LV E/e' lateral: 12.5 LV SV:         64 LV SV Index:   36 LVOT Area:     3.14 cm  RIGHT VENTRICLE RV S prime:     9.68 cm/s TAPSE (M-mode): 2.3 cm LEFT ATRIUM             Index        RIGHT ATRIUM           Index LA diam:        4.00 cm 2.24 cm/m   RA Area:     12.70 cm LA Vol (A2C):   51.8 ml 29.02 ml/m  RA Volume:   29.20 ml  16.36 ml/m LA Vol (A4C):   33.0 ml 18.49 ml/m LA Biplane Vol: 44.0 ml 24.65 ml/m  AORTIC VALVE LVOT Vmax:   89.50 cm/s LVOT Vmean:  68.500 cm/s LVOT VTI:    0.204 m   AORTA Ao Root diam: 2.40 cm MITRAL VALVE               TRICUSPID VALVE MV Area (PHT): 3.93 cm    TR Peak grad:   13.8 mmHg MV E velocity: 73.50 cm/s  TR Vmax:        186.00 cm/s MV A velocity: 96.10 cm/s MV E/A ratio:  0.76        SHUNTS                            Systemic VTI:  0.20 m                            Systemic Diam: 2.00 cm Laurance Flatten MD Electronically signed by Laurance Flatten MD Signature Date/Time: 05/23/2023/12:33:46 PM    Final    EEG adult  Result Date: 05/23/2023 Charlsie Quest, MD     05/23/2023  1:10 PM Patient Name: Virginia [name] MRN:  [id number] Epilepsy Attending: Charlsie Quest Referring Physician/Provider: Osvaldo Shipper, MD Date: 05/23/2023 Duration: 23.42 mins Patient history: 47yo F with dizziness getting eeg to evaluate for seizure. Level of alertness: Awake, asleep AEDs during EEG study: None Technical aspects: This EEG study was done with scalp electrodes positioned according to the 10-20 International system of electrode placement. Electrical activity was reviewed with band pass filter of 1-70Hz , sensitivity of 7 uV/mm, display speed of 72mm/sec with a 60Hz  notched filter applied as appropriate. EEG data were recorded continuously and digitally stored.  Video monitoring was available and reviewed as appropriate. Description: The posterior dominant rhythm consists of 8-9 Hz activity of moderate voltage (25-35 uV) seen predominantly in posterior head regions, symmetric and reactive to eye opening and eye closing. Sleep was characterized by vertex waves, sleep spindles (12 to 14 Hz), maximal frontocentral region. Hyperventilation and photic stimulation were not performed.   IMPRESSION: This study is within normal limits. No seizures or epileptiform discharges were seen throughout the recording. A normal interictal EEG does not exclude the diagnosis of epilepsy. Charlsie Quest   MR BRAIN WO CONTRAST  Result Date: 05/23/2023 CLINICAL DATA:  Neuro deficit,  acute, stroke suspected diplopia, right eye will not cross midline, dizziness. EXAM: MRI HEAD WITHOUT CONTRAST TECHNIQUE: Multiplanar, multiecho pulse sequences of the brain and surrounding structures were obtained without intravenous contrast. COMPARISON:  CTA head/neck 05/23/2023. FINDINGS: Brain: Motion degraded study. Within this limitation, no acute infarct or hemorrhage. Mild chronic small-vessel disease. No mass or midline shift. No hydrocephalus or extra-axial collection. No abnormal susceptibility. Vascular: Normal flow voids. Skull and upper cervical spine: Normal marrow signal. Sinuses/Orbits: Unremarkable. Other: None. IMPRESSION: Motion degraded study. Within this limitation, no acute intracranial process. Electronically Signed   By: Orvan Falconer M.D.   On: 05/23/2023 09:39   CT ANGIO HEAD NECK W WO CM  Result Date: 05/23/2023 CLINICAL DATA:  Initial evaluation for mental status change. Concern for stroke. EXAM: CT ANGIOGRAPHY HEAD AND NECK WITH AND WITHOUT CONTRAST TECHNIQUE: Multidetector CT imaging of the head and neck was performed using the standard protocol during bolus administration of intravenous contrast. Multiplanar CT image reconstructions and MIPs were obtained to evaluate the vascular anatomy. Carotid stenosis measurements (when applicable) are obtained utilizing NASCET criteria, using the distal internal carotid diameter as the denominator. RADIATION DOSE REDUCTION: This exam was performed according to the departmental dose-optimization program which includes automated exposure control, adjustment of the mA and/or kV according to patient size and/or use of iterative reconstruction technique. CONTRAST:  60mL OMNIPAQUE IOHEXOL 350 MG/ML SOLN COMPARISON:  Prior study from 04/18/2018. FINDINGS: CT HEAD FINDINGS Brain: Some contrast material remains on board from CTA performed earlier the same day. Cerebral volume within normal limits for patient age. No evidence for acute intracranial  hemorrhage. No findings to suggest acute large vessel territory infarct. No mass lesion, midline shift, or mass effect. Ventricles are normal in size without evidence for hydrocephalus. No extra-axial fluid collection identified. Vascular: Contrast material seen throughout the intracranial vasculature. Skull: Scalp soft tissues demonstrate no acute abnormality. Calvarium intact. Sinuses/Orbits: Globes and orbital soft tissues within normal limits. Visualized paranasal sinuses are clear. No mastoid effusion. CTA NECK FINDINGS Aortic arch: Standard branching. Imaged portion shows no evidence of aneurysm or dissection. No significant stenosis of the major arch vessel origins. Right carotid system: No evidence of dissection, stenosis (50% or greater), or occlusion. Left carotid system: No evidence of dissection, stenosis (50% or greater), or occlusion. Vertebral arteries: Right vertebral  artery dominant. Left vertebral artery hypoplastic and arises directly from the aortic arch. No dissection or stenosis. Skeleton: No discrete or worrisome osseous lesions. Other neck: No other acute finding within the neck. Upper chest: No acute finding within the visualized upper chest. Review of the MIP images confirms the above findings CTA HEAD FINDINGS Anterior circulation: Both internal carotid arteries are widely patent through the siphons without stenosis or other abnormality. Right A1 segment widely patent. Left A1 hypoplastic and/or absent. Tiny 1-2 mm outpouching arising from the anterior communicating canning artery complex felt to be most consistent with a small vascular infundibulum, likely related to a hypoplastic left A1 segment (series 11, image 109). Both ACAs widely patent distally without stenosis. No M1 stenosis or occlusion. No proximal MCA branch occlusion or high-grade stenosis. Distal MCA branches perfused and symmetric. Posterior circulation: Dominant right V4 segment widely patent. Left V4 segment hypoplastic  and patent as well. Both PICA patent at their origins. Basilar patent without stenosis. Superior cerebral arteries patent bilaterally. Right PCA supplied via the basilar. Fetal type origin of the left PCA. Both PCAs are widely patent to their distal aspects without stenosis. Venous sinuses: Patent allowing for timing the contrast bolus. Anatomic variants: As above.  No aneurysm. Review of the MIP images confirms the above findings IMPRESSION: 1. Normal CTA of the head and neck. No large vessel occlusion or other emergent finding. No hemodynamically significant or correctable stenosis. 2. No other acute intracranial abnormality. Electronically Signed   By: Rise Mu M.D.   On: 05/23/2023 03:42   CT Angio Chest/Abd/Pel for Dissection W and/or W/WO  Result Date: 05/23/2023 CLINICAL DATA:  Acute aortic syndrome EXAM: CT ANGIOGRAPHY CHEST, ABDOMEN AND PELVIS TECHNIQUE: Non-contrast CT of the chest was initially obtained. Multidetector CT imaging through the chest, abdomen and pelvis was performed using the standard protocol during bolus administration of intravenous contrast. Multiplanar reconstructed images and MIPs were obtained and reviewed to evaluate the vascular anatomy. RADIATION DOSE REDUCTION: This exam was performed according to the departmental dose-optimization program which includes automated exposure control, adjustment of the mA and/or kV according to patient size and/or use of iterative reconstruction technique. CONTRAST:  75mL OMNIPAQUE IOHEXOL 350 MG/ML SOLN COMPARISON:  03/01/2023 FINDINGS: CTA CHEST FINDINGS Cardiovascular: Preferential opacification of the thoracic aorta. No evidence of thoracic aortic aneurysm or dissection. Normal heart size. No pericardial effusion. Mediastinum/Nodes: Visualized thyroid is unremarkable. The esophagus is patulous but is otherwise unremarkable. No pathologic adenopathy within the thorax. Lungs/Pleura: Lungs are clear. No pleural effusion or  pneumothorax. Musculoskeletal: No acute bone abnormality. No lytic or blastic bone lesion. Review of the MIP images confirms the above findings. CTA ABDOMEN AND PELVIS FINDINGS VASCULAR Aorta: Normal caliber aorta without aneurysm, dissection, vasculitis or significant stenosis. Celiac: Patent without evidence of aneurysm, dissection, vasculitis or significant stenosis. Note is made of an accessory left hepatic artery. SMA: Patent without evidence of aneurysm, dissection, vasculitis or significant stenosis. Note is made of an replaced right hepatic artery. Renals: Both renal arteries are patent without evidence of aneurysm, dissection, vasculitis, fibromuscular dysplasia or significant stenosis. IMA: Patent without evidence of aneurysm, dissection, vasculitis or significant stenosis. Inflow: Patent without evidence of aneurysm, dissection, vasculitis or significant stenosis. Veins: No obvious venous abnormality within the limitations of this arterial phase study. Review of the MIP images confirms the above findings. NON-VASCULAR Hepatobiliary: No focal liver abnormality is seen. Status post cholecystectomy. No intrahepatic biliary ductal dilation. Mild extrahepatic biliary ductal dilation which tapers toward the ampulla  is again identified likely representing post cholecystectomy change. Pancreas: Unremarkable Spleen: Unremarkable Adrenals/Urinary Tract: The adrenal glands are unremarkable. The kidneys are normal save for mild right cortical scarring. Small nondependent gas is seen within the bladder lumen which may relate to recent catheterization. The bladder is otherwise unremarkable. Stomach/Bowel: Surgical changes of gastric bypass are identified. The stomach, small bowel, and large bowel are otherwise unremarkable. No evidence of obstruction or focal inflammation. Appendix normal. No free intraperitoneal gas or fluid Lymphatic: No pathologic adenopathy within the abdomen and pelvis. Reproductive: Uterus and  bilateral adnexa are unremarkable. Other: No no abdominal wall hernia Musculoskeletal: No acute bone abnormality. No lytic or blastic bone lesion. Review of the MIP images confirms the above findings. IMPRESSION: 1. No acute intrathoracic or intra-abdominal pathology identified. No thoracoabdominal aortic aneurysm or dissection. 2. Surgical changes of gastric bypass and cholecystectomy. Electronically Signed   By: Helyn Numbers M.D.   On: 05/23/2023 01:02   DG Chest Portable 1 View  Result Date: 05/22/2023 CLINICAL DATA:  Chest pain and vomiting. EXAM: PORTABLE CHEST 1 VIEW COMPARISON:  March 01, 2023 FINDINGS: The heart size and mediastinal contours are within normal limits. Both lungs are clear. The visualized skeletal structures are unremarkable. IMPRESSION: No active disease. Electronically Signed   By: Aram Candela M.D.   On: 05/22/2023 23:32    Microbiology: Results for orders placed or performed during the hospital encounter of 03/01/23  Respiratory (~20 pathogens) panel by PCR     Status: None   Collection Time: 03/03/23  4:20 PM   Specimen: Nasopharyngeal Swab; Respiratory  Result Value Ref Range Status   Adenovirus NOT DETECTED NOT DETECTED Final   Coronavirus 229E NOT DETECTED NOT DETECTED Final    Comment: (NOTE) The Coronavirus on the Respiratory Panel, DOES NOT test for the novel  Coronavirus (2019 nCoV)    Coronavirus HKU1 NOT DETECTED NOT DETECTED Final   Coronavirus NL63 NOT DETECTED NOT DETECTED Final   Coronavirus OC43 NOT DETECTED NOT DETECTED Final   Metapneumovirus NOT DETECTED NOT DETECTED Final   Rhinovirus / Enterovirus NOT DETECTED NOT DETECTED Final   Influenza A NOT DETECTED NOT DETECTED Final   Influenza B NOT DETECTED NOT DETECTED Final   Parainfluenza Virus 1 NOT DETECTED NOT DETECTED Final   Parainfluenza Virus 2 NOT DETECTED NOT DETECTED Final   Parainfluenza Virus 3 NOT DETECTED NOT DETECTED Final   Parainfluenza Virus 4 NOT DETECTED NOT DETECTED  Final   Respiratory Syncytial Virus NOT DETECTED NOT DETECTED Final   Bordetella pertussis NOT DETECTED NOT DETECTED Final   Bordetella Parapertussis NOT DETECTED NOT DETECTED Final   Chlamydophila pneumoniae NOT DETECTED NOT DETECTED Final   Mycoplasma pneumoniae NOT DETECTED NOT DETECTED Final    Comment: Performed at [facility], [address] [place], [place] [address]    Labs: CBC: Recent Labs  Lab 05/22/23 2300 05/23/23 0005 05/23/23 6045 05/24/23 0436 05/25/23 0226  WBC 10.3  --  12.7* 11.4* 11.8*  HGB 12.1 13.6 11.9* 11.6* 11.1*  HCT 37.6 40.0 36.6 35.3* 33.5*  MCV 89.1  --  88.4 87.8 89.3  PLT 232  --  245 232 273   Basic Metabolic Panel: Recent Labs  Lab 05/22/23 2300 05/23/23 0005 05/23/23 0635 05/24/23 0436 05/25/23 0226  NA 133* 125* 131* 138 136  K 4.9 >8.5* 5.4* 3.6 4.0  CL 105  --  103 107 109  CO2 18*  --  16* 20* 19*  GLUCOSE 334*  --  343* 102* 119*  BUN 17  --  18 20 23*  CREATININE 1.17*  --  1.26* 1.59* 1.66*  CALCIUM 8.1*  --  7.9* 7.8* 7.5*   Liver Function Tests: Recent Labs  Lab 05/22/23 2300 05/24/23 0436  AST 23 26  ALT 24 24  ALKPHOS 98 87  BILITOT 1.0 1.0  PROT 6.8 6.4*  ALBUMIN 2.9* 2.6*   CBG: Recent Labs  Lab 05/24/23 2039 05/24/23 2333 05/25/23 0359 05/25/23 0648 05/25/23 1152  GLUCAP 193* 137* 108* 302* 239*    Discharge time spent: greater than 30 minutes.  Signed: Lurene Shadow, MD [facility] 05/25/2023

## 2023-05-25 NOTE — Inpatient Diabetes Management (Signed)
Inpatient Diabetes Program Recommendations  AACE/ADA: New Consensus Statement on Inpatient Glycemic Control (2015)  Target Ranges:  Prepandial:   less than 140 mg/dL      Peak postprandial:   less than 180 mg/dL (1-2 hours)      Critically ill patients:  140 - 180 mg/dL   Lab Results  Component Value Date   GLUCAP 239 (H) 05/25/2023   HGBA1C 11.0 (H) 05/23/2023   MD ordered application of Freestyle CGM at discharge for patient. Education done regarding application and changing CGM sensor (alternate every 14 days on back of arms), 1 hour warm-up, use of glucometer when alert displays, how to scan CGM for glucose reading and information for PCP. Patient has also been given educational packet regarding use CGM sensor including the 1-800 toll free number for any questions, problems or needs related to the [facility] sensors or reader.    Sensor applied by patient to (L) Arm at 130p.  Explained that glucose readings will not be available until 1 hour after application. Reviewed use of CGM including how to scan, changing Sensor, Vitamin C warning, arrows with glucose readings, and Freestyle app.  Patient very appreciative.   Pharmacy tech did prior authorization for [place] 3 with results zero copay.  Thank you, Billy Fischer. Domino Holten, RN, MSN, CDE  Diabetes Coordinator Inpatient Glycemic Control Team Team Pager [phone] (8am-5pm) 05/25/2023 1:34 PM

## 2023-05-25 NOTE — Progress Notes (Signed)
Discharge instructions given. Patient verbalized understanding and all questions were answered.  ?

## 2023-05-25 NOTE — Discharge Instructions (Addendum)
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 PCP - please consider assessing micronutrient labs to check for deficiencies and prescribing an appropriate multivitamin regimen.   Roux-en-y lifelong vitamin recommendations  MVI + minerals (MVI=multivitamin)  Recommended  Iron (not taken with calcium)  45-60 mg total  Folic Acid   400-800 mg in MVI  Thiamin   >12 mg in MVI  Copper   2 mg in MVI (200% RDA)  Zinc   8-22 mg in MVI (100-200% RDA)  Zinc to Copper Ratio    8-15 mg zinc to copper per 1 mg copper  B12 (nasal spray per manufacturer)  Oral tablet, sublingual, or liquid: 350-500 mcg/day  Vitamin D   Oral: 3000 IU  Vitamin A   5,000-10,000 IU  Vitamin E   15 mg  Vitamin K   90-120 mcg  Calcium (carbonate or citrate) 1200-1500 mg divided into 500 mg doses with two hours between

## 2023-05-25 NOTE — Telephone Encounter (Signed)
Pharmacy Patient Advocate Encounter  Received notification from [employer] that Prior Authorization for [facility] 3 Sensors has been APPROVED from 05/25/2023 to 11/21/2023.Marland Kitchen  PA #/Case ID/Reference #: [id number] Key: [id number]  Current copay is $0.00

## 2023-05-25 NOTE — TOC Transition Note (Addendum)
Transition of Care [facility]) - CM/SW Discharge Note   Patient Details  Name: Virginia [name] MRN: [id number] Date of Birth: 05/05/1976  [facility] CM/SW Contact:  Kermit Balo, RN Phone Number: 05/25/2023, 1:00 PM   Clinical Narrative:    Pt is discharging home with self care. Pt refused therapy outpt.  Discharge medications to be delivered to the room per [facility] pharmacy. Pt stated she has transport home.   Final next level of care: Home/Self Care Barriers to Discharge: No Barriers Identified   Patient Goals and CMS Choice      Discharge Placement                         Discharge Plan and Services Additional resources added to the After Visit Summary for     Discharge Planning Services: CM Consult                                 Social Determinants of Health (SDOH) Interventions SDOH Screenings   Food Insecurity: Food Insecurity Present (05/23/2023)  Housing: Low Risk  (05/23/2023)  Transportation Needs: Unmet Transportation Needs (05/23/2023)  Utilities: At Risk (05/23/2023)  Tobacco Use: Medium Risk (05/22/2023)     Readmission Risk Interventions     No data to display

## 2023-05-25 NOTE — Plan of Care (Signed)
  Problem: Education: Goal: Ability to describe self-care measures that may prevent or decrease complications (Diabetes Survival Skills Education) will improve Outcome: Progressing   Problem: Coping: Goal: Ability to adjust to condition or change in health will improve Outcome: Progressing   

## 2023-05-25 NOTE — Progress Notes (Signed)
Physical Therapy Treatment Patient Details Name: Virginia [name] MRN: [id number] DOB: 1976/04/16 Today's Date: 05/25/2023   History of Present Illness 47 y.o. female presents to [facility] hospital on 05/22/2023 with nausea, vomiting, chest pain, and dizziness. CT/MRI brain negative. PMH includes HTN, DMII, bipolar affective, depression.    PT Comments    Pt tolerates treatment well, reporting feeling much better and with reduced symptoms. During session pt reports feelings of instability with quick changes in direction or head turns, but denies dizziness. Pt is able to ambulate for household distances, education provided on changing directions slowly, as well as gaze stabilization to reduce risk for imbalance or dizziness. PT recommends discharge home with outpatient vestibular PT when medically appropriate.   Recommendations for follow up therapy are one component of a multi-disciplinary discharge planning process, led by the attending physician.  Recommendations may be updated based on patient status, additional functional criteria and insurance authorization.  Follow Up Recommendations       Assistance Recommended at Discharge Frequent or constant Supervision/Assistance  Patient can return home with the following A little help with bathing/dressing/bathroom;Assistance with cooking/housework;Help with stairs or ramp for entrance;Assist for transportation   Equipment Recommendations  None recommended by PT    Recommendations for Other Services       Precautions / Restrictions Precautions Precautions: Fall Restrictions Weight Bearing Restrictions: No     Mobility  Bed Mobility Overal bed mobility: Independent                  Transfers Overall transfer level: Independent Equipment used: None Transfers: Sit to/from Stand Sit to Stand: Independent                Ambulation/Gait Ambulation/Gait assistance: Supervision Gait Distance (Feet): 250 Feet Assistive  device: None Gait Pattern/deviations: Step-through pattern, Drifts right/left Gait velocity: reduced Gait velocity interpretation: <1.8 ft/sec, indicate of risk for recurrent falls   General Gait Details: lateral drift with head turns or quick changes in direction, pt with good awareness of this   Stairs Stairs: Yes Stairs assistance: Min guard Stair Management: Forwards, Step to pattern (hand hold) Number of Stairs: 3     Wheelchair Mobility    Modified Rankin (Stroke Patients Only) Modified Rankin (Stroke Patients Only) Pre-Morbid Rankin Score: No symptoms Modified Rankin: Moderately severe disability     Balance Overall balance assessment: Needs assistance Sitting-balance support: No upper extremity supported, Feet supported Sitting balance-Leahy Scale: Good     Standing balance support: No upper extremity supported, During functional activity Standing balance-Leahy Scale: Fair                              Cognition Arousal/Alertness: Awake/alert Behavior During Therapy: WFL for tasks assessed/performed Overall Cognitive Status: Within Functional Limits for tasks assessed                                          Exercises Other Exercises Other Exercises: PT provides education on seated VOR x1 exercise for habituation    General Comments General comments (skin integrity, edema, etc.): VSS on RA      Pertinent Vitals/Pain Pain Assessment Pain Assessment: No/denies pain    Home Living                          Prior Function  PT Goals (current goals can now be found in the care plan section) Acute Rehab PT Goals Patient Stated Goal: to go home Progress towards PT goals: Progressing toward goals    Frequency    Min 3X/week      PT Plan Current plan remains appropriate    Co-evaluation              AM-PAC PT "6 Clicks" Mobility   Outcome Measure  Help needed turning from your back to  your side while in a flat bed without using bedrails?: None Help needed moving from lying on your back to sitting on the side of a flat bed without using bedrails?: None Help needed moving to and from a bed to a chair (including a wheelchair)?: None Help needed standing up from a chair using your arms (e.g., wheelchair or bedside chair)?: None Help needed to walk in hospital room?: A Little Help needed climbing 3-5 steps with a railing? : A Little 6 Click Score: 22    End of Session   Activity Tolerance: Patient tolerated treatment well Patient left: in bed;with call bell/phone within reach Nurse Communication: Mobility status PT Visit Diagnosis: Other abnormalities of gait and mobility (R26.89);Muscle weakness (generalized) (M62.81);Other symptoms and signs involving the nervous system (R29.898)     Time: 1610-9604 PT Time Calculation (min) (ACUTE ONLY): 18 min  Charges:  $Gait Training: 8-22 mins                     Arlyss Gandy, PT, DPT Acute Rehabilitation Office [phone]    Arlyss Gandy 05/25/2023, 12:46 PM

## 2023-06-06 ENCOUNTER — Other Ambulatory Visit (HOSPITAL_COMMUNITY): Payer: Self-pay

## 2023-06-17 ENCOUNTER — Other Ambulatory Visit (HOSPITAL_COMMUNITY): Payer: Self-pay

## 2023-06-18 ENCOUNTER — Other Ambulatory Visit (HOSPITAL_COMMUNITY): Payer: Self-pay

## 2023-06-20 ENCOUNTER — Other Ambulatory Visit (HOSPITAL_COMMUNITY): Payer: Self-pay

## 2023-06-22 ENCOUNTER — Other Ambulatory Visit (HOSPITAL_COMMUNITY): Payer: Self-pay

## 2023-06-25 ENCOUNTER — Other Ambulatory Visit (HOSPITAL_COMMUNITY): Payer: Self-pay

## 2023-06-27 ENCOUNTER — Other Ambulatory Visit (HOSPITAL_COMMUNITY): Payer: Self-pay

## 2023-06-27 ENCOUNTER — Other Ambulatory Visit: Payer: Self-pay

## 2023-06-29 ENCOUNTER — Other Ambulatory Visit (HOSPITAL_COMMUNITY): Payer: Self-pay

## 2023-07-01 ENCOUNTER — Other Ambulatory Visit (HOSPITAL_COMMUNITY): Payer: Self-pay

## 2023-07-04 ENCOUNTER — Telehealth: Payer: Self-pay

## 2023-07-04 NOTE — Telephone Encounter (Signed)
..   Medicaid Managed Care   Unsuccessful Outreach Note  07/04/2023 Name: Virginia [name] MRN: [id number] DOB: 10-10-1976  Referred by: Harvest Forest, MD Reason for referral : Appointment (I attempted to reach the patient today to get her scheduled with the MM Team. I left a detailed message on her phone.)   An unsuccessful telephone outreach was attempted today. The patient was referred to the case management team for assistance with care management and care coordination.   Follow Up Plan: A HIPAA compliant phone message was left for the patient providing contact information and requesting a return call.  The care management team will reach out to the patient again over the next 7 days.   Weston Settle Care Guide  [facility] Managed  Care Guide [facility]  [phone]

## 2023-07-05 ENCOUNTER — Other Ambulatory Visit (HOSPITAL_COMMUNITY): Payer: Self-pay

## 2023-07-06 ENCOUNTER — Other Ambulatory Visit (HOSPITAL_COMMUNITY): Payer: Self-pay

## 2023-07-07 ENCOUNTER — Other Ambulatory Visit (HOSPITAL_COMMUNITY): Payer: Self-pay

## 2023-07-10 ENCOUNTER — Other Ambulatory Visit (HOSPITAL_COMMUNITY): Payer: Self-pay

## 2023-07-13 ENCOUNTER — Telehealth: Payer: Self-pay

## 2023-07-13 NOTE — Telephone Encounter (Signed)
..   Medicaid Managed Care   Unsuccessful Outreach Note  07/13/2023 Name: Deeandra Heling MRN: [id number] DOB: August 20, 1976  Referred by: Harvest Forest, MD Reason for referral : Appointment   A second unsuccessful telephone outreach was attempted today. The patient was referred to the case management team for assistance with care management and care coordination.   Follow Up Plan: The care management team will reach out to the patient again over the next 7 days.   Weston Settle Care Guide  [facility] Managed  Care Guide [facility]  [phone]

## 2023-07-14 ENCOUNTER — Other Ambulatory Visit (HOSPITAL_COMMUNITY): Payer: Self-pay

## 2023-07-15 ENCOUNTER — Other Ambulatory Visit (HOSPITAL_COMMUNITY): Payer: Self-pay

## 2023-07-18 ENCOUNTER — Telehealth: Payer: Self-pay

## 2023-07-18 ENCOUNTER — Other Ambulatory Visit (HOSPITAL_COMMUNITY): Payer: Self-pay

## 2023-07-18 NOTE — Telephone Encounter (Signed)
..   Medicaid Managed Care   Unsuccessful Outreach Note  07/18/2023 Name: Virginia [name] MRN: [id number] DOB: [birth year]  Referred by: Harvest Forest, MD Reason for referral : Appointment   Third unsuccessful telephone outreach was attempted today. The patient was referred to the case management team for assistance with care management and care coordination. The patient's primary care provider has been notified of our unsuccessful attempts to make or maintain contact with the patient. The care management team is pleased to engage with this patient at any time in the future should he/she be interested in assistance from the care management team.   Follow Up Plan: We have been unable to make contact with the patient for follow up. The care management team is available to follow up with the patient after provider conversation with the patient regarding recommendation for care management engagement and subsequent re-referral to the care management team.   Weston Settle Care Guide  [facility] Managed  Care Guide [facility]  [phone]

## 2023-07-22 ENCOUNTER — Encounter (HOSPITAL_COMMUNITY): Payer: Self-pay

## 2023-07-22 ENCOUNTER — Other Ambulatory Visit (HOSPITAL_COMMUNITY): Payer: Self-pay

## 2023-07-23 ENCOUNTER — Other Ambulatory Visit (HOSPITAL_COMMUNITY): Payer: Self-pay

## 2023-07-25 ENCOUNTER — Other Ambulatory Visit (HOSPITAL_COMMUNITY): Payer: Self-pay

## 2023-07-31 ENCOUNTER — Encounter (HOSPITAL_COMMUNITY): Payer: Self-pay

## 2023-07-31 ENCOUNTER — Emergency Department (HOSPITAL_COMMUNITY)
Admission: EM | Admit: 2023-07-31 | Discharge: 2023-07-31 | Disposition: A | Discharge status: Home / Self Care | Payer: Medicaid Other | Attending: Emergency Medicine | Admitting: Emergency Medicine

## 2023-07-31 ENCOUNTER — Other Ambulatory Visit: Payer: Self-pay

## 2023-07-31 ENCOUNTER — Emergency Department (HOSPITAL_COMMUNITY): Payer: Medicaid Other

## 2023-07-31 DIAGNOSIS — R0689 Other abnormalities of breathing: Secondary | ICD-10-CM | POA: Diagnosis not present

## 2023-07-31 DIAGNOSIS — R112 Nausea with vomiting, unspecified: Secondary | ICD-10-CM | POA: Diagnosis not present

## 2023-07-31 DIAGNOSIS — R739 Hyperglycemia, unspecified: Secondary | ICD-10-CM | POA: Diagnosis not present

## 2023-07-31 DIAGNOSIS — R1084 Generalized abdominal pain: Secondary | ICD-10-CM | POA: Diagnosis not present

## 2023-07-31 DIAGNOSIS — R102 Pelvic and perineal pain: Secondary | ICD-10-CM | POA: Diagnosis not present

## 2023-07-31 DIAGNOSIS — R100 Acute abdomen: Secondary | ICD-10-CM | POA: Diagnosis not present

## 2023-07-31 DIAGNOSIS — R58 Hemorrhage, not elsewhere classified: Secondary | ICD-10-CM | POA: Diagnosis not present

## 2023-07-31 LAB — COMPREHENSIVE METABOLIC PANEL
ALT: 23 U/L (ref 0–44)
AST: 27 U/L (ref 15–41)
Albumin: 3 g/dL — ABNORMAL LOW (ref 3.5–5.0)
Alkaline Phosphatase: 101 U/L (ref 38–126)
Anion gap: 10 (ref 5–15)
BUN: 30 mg/dL — ABNORMAL HIGH (ref 6–20)
CO2: 17 mmol/L — ABNORMAL LOW (ref 22–32)
Calcium: 7.6 mg/dL — ABNORMAL LOW (ref 8.9–10.3)
Chloride: 109 mmol/L (ref 98–111)
Creatinine, Ser: 2.16 mg/dL — ABNORMAL HIGH (ref 0.44–1.00)
GFR, Estimated: 28 mL/min — ABNORMAL LOW (ref >60)
Glucose, Bld: 304 mg/dL — ABNORMAL HIGH (ref 70–99)
Potassium: 4.7 mmol/L (ref 3.5–5.1)
Sodium: 136 mmol/L (ref 135–145)
Total Bilirubin: 0.6 mg/dL (ref 0.3–1.2)
Total Protein: 6.5 g/dL (ref 6.5–8.1)

## 2023-07-31 LAB — LIPASE, BLOOD: Lipase: 30 U/L (ref 11–51)

## 2023-07-31 LAB — CBC
HCT: 30.7 % — ABNORMAL LOW (ref 36.0–46.0)
Hemoglobin: 10 g/dL — ABNORMAL LOW (ref 12.0–15.0)
MCH: 28.7 pg (ref 26.0–34.0)
MCHC: 32.6 g/dL (ref 30.0–36.0)
MCV: 88 fL (ref 80.0–100.0)
Platelets: 280 10*3/uL (ref 150–400)
RBC: 3.49 MIL/uL — ABNORMAL LOW (ref 3.87–5.11)
RDW: 12.8 % (ref 11.5–15.5)
WBC: 8.9 10*3/uL (ref 4.0–10.5)
nRBC: 0 % (ref 0.0–0.2)

## 2023-07-31 LAB — HCG, SERUM, QUALITATIVE: Preg, Serum: NEGATIVE

## 2023-07-31 LAB — HCG, QUANTITATIVE, PREGNANCY: hCG, Beta Chain, Quant, S: <1 m[IU]/mL (ref <5)

## 2023-07-31 MED ORDER — DIPHENHYDRAMINE HCL 50 MG/ML IJ SOLN
25.0000 mg | Freq: Once | INTRAMUSCULAR | Status: AC
  Administered 2023-07-31: 25 mg via INTRAVENOUS
  Filled 2023-07-31: qty 1

## 2023-07-31 MED ORDER — MORPHINE SULFATE (PF) 4 MG/ML IV SOLN
4.0000 mg | Freq: Once | INTRAVENOUS | Status: AC
  Administered 2023-07-31: 4 mg via INTRAVENOUS
  Filled 2023-07-31: qty 1

## 2023-07-31 MED ORDER — OXYCODONE HCL 5 MG PO TABS
5.0000 mg | ORAL_TABLET | Freq: Once | ORAL | Status: AC
  Administered 2023-07-31: 5 mg via ORAL
  Filled 2023-07-31: qty 1

## 2023-07-31 MED ORDER — OXYCODONE HCL 5 MG PO TABS: 5.0000 mg | ORAL_TABLET | Freq: Four times a day (QID) | ORAL | 0 refills | Status: DC | PRN

## 2023-07-31 MED ORDER — LACTATED RINGERS IV BOLUS
1000.0000 mL | Freq: Once | INTRAVENOUS | Status: AC
  Administered 2023-07-31: 1000 mL via INTRAVENOUS

## 2023-07-31 MED ORDER — ACETAMINOPHEN 500 MG PO TABS
1000.0000 mg | ORAL_TABLET | Freq: Once | ORAL | Status: AC
  Administered 2023-07-31: 1000 mg via ORAL
  Filled 2023-07-31: qty 2

## 2023-07-31 MED ORDER — ONDANSETRON HCL 4 MG PO TABS: 4.0000 mg | ORAL_TABLET | Freq: Four times a day (QID) | ORAL | 0 refills | Status: DC

## 2023-07-31 MED ORDER — ONDANSETRON HCL 4 MG/2ML IJ SOLN
4.0000 mg | Freq: Once | INTRAMUSCULAR | Status: AC
  Administered 2023-07-31: 4 mg via INTRAVENOUS
  Filled 2023-07-31: qty 2

## 2023-07-31 NOTE — ED Triage Notes (Signed)
Patient BIB EMS from home with c/o ABD with nausea since last night. Heavy mensual cramping. CBG 325. Patient have not taken insulin today. Zofran 4mg  IV. 20g right hand. 154/76, 104, 28, 100%ra, CO2 29-31

## 2023-07-31 NOTE — ED Provider Notes (Signed)
Virginia [name] EMERGENCY DEPARTMENT AT [facility] Provider Note   CSN: [id number] Arrival date & time: 07/31/23  1710     History Chief Complaint  Patient presents with   Nausea   Abdominal Pain    HPI Virginia [name] is a 47 y.o. female presenting for multiple complaints.  States that she is having some abdominal discomfort some nausea and vomiting feels dehydrated.  Is endorsing pelvic cramping and vaginal bleeding.  States she thinks it is just her menstrual cycle but that it is a couple days early.  She is otherwise ambulatory tolerating p.o. intake.  No medications prior to arrival.  Has not been taking her insulin at home for the past few days since her Dexcom sensor failed.   Patient's recorded medical, surgical, social, medication list and allergies were reviewed in the Snapshot window as part of the initial history.   Review of Systems   Review of Systems  Constitutional:  Positive for fatigue. Negative for chills and fever.  HENT:  Negative for ear pain and sore throat.   Eyes:  Negative for pain and visual disturbance.  Respiratory:  Negative for cough and shortness of breath.   Cardiovascular:  Negative for chest pain and palpitations.  Gastrointestinal:  Positive for nausea and vomiting. Negative for abdominal pain.  Genitourinary:  Negative for dysuria and hematuria.  Musculoskeletal:  Negative for arthralgias and back pain.  Skin:  Negative for color change and rash.  Neurological:  Negative for seizures and syncope.  All other systems reviewed and are negative.   Physical Exam Updated Vital Signs BP (!) 161/96 (BP Location: Left Arm)   Pulse (!) 103   Temp 98 F (36.7 C) (Oral)   Resp 19   SpO2 100%  Physical Exam Vitals and nursing note reviewed.  Constitutional:      General: She is not in acute distress.    Appearance: She is well-developed.  HENT:     Head: Normocephalic and atraumatic.  Eyes:     Conjunctiva/sclera: Conjunctivae  normal.  Cardiovascular:     Rate and Rhythm: Normal rate and regular rhythm.     Heart sounds: No murmur heard. Pulmonary:     Effort: Pulmonary effort is normal. No respiratory distress.     Breath sounds: Normal breath sounds.  Abdominal:     General: There is no distension.     Palpations: Abdomen is soft.     Tenderness: There is no abdominal tenderness. There is no right CVA tenderness or left CVA tenderness.  Musculoskeletal:        General: No swelling or tenderness. Normal range of motion.     Cervical back: Neck supple.  Skin:    General: Skin is warm and dry.  Neurological:     General: No focal deficit present.     Mental Status: She is alert and oriented to person, place, and time. Mental status is at baseline.     Cranial Nerves: No cranial nerve deficit.      ED Course/ Medical Decision Making/ A&P    Procedures Procedures   Medications Ordered in ED Medications  acetaminophen (TYLENOL) tablet 1,000 mg (has no administration in time range)  oxyCODONE (Oxy IR/ROXICODONE) immediate release tablet 5 mg (has no administration in time range)  lactated ringers bolus 1,000 mL (1,000 mLs Intravenous New Bag/Given 07/31/23 1756)  morphine (PF) 4 MG/ML injection 4 mg (4 mg Intravenous Given 07/31/23 1754)  diphenhydrAMINE (BENADRYL) injection 25 mg (25 mg Intravenous  Given 07/31/23 1754)  ondansetron (ZOFRAN) injection 4 mg (4 mg Intravenous Given 07/31/23 1801)    Medical Decision Making:    [name] is a 47 y.o. female who presented to the ED today with multiple complaints detailed above.     Complete initial physical exam performed, notably the patient  was hemodynamically stable in no acute distress.      Reviewed and confirmed nursing documentation for past medical history, family history, social history.    Initial Assessment:   With the patient's presentation of pelvic pain, most likely diagnosis is nonspecific etiology versus menstrual cycling.  Other diagnoses were considered including (but not limited to) ovarian torsion, endometriosis, fibroid. These are considered less likely due to history of present illness and physical exam findings.   This is most consistent with an acute life/limb threatening illness complicated by underlying chronic conditions. Concerning patient's fatigue and malaise, this is likely secondary to her hyperglycemia, dehydration and poor p.o. intake Initial Plan:  pelvic ultrasound to evaluate for intrapelvic emergency as well as beta-hCG to evaluate for pregnancy or ectopic pregnancy  Screening labs including CBC and Metabolic panel to evaluate for infectious or metabolic etiology of disease.  Urinalysis with reflex culture ordered to evaluate for UTI or relevant urologic/nephrologic pathology.  Lipase to evaluate for pancreatitis Objective evaluation as below reviewed with plan for close reassessment  Initial Study Results:   Laboratory  All laboratory results reviewed without evidence of clinically relevant pathology.      Radiology  All images reviewed independently. Agree with radiology report at this time.   US PELVIC COMPLETE W TRANSVAGINAL AND TORSION R/O  Result Date: 07/31/2023 CLINICAL DATA:  Pelvic pain EXAM: TRANSABDOMINAL AND TRANSVAGINAL ULTRASOUND OF PELVIS DOPPLER ULTRASOUND OF OVARIES TECHNIQUE: Both transabdominal and transvaginal ultrasound examinations of the pelvis were performed. Transabdominal technique was performed for global imaging of the pelvis including uterus, ovaries, adnexal regions, and pelvic cul-de-sac. It was necessary to proceed with endovaginal exam following the transabdominal exam to visualize the ovaries and endometrium. Color and duplex Doppler ultrasound was utilized to evaluate blood flow to the ovaries. COMPARISON:  CT chest abdomen and pelvis 05/23/2023 FINDINGS: Uterus Measurements: 6.7 x 3.7 x 4.5 cm = volume: 58 mL. No fibroids or other mass visualized. Endometrium  Thickness: 3.2 mm.  No focal abnormality visualized. Right ovary Measurements: 3.1 x 1.6 x 1.5 cm = volume: 3.8 mL. Normal appearance/no adnexal mass. Left ovary Measurements: 2.6 x 1.6 x 1.5 cm = volume: 3.2 mL. Normal appearance/no adnexal mass. Pulsed Doppler evaluation of both ovaries demonstrates normal low-resistance arterial and venous waveforms. Other findings No abnormal free fluid. IMPRESSION: No sonographic abnormality is seen in the pelvis. There is no evidence of ovarian torsion. Electronically Signed   By: Darliss Cheney M.D.   On: 07/31/2023 20:39    Reassessment and Plan:   Patient observed for 4 hours in the emergency room.  Hyperglycemia improving with IV fluids between EMS and our sample.  She denies fevers chills nausea vomiting or shortness of breath on reassessment. She is not an NSAID candidate because of her AKI.  Will prescribe pain control recommend she follow-up with OB for longer-term management of her severe pelvic pain.  Concerning her high blood sugar, she will start using her normal insulin supplies which she still has at home.  Rehydrated with IV fluids and symptomatically improved.     Disposition:  I have considered need for hospitalization, however, considering all of the above, I believe  this patient is stable for discharge at this time.  Patient/family educated about specific return precautions for given chief complaint and symptoms.  Patient/family educated about follow-up with PCP .     Patient/family expressed understanding of return precautions and need for follow-up. Patient spoken to regarding all imaging and laboratory results and appropriate follow up for these results. All education provided in verbal form with additional information in written form. Time was allowed for answering of patient questions. Patient discharged.    Emergency Department Medication Summary:   Medications  acetaminophen (TYLENOL) tablet 1,000 mg (has no administration in time range)   oxyCODONE (Oxy IR/ROXICODONE) immediate release tablet 5 mg (has no administration in time range)  lactated ringers bolus 1,000 mL (1,000 mLs Intravenous New Bag/Given 07/31/23 1756)  morphine (PF) 4 MG/ML injection 4 mg (4 mg Intravenous Given 07/31/23 1754)  diphenhydrAMINE (BENADRYL) injection 25 mg (25 mg Intravenous Given 07/31/23 1754)  ondansetron (ZOFRAN) injection 4 mg (4 mg Intravenous Given 07/31/23 1801)         Clinical Impression:  1. Nausea and vomiting, unspecified vomiting type   2. Pelvic pain      Discharge   Final Clinical Impression(s) / ED Diagnoses Final diagnoses:  Nausea and vomiting, unspecified vomiting type  Pelvic pain    Rx / DC Orders ED Discharge Orders          Ordered    ondansetron (ZOFRAN) 4 MG tablet  Every 6 hours        07/31/23 2121    oxyCODONE (ROXICODONE) 5 MG immediate release tablet  Every 6 hours PRN        07/31/23 2121              Glyn Ade, MD 07/31/23 2141

## 2023-08-01 ENCOUNTER — Other Ambulatory Visit (HOSPITAL_COMMUNITY): Payer: Self-pay

## 2023-08-09 ENCOUNTER — Other Ambulatory Visit (HOSPITAL_COMMUNITY): Payer: Self-pay

## 2023-08-29 ENCOUNTER — Other Ambulatory Visit (HOSPITAL_COMMUNITY): Payer: Self-pay

## 2023-11-01 ENCOUNTER — Other Ambulatory Visit (HOSPITAL_COMMUNITY): Payer: Self-pay

## 2024-02-03 ENCOUNTER — Inpatient Hospital Stay (HOSPITAL_COMMUNITY)
Admission: EM | Admit: 2024-02-03 | Discharge: 2024-02-07 | DRG: 194 | Disposition: A | Discharge status: Home / Self Care | Payer: Medicaid Other | Attending: Internal Medicine | Admitting: Internal Medicine

## 2024-02-03 ENCOUNTER — Emergency Department (HOSPITAL_COMMUNITY): Payer: Medicaid Other

## 2024-02-03 ENCOUNTER — Encounter (HOSPITAL_COMMUNITY): Payer: Self-pay | Admitting: Internal Medicine

## 2024-02-03 ENCOUNTER — Other Ambulatory Visit: Payer: Self-pay

## 2024-02-03 DIAGNOSIS — Z794 Long term (current) use of insulin: Secondary | ICD-10-CM | POA: Diagnosis not present

## 2024-02-03 DIAGNOSIS — J101 Influenza due to other identified influenza virus with other respiratory manifestations: Secondary | ICD-10-CM | POA: Diagnosis present

## 2024-02-03 DIAGNOSIS — Z9884 Bariatric surgery status: Secondary | ICD-10-CM

## 2024-02-03 DIAGNOSIS — E860 Dehydration: Secondary | ICD-10-CM | POA: Diagnosis present

## 2024-02-03 DIAGNOSIS — E872 Acidosis, unspecified: Secondary | ICD-10-CM | POA: Diagnosis present

## 2024-02-03 DIAGNOSIS — D631 Anemia in chronic kidney disease: Secondary | ICD-10-CM | POA: Diagnosis present

## 2024-02-03 DIAGNOSIS — Z8249 Family history of ischemic heart disease and other diseases of the circulatory system: Secondary | ICD-10-CM

## 2024-02-03 DIAGNOSIS — E119 Type 2 diabetes mellitus without complications: Secondary | ICD-10-CM

## 2024-02-03 DIAGNOSIS — E8809 Other disorders of plasma-protein metabolism, not elsewhere classified: Secondary | ICD-10-CM | POA: Diagnosis present

## 2024-02-03 DIAGNOSIS — I129 Hypertensive chronic kidney disease with stage 1 through stage 4 chronic kidney disease, or unspecified chronic kidney disease: Secondary | ICD-10-CM | POA: Diagnosis present

## 2024-02-03 DIAGNOSIS — E66811 Obesity, class 1: Secondary | ICD-10-CM | POA: Diagnosis present

## 2024-02-03 DIAGNOSIS — J301 Allergic rhinitis due to pollen: Secondary | ICD-10-CM

## 2024-02-03 DIAGNOSIS — F411 Generalized anxiety disorder: Secondary | ICD-10-CM | POA: Diagnosis present

## 2024-02-03 DIAGNOSIS — Z833 Family history of diabetes mellitus: Secondary | ICD-10-CM

## 2024-02-03 DIAGNOSIS — R197 Diarrhea, unspecified: Secondary | ICD-10-CM | POA: Diagnosis present

## 2024-02-03 DIAGNOSIS — J1000 Influenza due to other identified influenza virus with unspecified type of pneumonia: Secondary | ICD-10-CM | POA: Diagnosis present

## 2024-02-03 DIAGNOSIS — J159 Unspecified bacterial pneumonia: Secondary | ICD-10-CM | POA: Diagnosis present

## 2024-02-03 DIAGNOSIS — R55 Syncope and collapse: Secondary | ICD-10-CM | POA: Diagnosis not present

## 2024-02-03 DIAGNOSIS — I951 Orthostatic hypotension: Secondary | ICD-10-CM | POA: Diagnosis present

## 2024-02-03 DIAGNOSIS — Z1611 Resistance to penicillins: Secondary | ICD-10-CM | POA: Diagnosis present

## 2024-02-03 DIAGNOSIS — N1832 Chronic kidney disease, stage 3b: Secondary | ICD-10-CM | POA: Diagnosis present

## 2024-02-03 DIAGNOSIS — F319 Bipolar disorder, unspecified: Secondary | ICD-10-CM | POA: Diagnosis present

## 2024-02-03 DIAGNOSIS — N179 Acute kidney failure, unspecified: Secondary | ICD-10-CM | POA: Diagnosis present

## 2024-02-03 DIAGNOSIS — B9620 Unspecified Escherichia coli [E. coli] as the cause of diseases classified elsewhere: Secondary | ICD-10-CM | POA: Diagnosis present

## 2024-02-03 DIAGNOSIS — Z886 Allergy status to analgesic agent status: Secondary | ICD-10-CM

## 2024-02-03 DIAGNOSIS — Z6834 Body mass index (BMI) 34.0-34.9, adult: Secondary | ICD-10-CM

## 2024-02-03 DIAGNOSIS — E1122 Type 2 diabetes mellitus with diabetic chronic kidney disease: Secondary | ICD-10-CM | POA: Diagnosis present

## 2024-02-03 DIAGNOSIS — D638 Anemia in other chronic diseases classified elsewhere: Secondary | ICD-10-CM | POA: Diagnosis present

## 2024-02-03 DIAGNOSIS — Z7951 Long term (current) use of inhaled steroids: Secondary | ICD-10-CM | POA: Diagnosis not present

## 2024-02-03 DIAGNOSIS — Z87891 Personal history of nicotine dependence: Secondary | ICD-10-CM

## 2024-02-03 DIAGNOSIS — M549 Dorsalgia, unspecified: Secondary | ICD-10-CM | POA: Diagnosis not present

## 2024-02-03 DIAGNOSIS — Z91018 Allergy to other foods: Secondary | ICD-10-CM

## 2024-02-03 DIAGNOSIS — R531 Weakness: Principal | ICD-10-CM

## 2024-02-03 DIAGNOSIS — Z79899 Other long term (current) drug therapy: Secondary | ICD-10-CM

## 2024-02-03 DIAGNOSIS — Z7982 Long term (current) use of aspirin: Secondary | ICD-10-CM

## 2024-02-03 DIAGNOSIS — N3000 Acute cystitis without hematuria: Secondary | ICD-10-CM | POA: Diagnosis present

## 2024-02-03 DIAGNOSIS — J309 Allergic rhinitis, unspecified: Secondary | ICD-10-CM | POA: Diagnosis present

## 2024-02-03 DIAGNOSIS — Z888 Allergy status to other drugs, medicaments and biological substances status: Secondary | ICD-10-CM

## 2024-02-03 DIAGNOSIS — Z885 Allergy status to narcotic agent status: Secondary | ICD-10-CM

## 2024-02-03 DIAGNOSIS — Z825 Family history of asthma and other chronic lower respiratory diseases: Secondary | ICD-10-CM

## 2024-02-03 LAB — URINALYSIS, ROUTINE W REFLEX MICROSCOPIC
Bilirubin Urine: NEGATIVE
Glucose, UA: 50 mg/dL — AB
Hgb urine dipstick: NEGATIVE
Ketones, ur: NEGATIVE mg/dL
Nitrite: NEGATIVE
Protein, ur: >=300 mg/dL — AB
Specific Gravity, Urine: 1.022 (ref 1.005–1.030)
pH: 5 (ref 5.0–8.0)

## 2024-02-03 LAB — RESP PANEL BY RT-PCR (RSV, FLU A&B, COVID)  RVPGX2
Influenza A by PCR: POSITIVE — AB
Influenza B by PCR: NEGATIVE
Resp Syncytial Virus by PCR: NEGATIVE
SARS Coronavirus 2 by RT PCR: NEGATIVE

## 2024-02-03 LAB — APTT: aPTT: 44 s — ABNORMAL HIGH (ref 24–36)

## 2024-02-03 LAB — BASIC METABOLIC PANEL
Anion gap: 10 (ref 5–15)
BUN: 19 mg/dL (ref 6–20)
CO2: 22 mmol/L (ref 22–32)
Calcium: 8.3 mg/dL — ABNORMAL LOW (ref 8.9–10.3)
Chloride: 109 mmol/L (ref 98–111)
Creatinine, Ser: 2.1 mg/dL — ABNORMAL HIGH (ref 0.44–1.00)
GFR, Estimated: 29 mL/min — ABNORMAL LOW (ref >60)
Glucose, Bld: 271 mg/dL — ABNORMAL HIGH (ref 70–99)
Potassium: 4.4 mmol/L (ref 3.5–5.1)
Sodium: 141 mmol/L (ref 135–145)

## 2024-02-03 LAB — PROTIME-INR
INR: 1.1 (ref 0.8–1.2)
Prothrombin Time: 14.3 s (ref 11.4–15.2)

## 2024-02-03 LAB — CBG MONITORING, ED
Glucose-Capillary: 226 mg/dL — ABNORMAL HIGH (ref 70–99)
Glucose-Capillary: 127 mg/dL — ABNORMAL HIGH (ref 70–99)

## 2024-02-03 LAB — TROPONIN I (HIGH SENSITIVITY)
Troponin I (High Sensitivity): 9 ng/L (ref <18)
Troponin I (High Sensitivity): 6 ng/L (ref <18)

## 2024-02-03 LAB — HCG, SERUM, QUALITATIVE: Preg, Serum: NEGATIVE

## 2024-02-03 LAB — CBC
HCT: 34.2 % — ABNORMAL LOW (ref 36.0–46.0)
Hemoglobin: 10.7 g/dL — ABNORMAL LOW (ref 12.0–15.0)
MCH: 27.3 pg (ref 26.0–34.0)
MCHC: 31.3 g/dL (ref 30.0–36.0)
MCV: 87.2 fL (ref 80.0–100.0)
Platelets: 383 10*3/uL (ref 150–400)
RBC: 3.92 MIL/uL (ref 3.87–5.11)
RDW: 12.9 % (ref 11.5–15.5)
WBC: 9.9 10*3/uL (ref 4.0–10.5)
nRBC: 0 % (ref 0.0–0.2)

## 2024-02-03 LAB — MAGNESIUM: Magnesium: 1.5 mg/dL — ABNORMAL LOW (ref 1.7–2.4)

## 2024-02-03 LAB — HEMOGLOBIN A1C
Hgb A1c MFr Bld: 9.7 % — ABNORMAL HIGH (ref 4.8–5.6)
Mean Plasma Glucose: 231.69 mg/dL

## 2024-02-03 LAB — CK: Total CK: 37 U/L — ABNORMAL LOW (ref 38–234)

## 2024-02-03 MED ORDER — ONDANSETRON HCL 4 MG/2ML IJ SOLN
4.0000 mg | Freq: Four times a day (QID) | INTRAMUSCULAR | Status: DC | PRN
  Administered 2024-02-04 – 2024-02-05 (×4): 4 mg via INTRAVENOUS
  Filled 2024-02-03 (×4): qty 2

## 2024-02-03 MED ORDER — ALPRAZOLAM 0.5 MG PO TABS
1.0000 mg | ORAL_TABLET | Freq: Three times a day (TID) | ORAL | Status: DC | PRN
  Administered 2024-02-04 – 2024-02-07 (×5): 1 mg via ORAL
  Filled 2024-02-03 (×5): qty 2

## 2024-02-03 MED ORDER — SODIUM CHLORIDE 0.9 % IV SOLN
1.0000 g | INTRAVENOUS | Status: DC
  Administered 2024-02-04 – 2024-02-06 (×3): 1 g via INTRAVENOUS
  Filled 2024-02-03 (×3): qty 10

## 2024-02-03 MED ORDER — MELATONIN 3 MG PO TABS
3.0000 mg | ORAL_TABLET | Freq: Every evening | ORAL | Status: DC | PRN
  Administered 2024-02-05 – 2024-02-06 (×2): 3 mg via ORAL
  Filled 2024-02-03 (×2): qty 1

## 2024-02-03 MED ORDER — INSULIN ASPART 100 UNIT/ML IJ SOLN
0.0000 [IU] | Freq: Every day | INTRAMUSCULAR | Status: DC
  Administered 2024-02-05: 2 [IU] via SUBCUTANEOUS

## 2024-02-03 MED ORDER — ACETAMINOPHEN 325 MG PO TABS
650.0000 mg | ORAL_TABLET | Freq: Four times a day (QID) | ORAL | Status: DC | PRN
  Administered 2024-02-03: 650 mg via ORAL
  Filled 2024-02-03 (×2): qty 2

## 2024-02-03 MED ORDER — MAGNESIUM SULFATE 2 GM/50ML IV SOLN
2.0000 g | Freq: Once | INTRAVENOUS | Status: AC
  Administered 2024-02-03: 2 g via INTRAVENOUS
  Filled 2024-02-03: qty 50

## 2024-02-03 MED ORDER — ALBUTEROL SULFATE (2.5 MG/3ML) 0.083% IN NEBU
2.5000 mg | INHALATION_SOLUTION | RESPIRATORY_TRACT | Status: DC | PRN
  Administered 2024-02-07: 2.5 mg via RESPIRATORY_TRACT
  Filled 2024-02-03: qty 3

## 2024-02-03 MED ORDER — ONDANSETRON 4 MG PO TBDP
4.0000 mg | ORAL_TABLET | Freq: Once | ORAL | Status: AC
  Administered 2024-02-03: 4 mg via ORAL
  Filled 2024-02-03: qty 1

## 2024-02-03 MED ORDER — GUAIFENESIN ER 600 MG PO TB12
600.0000 mg | ORAL_TABLET | Freq: Two times a day (BID) | ORAL | Status: DC
  Administered 2024-02-04 – 2024-02-06 (×5): 600 mg via ORAL
  Filled 2024-02-03 (×5): qty 1

## 2024-02-03 MED ORDER — SODIUM CHLORIDE 0.9 % IV SOLN
1.0000 g | Freq: Once | INTRAVENOUS | Status: AC
  Administered 2024-02-03: 1 g via INTRAVENOUS
  Filled 2024-02-03: qty 10

## 2024-02-03 MED ORDER — INSULIN ASPART 100 UNIT/ML IJ SOLN
0.0000 [IU] | Freq: Three times a day (TID) | INTRAMUSCULAR | Status: DC
  Administered 2024-02-04: 3 [IU] via SUBCUTANEOUS
  Administered 2024-02-04 – 2024-02-05 (×2): 1 [IU] via SUBCUTANEOUS
  Administered 2024-02-05: 2 [IU] via SUBCUTANEOUS
  Administered 2024-02-06: 3 [IU] via SUBCUTANEOUS
  Administered 2024-02-07: 5 [IU] via SUBCUTANEOUS
  Administered 2024-02-07: 1 [IU] via SUBCUTANEOUS

## 2024-02-03 MED ORDER — OXYCODONE HCL 5 MG PO TABS
5.0000 mg | ORAL_TABLET | ORAL | Status: DC | PRN
  Administered 2024-02-03 – 2024-02-07 (×10): 5 mg via ORAL
  Filled 2024-02-03 (×10): qty 1

## 2024-02-03 MED ORDER — FLUTICASONE PROPIONATE 50 MCG/ACT NA SUSP
1.0000 | Freq: Every day | NASAL | Status: DC
  Administered 2024-02-03 – 2024-02-06 (×4): 1 via NASAL
  Filled 2024-02-03: qty 16

## 2024-02-03 MED ORDER — ACETAMINOPHEN 650 MG RE SUPP: 650.0000 mg | Freq: Four times a day (QID) | RECTAL | Status: DC | PRN

## 2024-02-03 MED ORDER — LACTATED RINGERS IV SOLN: INTRAVENOUS | Status: AC

## 2024-02-03 MED ORDER — LACTATED RINGERS IV BOLUS
2000.0000 mL | Freq: Once | INTRAVENOUS | Status: AC
  Administered 2024-02-03: 2000 mL via INTRAVENOUS

## 2024-02-03 MED ORDER — ENSURE ENLIVE PO LIQD
237.0000 mL | Freq: Two times a day (BID) | ORAL | Status: DC
  Administered 2024-02-04 – 2024-02-05 (×3): 237 mL via ORAL

## 2024-02-03 MED ORDER — BENZONATATE 100 MG PO CAPS: 200.0000 mg | ORAL_CAPSULE | Freq: Three times a day (TID) | ORAL | Status: DC | PRN

## 2024-02-03 MED ORDER — FLUOXETINE HCL 20 MG PO CAPS
40.0000 mg | ORAL_CAPSULE | Freq: Every day | ORAL | Status: DC
  Administered 2024-02-04 – 2024-02-07 (×4): 40 mg via ORAL
  Filled 2024-02-03 (×4): qty 2

## 2024-02-03 NOTE — Progress Notes (Signed)
 Bed alarm initiated, call bell within reach.

## 2024-02-03 NOTE — ED Provider Notes (Signed)
 [facility] AT [facility] Provider Note   CSN: [id number] Arrival date & time: 02/03/24  1047     History  Chief Complaint  Patient presents with   Near Syncope   Emesis   Nasal Congestion    Virginia [name] is a 48 y.o. female past medical history significant for hypertension diabetes presents today for near syncopal episode.  Patient endorses congestion, headache, body aches, nausea, vomiting, diarrhea, and coughing x 2 weeks.  Patient also had dizziness this morning which caused her to almost pass out.   Near Syncope  Emesis Associated symptoms: cough and diarrhea        Home Medications Prior to Admission medications   Medication Sig Start Date End Date Taking? Authorizing Provider  acetaminophen (TYLENOL) 500 MG tablet Take 1,000 mg by mouth daily as needed for mild pain or headache.    [provider]  albuterol (VENTOLIN HFA) 108 (90 Base) MCG/ACT inhaler Inhale 2 puffs every 4 (four) hours as needed into the lungs for wheezing or shortness of breath.    [provider]  ALPRAZolam Prudy Feeler) 1 MG tablet Take 1 mg by mouth 3 (three) times daily as needed for anxiety. 02/26/20   [provider]  amLODipine (NORVASC) 10 MG tablet Take 1 tablet (10 mg total) by mouth daily. 05/26/23   Lurene Shadow, MD  amphetamine-dextroamphetamine (ADDERALL) 30 MG tablet Take 1 tablet by mouth 2 (two) times daily. 05/06/23     aspirin EC 81 MG tablet Take 1 tablet (81 mg total) by mouth daily. Swallow whole. 05/26/23   Lurene Shadow, MD  Continuous Glucose Sensor (FREESTYLE LIBRE 3 SENSOR) MISC Place 1 sensor on the skin every 14 days. Use to check glucose continuously 05/25/23   Lurene Shadow, MD  FLUoxetine (PROZAC) 40 MG capsule Take 40 mg by mouth daily. 12/21/19   [provider]  fluticasone (FLONASE) 50 MCG/ACT nasal spray Place 1 spray into both nostrils at bedtime. 05/25/23 06/24/23  Lurene Shadow, MD  insulin  detemir (LEVEMIR) 100 UNIT/ML FlexPen Inject 12 Units into the skin daily. 04/19/23 06/18/23  Mannie Stabile, PA-C  loratadine (CLARITIN) 10 MG tablet Take 1 tablet (10 mg total) by mouth daily. 03/06/23 05/24/23  Lanae Boast, MD  mometasone-formoterol (DULERA) 100-5 MCG/ACT AERO Inhale 2 puffs into the lungs at bedtime. 05/25/23 06/24/23  Lurene Shadow, MD  montelukast (SINGULAIR) 10 MG tablet Take 1 tablet (10 mg total) by mouth at bedtime. 03/05/23 05/24/23  Lanae Boast, MD  ondansetron (ZOFRAN) 4 MG tablet Take 1 tablet (4 mg total) by mouth every 6 (six) hours. 07/31/23   Glyn Ade, MD  oxyCODONE (ROXICODONE) 5 MG immediate release tablet Take 1 tablet (5 mg total) by mouth every 6 (six) hours as needed for severe pain. 07/31/23   Glyn Ade, MD  rosuvastatin (CRESTOR) 20 MG tablet Take 1 tablet (20 mg total) by mouth daily. 05/26/23   Lurene Shadow, MD      Allergies    Ace inhibitors, Banana, Lamictal [lamotrigine], Morphine, Nsaids, and Topiramate    Review of Systems   Review of Systems  HENT:  Positive for congestion.   Respiratory:  Positive for cough.   Cardiovascular:  Positive for near-syncope.  Gastrointestinal:  Positive for diarrhea, nausea and vomiting.    Physical Exam Updated Vital Signs BP (!) 172/96   Pulse (!) 111   Temp 98 F (36.7 C)   Resp 16   Ht 5\' 1"  (1.549 m)  Wt 75.8 kg   LMP 01/04/2024 (Approximate)   SpO2 100%   BMI 31.55 kg/m  Physical Exam Vitals and nursing note reviewed.  Constitutional:      General: She is not in acute distress.    Appearance: She is well-developed. She is ill-appearing. She is not toxic-appearing or diaphoretic.  HENT:     Head: Normocephalic and atraumatic.     Right Ear: External ear normal.     Left Ear: External ear normal.     Nose: Congestion present.     Mouth/Throat:     Mouth: Mucous membranes are moist.     Pharynx: Oropharynx is clear.  Eyes:     Extraocular Movements: Extraocular movements  intact.     Conjunctiva/sclera: Conjunctivae normal.  Cardiovascular:     Rate and Rhythm: Regular rhythm. Tachycardia present.     Pulses: Normal pulses.     Heart sounds: Normal heart sounds. No murmur heard. Pulmonary:     Effort: Pulmonary effort is normal. No respiratory distress.     Breath sounds: Normal breath sounds.  Abdominal:     Palpations: Abdomen is soft.     Tenderness: There is no abdominal tenderness.  Musculoskeletal:        General: No swelling.     Cervical back: Normal range of motion and neck supple.  Skin:    General: Skin is warm and dry.     Capillary Refill: Capillary refill takes less than 2 seconds.  Neurological:     General: No focal deficit present.     Mental Status: She is alert.     Motor: No weakness.  Psychiatric:        Mood and Affect: Mood normal.     ED Results / Procedures / Treatments   Labs (all labs ordered are listed, but only abnormal results are displayed) Labs Reviewed  CBC - Abnormal; Notable for the following components:      Result Value   Hemoglobin 10.7 (*)    HCT 34.2 (*)    All other components within normal limits  RESP PANEL BY RT-PCR (RSV, FLU A&B, COVID)  [id number]  BASIC METABOLIC PANEL  URINALYSIS, ROUTINE W REFLEX MICROSCOPIC  HCG, SERUM, QUALITATIVE  CBG MONITORING, ED    EKG None  Radiology No results found.  Procedures Procedures    Medications Ordered in ED Medications - No data to display  ED Course/ Medical Decision Making/ A&P                                 Medical Decision Making Amount and/or Complexity of Data Reviewed Labs: ordered. Radiology: ordered.   This patient presents to the ED with chief complaint(s) of near syncopal episode and flulike symptoms with pertinent past medical history of hypertension and diabetes which further complicates the presenting complaint. The complaint involves an extensive differential diagnosis and also carries with it a high risk of complications  and morbidity.    The differential diagnosis includes flu, COVID, RSV, pneumonia, URI, ACS, STEMI, NSTEMI, electrolyte abnormality, orthostatic hypotension  Additional history obtained: Additional history obtained from family Records reviewed previous admission documents  ED Course and Reassessment: Patient given Zofran for nausea  Independent labs interpretation:  The following labs were independently interpreted:  CBC: Anemia which is chronic per historical values BMP: Elevated creatinine which is chronic per historical values Troponin: EKG: Sinus tachycardia, Incomplete right bundle branch block, Abnormal QRS-T  angle, consider primary T wave abnormality Respiratory panel: Influenza A positive Serum hCG: Negative  Independent visualization of imaging: - I independently visualized the following imaging with scope of interpretation limited to determining acute life threatening conditions related to emergency care: Chest x-ray, which revealed   Patient signed out to Marita Kansas, PA-C at shift change pending labs which will determine dispo.  Please refer to their note for complete results and findings.        Final Clinical Impression(s) / ED Diagnoses Final diagnoses:  None    Rx / DC Orders ED Discharge Orders     None         Gretta Began 02/03/24 1530    Benjiman Core, MD 02/04/24 [phone]

## 2024-02-03 NOTE — ED Triage Notes (Signed)
 Pt with congestion, headache, body aches, n/v/d, coughing x 2 weeks. Also states dizziness this morning which caused her to pass out.

## 2024-02-03 NOTE — Progress Notes (Signed)
 Patient wish to have all her old and new medication list sent to her new PCP. Pt does not remember the name of her PCP as of the moment but will inform staffing when she does finds out the information.

## 2024-02-03 NOTE — Progress Notes (Signed)
 Attempted to reach sister Jasmine per pt request and no one pick up the call. [phone]

## 2024-02-03 NOTE — ED Provider Notes (Signed)
 Signout received on this 48 year old female.  See previous note for full details.  Essentially she is coming in with nausea, vomiting, diarrhea, progressive worsening weakness, syncopal episode today.  Physical Exam  BP (!) 153/104   Pulse (!) 107   Temp 98.4 F (36.9 C)   Resp 18   Ht 5\' 1"  (1.549 m)   Wt 75.8 kg   LMP 01/04/2024 (Approximate)   SpO2 100%   BMI 31.55 kg/m     Procedures  Procedures  ED Course / MDM   Clinical Course as of 02/03/24 1954  Fri Feb 03, 2024  1945 Creatinine(!): 2.10 [AA]    Clinical Course User Index [AA] Marita Kansas, PA-C   Medical Decision Making Amount and/or Complexity of Data Reviewed Labs: ordered. Decision-making details documented in ED Course. Radiology: ordered.  Risk Prescription drug management. Decision regarding hospitalization.   IV fluids given.  Patient is flu positive.  Potentially has UTI.  Unsure if her creatinine is acutely elevated or if it is chronic.  Has had previous similar elevations however also has had baseline normal creatinine last year. Patient is not feeling any improvement on reevaluation.  Feels she may benefit from admission.  Discussed with hospitalist.  They will evaluate patient for admission.       Marita Kansas, PA-C 02/03/24 Brooke Pace    Linwood Dibbles, MD 02/04/24 [phone]

## 2024-02-03 NOTE — H&P (Addendum)
 History and Physical      Virginia [name] ZOX:[id number] DOB: 27-Oct-1976 DOA: 02/03/2024; DOS: 02/03/2024  PCP: Pcp, No (will further assess) Patient coming from: home   I have personally briefly reviewed patient's old medical records in [facility]  Chief Complaint: Generalized weakness  HPI: Virginia [name] is a 48 y.o. female with medical history significant for type 2 diabetes mellitus, generalized anxiety disorder, allergic rhinitis, CKD 3B associated baseline creatinine range 1.3-1.7, anemia of chronic disease associated baseline hemoglobin range of 10-12, who is admitted to [facility] on 02/03/2024 with generalized weakness after presenting from home to [facility] ED complaining of generalized weakness.   The patient reports 3 to 4 days of generalized weakness in the absence of any acute focal weakness.  As the generalized weakness has progressed in severity over the last 3 to 4 days, she notes some new difficulty with ambulation as a consequence of this.  Over the last 3 to 4 days, she has also noted associated new onset cough, rhinitis, rhinorrhea, subjective fever, generalized myalgias, the absence of chills or full body rigors.  She is also noted intermittent nausea resulting in 3-4 episodes of nonbloody, nonbilious emesis over the last 3 to 4 days as well as 2-3 episodes of loose watery stool in the absence of any melena or hematochezia.  Not associate with any abdominal discomfort.  However, she does note urinary urgency over the last 2 to 3 days, in the absence of overt dysuria or gross hematuria.  Denies associated neck stiffness, rash.  Additionally, in the setting of intermittent nausea/vomiting over the last few days, she conveys diminished oral intake over that timeframe.  Earlier today, the patient reports a single episode of loss of consciousness that occurred while attempting to rise from a seated to standing position. As she rose from a seated to standing  position, she conveys development of dizziness, lightheadedness, and the subjective sensation of impending loss of consciousness, before formally losing consciousness and falling to the floor.  She does not believe that she hit her head as a component of this process, and denies any ensuing headache or acute change in vision.  Not associate with any tongue biting or any loss of bowel/bladder function nor any observed tonic-clonic activity.  She denies any associated chest pain, nor any acute focal weakness or acute focal numbness, paresthesias, vertigo, facial droop, expressive aphasia, or dysphagia.     ED Course:  Vital signs in the ED were notable for the following: Afebrile; heart rates in the low 100s; systolic blood pressures in the 150s to 160s; respiratory rate 16-19, oxygen saturation 98 to 100% on room air.  Labs were notable for the following: CMP, which was notable for sodium 141, potassium 4.4, bicarbonate 20, anion gap 10, creatinine 2.1 compared to most recent prior value of 2.16 on 07/31/2023.  Magnesium level 1.5.  High sensitive troponin I initially was 9, repeat value currently pending.  Qualitative serum hCG was negative.  CBG 226.  CBC notable for white cell count 900, hemoglobin 10.7 associated Neuraceq/marker properties as well as nonelevated RDW, and relative to most recent hemoglobin value of 10.0.  Urinalysis was associate with a cloudy appearing specimen and notable for 21-50 white blood cells, small leukocyte esterase, many bacteria, 21-50 squamous epithelial cells, 50 glucose, negative ketones, greater than 300 protein, 6-10 RBCs and was positive for hyaline casts and was associate with specific gravity 1.022.  Influenza A PCR was positive, We will COVID, RSV  and influenza B PCR all negative.  Per my interpretation, EKG in ED demonstrated the following: Sinus tachycardia with incomplete right bundle branch block, heart rate 113, nonspecific T wave version in leads I, aVL, no  evidence of ST changes, including no evidence of ST elevation.  Imaging in the ED, per corresponding formal radiology read, was notable for the following: 2 view chest x-ray showed no evidence of acute cardiopulmonary process, including no evidence of infiltrate edema, effusion, or pneumothorax.  While in the ED, the following were administered: Zofran 4 mg ODT x 1 dose, Rocephin, lactated Ringer's x 2 L bolus, acetaminophen 650 mg p.o. x 1 dose.  Subsequently, the patient was admitted for further evaluation management of generalized weakness in the setting of influenza A infection, with presentation also notable for suspected acute kidney injury superimposed on CKD stage IIIb, dehydration, hypomagnesemia, suspicion for urinary tract infection as well as a single episode of syncope with prodrome.     Review of Systems: As per HPI otherwise 10 point review of systems negative.   Past Medical History:  Diagnosis Date   Anxiety    Arthritis    Bipolar affective (HCC)    Depression    Diabetes mellitus    Hypertension    Renal disorder     Past Surgical History:  Procedure Laterality Date   CESAREAN SECTION     CHOLECYSTECTOMY     GASTRIC BYPASS     KNEE SURGERY      Social History:  reports that she quit smoking about 10 years ago. Her smoking use included cigarettes. She has never used smokeless tobacco. She reports current alcohol use. She reports that she does not use drugs.   Allergies  Allergen Reactions   Ace Inhibitors Nausea And Vomiting and Cough   Banana Nausea And Vomiting   Lamictal [Lamotrigine] Nausea And Vomiting   Morphine Itching   Nsaids Other (See Comments)    Kidney failure   Topiramate Nausea And Vomiting    Family History  Problem Relation Age of Onset   Asthma Mother    Hypertension Mother    Hypertension Father    Diabetes Mellitus II Father     Family history reviewed and not pertinent    Prior to Admission medications   Medication Sig  Start Date End Date Taking? Authorizing Provider  acetaminophen (TYLENOL) 500 MG tablet Take 1,000 mg by mouth daily as needed for mild pain or headache.    [provider]  albuterol (VENTOLIN HFA) 108 (90 Base) MCG/ACT inhaler Inhale 2 puffs every 4 (four) hours as needed into the lungs for wheezing or shortness of breath.    [provider]  ALPRAZolam Prudy Feeler) 1 MG tablet Take 1 mg by mouth 3 (three) times daily as needed for anxiety. 02/26/20   [provider]  amLODipine (NORVASC) 10 MG tablet Take 1 tablet (10 mg total) by mouth daily. 05/26/23   Lurene Shadow, MD  amphetamine-dextroamphetamine (ADDERALL) 30 MG tablet Take 1 tablet by mouth 2 (two) times daily. 05/06/23     aspirin EC 81 MG tablet Take 1 tablet (81 mg total) by mouth daily. Swallow whole. 05/26/23   Lurene Shadow, MD  Continuous Glucose Sensor (FREESTYLE LIBRE 3 SENSOR) MISC Place 1 sensor on the skin every 14 days. Use to check glucose continuously 05/25/23   Lurene Shadow, MD  FLUoxetine (PROZAC) 40 MG capsule Take 40 mg by mouth daily. 12/21/19   [provider]  fluticasone (FLONASE) 50 MCG/ACT  nasal spray Place 1 spray into both nostrils at bedtime. 05/25/23 06/24/23  Lurene Shadow, MD  insulin detemir (LEVEMIR) 100 UNIT/ML FlexPen Inject 12 Units into the skin daily. 04/19/23 06/18/23  Mannie Stabile, PA-C  loratadine (CLARITIN) 10 MG tablet Take 1 tablet (10 mg total) by mouth daily. 03/06/23 05/24/23  Lanae Boast, MD  mometasone-formoterol (DULERA) 100-5 MCG/ACT AERO Inhale 2 puffs into the lungs at bedtime. 05/25/23 06/24/23  Lurene Shadow, MD  montelukast (SINGULAIR) 10 MG tablet Take 1 tablet (10 mg total) by mouth at bedtime. 03/05/23 05/24/23  Lanae Boast, MD  ondansetron (ZOFRAN) 4 MG tablet Take 1 tablet (4 mg total) by mouth every 6 (six) hours. 07/31/23   Glyn Ade, MD  oxyCODONE (ROXICODONE) 5 MG immediate release tablet Take 1 tablet (5 mg total) by mouth every 6 (six) hours as  needed for severe pain. 07/31/23   Glyn Ade, MD  rosuvastatin (CRESTOR) 20 MG tablet Take 1 tablet (20 mg total) by mouth daily. 05/26/23   Lurene Shadow, MD     Objective    Physical Exam: Vitals:   02/03/24 1104 02/03/24 1205 02/03/24 1531 02/03/24 1700  BP: (!) 172/96  (!) 161/84 (!) 153/104  Pulse: (!) 111  (!) 111 (!) 107  Resp: 16  19 18   Temp: 98 F (36.7 C)  98.4 F (36.9 C)   SpO2: 100%  98% 100%  Weight:  75.8 kg    Height:  5\' 1"  (1.549 m)      General: appears to be stated age; alert, oriented Skin: warm, dry, no rash Head:  AT/[place] Mouth:  Oral mucosa membranes appear dry, normal dentition Neck: supple; trachea midline Heart: Tachycardic, but regular; did not appreciate any M/R/G Lungs: CTAB, did not appreciate any wheezes, rales, or rhonchi Abdomen: + BS; soft, ND, NT Vascular: 2+ pedal pulses b/l; 2+ radial pulses b/l Extremities: no peripheral edema, no muscle wasting Neuro: strength and sensation intact in upper and lower extremities b/l    Labs on Admission: I have personally reviewed following labs and imaging studies  CBC: Recent Labs  Lab 02/03/24 1217  WBC 9.9  HGB 10.7*  HCT 34.2*  MCV 87.2  PLT 383   Basic Metabolic Panel: Recent Labs  Lab 02/03/24 1217  NA 141  K 4.4  CL 109  CO2 22  GLUCOSE 271*  BUN 19  CREATININE 2.10*  CALCIUM 8.3*   GFR: Estimated Creatinine Clearance: 31.2 mL/min (A) (by C-G formula based on SCr of 2.1 mg/dL (H)). Liver Function Tests: No results for input(s): "AST", "ALT", "ALKPHOS", "BILITOT", "PROT", "ALBUMIN" in the last 168 hours. No results for input(s): "LIPASE", "AMYLASE" in the last 168 hours. No results for input(s): "AMMONIA" in the last 168 hours. Coagulation Profile: No results for input(s): "INR", "PROTIME" in the last 168 hours. Cardiac Enzymes: No results for input(s): "CKTOTAL", "CKMB", "CKMBINDEX", "TROPONINI" in the last 168 hours. BNP (last 3 results) No results for  input(s): "PROBNP" in the last 8760 hours. HbA1C: No results for input(s): "HGBA1C" in the last 72 hours. CBG: Recent Labs  Lab 02/03/24 1414  GLUCAP 226*   Lipid Profile: No results for input(s): "CHOL", "HDL", "LDLCALC", "TRIG", "CHOLHDL", "LDLDIRECT" in the last 72 hours. Thyroid Function Tests: No results for input(s): "TSH", "T4TOTAL", "FREET4", "T3FREE", "THYROIDAB" in the last 72 hours. Anemia Panel: No results for input(s): "VITAMINB12", "FOLATE", "FERRITIN", "TIBC", "IRON", "RETICCTPCT" in the last 72 hours. Urine analysis:    Component Value Date/Time   COLORURINE AMBER (  A) 02/03/2024 1735   APPEARANCEUR CLOUDY (A) 02/03/2024 1735   APPEARANCEUR Hazy 07/09/2012 2031   LABSPEC 1.022 02/03/2024 1735   LABSPEC 1.016 07/09/2012 2031   PHURINE 5.0 02/03/2024 1735   GLUCOSEU 50 (A) 02/03/2024 1735   GLUCOSEU 150 mg/dL 16/09/9603 5409   HGBUR NEGATIVE 02/03/2024 1735   BILIRUBINUR NEGATIVE 02/03/2024 1735   BILIRUBINUR Negative 07/09/2012 2031   KETONESUR NEGATIVE 02/03/2024 1735   PROTEINUR >=300 (A) 02/03/2024 1735   UROBILINOGEN 1.0 10/25/2012 1509   NITRITE NEGATIVE 02/03/2024 1735   LEUKOCYTESUR SMALL (A) 02/03/2024 1735   LEUKOCYTESUR Negative 07/09/2012 2031    Radiological Exams on Admission: DG Chest 2 View Result Date: 02/03/2024 CLINICAL DATA:  Cough and congestion.  Headache. EXAM: CHEST - 2 VIEW COMPARISON:  05/22/2023. FINDINGS: Bilateral lung fields are clear. Bilateral costophrenic angles are clear. Normal cardio-mediastinal silhouette. No acute osseous abnormalities. The soft tissues are within normal limits. IMPRESSION: No active cardiopulmonary disease. Electronically Signed   By: Jules Schick M.D.   On: 02/03/2024 15:43      Assessment/Plan   Principal Problem:   Generalized weakness Active Problems:   DM2 (diabetes mellitus, type 2) (HCC)   Dehydration   Hypomagnesemia   Acute renal failure superimposed on stage 3b chronic kidney disease  (HCC)   Allergic rhinitis   Influenza A   Syncope   Acute cystitis   GAD (generalized anxiety disorder)   Anemia of chronic disease     #) Generalized weakness: 3 to 4-day duration of generalized weakness, in the absence of any evidence of acute focal neurologic deficits, including no evidence of acute focal weakness to suggest acute CVA.  Suspect multifactorial contributions, including contribution from physiologic stress stemming from presenting influenza A infection as well as resultant dehydration, potential and urinary tract infection.  No e/o additional infectious process at this time, including chest x-ray that showed no evidence of acute cardiopulmonary process, including no evidence of infiltrate to suggest pneumonia.   Will further eval for any additional contributions from endocrine/metabolic sources, as detailed below.   Plan: work-up and management of presenting influenza A infection as well as suspected urinary tract infection, as described below. PT/OT consults ordered for the AM. Fall precautions. CMP/CBC in the AM. Check TSH. Check CPK level, B12..                   #) Influenza A infection: Diagnosis on the basis of 3 to 4 days of new onset cough, rhinitis, rhinorrhea, subjective fever, generalized myalgias, with chest x-ray showing no evidence of acute cardiopulmonary process.  Does not appear to be in any acute respiratory distress, is maintaining oxygen saturations in the high 90s to 100% on room air.  Given the duration of the patient's symptoms, she presents outside of the window for initiation of Tamiflu.  Rather, we will pursue supportive measures, as further outlined below.  Of note, in the absence of objective fever and in the absence of leukocytosis, SIRS criteria for sepsis are not currently met.  Plan: Incentive spirometry, flutter valve.  Scheduled guaifenesin, prn Tessalon Perles.  Prn albuterol nebulizer.  Prn acetaminophen for fever.  CMP, CBC in  the morning.                 #) Acute Kidney Injury superimposed on CKD stage IIIb: Patient with history of CKD, which appears to be 3B and staging, with baseline creatinine of 1.3-1.7.  Over the last 6 months however there have been limited  creatinine data points available, noting that most recent creatinine data point was 2.16 on 07/31/2023.  Unclear if this August 2024 creatinine data point represented acute kidney injury superimposed on CKD 3B if it represented gradual worsening patient's to a new baseline.  For now, will operate under the presumption that her presenting creatinine of 2.1 represents an acute kidney injury superimposed on a 3B, with suspected contributions from prerenal etiology in the setting of dehydration given recent increase in GI losses concomitant with decline in oral intake.   Her presenting urinalysis with microscopy was notable for greater than 300, 6-10 RBCs, and was positive for hyaline cast, consistent with a picture of dehydration.  Plan: monitor strict I's & O's and daily weights. Attempt to avoid nephrotoxic agents. Refrain from NSAIDs. Repeat CMP in the morning. Check serum magnesium level. Add-on random urine sodium and random urine creatinine.  add-on random urine protein/cr ratio.  Lactated Ringer's at 125 cc/h x 12 hours.  Add on CPK level.                       #) Dehydration: Clinical suspicion for such, including the appearance of dry oral mucous membranes as well as laboratory findings notable for  UA demonstrating elevated specific gravity, and presence of hyaline casts.  Appears to be in the setting of   recent increase in GI losses concomitant with decline in oral intake.  No e/o associated hypotension.  Of note, she has received 2 L of LR in the ED this evening.   Plan: Monitor strict I's and O's.  Daily weights.  CMP in the morning. IVF's in form of lactated Ringer's at 125 cc/h x 12 hours.  Orthostatic vital signs.Marland Kitchen                   #) Syncope: 1 episode of loss of consciousness that occurred while rising from a seated to standing position and associated with dizziness, lightheadedness and subjective sensation of impending loss of consciousness consistent with prodrome, and suggestive of underlying orthostatic hypotension in the setting of dehydration stemming from her recent increase in GI losses concomitant with decline in oral intake.  ACS appears to be less likely in the absence of any associated chest pain, while first troponin is nonelevated and EKG shows no evidence of acute ischemic changes, including no evidence of STEMI.  Clinically, presentation appears less suggestive of underlying seizures, and no evidence of acute focal neurologic deficits to suggest acute stroke.  Clinically, acute pulmonary embolism also appears to be less likely at this time.  Plan: Will check orthostatic vital signs, but with the caveat that the patient is already received 2 L of lactated Ringer's in the ED.  Full Russians ordered.  Trend troponin.  Additional IV fluids, as outlined above.  Repeat Pete CMP, CBC in the morning.  Monitor on telemetry.                #) Acute cystitis: There is suspicion for potential acute cystitis given patient's report of recent development of  urinary urgency, with increased risk in the setting of recent loose stool, with presenting urinalysis associated with cloudy appearance, significant pyuria, the presence of leukocyte esterase.  However, with a concomitant presence of 21-50 squamous epithelial cells, may also be suggestive of a contaminated specimen.  Will add on urine culture, and continue, for now, the Rocephin that was initiated in the ED this evening will closely following for resolving considering urine culture.  As noted above, in the absence of objective fever or leukocytosis, surgical care for sepsis are not currently met.  Plan: Add on urine culture.   Continue Rocephin for now, as above.  CBC in the morning.                 #) Hypomagnesemia: presenting serum mag level noted to be 1.5, with contribution from recent increase in GI losses.    Plan: magnesium sulfate 2 g IV over 2 hours. Monitor on tele. Repeat serum mag level in the AM.                     #) Type 2 Diabetes Mellitus: documented history of such.  Appears to now be managed via lifestyle modifications, given recent discontinuation of outpatient Levemir 12 units SQ daily.  Not currently on any insulin or oral hypoglycemic agents at home.  Most recent hemoglobin A1c was noted to be 11.0% in June 2024.  Presenting blood sugar was 226, without any evidence of anion gap metabolic acidosis.  The patient conveys that she is amenable to meeting with the diabetic educator.   Plan: accuchecks QAC and HS with low dose SSI.  Add on hemoglobin A1c level.  I have placed order for consult of the diabetic educator for assistance with patient education as well as improving glycemic management.                  #) Generalized anxiety disorder: documented h/o such. On scheduled Prozac as well as as needed Xanax as outpatient.    Plan: Continue outpatient Prozac and as needed Xanax.                  #) Allergic Rhinitis: documented h/o such, on scheduled intranasal Flonase as outpatient.    Plan: cont home Flonase.                   #) Anemia of chronic disease: Documented history of such, a/w with baseline hgb range 10-12, with presenting hgb consistent with this range, in the absence of any overt evidence of active bleed.     Plan: Repeat CBC in the morning.  Check PTT, INR.        DVT prophylaxis: SCD's   Code Status: Full code Family Communication: none Disposition Plan: Per Rounding Team Consults called: none;  Admission status: inpatient     I SPENT GREATER THAN 75  MINUTES IN CLINICAL CARE  TIME/MEDICAL DECISION-MAKING IN COMPLETING THIS ADMISSION.      Chaney Born Karee Christopherson DO [facility]  From 7PM - 7AM   02/03/2024, 7:55 PM

## 2024-02-04 DIAGNOSIS — N3000 Acute cystitis without hematuria: Secondary | ICD-10-CM | POA: Diagnosis present

## 2024-02-04 DIAGNOSIS — J301 Allergic rhinitis due to pollen: Secondary | ICD-10-CM | POA: Diagnosis not present

## 2024-02-04 DIAGNOSIS — R531 Weakness: Secondary | ICD-10-CM | POA: Diagnosis not present

## 2024-02-04 DIAGNOSIS — D638 Anemia in other chronic diseases classified elsewhere: Secondary | ICD-10-CM | POA: Diagnosis present

## 2024-02-04 DIAGNOSIS — N179 Acute kidney failure, unspecified: Secondary | ICD-10-CM | POA: Diagnosis not present

## 2024-02-04 DIAGNOSIS — F411 Generalized anxiety disorder: Secondary | ICD-10-CM | POA: Diagnosis present

## 2024-02-04 DIAGNOSIS — J101 Influenza due to other identified influenza virus with other respiratory manifestations: Secondary | ICD-10-CM | POA: Diagnosis present

## 2024-02-04 DIAGNOSIS — R55 Syncope and collapse: Secondary | ICD-10-CM | POA: Diagnosis present

## 2024-02-04 LAB — CBC WITH DIFFERENTIAL/PLATELET
Abs Immature Granulocytes: 0.07 10*3/uL (ref 0.00–0.07)
Basophils Absolute: 0 10*3/uL (ref 0.0–0.1)
Basophils Relative: 0 %
Eosinophils Absolute: 0.1 10*3/uL (ref 0.0–0.5)
Eosinophils Relative: 1 %
HCT: 31.1 % — ABNORMAL LOW (ref 36.0–46.0)
Hemoglobin: 10 g/dL — ABNORMAL LOW (ref 12.0–15.0)
Immature Granulocytes: 1 %
Lymphocytes Relative: 41 %
Lymphs Abs: 3.7 10*3/uL (ref 0.7–4.0)
MCH: 27.5 pg (ref 26.0–34.0)
MCHC: 32.2 g/dL (ref 30.0–36.0)
MCV: 85.4 fL (ref 80.0–100.0)
Monocytes Absolute: 1 10*3/uL (ref 0.1–1.0)
Monocytes Relative: 11 %
Neutro Abs: 4.1 10*3/uL (ref 1.7–7.7)
Neutrophils Relative %: 46 %
Platelets: 366 10*3/uL (ref 150–400)
RBC: 3.64 MIL/uL — ABNORMAL LOW (ref 3.87–5.11)
RDW: 12.7 % (ref 11.5–15.5)
WBC: 9 10*3/uL (ref 4.0–10.5)
nRBC: 0 % (ref 0.0–0.2)

## 2024-02-04 LAB — COMPREHENSIVE METABOLIC PANEL
ALT: 29 U/L (ref 0–44)
AST: 13 U/L — ABNORMAL LOW (ref 15–41)
Albumin: 2 g/dL — ABNORMAL LOW (ref 3.5–5.0)
Alkaline Phosphatase: 129 U/L — ABNORMAL HIGH (ref 38–126)
Anion gap: 9 (ref 5–15)
BUN: 14 mg/dL (ref 6–20)
CO2: 21 mmol/L — ABNORMAL LOW (ref 22–32)
Calcium: 7.9 mg/dL — ABNORMAL LOW (ref 8.9–10.3)
Chloride: 110 mmol/L (ref 98–111)
Creatinine, Ser: 1.62 mg/dL — ABNORMAL HIGH (ref 0.44–1.00)
GFR, Estimated: 39 mL/min — ABNORMAL LOW (ref >60)
Glucose, Bld: 136 mg/dL — ABNORMAL HIGH (ref 70–99)
Potassium: 4.1 mmol/L (ref 3.5–5.1)
Sodium: 140 mmol/L (ref 135–145)
Total Bilirubin: 0.4 mg/dL (ref 0.0–1.2)
Total Protein: 6.3 g/dL — ABNORMAL LOW (ref 6.5–8.1)

## 2024-02-04 LAB — GLUCOSE, CAPILLARY
Glucose-Capillary: 211 mg/dL — ABNORMAL HIGH (ref 70–99)
Glucose-Capillary: 75 mg/dL (ref 70–99)
Glucose-Capillary: 141 mg/dL — ABNORMAL HIGH (ref 70–99)
Glucose-Capillary: 109 mg/dL — ABNORMAL HIGH (ref 70–99)

## 2024-02-04 LAB — TSH: TSH: 1.231 u[IU]/mL (ref 0.350–4.500)

## 2024-02-04 LAB — VITAMIN B12: Vitamin B-12: 360 pg/mL (ref 180–914)

## 2024-02-04 LAB — MAGNESIUM: Magnesium: 1.5 mg/dL — ABNORMAL LOW (ref 1.7–2.4)

## 2024-02-04 LAB — PHOSPHORUS: Phosphorus: 3.5 mg/dL (ref 2.5–4.6)

## 2024-02-04 MED ORDER — HYDRALAZINE HCL 20 MG/ML IJ SOLN
10.0000 mg | Freq: Four times a day (QID) | INTRAMUSCULAR | Status: DC | PRN
  Administered 2024-02-04 – 2024-02-05 (×2): 10 mg via INTRAVENOUS
  Filled 2024-02-04 (×2): qty 1

## 2024-02-04 MED ORDER — INSULIN GLARGINE 100 UNIT/ML ~~LOC~~ SOLN: 8.0000 [IU] | Freq: Every day | SUBCUTANEOUS | Status: DC

## 2024-02-04 MED ORDER — LACTATED RINGERS IV SOLN: INTRAVENOUS | Status: DC

## 2024-02-04 NOTE — Progress Notes (Signed)
 PROGRESS NOTE    Virginia [name]  ZOX:[id number] DOB: [birth year] DOA: 02/03/2024 PCP: Pcp, No   Brief Narrative:  Patient is an obese older appearing than her stated age African-American female with a past medical history significant for Mann[place] to diabetes mellitus type 2, generalized anxiety disorder, CKD stage IIIb with a baseline creatinine 1.3-1.7, anemia of chronic disease, as well as other comorbidities who presented generalized weakness fatigue and was found to be influenza A positive.  She reports 3 to 4 days of generalized weakness in the absence of focal weakness and this has been progressing worse.  She is also noted loose watery stools for the last few days as well as nonbloody nonbilious emesis over that time period as well.  She denies any discomfort in her abdomen but complains of some back pain on the right side associated with urinating.  She also reported a single loss of episode of consciousness while attempting to rise from a seated to standing position.  She was brought to the hospital and is now being worked up and had influenza A suspected UTI and is getting dehydrated and placed on antibiotics.  Will get PT OT to further evaluate and treat.  Assessment and Plan:  Generalized Weakness and Fatigue -Has a 3 to 4-day duration of generalized weakness, in the absence of any evidence of acute focal neurologic deficits, including no evidence of acute focal weakness to suggest acute CVA.   -Suspect multifactorial contributions, including contribution from physiologic stress stemming from presenting influenza A infection as well as resultant dehydration, potential and urinary tract infection.   -No e/o additional infectious process at this time, including chest x-ray that showed no evidence of acute cardiopulmonary process, including no evidence of infiltrate to suggest pneumonia.  -IV fluid hydration and PT OT further evaluate and treat and TSH was 1.231, B12 was 360, CK was  37  Influenza A  -Has a hx of coughing and states its started a few weeks ago go but got worse the last 3-4 days with rhinitis, rhinorrhea, subjective fever, myalgias -CXR done and showed -Outside the window for initiation of Oseltamivir -WBC Trend: Recent Labs  Lab 02/03/24 1217 02/04/24 0711  WBC 9.9 9.0  -Chest x-ray in a.m. and continue with supportive care  Dehydration -On presentation she had dry oral mucous membranes as well as an elevated specific gravity on her UA and presence of hyaline casts -Has been having GI losses with increased diarrhea and decreased oral intake. -Given 2 L of LR in the ED and then given maintenance at 125 mL/hr x 12 hours -Resume IVF with Maintenance with LR 75 mL/hr x 1 Day  Syncope -She had an episode of loss of consciousness that occurred on rising from a seated to standing position and was associated with lightheadedness dizziness and subjective sensation of impending loss of consciousness consistent with prodrome -Likely from orthostatic hypotension in the setting of her GI losses and poor p.o. intake. -Continue telemetry monitoring EKG showed no evidence of acute ischemic changes -Resume IV fluid hydration as below and check PT OT to further evaluate and treat -Repeat Orthostatics and consider Neurologic Testing if not improving  Acute Cystitis vs Pyelonephritis -Patient having Right Sided Flank Pain with urinating and continues to have urinary urgency -Urinalysis done and showed a cloudy appearance with amber color urine, 50 glucose, small leukocytes, negative nitrites, greater than 300 protein, many bacteria, 6-10 RBCs per high-powered field, 21-50 WBCs and urine cultures pending -Continue with empiric IV ceftriaxone  for now follow urine culture  Diarrhea and loose stools -Likely in the setting of influenza but will check C. difficile and GI pathogen panel  Diabetes Mellitus Type 2 -HbA1c was 11.0 in June 2024 and repeat here is  9.7 -Apparently had Discontinuation of outpatient Levemir recently -C/w Sensitive Novolog SSI AC/HS -Glucap Trend: Recent Labs  Lab 02/03/24 1217 02/04/24 0711  GLUCOSE 271* 136*  -Diabetes education coordinator consulted and recommending 8 units of Semglee daily  AKI on CKD Stage 3b Metabolic Acidosis -Baseline Cr around 1.3-1.7; BUN/Cr Trend: Recent Labs  Lab 02/03/24 1217 02/04/24 0711  BUN 19 14  CREATININE 2.10* 1.62*  -Renew IVF with LR at 75 mL/hr x 1 Day; Received IVF with LR at 125 mL/hr x 12 hours already  -Has a Slight Metabolic Acidosis with a CO2 of 21, AG of 9, and Chloride Level of 110 -Avoid Nephrotoxic Medications, Contrast Dyes, Hypotension and Dehydration to Ensure Adequate Renal Perfusion and will need to Renally Adjust Meds -Continue to Monitor and Trend Renal Function carefully and repeat CMP in the AM   Generalized Anxiety Disorder -C/w Fluoxetine 40 mg po Daily and Alprazolam 1 mg po TIDprn Anxiety   Hypomagnesemia -Patient's Mag Level Trend: Recent Labs  Lab 02/03/24 1552 02/04/24 0711  MG 1.5* 1.5*  -Replete with IV Mag Sulfate again -Continue to Monitor and Replete as Necessary -Repeat Mag in the AM   Normocytic Anemia -Baseline Hgb ranging from 10-12. Hgb/Hct Trend: Recent Labs  Lab 02/03/24 1217 02/04/24 0711  HGB 10.7* 10.0*  HCT 34.2* 31.1*  MCV 87.2 85.4  -Check Anemia Panel in  the AM -Continue to Monitor for S/Sx of Bleeding; No overt bleeding noted -Repeat CMP in the AM  Allergic Rhinitis -C/w Home Flonase  Hypoalbuminemia -Patient's Albumin Trend: Recent Labs  Lab 02/04/24 0711  ALBUMIN 2.0*  -Continue to Monitor and Trend and repeat CMP in the AM  Class I Obesity -Complicates overall prognosis and care -Estimated body mass index is 32.2 kg/m as calculated from the following:   Height as of this encounter: 5\' 1"  (1.549 m).   Weight as of this encounter: 77.3 kg.  -Weight Loss and Dietary Counseling given   DVT  prophylaxis: SCDs Start: 02/03/24 1953    Code Status: Full Code Family Communication: Discussed with family present at bedside  Disposition Plan:  Level of care: Telemetry Medical Status is: Inpatient Remains inpatient appropriate because: Needs further clinical improvement and anticipating D/C in the next 24-48 hours   Consultants:  None  Procedures:  As delineated as above  Antimicrobials:  Anti-infectives (From admission, onward)    Start     Dose/Rate Route Frequency Ordered Stop   02/04/24 2000  cefTRIAXone (ROCEPHIN) 1 g in sodium chloride 0.9 % 100 mL IVPB        1 g 200 mL/hr over 30 Minutes Intravenous Every 24 hours 02/03/24 1954     02/03/24 2000  cefTRIAXone (ROCEPHIN) 1 g in sodium chloride 0.9 % 100 mL IVPB        1 g 200 mL/hr over 30 Minutes Intravenous  Once 02/03/24 1948 02/03/24 2100       Subjective: Seen and examined at bedside and she still feels weak and states that she had a very poor p.o. intake and was having diarrhea last week or so.  States that she is coughing but states it is yellowish.  Also complains some pain when she urinates on the right side of her back.  No  other concerns or complaints at this time.  Objective: Vitals:   02/03/24 2244 02/04/24 0637 02/04/24 0918 02/04/24 1211  BP:  (!) 167/106 (!) 159/88 (!) 197/96  Pulse:  (!) 102 96 92  Resp:  18 14 14   Temp:  98.2 F (36.8 C) 98.3 F (36.8 C) 98 F (36.7 C)  TempSrc:   Oral Oral  SpO2:  99% 98% 99%  Weight: 77.3 kg     Height: 5\' 1"  (1.549 m)       Intake/Output Summary (Last 24 hours) at 02/04/2024 1535 Last data filed at 02/04/2024 1400 Gross per 24 hour  Intake 1261.58 ml  Output --  Net 1261.58 ml   Filed Weights   02/03/24 1205 02/03/24 2244  Weight: 75.8 kg 77.3 kg   Examination: Physical Exam:  Constitutional: WN/WD obese older appearing than her stated age African-American female in no acute distress Respiratory: Diminished to auscultation bilaterally, no  wheezing, rales, rhonchi or crackles. Normal respiratory effort and patient is not tachypenic. No accessory muscle use.  Unlabored breathing Cardiovascular: RRR, no murmurs / rubs / gallops. S1 and S2 auscultated. No extremity edema.  Abdomen: Soft, non-tender, distended secondary to body habitus. Bowel sounds positive.  GU: Deferred. Musculoskeletal: No clubbing / cyanosis of digits/nails. No joint deformity upper and lower extremities.  Skin: No rashes, lesions, ulcers on limited skin evaluation. No induration; Warm and dry.  Neurologic: CN 2-12 grossly intact with no focal deficits. Romberg sign and cerebellar reflexes not assessed.  Psychiatric: She is awake and alert  Data Reviewed: I have personally reviewed following labs and imaging studies  CBC: Recent Labs  Lab 02/03/24 1217 02/04/24 0711  WBC 9.9 9.0  NEUTROABS  --  4.1  HGB 10.7* 10.0*  HCT 34.2* 31.1*  MCV 87.2 85.4  PLT 383 366   Basic Metabolic Panel: Recent Labs  Lab 02/03/24 1217 02/03/24 1552 02/04/24 0711  NA 141  --  140  K 4.4  --  4.1  CL 109  --  110  CO2 22  --  21*  GLUCOSE 271*  --  136*  BUN 19  --  14  CREATININE 2.10*  --  1.62*  CALCIUM 8.3*  --  7.9*  MG  --  1.5* 1.5*  PHOS  --   --  3.5   GFR: Estimated Creatinine Clearance: 40.8 mL/min (A) (by C-G formula based on SCr of 1.62 mg/dL (H)). Liver Function Tests: Recent Labs  Lab 02/04/24 0711  AST 13*  ALT 29  ALKPHOS 129*  BILITOT 0.4  PROT 6.3*  ALBUMIN 2.0*   No results for input(s): "LIPASE", "AMYLASE" in the last 168 hours. No results for input(s): "AMMONIA" in the last 168 hours. Coagulation Profile: Recent Labs  Lab 02/03/24 2253  INR 1.1   Cardiac Enzymes: Recent Labs  Lab 02/03/24 2253  CKTOTAL 37*   BNP (last 3 results) No results for input(s): "PROBNP" in the last 8760 hours. HbA1C: Recent Labs    02/03/24 1217  HGBA1C 9.7*   CBG: Recent Labs  Lab 02/03/24 1414 02/03/24 2106 02/04/24 0848  02/04/24 1203  GLUCAP 226* 127* 211* 75   Lipid Profile: No results for input(s): "CHOL", "HDL", "LDLCALC", "TRIG", "CHOLHDL", "LDLDIRECT" in the last 72 hours. Thyroid Function Tests: Recent Labs    02/03/24 2253  TSH 1.231   Anemia Panel: Recent Labs    02/03/24 2253  VITAMINB12 360   Sepsis Labs: No results for input(s): "PROCALCITON", "LATICACIDVEN" in the last  168 hours.  Recent Results (from the past 240 hours)  Resp panel by RT-PCR (RSV, Flu A&B, Covid) Anterior Nasal Swab     Status: Abnormal   Collection Time: 02/03/24 12:09 PM   Specimen: Anterior Nasal Swab  Result Value Ref Range Status   SARS Coronavirus 2 by RT PCR NEGATIVE NEGATIVE Final   Influenza A by PCR POSITIVE (A) NEGATIVE Final   Influenza B by PCR NEGATIVE NEGATIVE Final    Comment: (NOTE) The Xpert Xpress SARS-CoV-2/FLU/RSV plus assay is intended as an aid in the diagnosis of influenza from Nasopharyngeal swab specimens and should not be used as a sole basis for treatment. Nasal washings and aspirates are unacceptable for Xpert Xpress SARS-CoV-2/FLU/RSV testing.  Fact Sheet for Patients: BloggerCourse.com  Fact Sheet for Healthcare Providers: SeriousBroker.it  This test is not yet approved or cleared by the [place] FDA and has been authorized for detection and/or diagnosis of SARS-CoV-2 by FDA under an Emergency Use Authorization (EUA). This EUA will remain in effect (meaning this test can be used) for the duration of the COVID-19 declaration under Section 564(b)(1) of the Act, 21 U.S.C. section 360bbb-3(b)(1), unless the authorization is terminated or revoked.     Resp Syncytial Virus by PCR NEGATIVE NEGATIVE Final    Comment: (NOTE) Fact Sheet for Patients: BloggerCourse.com  Fact Sheet for Healthcare Providers: SeriousBroker.it  This test is not yet approved or cleared by the  [place] FDA and has been authorized for detection and/or diagnosis of SARS-CoV-2 by FDA under an Emergency Use Authorization (EUA). This EUA will remain in effect (meaning this test can be used) for the duration of the COVID-19 declaration under Section 564(b)(1) of the Act, 21 U.S.C. section 360bbb-3(b)(1), unless the authorization is terminated or revoked.  Performed at [facility], [address], [place], [place] [address]     Radiology Studies: DG Chest 2 View Result Date: 02/03/2024 CLINICAL DATA:  Cough and congestion.  Headache. EXAM: CHEST - 2 VIEW COMPARISON:  05/22/2023. FINDINGS: Bilateral lung fields are clear. Bilateral costophrenic angles are clear. Normal cardio-mediastinal silhouette. No acute osseous abnormalities. The soft tissues are within normal limits. IMPRESSION: No active cardiopulmonary disease. Electronically Signed   By: Jules Schick M.D.   On: 02/03/2024 15:43   Scheduled Meds:  feeding supplement  237 mL Oral BID BM   FLUoxetine  40 mg Oral Daily   fluticasone  1 spray Each Nare QHS   guaiFENesin  600 mg Oral BID   insulin aspart  0-5 Units Subcutaneous QHS   insulin aspart  0-9 Units Subcutaneous TID WC   Continuous Infusions:  cefTRIAXone (ROCEPHIN)  IV     lactated ringers 75 mL/hr at 02/04/24 1034    LOS: 1 day   Marguerita Merles, DO [facility] Available via Epic secure chat 7am-7pm After these hours, please refer to coverage provider listed on [email or web address] 02/04/2024, 3:35 PM

## 2024-02-04 NOTE — Plan of Care (Signed)
  Problem: Metabolic: Goal: Ability to maintain appropriate glucose levels will improve Outcome: Progressing   Problem: Clinical Measurements: Goal: Cardiovascular complication will be avoided Outcome: Progressing   Problem: Coping: Goal: Level of anxiety will decrease Outcome: Progressing   Problem: Pain Managment: Goal: General experience of comfort will improve and/or be controlled Outcome: Progressing

## 2024-02-04 NOTE — Evaluation (Signed)
 Physical Therapy Evaluation Patient Details Name: Virginia [name] MRN: [id number] DOB: Mar 13, 1976 Today's Date: 02/04/2024  History of Present Illness  48 y.o. female who is admitted to [facility] on 02/03/2024 with generalized weakness, sycopal episode. Found to be +  Influenza A infection,  AKI, acute cystitis, dehydration. Past medical history significant for type 2 diabetes mellitus, generalized anxiety disorder, allergic rhinitis, CKD 3B , anemia of chronic disease.  Clinical Impression  Pt admitted with above diagnosis. Feels close to baseline but a little weaker than usual. Mod I with most mobility. Supervision with gait, no overt LOB, slower and cautious pace but steady without assistive device. She denies any pre-syncopal symptoms (BP elevated - see orthostatics below) RN and MD notified. Good family support. Do not anticipate any follow-up PT needs after d/c. Will follow during admission to ensure progress and safe dispo. Pt currently with functional limitations due to the deficits listed below (see PT Problem List). Pt will benefit from acute skilled PT to increase their independence and safety with mobility to allow discharge.         02/04/24 1050  Orthostatic Lying   BP- Lying (!) 172/101  Pulse- Lying 90  Orthostatic Sitting  BP- Sitting (!) 162/115  Pulse- Sitting 100  Orthostatic Standing at 0 minutes  BP- Standing at 0 minutes (!) 153/93  Pulse- Standing at 0 minutes 106  Orthostatic Standing at 3 minutes  BP- Standing at 3 minutes (!) 159/119  Pulse- Standing at 3 minutes 108       If plan is discharge home, recommend the following: Assistance with cooking/housework;Assist for transportation;Help with stairs or ramp for entrance   Can travel by private vehicle        Equipment Recommendations None recommended by PT  Recommendations for Other Services       Functional Status Assessment Patient has had a recent decline in their functional status and  demonstrates the ability to make significant improvements in function in a reasonable and predictable amount of time.     Precautions / Restrictions Precautions Precautions: None Recall of Precautions/Restrictions: Intact Restrictions Weight Bearing Restrictions Per Provider Order: No      Mobility  Bed Mobility Overal bed mobility: Modified Independent             General bed mobility comments: extra time, no assist    Transfers Overall transfer level: Modified independent Equipment used: None               General transfer comment: Slow to rise, stable without assistive device. Denies dizziness.    Ambulation/Gait Ambulation/Gait assistance: Supervision Gait Distance (Feet): 65 Feet Assistive device: None Gait Pattern/deviations: Step-through pattern, Decreased stride length Gait velocity: decr Gait velocity interpretation: <1.31 ft/sec, indicative of household ambulator   General Gait Details: Supervision for safety, denies dizziness or SOB but fatigued more than usual with short distance. Grossly stable, likely slower pace than baseline, no overt LOB noted.  Stairs            Wheelchair Mobility     Tilt Bed    Modified Rankin (Stroke Patients Only)       Balance Overall balance assessment: Needs assistance Sitting-balance support: No upper extremity supported, Feet supported Sitting balance-Leahy Scale: Normal     Standing balance support: No upper extremity supported Standing balance-Leahy Scale: Good  Pertinent Vitals/Pain Pain Assessment Pain Assessment: Faces Faces Pain Scale: Hurts little more Pain Location: abdomen and lower back Pain Descriptors / Indicators: Aching Pain Intervention(s): Monitored during session, Repositioned    Home Living Family/patient expects to be discharged to:: Private residence Living Arrangements: Spouse/significant other (Fiance) Available Help at  Discharge: Family;Available 24 hours/day Type of Home: House Home Access: Stairs to enter Entrance Stairs-Rails: None Entrance Stairs-Number of Steps: 3   Home Layout: Two level;Able to live on main level with bedroom/bathroom Home Equipment: None      Prior Function Prior Level of Function : Independent/Modified Independent;History of Falls (last six months)             Mobility Comments: ambulatory without AD ADLs Comments: ind     Extremity/Trunk Assessment   Upper Extremity Assessment Upper Extremity Assessment: Defer to OT evaluation    Lower Extremity Assessment Lower Extremity Assessment: Generalized weakness       Communication   Communication Communication: No apparent difficulties    Cognition Arousal: Alert Behavior During Therapy: WFL for tasks assessed/performed   PT - Cognitive impairments: No apparent impairments                         Following commands: Intact       Cueing Cueing Techniques: Verbal cues     General Comments General comments (skin integrity, edema, etc.): See orthostatics tab for vitals    Exercises General Exercises - Lower Extremity Ankle Circles/Pumps: AROM, Both, 10 reps, Supine   Assessment/Plan    PT Assessment Patient needs continued PT services  PT Problem List Decreased strength;Decreased activity tolerance;Decreased mobility;Decreased knowledge of use of DME;Cardiopulmonary status limiting activity       PT Treatment Interventions DME instruction;Gait training;Stair training;Functional mobility training;Therapeutic activities;Therapeutic exercise;Patient/family education    PT Goals (Current goals can be found in the Care Plan section)  Acute Rehab PT Goals Patient Stated Goal: Get well, return home PT Goal Formulation: With patient Time For Goal Achievement: 02/17/24 Potential to Achieve Goals: Good    Frequency Min 1X/week     Co-evaluation               AM-PAC PT "6 Clicks"  Mobility  Outcome Measure Help needed turning from your back to your side while in a flat bed without using bedrails?: None Help needed moving from lying on your back to sitting on the side of a flat bed without using bedrails?: None Help needed moving to and from a bed to a chair (including a wheelchair)?: None Help needed standing up from a chair using your arms (e.g., wheelchair or bedside chair)?: None Help needed to walk in hospital room?: A Little Help needed climbing 3-5 steps with a railing? : A Little 6 Click Score: 22    End of Session   Activity Tolerance: Patient tolerated treatment well Patient left: in bed;with call bell/phone within reach;with family/visitor present Nurse Communication: Mobility status PT Visit Diagnosis: Other abnormalities of gait and mobility (R26.89);Muscle weakness (generalized) (M62.81)    Time: 1610-9604 PT Time Calculation (min) (ACUTE ONLY): 26 min   Charges:   PT Evaluation $PT Eval Low Complexity: 1 Low PT Treatments $Gait Training: 8-22 mins PT General Charges $$ ACUTE PT VISIT: 1 Visit         Kathlyn Sacramento, PT, DPT [facility]  Rehabilitation Services Physical Therapist Office: [phone] Website: [email or web address]   Berton Mount 02/04/2024, 1:56 PM

## 2024-02-04 NOTE — Hospital Course (Addendum)
 Patient is an obese older appearing than her stated age African-American female with a past medical history significant for Mannam to diabetes mellitus type 2, generalized anxiety disorder, CKD stage IIIb with a baseline creatinine 1.3-1.7, anemia of chronic disease, as well as other comorbidities who presented generalized weakness fatigue and was found to be influenza A positive.  She reports 3 to 4 days of generalized weakness in the absence of focal weakness and this has been progressing worse.  She is also noted loose watery stools for the last few days as well as nonbloody nonbilious emesis over that time period as well.  She denies any discomfort in her abdomen but complains of some back pain on the right side associated with urinating.  She also reported a single loss of episode of consciousness while attempting to rise from a seated to standing position.  She was brought to the hospital and is now being worked up and had influenza A suspected UTI and is getting dehydrated and placed on antibiotics.  Will get PT OT to further evaluate and treat.  Assessment and Plan:  Generalized Weakness and Fatigue -Has a 3 to 4-day duration of generalized weakness, in the absence of any evidence of acute focal neurologic deficits, including no evidence of acute focal weakness to suggest acute CVA.   -Suspect multifactorial contributions, including contribution from physiologic stress stemming from presenting influenza A infection as well as resultant dehydration, potential and urinary tract infection.   -No e/o additional infectious process at this time, including chest x-ray that showed no evidence of acute cardiopulmonary process, including no evidence of infiltrate to suggest pneumonia.  -IV fluid hydration and PT OT further evaluate and treat and TSH was 1.231, B12 was 360, CK was 37  Influenza A  -Has a hx of coughing and states its started a few weeks ago go but got worse the last 3-4 days with rhinitis,  rhinorrhea, subjective fever, myalgias -CXR done and showed -Outside the window for initiation of Oseltamivir -WBC Trend: Recent Labs  Lab 02/03/24 1217 02/04/24 0711  WBC 9.9 9.0  -Chest x-ray in a.m. and continue with supportive care  Dehydration -On presentation she had dry oral mucous membranes as well as an elevated specific gravity on her UA and presence of hyaline casts -Has been having GI losses with increased diarrhea and decreased oral intake. -Given 2 L of LR in the ED and then given maintenance at 125 mL/hr x 12 hours -Resume IVF with Maintenance with LR 75 mL/hr x 1 Day  Syncope -She had an episode of loss of consciousness that occurred on rising from a seated to standing position and was associated with lightheadedness dizziness and subjective sensation of impending loss of consciousness consistent with prodrome -Likely from orthostatic hypotension in the setting of her GI losses and poor p.o. intake. -Continue telemetry monitoring EKG showed no evidence of acute ischemic changes -Resume IV fluid hydration as below and check PT OT to further evaluate and treat -Repeat Orthostatics and consider Neurologic Testing if not improving  Acute Cystitis vs Pyelonephritis -Patient having Right Sided Flank Pain with urinating and continues to have urinary urgency -Urinalysis done and showed a cloudy appearance with amber color urine, 50 glucose, small leukocytes, negative nitrites, greater than 300 protein, many bacteria, 6-10 RBCs per high-powered field, 21-50 WBCs and urine cultures pending -Continue with empiric IV ceftriaxone for now follow urine culture  Diarrhea and loose stools -Likely in the setting of influenza but will check C. difficile and  GI pathogen panel  Diabetes Mellitus Type 2 -HbA1c was 11.0 in June 2024 and repeat here is 9.7 -Apparently had Discontinuation of outpatient Levemir recently -C/w Sensitive Novolog SSI AC/HS -Glucap Trend: Recent Labs  Lab  02/03/24 1217 02/04/24 0711  GLUCOSE 271* 136*  -Diabetes education coordinator consulted and recommending 8 units of Semglee daily  AKI on CKD Stage 3b Metabolic Acidosis -Baseline Cr around 1.3-1.7; BUN/Cr Trend: Recent Labs  Lab 02/03/24 1217 02/04/24 0711  BUN 19 14  CREATININE 2.10* 1.62*  -Renew IVF with LR at 75 mL/hr x 1 Day; Received IVF with LR at 125 mL/hr x 12 hours already  -Has a Slight Metabolic Acidosis with a CO2 of 21, AG of 9, and Chloride Level of 110 -Avoid Nephrotoxic Medications, Contrast Dyes, Hypotension and Dehydration to Ensure Adequate Renal Perfusion and will need to Renally Adjust Meds -Continue to Monitor and Trend Renal Function carefully and repeat CMP in the AM   Generalized Anxiety Disorder -C/w Fluoxetine 40 mg po Daily and Alprazolam 1 mg po TIDprn Anxiety   Hypomagnesemia -Patient's Mag Level Trend: Recent Labs  Lab 02/03/24 1552 02/04/24 0711  MG 1.5* 1.5*  -Replete with IV Mag Sulfate again -Continue to Monitor and Replete as Necessary -Repeat Mag in the AM   Normocytic Anemia -Baseline Hgb ranging from 10-12. Hgb/Hct Trend: Recent Labs  Lab 02/03/24 1217 02/04/24 0711  HGB 10.7* 10.0*  HCT 34.2* 31.1*  MCV 87.2 85.4  -Check Anemia Panel in  the AM -Continue to Monitor for S/Sx of Bleeding; No overt bleeding noted -Repeat CMP in the AM  Allergic Rhinitis -C/w Home Flonase  Hypoalbuminemia -Patient's Albumin Trend: Recent Labs  Lab 02/04/24 0711  ALBUMIN 2.0*  -Continue to Monitor and Trend and repeat CMP in the AM  Class I Obesity -Complicates overall prognosis and care -Estimated body mass index is 32.2 kg/m as calculated from the following:   Height as of this encounter: 5\' 1"  (1.549 m).   Weight as of this encounter: 77.3 kg.  -Weight Loss and Dietary Counseling given

## 2024-02-04 NOTE — Plan of Care (Signed)

## 2024-02-04 NOTE — Inpatient Diabetes Management (Signed)
 Inpatient Diabetes Program Recommendations  AACE/ADA: New Consensus Statement on Inpatient Glycemic Control (2015)  Target Ranges:  Prepandial:   less than 140 mg/dL      Peak postprandial:   less than 180 mg/dL (1-2 hours)      Critically ill patients:  140 - 180 mg/dL   Lab Results  Component Value Date   GLUCAP 75 02/04/2024   HGBA1C 9.7 (H) 02/03/2024    Latest Reference Range & Units 03/02/23 02:37 05/23/23 06:35 02/03/24 12:17  Hemoglobin A1C 4.8 - 5.6 % 12.2 (H) 11.0 (H) 9.7 (H)  (H): Data is abnormally high Review of Glycemic Control  Latest Reference Range & Units 02/04/24 08:48 02/04/24 12:03  Glucose-Capillary 70 - 99 mg/dL 846 (H) 75  (H): Data is abnormally high Diabetes history: DM 2 Outpatient Diabetes medications:  Levemir 12 units daily (not taking) Current orders for Inpatient glycemic control:  Novolog 0-9 units tid with meals and HS  Inpatient Diabetes Program Recommendations:    Note A1C of 9.7%. This is an improvement from past A1C.  Spoke to patient by phone and she has not been taking Levemir as prescribed (Levemir has been discontinued by manufacturer).  She is interested in getting refills for FSL3 sensors as she has run out.  While in the hospital, consider adding Semglee 8 units daily and monitor. Needs very close follow-up with PCP.  Told patient that if she is still here on Monday, DM coordinator can see her and provide samples for sensors.  She has f/u visit planned with [facility].  Patient has no further questions at this time.   Thanks,  Lorenza Cambridge, RN, BC-ADM Inpatient Diabetes Coordinator Pager [phone]  (8a-5p)

## 2024-02-05 ENCOUNTER — Inpatient Hospital Stay (HOSPITAL_COMMUNITY): Payer: Medicaid Other

## 2024-02-05 DIAGNOSIS — D638 Anemia in other chronic diseases classified elsewhere: Secondary | ICD-10-CM | POA: Diagnosis not present

## 2024-02-05 DIAGNOSIS — N179 Acute kidney failure, unspecified: Secondary | ICD-10-CM | POA: Diagnosis not present

## 2024-02-05 DIAGNOSIS — B9620 Unspecified Escherichia coli [E. coli] as the cause of diseases classified elsewhere: Secondary | ICD-10-CM

## 2024-02-05 DIAGNOSIS — R531 Weakness: Secondary | ICD-10-CM | POA: Diagnosis not present

## 2024-02-05 DIAGNOSIS — N3000 Acute cystitis without hematuria: Secondary | ICD-10-CM | POA: Diagnosis not present

## 2024-02-05 LAB — COMPREHENSIVE METABOLIC PANEL
ALT: 21 U/L (ref 0–44)
AST: 13 U/L — ABNORMAL LOW (ref 15–41)
Albumin: 2 g/dL — ABNORMAL LOW (ref 3.5–5.0)
Alkaline Phosphatase: 130 U/L — ABNORMAL HIGH (ref 38–126)
Anion gap: 8 (ref 5–15)
BUN: 14 mg/dL (ref 6–20)
CO2: 21 mmol/L — ABNORMAL LOW (ref 22–32)
Calcium: 7.8 mg/dL — ABNORMAL LOW (ref 8.9–10.3)
Chloride: 109 mmol/L (ref 98–111)
Creatinine, Ser: 1.42 mg/dL — ABNORMAL HIGH (ref 0.44–1.00)
GFR, Estimated: 46 mL/min — ABNORMAL LOW (ref >60)
Glucose, Bld: 175 mg/dL — ABNORMAL HIGH (ref 70–99)
Potassium: 4.2 mmol/L (ref 3.5–5.1)
Sodium: 138 mmol/L (ref 135–145)
Total Bilirubin: 0.4 mg/dL (ref 0.0–1.2)
Total Protein: 5.9 g/dL — ABNORMAL LOW (ref 6.5–8.1)

## 2024-02-05 LAB — PHOSPHORUS: Phosphorus: 3.1 mg/dL (ref 2.5–4.6)

## 2024-02-05 LAB — CBC WITH DIFFERENTIAL/PLATELET
Abs Immature Granulocytes: 0.07 10*3/uL (ref 0.00–0.07)
Basophils Absolute: 0 10*3/uL (ref 0.0–0.1)
Basophils Relative: 0 %
Eosinophils Absolute: 0.2 10*3/uL (ref 0.0–0.5)
Eosinophils Relative: 2 %
HCT: 29.9 % — ABNORMAL LOW (ref 36.0–46.0)
Hemoglobin: 9.5 g/dL — ABNORMAL LOW (ref 12.0–15.0)
Immature Granulocytes: 1 %
Lymphocytes Relative: 37 %
Lymphs Abs: 3 10*3/uL (ref 0.7–4.0)
MCH: 27.5 pg (ref 26.0–34.0)
MCHC: 31.8 g/dL (ref 30.0–36.0)
MCV: 86.7 fL (ref 80.0–100.0)
Monocytes Absolute: 0.7 10*3/uL (ref 0.1–1.0)
Monocytes Relative: 9 %
Neutro Abs: 4.1 10*3/uL (ref 1.7–7.7)
Neutrophils Relative %: 51 %
Platelets: 335 10*3/uL (ref 150–400)
RBC: 3.45 MIL/uL — ABNORMAL LOW (ref 3.87–5.11)
RDW: 12.7 % (ref 11.5–15.5)
WBC: 8 10*3/uL (ref 4.0–10.5)
nRBC: 0 % (ref 0.0–0.2)

## 2024-02-05 LAB — RETICULOCYTES
Immature Retic Fract: 29.4 % — ABNORMAL HIGH (ref 2.3–15.9)
RBC.: 3.38 MIL/uL — ABNORMAL LOW (ref 3.87–5.11)
Retic Count, Absolute: 52.7 10*3/uL (ref 19.0–186.0)
Retic Ct Pct: 1.6 % (ref 0.4–3.1)

## 2024-02-05 LAB — IRON AND TIBC
Iron: 40 ug/dL (ref 28–170)
Saturation Ratios: 24 % (ref 10.4–31.8)
TIBC: 165 ug/dL — ABNORMAL LOW (ref 250–450)
UIBC: 125 ug/dL

## 2024-02-05 LAB — FERRITIN: Ferritin: 73 ng/mL (ref 11–307)

## 2024-02-05 LAB — GLUCOSE, CAPILLARY
Glucose-Capillary: 163 mg/dL — ABNORMAL HIGH (ref 70–99)
Glucose-Capillary: 120 mg/dL — ABNORMAL HIGH (ref 70–99)
Glucose-Capillary: 139 mg/dL — ABNORMAL HIGH (ref 70–99)
Glucose-Capillary: 201 mg/dL — ABNORMAL HIGH (ref 70–99)

## 2024-02-05 LAB — VITAMIN B12: Vitamin B-12: 318 pg/mL (ref 180–914)

## 2024-02-05 LAB — FOLATE: Folate: 8.6 ng/mL (ref >5.9)

## 2024-02-05 LAB — MAGNESIUM: Magnesium: 1.2 mg/dL — ABNORMAL LOW (ref 1.7–2.4)

## 2024-02-05 MED ORDER — LACTATED RINGERS IV SOLN: INTRAVENOUS | Status: AC

## 2024-02-05 MED ORDER — MAGNESIUM SULFATE 4 GM/100ML IV SOLN
4.0000 g | Freq: Once | INTRAVENOUS | Status: AC
  Administered 2024-02-05: 4 g via INTRAVENOUS
  Filled 2024-02-05: qty 100

## 2024-02-05 NOTE — Plan of Care (Signed)
  Problem: Education: Goal: Ability to describe self-care measures that may prevent or decrease complications (Diabetes Survival Skills Education) will improve Outcome: Progressing Goal: Individualized Educational Video(s) Outcome: Progressing   Problem: Coping: Goal: Ability to adjust to condition or change in health will improve Outcome: Progressing   Problem: Fluid Volume: Goal: Ability to maintain a balanced intake and output will improve Outcome: Progressing   Problem: Metabolic: Goal: Ability to maintain appropriate glucose levels will improve Outcome: Progressing   Problem: Health Behavior/Discharge Planning: Goal: Ability to identify and utilize available resources and services will improve Outcome: Progressing Goal: Ability to manage health-related needs will improve Outcome: Progressing   Problem: Skin Integrity: Goal: Risk for impaired skin integrity will decrease Outcome: Progressing   Problem: Tissue Perfusion: Goal: Adequacy of tissue perfusion will improve Outcome: Progressing   Problem: Education: Goal: Knowledge of General Education information will improve Description: Including pain rating scale, medication(s)/side effects and non-pharmacologic comfort measures Outcome: Progressing   Problem: Health Behavior/Discharge Planning: Goal: Ability to manage health-related needs will improve Outcome: Progressing

## 2024-02-05 NOTE — Evaluation (Signed)
 Occupational Therapy Evaluation Patient Details Name: Virginia [name] MRN: [id number] DOB: 01-Feb-1976 Today's Date: 02/05/2024   History of Present Illness   48 y.o. female who is admitted to [facility] on 02/03/2024 with generalized weakness, sycopal episode. Found to be +  Influenza A infection,  AKI, acute cystitis, dehydration. Past medical history significant for type 2 diabetes mellitus, generalized anxiety disorder, allergic rhinitis, CKD 3B , anemia of chronic disease.     Clinical Impressions Pt admitted for above, PTA pt was ind in ADLs/iADLs and driving, no AD for ambulation. On eval, pt was able to stand with Mod I and completing seated ADLs with setup, limited in session due to onset of dizziness and nausea. Pt also noted to have RLQ abdominal pain, notified RN for pain meds. OT to continue following pt acutely, she remains generally weak and would benefit from further strengthening. Anticipate no post acute OT needed once pt progresses medically.     If plan is discharge home, recommend the following:   Assistance with cooking/housework     Functional Status Assessment   Patient has had a recent decline in their functional status and demonstrates the ability to make significant improvements in function in a reasonable and predictable amount of time.     Equipment Recommendations   None recommended by OT     Recommendations for Other Services         Precautions/Restrictions   Precautions Precautions: None Recall of Precautions/Restrictions: Intact Restrictions Weight Bearing Restrictions Per Provider Order: No     Mobility Bed Mobility Overal bed mobility: Modified Independent                  Transfers Overall transfer level: Modified independent Equipment used: None               General transfer comment: Slow to rise, dizzy upon standing      Balance Overall balance assessment: Needs assistance Sitting-balance  support: No upper extremity supported, Feet supported Sitting balance-Leahy Scale: Normal     Standing balance support: No upper extremity supported Standing balance-Leahy Scale: Fair                             ADL either performed or assessed with clinical judgement   ADL Overall ADL's : Needs assistance/impaired Eating/Feeding: Independent;Sitting   Grooming: Sitting;Set up;Wash/dry face   Upper Body Bathing: Sitting;Set up   Lower Body Bathing: Set up;Sitting/lateral leans   Upper Body Dressing : Set up;Sitting   Lower Body Dressing: Set up;Sitting/lateral leans     Toilet Transfer Details (indicate cue type and reason): unable to fully assess                 Vision         Perception         Praxis         Pertinent Vitals/Pain Pain Assessment Pain Assessment: Faces Faces Pain Scale: Hurts even more Pain Location: R abdomen and low back Pain Descriptors / Indicators: Aching Pain Intervention(s): Monitored during session, Repositioned, Patient requesting pain meds-RN notified, Limited activity within patient's tolerance     Extremity/Trunk Assessment Upper Extremity Assessment Upper Extremity Assessment: Generalized weakness   Lower Extremity Assessment Lower Extremity Assessment: Generalized weakness   Cervical / Trunk Assessment Cervical / Trunk Assessment: Normal   Communication Communication Communication: No apparent difficulties   Cognition Arousal: Alert Behavior During Therapy: WFL for tasks assessed/performed  Cognition: No apparent impairments                               Following commands: Intact       Cueing  General Comments   Cueing Techniques: Verbal cues      Exercises     Shoulder Instructions      Home Living Family/patient expects to be discharged to:: Private residence Living Arrangements: Spouse/significant other (Fiance) Available Help at Discharge: Family;Available 24  hours/day Type of Home: House Home Access: Stairs to enter [employer] of Steps: 3 Entrance Stairs-Rails: None Home Layout: Two level;Able to live on main level with bedroom/bathroom     Bathroom Shower/Tub: Chief Strategy Officer: Handicapped height Bathroom Accessibility: No   Home Equipment: None          Prior Functioning/Environment Prior Level of Function : Independent/Modified Independent;History of Falls (last six months);Driving             Mobility Comments: ambulatory without AD ADLs Comments: ind    OT Problem List: Decreased strength;Pain   OT Treatment/Interventions: Self-care/ADL training;Therapeutic exercise;Balance training;Therapeutic activities      OT Goals(Current goals can be found in the care plan section)   Acute Rehab OT Goals Patient Stated Goal: To get better, go home OT Goal Formulation: With patient Time For Goal Achievement: 02/19/24 Potential to Achieve Goals: Good ADL Goals Pt Will Perform Grooming: with supervision;standing Pt Will Perform Lower Body Bathing: sit to/from stand;with supervision Pt Will Perform Lower Body Dressing: with supervision;sit to/from stand Pt Will Transfer to Toilet: ambulating;with supervision Pt/caregiver will Perform Home Exercise Program: Both right and left upper extremity;With theraband;Increased strength;Independently;With written HEP provided   OT Frequency:  Min 1X/week    Co-evaluation              AM-PAC OT "6 Clicks" Daily Activity     Outcome Measure Help from another person eating meals?: None Help from another person taking care of personal grooming?: A Little Help from another person toileting, which includes using toliet, bedpan, or urinal?: A Little Help from another person bathing (including washing, rinsing, drying)?: A Little Help from another person to put on and taking off regular upper body clothing?: A Little Help from another person to put on and  taking off regular lower body clothing?: A Little 6 Click Score: 19   End of Session Nurse Communication: Mobility status  Activity Tolerance: Treatment limited secondary to medical complications (Comment) Patient left: in bed;with call bell/phone within reach  OT Visit Diagnosis: Pain;Muscle weakness (generalized) (M62.81) Pain - Right/Left: Right Pain - part of body:  (abdomen)                Time: 5284-1324 OT Time Calculation (min): 21 min Charges:  OT General Charges $OT Visit: 1 Visit OT Evaluation $OT Eval Low Complexity: 1 Low  02/05/2024  AB, OTR/L  Acute Rehabilitation Services  Office: [phone]   Tristan Schroeder 02/05/2024, 10:53 AM

## 2024-02-05 NOTE — Progress Notes (Signed)
 PROGRESS NOTE    Virginia [name]  ZOX:[id number] DOB: 04-28-1976 DOA: 02/03/2024 PCP: Pcp, No   Brief Narrative:  Patient is an obese older appearing than her stated age African-American female with a past medical history significant for Mann[place] to diabetes mellitus type 2, generalized anxiety disorder, CKD stage IIIb with a baseline creatinine 1.3-1.7, anemia of chronic disease, as well as other comorbidities who presented generalized weakness fatigue and was found to be influenza A positive.  She reports 3 to 4 days of generalized weakness in the absence of focal weakness and this has been progressing worse.  She is also noted loose watery stools for the last few days as well as nonbloody nonbilious emesis over that time period as well.  She denies any discomfort in her abdomen but complains of some back pain on the right side associated with urinating.  She also reported a single loss of episode of consciousness while attempting to rise from a seated to standing position.    She was brought to the hospital and is now being worked up and had influenza A and an E Coli UTI and is being rehydrated and placed on antibiotics.  Will get PT OT to further evaluate and treat and they are recommending no follow up  Assessment and Plan:  Generalized Weakness and Fatigue -Has a 3 to 4-day duration of generalized weakness, in the absence of any evidence of acute focal neurologic deficits, including no evidence of acute focal weakness to suggest acute CVA.   -Suspect multifactorial contributions, including contribution from physiologic stress stemming from presenting influenza A infection as well as resultant dehydration, potential and urinary tract infection.   -No e/o additional infectious process at this time, including chest x-ray that showed no evidence of acute cardiopulmonary process, including no evidence of infiltrate to suggest pneumonia.  -IV fluid hydration as below -TSH was 1.231, B12 was 360,  CK was 37 -PT OT recommending no follow-up -Having right-sided abdominal discomfort and also having back pain so obtain a CT scan of the abdomen pelvis without contrast  Influenza A  -Has a hx of coughing and states its started a few weeks ago go but got worse the last 3-4 days with rhinitis, rhinorrhea, subjective fever, myalgias -CXR done and showed "No active cardiopulmonary disease." -Outside the window for initiation of Oseltamivir -WBC Trend: Recent Labs  Lab 02/03/24 1217 02/04/24 0711 02/05/24 0741  WBC 9.9 9.0 8.0  -Repeat Chest x-ray this AM done and pending -Continue with supportive care  Dehydration -On presentation she had dry oral mucous membranes as well as an elevated specific gravity on her UA and presence of hyaline casts -Has been having GI losses with increased diarrhea and decreased oral intake. -Given 2 L of LR in the ED and then given maintenance at 125 mL/hr x 12 hours -Resume IVF with Maintenance with LR 75 mL/hr and will continue for another 12 hours today given her continued improvement  Syncope -She had an episode of loss of consciousness that occurred on rising from a seated to standing position and was associated with lightheadedness dizziness and subjective sensation of impending loss of consciousness consistent with prodrome -Likely from orthostatic hypotension in the setting of her GI losses and poor p.o. intake. -Continue telemetry monitoring EKG showed no evidence of acute ischemic changes -Resume IV fluid hydration as below and check PT OT to further evaluate and treat -Repeat Orthostatics and consider Neurologic Testing if not improving  Acute E Coli UTI Cystitis vs Pyelonephritis -  Patient having Right Sided Flank Pain with urinating and continues to have urinary urgency -Urinalysis done and showed a cloudy appearance with amber color urine, 50 glucose, small leukocytes, negative nitrites, greater than 300 protein, many bacteria, 6-10 RBCs per  high-powered field, 21-50 WBCs and urine cultures showing >100,000 CFU of E Coli with Sensitivities pending -Was going to obtain a CT renal stone study however given her recent renal dysfunction we will hold off and obtain a CT scan of the abdomen pelvis without contrast -Continue with Empiric IV Ceftriaxone for now follow urine culture and adjust Abx as Necessary  Diarrhea and Loose Stools, improving Abdominal Pain -Likely in the setting of influenza but will check C. difficile and GI pathogen panel -Diarrhea has resolved but patient continued to complain of some abdominal pain so we will get a CT scan of the abdomen pelvis without contrast  Diabetes Mellitus Type 2 -HbA1c was 11.0 in June 2024 and repeat here is 9.7 -Apparently had Discontinuation of outpatient Levemir recently -C/w Sensitive Novolog SSI AC/HS -Glucap Trend: Recent Labs  Lab 02/03/24 1217 02/04/24 0711 02/05/24 0741  GLUCOSE 271* 136* 175*  -Diabetes education coordinator consulted and recommending 8 units of Semglee daily  AKI on CKD Stage 3b Metabolic Acidosis -Baseline Cr around 1.3-1.7; BUN/Cr Trend: Recent Labs  Lab 02/03/24 1217 02/04/24 0711 02/05/24 0741  BUN 19 14 14   CREATININE 2.10* 1.62* 1.42*  -Renew IVF with LR at 75 mL/hr x 1 Day; Received IVF with LR at 125 mL/hr x 12 hours already  -Has a Slight Metabolic Acidosis with a CO2 of 21, AG of 9, and Chloride Level of 110 -Avoid Nephrotoxic Medications, Contrast Dyes, Hypotension and Dehydration to Ensure Adequate Renal Perfusion and will need to Renally Adjust Meds -Continue to Monitor and Trend Renal Function carefully and repeat CMP in the AM   Generalized Anxiety Disorder -C/w Fluoxetine 40 mg po Daily and Alprazolam 1 mg po TIDprn Anxiety   Hypomagnesemia -Patient's Mag Level Trend: Recent Labs  Lab 02/03/24 1552 02/04/24 0711 02/05/24 0741  MG 1.5* 1.5* 1.2*  -Replete with IV 4 Mag Sulfate again -Continue to Monitor and Replete as  Necessary -Repeat Mag in the AM   Normocytic Anemia -Baseline Hgb ranging from 10-12. Hgb/Hct Trend: Recent Labs  Lab 02/03/24 1217 02/04/24 0711 02/05/24 0741  HGB 10.7* 10.0* 9.5*  HCT 34.2* 31.1* 29.9*  MCV 87.2 85.4 86.7  -Checked Anemia Panel showed an iron level of 40, UIBC of 125, TIBC of 165, saturation ratios of 24%, ferritin of 73, folate level 8.6 -Continue to Monitor for S/Sx of Bleeding; No overt bleeding noted -Repeat CMP in the AM  Allergic Rhinitis -C/w Home Fluticasone 50 mcg per actuation nasal spray 1 spray each nare daily nightly  Hypoalbuminemia -Patient's Albumin Trend: Recent Labs  Lab 02/04/24 0711 02/05/24 0741  ALBUMIN 2.0* 2.0*  -Continue to Monitor and Trend and repeat CMP in the AM  Class I Obesity -Complicates overall prognosis and care -Estimated body mass index is 33.57 kg/m as calculated from the following:   Height as of this encounter: 5\' 1"  (1.549 m).   Weight as of this encounter: 80.6 kg.  -Weight Loss and Dietary Counseling given   DVT prophylaxis: SCDs Start: 02/03/24 1953    Code Status: Full Code Family Communication: No family present at bedside  Disposition Plan:  Level of care: Telemetry Medical Status is: Inpatient Remains inpatient appropriate because: Needs further clinical improvement   Consultants:  None  Procedures:  As delineated as above  Antimicrobials:  Anti-infectives (From admission, onward)    Start     Dose/Rate Route Frequency Ordered Stop   02/04/24 2000  cefTRIAXone (ROCEPHIN) 1 g in sodium chloride 0.9 % 100 mL IVPB        1 g 200 mL/hr over 30 Minutes Intravenous Every 24 hours 02/03/24 1954     02/03/24 2000  cefTRIAXone (ROCEPHIN) 1 g in sodium chloride 0.9 % 100 mL IVPB        1 g 200 mL/hr over 30 Minutes Intravenous  Once 02/03/24 1948 02/03/24 2100       Subjective: Seen and examined at bedside and states that she is not feeling well today compared to yesterday and was complaining  some abdominal discomfort today on the right side specially the right lower quadrant.  States her diarrhea has improved.  No nausea or vomiting but just feels fatigued and extremely tired and weak.  No other concerns or complaints this time.  Objective: Vitals:   02/04/24 2013 02/05/24 0445 02/05/24 0500 02/05/24 0749  BP: (!) 169/99 131/75  (!) 189/109  Pulse: 90 79  97  Resp: 16 16    Temp: 97.9 F (36.6 C) 98.2 F (36.8 C)  98.2 F (36.8 C)  TempSrc: Oral Oral  Oral  SpO2: 100% 99%  100%  Weight:   80.6 kg   Height:        Intake/Output Summary (Last 24 hours) at 02/05/2024 1400 Last data filed at 02/05/2024 9629 Gross per 24 hour  Intake 963.81 ml  Output --  Net 963.81 ml   Filed Weights   02/03/24 1205 02/03/24 2244 02/05/24 0500  Weight: 75.8 kg 77.3 kg 80.6 kg   Examination: Physical Exam:  Constitutional: WN/WD obese African-American female in no acute distress and is very fatigued appearing Respiratory: Diminished to auscultation bilaterally with some coarse breath sounds, no wheezing, rales, rhonchi or crackles. Normal respiratory effort and patient is not tachypenic. No accessory muscle use.  Unlabored breathing and not wearing supplemental oxygen via nasal cannula Cardiovascular: RRR, no murmurs / rubs / gallops. S1 and S2 auscultated.  Abdomen: Soft, non-tender, distended secondary to body habitus. Bowel sounds positive.  GU: Deferred. Musculoskeletal: No clubbing / cyanosis of digits/nails. No joint deformity upper and lower extremities.  Skin: No rashes, lesions, ulcers on limited skin evaluation. No induration; Warm and dry.  Neurologic: CN 2-12 grossly intact with no focal deficits. Romberg sign cerebellar reflexes not assessed.  Psychiatric: Normal judgment and insight. Alert and oriented x 3. Normal mood and appropriate affect.   Data Reviewed: I have personally reviewed following labs and imaging studies  CBC: Recent Labs  Lab 02/03/24 1217  02/04/24 0711 02/05/24 0741  WBC 9.9 9.0 8.0  NEUTROABS  --  4.1 4.1  HGB 10.7* 10.0* 9.5*  HCT 34.2* 31.1* 29.9*  MCV 87.2 85.4 86.7  PLT 383 366 335   Basic Metabolic Panel: Recent Labs  Lab 02/03/24 1217 02/03/24 1552 02/04/24 0711 02/05/24 0741  NA 141  --  140 138  K 4.4  --  4.1 4.2  CL 109  --  110 109  CO2 22  --  21* 21*  GLUCOSE 271*  --  136* 175*  BUN 19  --  14 14  CREATININE 2.10*  --  1.62* 1.42*  CALCIUM 8.3*  --  7.9* 7.8*  MG  --  1.5* 1.5* 1.2*  PHOS  --   --  3.5 3.1  GFR: Estimated Creatinine Clearance: 47.6 mL/min (A) (by C-G formula based on SCr of 1.42 mg/dL (H)). Liver Function Tests: Recent Labs  Lab 02/04/24 0711 02/05/24 0741  AST 13* 13*  ALT 29 21  ALKPHOS 129* 130*  BILITOT 0.4 0.4  PROT 6.3* 5.9*  ALBUMIN 2.0* 2.0*   No results for input(s): "LIPASE", "AMYLASE" in the last 168 hours. No results for input(s): "AMMONIA" in the last 168 hours. Coagulation Profile: Recent Labs  Lab 02/03/24 2253  INR 1.1   Cardiac Enzymes: Recent Labs  Lab 02/03/24 2253  CKTOTAL 37*   BNP (last 3 results) No results for input(s): "PROBNP" in the last 8760 hours. HbA1C: Recent Labs    02/03/24 1217  HGBA1C 9.7*   CBG: Recent Labs  Lab 02/04/24 1203 02/04/24 1655 02/04/24 2013 02/05/24 0750 02/05/24 1137  GLUCAP 75 141* 109* 163* 120*   Lipid Profile: No results for input(s): "CHOL", "HDL", "LDLCALC", "TRIG", "CHOLHDL", "LDLDIRECT" in the last 72 hours. Thyroid Function Tests: Recent Labs    02/03/24 2253  TSH 1.231   Anemia Panel: Recent Labs    02/03/24 2253 02/05/24 0741  VITAMINB12 360  --   FOLATE  --  8.6  FERRITIN  --  73  TIBC  --  165*  IRON  --  40  RETICCTPCT  --  1.6   Sepsis Labs: No results for input(s): "PROCALCITON", "LATICACIDVEN" in the last 168 hours.  Recent Results (from the past 240 hours)  Resp panel by RT-PCR (RSV, Flu A&B, Covid) Anterior Nasal Swab     Status: Abnormal   Collection  Time: 02/03/24 12:09 PM   Specimen: Anterior Nasal Swab  Result Value Ref Range Status   SARS Coronavirus 2 by RT PCR NEGATIVE NEGATIVE Final   Influenza A by PCR POSITIVE (A) NEGATIVE Final   Influenza B by PCR NEGATIVE NEGATIVE Final    Comment: (NOTE) The Xpert Xpress SARS-CoV-2/FLU/RSV plus assay is intended as an aid in the diagnosis of influenza from Nasopharyngeal swab specimens and should not be used as a sole basis for treatment. Nasal washings and aspirates are unacceptable for Xpert Xpress SARS-CoV-2/FLU/RSV testing.  Fact Sheet for Patients: BloggerCourse.com  Fact Sheet for Healthcare Providers: SeriousBroker.it  This test is not yet approved or cleared by the [place] FDA and has been authorized for detection and/or diagnosis of SARS-CoV-2 by FDA under an Emergency Use Authorization (EUA). This EUA will remain in effect (meaning this test can be used) for the duration of the COVID-19 declaration under Section 564(b)(1) of the Act, 21 U.S.C. section 360bbb-3(b)(1), unless the authorization is terminated or revoked.     Resp Syncytial Virus by PCR NEGATIVE NEGATIVE Final    Comment: (NOTE) Fact Sheet for Patients: BloggerCourse.com  Fact Sheet for Healthcare Providers: SeriousBroker.it  This test is not yet approved or cleared by the [place] FDA and has been authorized for detection and/or diagnosis of SARS-CoV-2 by FDA under an Emergency Use Authorization (EUA). This EUA will remain in effect (meaning this test can be used) for the duration of the COVID-19 declaration under Section 564(b)(1) of the Act, 21 U.S.C. section 360bbb-3(b)(1), unless the authorization is terminated or revoked.  Performed at [facility], [address], [place], [place] [address]   Culture, [place] Urine     Status: Abnormal (Preliminary result)   Collection Time:  02/03/24  9:46 PM   Specimen: Urine, Random  Result Value Ref Range Status   Specimen Description URINE, RANDOM  Final   Special Requests NONE  Final   Culture (A)  Final    >=100,000 COLONIES/mL ESCHERICHIA COLI NO GROUP B STREP (S.AGALACTIAE) ISOLATED SUSCEPTIBILITIES TO FOLLOW Performed at [facility], [address], [place], [place] [address]    Report Status PENDING  Incomplete    Radiology Studies: DG Chest 2 View Result Date: 02/03/2024 CLINICAL DATA:  Cough and congestion.  Headache. EXAM: CHEST - 2 VIEW COMPARISON:  05/22/2023. FINDINGS: Bilateral lung fields are clear. Bilateral costophrenic angles are clear. Normal cardio-mediastinal silhouette. No acute osseous abnormalities. The soft tissues are within normal limits. IMPRESSION: No active cardiopulmonary disease. Electronically Signed   By: Jules Schick M.D.   On: 02/03/2024 15:43   Scheduled Meds:  feeding supplement  237 mL Oral BID BM   FLUoxetine  40 mg Oral Daily   fluticasone  1 spray Each Nare QHS   guaiFENesin  600 mg Oral BID   insulin aspart  0-5 Units Subcutaneous QHS   insulin aspart  0-9 Units Subcutaneous TID WC   Continuous Infusions:  cefTRIAXone (ROCEPHIN)  IV 1 g (02/04/24 2025)   lactated ringers 75 mL/hr at 02/05/24 1211   magnesium sulfate bolus IVPB 4 g (02/05/24 1220)    LOS: 2 days   Marguerita Merles, DO [facility] Available via Epic secure chat 7am-7pm After these hours, please refer to coverage provider listed on [email or web address] 02/05/2024, 2:00 PM

## 2024-02-06 ENCOUNTER — Inpatient Hospital Stay (HOSPITAL_COMMUNITY): Payer: Medicaid Other

## 2024-02-06 DIAGNOSIS — R531 Weakness: Secondary | ICD-10-CM | POA: Diagnosis not present

## 2024-02-06 DIAGNOSIS — N3000 Acute cystitis without hematuria: Secondary | ICD-10-CM | POA: Diagnosis not present

## 2024-02-06 DIAGNOSIS — D638 Anemia in other chronic diseases classified elsewhere: Secondary | ICD-10-CM | POA: Diagnosis not present

## 2024-02-06 DIAGNOSIS — N179 Acute kidney failure, unspecified: Secondary | ICD-10-CM | POA: Diagnosis not present

## 2024-02-06 LAB — COMPREHENSIVE METABOLIC PANEL
ALT: 17 U/L (ref 0–44)
AST: 13 U/L — ABNORMAL LOW (ref 15–41)
Albumin: 1.8 g/dL — ABNORMAL LOW (ref 3.5–5.0)
Alkaline Phosphatase: 108 U/L (ref 38–126)
Anion gap: 7 (ref 5–15)
BUN: 15 mg/dL (ref 6–20)
CO2: 21 mmol/L — ABNORMAL LOW (ref 22–32)
Calcium: 7.6 mg/dL — ABNORMAL LOW (ref 8.9–10.3)
Chloride: 108 mmol/L (ref 98–111)
Creatinine, Ser: 1.35 mg/dL — ABNORMAL HIGH (ref 0.44–1.00)
GFR, Estimated: 49 mL/min — ABNORMAL LOW (ref >60)
Glucose, Bld: 156 mg/dL — ABNORMAL HIGH (ref 70–99)
Potassium: 4.6 mmol/L (ref 3.5–5.1)
Sodium: 136 mmol/L (ref 135–145)
Total Bilirubin: 0.4 mg/dL (ref 0.0–1.2)
Total Protein: 5.7 g/dL — ABNORMAL LOW (ref 6.5–8.1)

## 2024-02-06 LAB — CBC WITH DIFFERENTIAL/PLATELET
Abs Immature Granulocytes: 0.09 10*3/uL — ABNORMAL HIGH (ref 0.00–0.07)
Basophils Absolute: 0 10*3/uL (ref 0.0–0.1)
Basophils Relative: 0 %
Eosinophils Absolute: 0.1 10*3/uL (ref 0.0–0.5)
Eosinophils Relative: 2 %
HCT: 28.9 % — ABNORMAL LOW (ref 36.0–46.0)
Hemoglobin: 9.4 g/dL — ABNORMAL LOW (ref 12.0–15.0)
Immature Granulocytes: 1 %
Lymphocytes Relative: 35 %
Lymphs Abs: 2.9 10*3/uL (ref 0.7–4.0)
MCH: 28.4 pg (ref 26.0–34.0)
MCHC: 32.5 g/dL (ref 30.0–36.0)
MCV: 87.3 fL (ref 80.0–100.0)
Monocytes Absolute: 0.6 10*3/uL (ref 0.1–1.0)
Monocytes Relative: 7 %
Neutro Abs: 4.6 10*3/uL (ref 1.7–7.7)
Neutrophils Relative %: 55 %
Platelets: 350 10*3/uL (ref 150–400)
RBC: 3.31 MIL/uL — ABNORMAL LOW (ref 3.87–5.11)
RDW: 12.7 % (ref 11.5–15.5)
WBC: 8.3 10*3/uL (ref 4.0–10.5)
nRBC: 0 % (ref 0.0–0.2)

## 2024-02-06 LAB — GLUCOSE, CAPILLARY
Glucose-Capillary: 205 mg/dL — ABNORMAL HIGH (ref 70–99)
Glucose-Capillary: 75 mg/dL (ref 70–99)
Glucose-Capillary: 178 mg/dL — ABNORMAL HIGH (ref 70–99)
Glucose-Capillary: 111 mg/dL — ABNORMAL HIGH (ref 70–99)
Glucose-Capillary: 162 mg/dL — ABNORMAL HIGH (ref 70–99)

## 2024-02-06 LAB — MAGNESIUM: Magnesium: 1.8 mg/dL (ref 1.7–2.4)

## 2024-02-06 LAB — CULTURE, OB URINE: Culture: >=100000 — AB

## 2024-02-06 LAB — PHOSPHORUS: Phosphorus: 3.6 mg/dL (ref 2.5–4.6)

## 2024-02-06 MED ORDER — AZITHROMYCIN 500 MG PO TABS
500.0000 mg | ORAL_TABLET | Freq: Every day | ORAL | Status: DC
  Administered 2024-02-06 – 2024-02-07 (×2): 500 mg via ORAL
  Filled 2024-02-06 (×2): qty 1

## 2024-02-06 MED ORDER — INSULIN GLARGINE-YFGN 100 UNIT/ML ~~LOC~~ SOPN: 8.0000 [IU] | PEN_INJECTOR | SUBCUTANEOUS | Status: DC

## 2024-02-06 MED ORDER — MAGNESIUM SULFATE 2 GM/50ML IV SOLN
2.0000 g | Freq: Once | INTRAVENOUS | Status: AC
  Administered 2024-02-06: 2 g via INTRAVENOUS
  Filled 2024-02-06: qty 50

## 2024-02-06 MED ORDER — ADULT MULTIVITAMIN W/MINERALS CH
1.0000 | ORAL_TABLET | Freq: Every day | ORAL | Status: DC
  Administered 2024-02-06 – 2024-02-07 (×2): 1 via ORAL
  Filled 2024-02-06 (×2): qty 1

## 2024-02-06 MED ORDER — INSULIN GLARGINE 100 UNIT/ML ~~LOC~~ SOLN
8.0000 [IU] | Freq: Every day | SUBCUTANEOUS | Status: DC
  Administered 2024-02-06: 8 [IU] via SUBCUTANEOUS
  Filled 2024-02-06 (×2): qty 0.08

## 2024-02-06 MED ORDER — PROCHLORPERAZINE EDISYLATE 10 MG/2ML IJ SOLN
10.0000 mg | Freq: Four times a day (QID) | INTRAMUSCULAR | Status: DC | PRN
  Administered 2024-02-06: 10 mg via INTRAVENOUS
  Filled 2024-02-06: qty 2

## 2024-02-06 MED ORDER — GUAIFENESIN ER 600 MG PO TB12
1200.0000 mg | ORAL_TABLET | Freq: Two times a day (BID) | ORAL | Status: DC
  Administered 2024-02-06 – 2024-02-07 (×3): 1200 mg via ORAL
  Filled 2024-02-06 (×3): qty 2

## 2024-02-06 NOTE — Plan of Care (Signed)

## 2024-02-06 NOTE — Progress Notes (Signed)
 Transition of Care [facility]) - Inpatient Brief Assessment   Patient Details  Name: Humaira Sculley MRN: [id number] Date of Birth: [birth year]  Transition of [facility]) CM/SW Contact:    Janae Bridgeman, RN Phone Number: 02/06/2024, 2:56 PM   Clinical Narrative: CM met with the patient at the bedside and patient lives at home with her boyfriend and plans to return home when medically stable for discharge.  Patient remains on room air.  Patient states that she has a PCP and plans to schedule a hospital follow up in the next week after she discharges home from the hospital.  Resource provided in the AVS for social services and transportation.  No other TOC needs at this time.   Transition of Care Asessment: Insurance and Status: (P) Insurance coverage has been reviewed Patient has primary care physician: (P) Yes (Patient states that she has a PCP but was unable to state Medical practive) Home environment has been reviewed: (P) from home with boyfriend Prior level of function:: (P) INdependent Prior/Current Home Services: (P) No current home services Social Drivers of Health Review: (P) SDOH reviewed interventions complete Readmission risk has been reviewed: (P) Yes Transition of care needs: (P) no transition of care needs at this time

## 2024-02-06 NOTE — Progress Notes (Signed)
   02/06/24 1443  Spiritual Encounters  Type of Visit Follow up  Reason for visit Advance directives   Second attempt made to meet with Pt and discuss Advance Care Planning.  Pt sleeping this morning when attempt was made and then again this afternoon.  Pt did not respond with chaplain called out her name. Chaplain will continue to attempt to meet with Pt.  Chaplain services remain available by Spiritual Consult or for emergent cases, paging [phone]  Chaplain Raelene Bott, MDiv [email or web address] [phone]

## 2024-02-06 NOTE — Progress Notes (Signed)
 Initial Nutrition Assessment  DOCUMENTATION CODES:   Obesity unspecified  INTERVENTION:  Magic cup TID with meals, each supplement provides 290 kcal and 9 grams of protein Multivitamin Continue Ensure Plus High Protein po BID, each supplement provides 350 kcal and 20 grams of protein.    NUTRITION DIAGNOSIS:   Inadequate oral intake related to nausea as evidenced by meal completion < 25%.    GOAL:   Patient will meet greater than or equal to 90% of their needs    MONITOR:   PO intake  REASON FOR ASSESSMENT:   Consult Poor PO  ASSESSMENT:  48 y.o. f, presented to ED from home with complaints of congestion, body aches, nausea, vomiting, diarrhea, and coughing x 2 weeks, along with dizziness which almost caused her to pass out. PMH: HTN, T2DM, anxiety, CKD,  anemia, bipolar, depression.  Patient sleeping hard to wake. She was not very verbal with RD on this day and unable to get good nutritional history on her. Review of chart revealed, Declined oral intake. Receiving Ensures accepting them ~ 50% of the time.  Possible poor intake related to reported Nausea, vomiting, and diarrhea.   Admit weight: 83.1 kg Weight history: 02/06/24 83.1 kg  05/22/23 79.4 kg  04/19/23 79.4 kg  03/04/23 79.6 kg  07/14/22 90.7 kg  07/13/22 90.7 kg  06/25/21 90.7 kg  05/23/20 104 kg  10/19/17 104.3 kg  05/16/17 99.8 kg   Hospital weight history:  02/06/24 04:11:03 83.1 kg 183.2 lbs  02/05/24 0500 80.6 kg 177.69 lbs  02/03/24 2244 77.3 kg 170.42 lbs  02/03/24 1205 75.8 kg 167 lbs     Average Meal Intake:  25% intake x 1 recorded meals  Nutritionally Relevant Medications: Scheduled Meds:  feeding supplement  237 mL Oral BID BM   insulin aspart  0-5 Units Subcutaneous QHS   insulin aspart  0-9 Units Subcutaneous TID WC    Labs Reviewed:  CBG ranges from 136-175 mg/dL over the last 24 hours HgbA1c 9.7    NUTRITION - FOCUSED PHYSICAL EXAM:  Flowsheet Row Most Recent  Value  Orbital Region No depletion  Upper Arm Region No depletion  Thoracic and Lumbar Region No depletion  Buccal Region No depletion  Temple Region No depletion  Clavicle Bone Region No depletion  Clavicle and Acromion Bone Region No depletion  Scapular Bone Region No depletion  Dorsal Hand No depletion  Patellar Region No depletion  Anterior Thigh Region No depletion  Posterior Calf Region No depletion  Edema (RD Assessment) None  Hair Reviewed  Eyes Reviewed  Mouth Reviewed  Skin Reviewed  Nails Reviewed       Diet Order:   Diet Order             Diet Carb Modified Fluid consistency: Thin; Room service appropriate? Yes  Diet effective now                   EDUCATION NEEDS:   Education needs have been addressed  Skin:  Skin Assessment: Reviewed RN Assessment  Last BM:  02/04/24  Height:   Ht Readings from Last 1 Encounters:  02/03/24 5\' 1"  (1.549 m)    Weight:   Wt Readings from Last 1 Encounters:  02/06/24 83.1 kg    Ideal Body Weight:     BMI:  Body mass index is 34.62 kg/m.  Estimated Nutritional Needs:   Kcal:  1500-1750 kcal  Protein:  65-80 g  Fluid:  15ml/kcal    Jamelle Haring  RDN, LDN Clinical Dietitian   If unable to reach, please contact "RD Inpatient" secure chat group between 8 am-4 pm daily"

## 2024-02-06 NOTE — Progress Notes (Signed)
 PROGRESS NOTE    Virginia [name]  WUJ:[id number] DOB: 1976-03-04 DOA: 02/03/2024 PCP: Pcp, No   Brief Narrative:  Patient is an obese African-American female with a past medical history significant for but not limited to to diabetes mellitus type 2, generalized anxiety disorder, CKD stage IIIb with a baseline creatinine 1.3-1.7, anemia of chronic disease, as well as other comorbidities who presented generalized weakness fatigue and was found to be influenza A positive as well as a UTI.  He has been having multiple loose stools as well and complaining of some back pain and abdominal discomfort with burning and dysuria.  She also experienced a single loss of episode of consciousness when she attempted to rise from a seated to standing position.  Status post IV fluid hydration and now skin planing of nausea but if improved likely can be discharged in the next 24 hours.  Assessment and Plan:  Generalized Weakness and Fatigue -Has a 3 to 4-day duration of generalized weakness, in the absence of any evidence of acute focal neurologic deficits, including no evidence of acute focal weakness to suggest acute CVA.   -Suspect multifactorial contributions, including contribution from physiologic stress stemming from presenting influenza A infection as well as resultant dehydration, potential and urinary tract infection.  -Further workup also reveals that she has a influenza pneumonia -Will initiate azithromycin in addition to the ceftriaxone that she is already getting for UTI -IV fluid hydration as below -TSH was 1.231, B12 was 360, CK was 37 -PT OT recommending no follow-up but will repeat orthostatics in the morning  Influenza A with pneumonia -Has a hx of coughing and states its started a few weeks ago go but got worse the last 3-4 days with rhinitis, rhinorrhea, subjective fever, myalgias -CXR done and showed "No active cardiopulmonary disease" however CT scan showed patchy airspace disease worse  on the left compared to right -Outside the window for initiation of Oseltamivir -WBC is not elevated at all -Repeat Chest x-ray in the a.m. -Will cover for superimposed bacterial infection and add azithromycin in addition to the ceftriaxone that she is getting for a UTI. -Continue with supportive care and check ambulatory home O2 screen prior to discharge in the morning  Dehydration, improved  -On presentation she had dry oral mucous membranes as well as an elevated specific gravity on her UA and presence of hyaline casts -Has been having GI losses with increased diarrhea and decreased oral intake. -She is provide is post IV fluid boluses and maintenance IV fluid and is able to take in orally but will address nausea as below  Syncope -Likely from orthostatic hypotension in the setting of her GI losses and poor p.o. intake. -Continue telemetry monitoring EKG showed no evidence of acute ischemic changes -IV hydration was stopped today and will check PT OT and repeat orthostatics in the AM.  Acute E Coli UTI Cystitis vs Pyelonephritis -Patient having Right Sided Flank Pain with urinating and continues to have urinary urgency -Urinalysis done and showed a cloudy appearance with amber color urine, 50 glucose, small leukocytes, negative nitrites, greater than 300 protein, many bacteria, 6-10 RBCs per high-powered field, 21-50 WBCs and urine cultures showing >100,000 CFU of E Coli with that was pansensitive except only resistant to ampicillin and ampicillin sulbactam -Was going to obtain a CT renal stone study however given her recent renal dysfunction we will hold off and obtain a CT scan of the abdomen pelvis without contrast -Continue with Empiric IV Ceftriaxone for now follow urine  culture and adjust Abx as Necessary and complete antibiotic course for 5 days  Diarrhea and Loose Stools, improving Abdominal Pain, improving Nausea, persistent -Likely in the setting of influenza but will check C.  difficile and GI pathogen panel -Diarrhea has resolved but patient continued to complain of some abdominal pain and nausea so obtained a CT Scan Abd/Pelvis which showed no real findings in the abdomen and pelvis but did show a patchy airspace opacities in bilateral lobes with the left being worse than the right, concerning for infection as well as trace bilateral effusions and small amount of air in the bladder -Continue supportive care and will change to Zofran that the patient has to IV Compazine every 6 given ineffectiveness of Zofran  Diabetes Mellitus Type 2 -HbA1c was 11.0 in June 2024 and repeat here is 9.7 -Longer taking outpatient Levemir but while she is hospitalized we will C/w Sensitive Novolog SSI AC/HS and added a carb modified diet.  Glucose levels are now improving and CBG ranges from 75-205 -Diabetes education coordinator consulted and recommending 8 units of Semglee daily; his diabetes education coordinator now provided the patient a freestyle libre and we will make further medication adjustments at discharge  AKI on CKD Stage 3b Metabolic Acidosis -Baseline Cr around 1.3-1.7; BUN/Cr Trend improved with IV fluid resuscitation and BUN/creatinine is now 58/1.35 -IV fluid hydration has now been stopped -Has a Slight Metabolic Acidosis with a CO2 of 21, AG of 7, and Chloride Level of 108 -Avoid Nephrotoxic Medications, Contrast Dyes, Hypotension and Dehydration to Ensure Adequate Renal Perfusion and will need to Renally Adjust Meds -Continue to Monitor and Trend Renal Function carefully and repeat CMP in the AM   Generalized Anxiety Disorder -C/w Fluoxetine 40 mg po Daily and Alprazolam 1 mg po TIDprn Anxiety   Hypomagnesemia -Patient's Mag was as low as 1.2 and improved to 1.8 today but will give an additional IV 2 mg of Mag Sulfate -Continue to Monitor and Replete as Necessary and Repeat Mag in the AM   Normocytic Anemia -Baseline Hgb ranging from 10-12. Hgb/Hct Trend  dropping since admission likely in the setting of dilution of IV fluid resuscitation but relatively stable now.  Last H/H was 9.4/28.9 -Checked Anemia Panel showed an iron level of 40, UIBC of 125, TIBC of 165, saturation ratios of 24%, ferritin of 73, folate level 8.6 -Continue to Monitor for S/Sx of Bleeding; No overt bleeding noted -Repeat CMP in the AM  Allergic Rhinitis -C/w Home Fluticasone 50 mcg per actuation nasal spray 1 spray each nare daily nightly  Hypoalbuminemia -Patient's Albumin Trend ranging from 1.8-2.0 -Continue to Monitor and Trend and repeat CMP in the AM  Class I Obesity -Complicates overall prognosis and care -Estimated body mass index is 34.62 kg/m as calculated from the following:   Height as of this encounter: 5\' 1"  (1.549 m).   Weight as of this encounter: 83.1 kg.  -Weight Loss and Dietary Counseling given   DVT prophylaxis: SCDs Start: 02/03/24 1953    Code Status: Full Code Family Communication: No family currently at bedside  Disposition Plan:  Level of care: Telemetry Medical Status is: Inpatient Remains inpatient appropriate because: Was persistently nauseous but anticipate discharge in the next 24 hours given that she is improving and because diarrhea has resolved   Consultants:  None  Procedures:  As delineated as above  Antimicrobials:  Anti-infectives (From admission, onward)    Start     Dose/Rate Route Frequency Ordered Stop   02/06/24  1000  azithromycin (ZITHROMAX) tablet 500 mg        500 mg Oral Daily 02/06/24 0744 02/11/24 0959   02/04/24 2000  cefTRIAXone (ROCEPHIN) 1 g in sodium chloride 0.9 % 100 mL IVPB        1 g 200 mL/hr over 30 Minutes Intravenous Every 24 hours 02/03/24 1954 02/07/24 2359   02/03/24 2000  cefTRIAXone (ROCEPHIN) 1 g in sodium chloride 0.9 % 100 mL IVPB        1 g 200 mL/hr over 30 Minutes Intravenous  Once 02/03/24 1948 02/03/24 2100      Subjective: Seen and examined at bedside and continues to  feel weak and complains of nausea but states that she is doing better overall from a diarrhea standpoint.  States that abdominal pain is intermittent and still persistent.  No chest pain or shortness of breath.  Denies any lightheadedness or dizziness.  Coughing occasionally.  Objective: Vitals:   02/06/24 0411 02/06/24 0806 02/06/24 1653 02/06/24 2018  BP: 137/77 (!) 159/84 (!) 151/84 132/78  Pulse: 82 89 76 78  Resp: 20   18  Temp: 98.3 F (36.8 C) 98.6 F (37 C) 97.7 F (36.5 C) 98.2 F (36.8 C)  TempSrc: Oral     SpO2: 99% 99% 98% 99%  Weight: 83.1 kg     Height:        Intake/Output Summary (Last 24 hours) at 02/06/2024 2122 Last data filed at 02/06/2024 1530 Gross per 24 hour  Intake 151.26 ml  Output --  Net 151.26 ml   Filed Weights   02/03/24 2244 02/05/24 0500 02/06/24 0411  Weight: 77.3 kg 80.6 kg 83.1 kg   Examination: Physical Exam:  Constitutional: WN/WD obese African-American female Respiratory: Diminished to auscultation bilaterally with some coarse breath sounds, no wheezing, rales, rhonchi or crackles. Normal respiratory effort and patient is not tachypenic. No accessory muscle use.  Unlabored breathing Cardiovascular: RRR, no murmurs / rubs / gallops. S1 and S2 auscultated. No extremity edema..  Abdomen: Soft, a little tender to palpate and distended secondary to body habitus. Bowel sounds positive.  GU: Deferred. Musculoskeletal: No clubbing / cyanosis of digits/nails. No joint deformity upper and lower extremities.  Skin: No rashes, lesions, ulcers on limited skin evaluation. No induration; Warm and dry.  Neurologic: CN 2-12 grossly intact with no focal deficits. Romberg sign and cerebellar reflexes not assessed.  Psychiatric: Normal judgment and insight. Alert and oriented x 3.   Data Reviewed: I have personally reviewed following labs and imaging studies  CBC: Recent Labs  Lab 02/03/24 1217 02/04/24 0711 02/05/24 0741 02/06/24 0750  WBC 9.9 9.0  8.0 8.3  NEUTROABS  --  4.1 4.1 4.6  HGB 10.7* 10.0* 9.5* 9.4*  HCT 34.2* 31.1* 29.9* 28.9*  MCV 87.2 85.4 86.7 87.3  PLT 383 366 335 350   Basic Metabolic Panel: Recent Labs  Lab 02/03/24 1217 02/03/24 1552 02/04/24 0711 02/05/24 0741 02/06/24 0750  NA 141  --  140 138 136  K 4.4  --  4.1 4.2 4.6  CL 109  --  110 109 108  CO2 22  --  21* 21* 21*  GLUCOSE 271*  --  136* 175* 156*  BUN 19  --  14 14 15   CREATININE 2.10*  --  1.62* 1.42* 1.35*  CALCIUM 8.3*  --  7.9* 7.8* 7.6*  MG  --  1.5* 1.5* 1.2* 1.8  PHOS  --   --  3.5 3.1 3.6  GFR: Estimated Creatinine Clearance: 50.9 mL/min (A) (by C-G formula based on SCr of 1.35 mg/dL (H)). Liver Function Tests: Recent Labs  Lab 02/04/24 0711 02/05/24 0741 02/06/24 0750  AST 13* 13* 13*  ALT 29 21 17   ALKPHOS 129* 130* 108  BILITOT 0.4 0.4 0.4  PROT 6.3* 5.9* 5.7*  ALBUMIN 2.0* 2.0* 1.8*   No results for input(s): "LIPASE", "AMYLASE" in the last 168 hours. No results for input(s): "AMMONIA" in the last 168 hours. Coagulation Profile: Recent Labs  Lab 02/03/24 2253  INR 1.1   Cardiac Enzymes: Recent Labs  Lab 02/03/24 2253  CKTOTAL 37*   BNP (last 3 results) No results for input(s): "PROBNP" in the last 8760 hours. HbA1C: No results for input(s): "HGBA1C" in the last 72 hours.  CBG: Recent Labs  Lab 02/06/24 0816 02/06/24 1116 02/06/24 1200 02/06/24 1654 02/06/24 2019  GLUCAP 205* 75 178* 111* 162*   Lipid Profile: No results for input(s): "CHOL", "HDL", "LDLCALC", "TRIG", "CHOLHDL", "LDLDIRECT" in the last 72 hours. Thyroid Function Tests: Recent Labs    02/03/24 2253  TSH 1.231   Anemia Panel: Recent Labs    02/03/24 2253 02/05/24 0741 02/05/24 1557  VITAMINB12 360  --  318  FOLATE  --  8.6  --   FERRITIN  --  73  --   TIBC  --  165*  --   IRON  --  40  --   RETICCTPCT  --  1.6  --    Sepsis Labs: No results for input(s): "PROCALCITON", "LATICACIDVEN" in the last 168 hours.  Recent  Results (from the past 240 hours)  Resp panel by RT-PCR (RSV, Flu A&B, Covid) Anterior Nasal Swab     Status: Abnormal   Collection Time: 02/03/24 12:09 PM   Specimen: Anterior Nasal Swab  Result Value Ref Range Status   SARS Coronavirus 2 by RT PCR NEGATIVE NEGATIVE Final   Influenza A by PCR POSITIVE (A) NEGATIVE Final   Influenza B by PCR NEGATIVE NEGATIVE Final    Comment: (NOTE) The Xpert Xpress SARS-CoV-2/FLU/RSV plus assay is intended as an aid in the diagnosis of influenza from Nasopharyngeal swab specimens and should not be used as a sole basis for treatment. Nasal washings and aspirates are unacceptable for Xpert Xpress SARS-CoV-2/FLU/RSV testing.  Fact Sheet for Patients: BloggerCourse.com  Fact Sheet for Healthcare Providers: SeriousBroker.it  This test is not yet approved or cleared by the [place] FDA and has been authorized for detection and/or diagnosis of SARS-CoV-2 by FDA under an Emergency Use Authorization (EUA). This EUA will remain in effect (meaning this test can be used) for the duration of the COVID-19 declaration under Section 564(b)(1) of the Act, 21 U.S.C. section 360bbb-3(b)(1), unless the authorization is terminated or revoked.     Resp Syncytial Virus by PCR NEGATIVE NEGATIVE Final    Comment: (NOTE) Fact Sheet for Patients: BloggerCourse.com  Fact Sheet for Healthcare Providers: SeriousBroker.it  This test is not yet approved or cleared by the [place] FDA and has been authorized for detection and/or diagnosis of SARS-CoV-2 by FDA under an Emergency Use Authorization (EUA). This EUA will remain in effect (meaning this test can be used) for the duration of the COVID-19 declaration under Section 564(b)(1) of the Act, 21 U.S.C. section 360bbb-3(b)(1), unless the authorization is terminated or revoked.  Performed at [facility]  Lab, [address], [place], [place] [address]   Culture, [place] Urine     Status: Abnormal  Collection Time: 02/03/24  9:46 PM   Specimen: Urine, Random  Result Value Ref Range Status   Specimen Description URINE, RANDOM  Final   Special Requests NONE  Final   Culture (A)  Final    >=100,000 COLONIES/mL ESCHERICHIA COLI NO GROUP B STREP (S.AGALACTIAE) ISOLATED Performed at [facility] Lab, [address], [place], [place] [address]    Report Status 02/06/2024 FINAL  Final   Organism ID, Bacteria ESCHERICHIA COLI (A)  Final      Susceptibility   Escherichia coli - MIC*    AMPICILLIN >=32 RESISTANT Resistant     CEFEPIME <=0.12 SENSITIVE Sensitive     CEFTRIAXONE <=0.25 SENSITIVE Sensitive     CIPROFLOXACIN <=0.25 SENSITIVE Sensitive     GENTAMICIN <=1 SENSITIVE Sensitive     IMIPENEM <=0.25 SENSITIVE Sensitive     NITROFURANTOIN <=16 SENSITIVE Sensitive     TRIMETH/SULFA <=20 SENSITIVE Sensitive     AMPICILLIN/SULBACTAM >=32 RESISTANT Resistant     PIP/TAZO <=4 SENSITIVE Sensitive ug/mL    * >=100,000 COLONIES/mL ESCHERICHIA COLI    Radiology Studies: CT ABDOMEN PELVIS WO CONTRAST Result Date: 02/06/2024 CLINICAL DATA:  Right-sided pain EXAM: CT ABDOMEN AND PELVIS WITHOUT CONTRAST TECHNIQUE: Multidetector CT imaging of the abdomen and pelvis was performed following the standard protocol without IV contrast. RADIATION DOSE REDUCTION: This exam was performed according to the departmental dose-optimization program which includes automated exposure control, adjustment of the mA and/or kV according to patient size and/or use of iterative reconstruction technique. COMPARISON:  CT angiogram chest abdomen and pelvis 05/23/2023 FINDINGS: Lower chest: Patchy airspace opacities are seen in the bilateral lower lobes, left greater than right, worrisome for infection. There are trace bilateral pleural effusions. Hepatobiliary: No focal liver abnormality is seen. Status post cholecystectomy. No biliary  dilatation. Pancreas: Unremarkable. No pancreatic ductal dilatation or surrounding inflammatory changes. Spleen: Normal in size without focal abnormality. Adrenals/Urinary Tract: There is a small amount of air in the bladder. The bladder is otherwise within normal limits. The adrenal glands and kidneys appear within normal limits. Stomach/Bowel: There are postsurgical changes in the stomach. Appendix appears normal. No evidence of bowel wall thickening, distention, or inflammatory changes. Vascular/Lymphatic: No significant vascular findings are present. No enlarged abdominal or pelvic lymph nodes. Reproductive: Uterus and bilateral adnexa are unremarkable. Other: No abdominal wall hernia or abnormality. No abdominopelvic ascites. Musculoskeletal: No acute or significant osseous findings. IMPRESSION: 1. Patchy airspace opacities in the bilateral lower lobes, left greater than right, worrisome for infection. 2. Trace bilateral pleural effusions. 3. Small amount of air in the bladder, possibly related to recent instrumentation. Correlate clinically. Electronically Signed   By: Darliss Cheney M.D.   On: 02/06/2024 01:46   DG CHEST PORT 1 VIEW Result Date: 02/05/2024 CLINICAL DATA:  [id number] Influenza A [id number] EXAM: PORTABLE CHEST 1 VIEW COMPARISON:  02/03/2024 FINDINGS: The heart size and mediastinal contours are within normal limits. Small band-like opacity at the left lung base. Right lung is clear. No pleural effusion or pneumothorax. The visualized skeletal structures are unremarkable. IMPRESSION: Small band-like opacity at the left lung base, favor atelectasis. Electronically Signed   By: Duanne Guess D.O.   On: 02/05/2024 17:01   Scheduled Meds:  azithromycin  500 mg Oral Daily   feeding supplement  237 mL Oral BID BM   FLUoxetine  40 mg Oral Daily   fluticasone  1 spray Each Nare QHS   guaiFENesin  1,200 mg Oral BID   insulin aspart  0-5  Units Subcutaneous QHS   insulin aspart  0-9 Units  Subcutaneous TID WC   insulin glargine  8 Units Subcutaneous QHS   multivitamin with minerals  1 tablet Oral Daily   Continuous Infusions:  cefTRIAXone (ROCEPHIN)  IV 1 g (02/06/24 2042)    LOS: 3 days   Marguerita Merles, DO [facility] Available via Epic secure chat 7am-7pm After these hours, please refer to coverage provider listed on [email or web address] 02/06/2024, 9:22 PM

## 2024-02-06 NOTE — Inpatient Diabetes Management (Addendum)
 Inpatient Diabetes Program Recommendations  AACE/ADA: New Consensus Statement on Inpatient Glycemic Control (2015)  Target Ranges:  Prepandial:   less than 140 mg/dL      Peak postprandial:   less than 180 mg/dL (1-2 hours)      Critically ill patients:  140 - 180 mg/dL   Lab Results  Component Value Date   GLUCAP 205 (H) 02/06/2024   HGBA1C 9.7 (H) 02/03/2024    Latest Reference Range & Units 02/05/24 07:50 02/05/24 11:37 02/05/24 14:55 02/05/24 20:27 02/06/24 08:16  Glucose-Capillary 70 - 99 mg/dL 161 (H) 096 (H) 045 (H) 201 (H) 205 (H)  (H): Data is abnormally high  Diabetes history: DM 2 Outpatient Diabetes medications:  Levemir 12 units daily (not taking) Current orders for Inpatient glycemic control:  Novolog 0-9 units tid with meals and HS  Inpatient Diabetes Program Recommendations:   Gave patient 3 Libre sensors for home use. Patient has been using in the past and feels comfortable starting new Libre3 on discharge.   Discharge Recommendations: Other recommendations: Patient needs refill for [facility] - Order number [id number] Long acting recommendations: Insulin Glargine (LANTUS) Solostar Pen dose to be determined at discharge  Supply/Referral recommendations: Glucometer Test strips Lancet device Lancets Pen needles - standard   Use Adult Diabetes Insulin Treatment Post Discharge order set.  Thank you, Virginia [name]. Vence Lalor, RN, MSN, CDCES  Diabetes Coordinator Inpatient Glycemic Control Team Team Pager [phone] (8am-5pm) 02/06/2024 11:15 AM

## 2024-02-06 NOTE — Progress Notes (Signed)
 Mobility Specialist: Progress Note   02/06/24 1630  Mobility  Activity Ambulated with assistance in hallway  Level of Assistance Standby assist, set-up cues, supervision of patient - no hands on  Assistive Device None  Distance Ambulated (ft) 400 ft  Activity Response Tolerated well  Mobility Referral Yes  Mobility visit 1 Mobility  Mobility Specialist Start Time (ACUTE ONLY) 1332  Mobility Specialist Stop Time (ACUTE ONLY) 1340  Mobility Specialist Time Calculation (min) (ACUTE ONLY) 8 min    Pt was agreeable to mobility session - received in chair. No complaints. SV throughout with pt occasionally holding on to object for steadiness. Returned to room without fault. Left on EOB with all needs met, call bell in reach.   Maurene Capes Mobility Specialist Please contact via SecureChat or Rehab office at [phone]

## 2024-02-06 NOTE — Progress Notes (Signed)
 Physical Therapy Treatment Patient Details Name: Virginia [name] MRN: [id number] DOB: 02/14/1976 Today's Date: 02/06/2024   History of Present Illness 48 y.o. female who is admitted to [facility] on 02/03/2024 with generalized weakness, sycopal episode. Found to be +  Influenza A infection,  AKI, acute cystitis, dehydration. Past medical history significant for type 2 diabetes mellitus, generalized anxiety disorder, allergic rhinitis, CKD 3B , anemia of chronic disease.    PT Comments  Mobilizing at a mod I level. Declined stair training today but suspect this will no be an issue if she maintains current level of function prior to d/c. Pt SpO2 100% on room air, HR in 90s throughout session. No evidence of LOB, no complaints of dizziness today. Still with abdominal discomfort, occasionally coughing. Encouraged OOB more frequently. Reviewed LE exercises. If she does well next visit likely d/c from acute PT. No follow-up needs from a rehab standpoint anticipated.    If plan is discharge home, recommend the following: Assistance with cooking/housework;Assist for transportation;Help with stairs or ramp for entrance   Can travel by private vehicle        Equipment Recommendations  None recommended by PT    Recommendations for Other Services       Precautions / Restrictions Precautions Precautions: None Recall of Precautions/Restrictions: Intact Restrictions Weight Bearing Restrictions Per Provider Order: No     Mobility  Bed Mobility Overal bed mobility: Modified Independent             General bed mobility comments: extra time, no assist    Transfers Overall transfer level: Modified independent Equipment used: None               General transfer comment: Stable upon rising.    Ambulation/Gait Ambulation/Gait assistance: Modified independent (Device/Increase time) Gait Distance (Feet): 200 Feet Assistive device: None Gait Pattern/deviations:  Step-through pattern, Decreased stride length Gait velocity: decr Gait velocity interpretation: 1.31 - 2.62 ft/sec, indicative of limited community ambulator   General Gait Details: A little slower than baseline likely. Able tolerate DGI (see below.) No overt LOB, no assistive device.   Stairs Stairs:  (Declines)           Wheelchair Mobility     Tilt Bed    Modified Rankin (Stroke Patients Only)       Balance Overall balance assessment: Needs assistance Sitting-balance support: No upper extremity supported, Feet supported Sitting balance-Leahy Scale: Normal     Standing balance support: No upper extremity supported Standing balance-Leahy Scale: Good                   Standardized Balance Assessment Standardized Balance Assessment : Dynamic Gait Index   Dynamic Gait Index Level Surface: Normal Change in Gait Speed: Normal Gait with Horizontal Head Turns: Normal Gait with Vertical Head Turns: Normal Gait and Pivot Turn: Normal Step Over Obstacle: Mild Impairment Step Around Obstacles: Normal Steps: Mild Impairment Total Score: 22      Communication Communication Communication: No apparent difficulties  Cognition Arousal: Alert Behavior During Therapy: WFL for tasks assessed/performed   PT - Cognitive impairments: No apparent impairments                         Following commands: Intact      Cueing Cueing Techniques: Verbal cues  Exercises General Exercises - Lower Extremity Ankle Circles/Pumps: AROM, Both, 10 reps, Supine Quad Sets: Strengthening, Both, 10 reps, Seated Gluteal Sets: Strengthening, Both, 10 reps,  Seated    General Comments General comments (skin integrity, edema, etc.): Spo2 100% on RA, HR 90s.      Pertinent Vitals/Pain Pain Assessment Pain Assessment: Faces Faces Pain Scale: Hurts even more Pain Location: abdomen Pain Descriptors / Indicators: Aching Pain Intervention(s): Monitored during session     Home Living                          Prior Function            PT Goals (current goals can now be found in the care plan section) Acute Rehab PT Goals Patient Stated Goal: Get well, return home PT Goal Formulation: With patient Time For Goal Achievement: 02/17/24 Potential to Achieve Goals: Good Progress towards PT goals: Progressing toward goals    Frequency    Min 1X/week      PT Plan      Co-evaluation              AM-PAC PT "6 Clicks" Mobility   Outcome Measure  Help needed turning from your back to your side while in a flat bed without using bedrails?: None Help needed moving from lying on your back to sitting on the side of a flat bed without using bedrails?: None Help needed moving to and from a bed to a chair (including a wheelchair)?: None Help needed standing up from a chair using your arms (e.g., wheelchair or bedside chair)?: None Help needed to walk in hospital room?: None Help needed climbing 3-5 steps with a railing? : A Little 6 Click Score: 23    End of Session   Activity Tolerance: Patient tolerated treatment well Patient left: with call bell/phone within reach;in chair Nurse Communication: Mobility status PT Visit Diagnosis: Other abnormalities of gait and mobility (R26.89);Muscle weakness (generalized) (M62.81)     Time: 1010-1026 PT Time Calculation (min) (ACUTE ONLY): 16 min  Charges:    $Therapeutic Activity: 8-22 mins PT General Charges $$ ACUTE PT VISIT: 1 Visit                     Kathlyn Sacramento, PT, DPT [facility]  Rehabilitation Services Physical Therapist Office: [phone] Website: [email or web address]    Berton Mount 02/06/2024, 11:31 AM

## 2024-02-07 DIAGNOSIS — D638 Anemia in other chronic diseases classified elsewhere: Secondary | ICD-10-CM | POA: Diagnosis not present

## 2024-02-07 DIAGNOSIS — R531 Weakness: Secondary | ICD-10-CM | POA: Diagnosis not present

## 2024-02-07 DIAGNOSIS — N3000 Acute cystitis without hematuria: Secondary | ICD-10-CM | POA: Diagnosis not present

## 2024-02-07 DIAGNOSIS — N179 Acute kidney failure, unspecified: Secondary | ICD-10-CM | POA: Diagnosis not present

## 2024-02-07 LAB — CBC WITH DIFFERENTIAL/PLATELET
Abs Immature Granulocytes: 0.07 10*3/uL (ref 0.00–0.07)
Basophils Absolute: 0 10*3/uL (ref 0.0–0.1)
Basophils Relative: 0 %
Eosinophils Absolute: 0.2 10*3/uL (ref 0.0–0.5)
Eosinophils Relative: 2 %
HCT: 30.1 % — ABNORMAL LOW (ref 36.0–46.0)
Hemoglobin: 9.4 g/dL — ABNORMAL LOW (ref 12.0–15.0)
Immature Granulocytes: 1 %
Lymphocytes Relative: 33 %
Lymphs Abs: 2.7 10*3/uL (ref 0.7–4.0)
MCH: 27.7 pg (ref 26.0–34.0)
MCHC: 31.2 g/dL (ref 30.0–36.0)
MCV: 88.8 fL (ref 80.0–100.0)
Monocytes Absolute: 0.5 10*3/uL (ref 0.1–1.0)
Monocytes Relative: 6 %
Neutro Abs: 4.7 10*3/uL (ref 1.7–7.7)
Neutrophils Relative %: 58 %
Platelets: 350 10*3/uL (ref 150–400)
RBC: 3.39 MIL/uL — ABNORMAL LOW (ref 3.87–5.11)
RDW: 12.7 % (ref 11.5–15.5)
WBC: 8.2 10*3/uL (ref 4.0–10.5)
nRBC: 0 % (ref 0.0–0.2)

## 2024-02-07 LAB — COMPREHENSIVE METABOLIC PANEL
ALT: 15 U/L (ref 0–44)
AST: 14 U/L — ABNORMAL LOW (ref 15–41)
Albumin: 1.9 g/dL — ABNORMAL LOW (ref 3.5–5.0)
Alkaline Phosphatase: 120 U/L (ref 38–126)
Anion gap: 10 (ref 5–15)
BUN: 18 mg/dL (ref 6–20)
CO2: 23 mmol/L (ref 22–32)
Calcium: 7.8 mg/dL — ABNORMAL LOW (ref 8.9–10.3)
Chloride: 104 mmol/L (ref 98–111)
Creatinine, Ser: 1.23 mg/dL — ABNORMAL HIGH (ref 0.44–1.00)
GFR, Estimated: 55 mL/min — ABNORMAL LOW (ref >60)
Glucose, Bld: 194 mg/dL — ABNORMAL HIGH (ref 70–99)
Potassium: 5.1 mmol/L (ref 3.5–5.1)
Sodium: 137 mmol/L (ref 135–145)
Total Bilirubin: 0.4 mg/dL (ref 0.0–1.2)
Total Protein: 6 g/dL — ABNORMAL LOW (ref 6.5–8.1)

## 2024-02-07 LAB — GLUCOSE, CAPILLARY
Glucose-Capillary: 268 mg/dL — ABNORMAL HIGH (ref 70–99)
Glucose-Capillary: 127 mg/dL — ABNORMAL HIGH (ref 70–99)

## 2024-02-07 LAB — MAGNESIUM: Magnesium: 2 mg/dL (ref 1.7–2.4)

## 2024-02-07 LAB — PHOSPHORUS: Phosphorus: 3.7 mg/dL (ref 2.5–4.6)

## 2024-02-07 MED ORDER — ADULT MULTIVITAMIN W/MINERALS CH: 1.0000 | ORAL_TABLET | Freq: Every day | ORAL | 0 refills | Status: AC

## 2024-02-07 MED ORDER — BENZONATATE 200 MG PO CAPS: 200.0000 mg | ORAL_CAPSULE | Freq: Three times a day (TID) | ORAL | 0 refills | Status: DC | PRN

## 2024-02-07 MED ORDER — ENSURE ENLIVE PO LIQD: 237.0000 mL | Freq: Two times a day (BID) | ORAL | 12 refills | Status: DC

## 2024-02-07 MED ORDER — OXYCODONE HCL 5 MG PO TABS: 5.0000 mg | ORAL_TABLET | Freq: Four times a day (QID) | ORAL | 0 refills | Status: DC | PRN

## 2024-02-07 MED ORDER — BLOOD GLUCOSE TEST VI STRP: 1.0000 | ORAL_STRIP | Freq: Three times a day (TID) | 0 refills | Status: AC

## 2024-02-07 MED ORDER — ACETAMINOPHEN 325 MG PO TABS: 650.0000 mg | ORAL_TABLET | Freq: Four times a day (QID) | ORAL | 0 refills | Status: AC | PRN

## 2024-02-07 MED ORDER — INSULIN GLARGINE 100 UNIT/ML SOLOSTAR PEN: 10.0000 [IU] | PEN_INJECTOR | Freq: Every day | SUBCUTANEOUS | 0 refills | Status: DC

## 2024-02-07 MED ORDER — LANCETS MISC
1.0000 | Freq: Three times a day (TID) | 0 refills | Status: AC
Start: 2024-02-07 — End: ?

## 2024-02-07 MED ORDER — LANCET DEVICE MISC
1.0000 | Freq: Three times a day (TID) | 0 refills | Status: AC
Start: 2024-02-07 — End: ?

## 2024-02-07 MED ORDER — GUAIFENESIN ER 600 MG PO TB12: 600.0000 mg | ORAL_TABLET | Freq: Two times a day (BID) | ORAL | 0 refills | Status: AC

## 2024-02-07 MED ORDER — CEFDINIR 300 MG PO CAPS: 300.0000 mg | ORAL_CAPSULE | Freq: Two times a day (BID) | ORAL | 0 refills | Status: AC

## 2024-02-07 MED ORDER — PEN NEEDLES 31G X 5 MM MISC: 1.0000 | Freq: Three times a day (TID) | 0 refills | Status: DC

## 2024-02-07 MED ORDER — BLOOD GLUCOSE MONITORING SUPPL DEVI: 1.0000 | Freq: Three times a day (TID) | 0 refills | Status: AC

## 2024-02-07 MED ORDER — AZITHROMYCIN 500 MG PO TABS: 500.0000 mg | ORAL_TABLET | Freq: Every day | ORAL | 0 refills | Status: AC

## 2024-02-07 NOTE — Plan of Care (Signed)

## 2024-02-07 NOTE — Discharge Summary (Signed)
 Physician Discharge Summary   Patient: Virginia [name] MRN: [id number] DOB: 01/07/1976  Admit date:     02/03/2024  Discharge date: 02/07/24  Discharge Physician: Marguerita Merles, DO   PCP: Pcp, No   Recommendations at discharge:   Follow-up with PCP within 1 to 2 weeks and repeat CBC, CMP, mag, Phos within 1 week Repeat chest x-ray in 3 to 6 weeks  Discharge Diagnoses: Principal Problem:   Generalized weakness Active Problems:   DM2 (diabetes mellitus, type 2) (HCC)   Dehydration   Hypomagnesemia   Acute renal failure superimposed on stage 3b chronic kidney disease (HCC)   Allergic rhinitis   Influenza A   Syncope   Acute cystitis   GAD (generalized anxiety disorder)   Anemia of chronic disease  Resolved Problems:   * No resolved hospital problems. *  Hospital Course: Patient is an obese African-American female with a past medical history significant for but not limited to to diabetes mellitus type 2, generalized anxiety disorder, CKD stage IIIb with a baseline creatinine 1.3-1.7, anemia of chronic disease, as well as other comorbidities who presented generalized weakness fatigue and was found to be influenza A positive as well as a UTI.  He has been having multiple loose stools as well and complaining of some back pain and abdominal discomfort with burning and dysuria.  She also experienced a single loss of episode of consciousness when she attempted to rise from a seated to standing position.  She is much improved and stable for discharge at this time will need to follow-up with PCP and repeat chest x-ray in 3 to 6 weeks.  Assessment and Plan:  Generalized Weakness and Fatigue, improving -Has a 3 to 4-day duration of generalized weakness, in the absence of any evidence of acute focal neurologic deficits, including no evidence of acute focal weakness to suggest acute CVA.   -Suspect multifactorial contributions, including contribution from physiologic stress stemming from  presenting influenza A infection as well as resultant dehydration, potential and urinary tract infection.  -Further workup also reveals that she has a influenza pneumonia -Will initiate azithromycin in addition to the ceftriaxone that she is already getting for UTI -IV fluid hydration as below -TSH was 1.231, B12 was 360, CK was 37 -PT OT recommending no follow-up and repeat orthostatics showed that she did not drop  Influenza A with pneumonia, stable -Has a hx of coughing and states its started a few weeks ago go but got worse the last 3-4 days with rhinitis, rhinorrhea, subjective fever, myalgias -CXR done and showed "No active cardiopulmonary disease" however CT scan showed patchy airspace disease worse on the left compared to right -Outside the window for initiation of Oseltamivir -WBC is not elevated at all -Repeat Chest x-ray within 3 to 6 weeks at discharge -Will cover for superimposed bacterial infection and add azithromycin in addition to the ceftriaxone that she is getting for a UTI.  Changed to oral azithromycin and cefdinir for discharge -Did not desaturate  Dehydration, improved  -On presentation she had dry oral mucous membranes as well as an elevated specific gravity on her UA and presence of hyaline casts -Has been having GI losses with increased diarrhea and decreased oral intake. -She is provide is post IV fluid boluses and maintenance IV fluid and is able to take in orally but will address nausea as below  Syncope, improved -Likely from orthostatic hypotension in the setting of her GI losses and poor p.o. intake. -Continue telemetry monitoring EKG showed no  evidence of acute ischemic changes -IV hydration was stopped today and will check PT OT and repeat orthostatics in the AM if she is not orthostatic  Acute E Coli UTI Cystitis vs Pyelonephritis -Patient having Right Sided Flank Pain with urinating and continues to have urinary urgency -Urinalysis done and showed a  cloudy appearance with amber color urine, 50 glucose, small leukocytes, negative nitrites, greater than 300 protein, many bacteria, 6-10 RBCs per high-powered field, 21-50 WBCs and urine cultures showing >100,000 CFU of E Coli with that was pansensitive except only resistant to ampicillin and ampicillin sulbactam -Was going to obtain a CT renal stone study however given her recent renal dysfunction we will hold off and obtain a CT scan of the abdomen pelvis without contrast -Continue with Empiric IV Ceftriaxone for now follow urine culture and adjust Abx as Necessary and changed to oral azithromycin and oral ceftriaxone for discharge  Diarrhea and Loose Stools, improving Abdominal Pain, improving Nausea, persistent -Likely in the setting of influenza but will check C. difficile and GI pathogen panel -Diarrhea has resolved but patient continued to complain of some abdominal pain and nausea so obtained a CT Scan Abd/Pelvis which showed no real findings in the abdomen and pelvis but did show a patchy airspace opacities in bilateral lobes with the left being worse than the right, concerning for infection as well as trace bilateral effusions and small amount of air in the bladder -Continue supportive care and will change to Zofran that the patient has to IV Compazine every 6 given ineffectiveness of Zofran.  Her nausea vomiting improved and we will send her on p.o. Zofran and pain medications  Diabetes Mellitus Type 2:-HbA1c was 11.0 in June 2024 and repeat here is 9.7 -Longer taking outpatient Levemir but while she is hospitalized we will C/w Sensitive Novolog SSI AC/HS and added a carb modified diet.  Glucose levels are now improving and CBG ranges from 75-205 -Diabetes education coordinator consulted and recommending 8 units of Semglee daily; his diabetes education coordinator now provided the patient a freestyle libre and we will make further medication adjustments at discharge and we will send her out  on 10 units of Lantus  AKI on CKD Stage 3b Metabolic Acidosis -Baseline Cr around 1.3-1.7; BUN/Cr Trend improved with IV fluid resuscitation and BUN/creatinine is now 18/1.23 at the time of discharge -IV fluid hydration has now been stopped -Has a Slight Metabolic Acidosis with a CO2 of 21, AG of 7, and Chloride Level of 108 -Avoid Nephrotoxic Medications, Contrast Dyes, Hypotension and Dehydration to Ensure Adequate Renal Perfusion and will need to Renally Adjust Meds -Continue to Monitor and Trend Renal Function carefully and repeat CMP within 1 week  Generalized Anxiety Disorder: C/w Fluoxetine 40 mg po Daily and Alprazolam 1 mg po TIDprn Anxiety   Hypomagnesemia: Patient's Mag improved to 2.0 at the time of discharge. Continue to Monitor and Replete as Necessary and Repeat Mag  w/in 1 week  Normocytic Anemia: Baseline Hgb ranging from 10-12. Hgb/Hct Trend dropping since admission likely in the setting of dilution of IV fluid resuscitation but relatively stable now.  Last H/H was 9.4/28.9 -Checked Anemia Panel showed an iron level of 40, UIBC of 125, TIBC of 165, saturation ratios of 24%, ferritin of 73, folate level 8.6 -CTM for S/Sx of Bleeding; No overt bleeding noted; Repeat CBC w/in 1 week  Allergic Rhinitis: C/w Home Fluticasone 50 mcg per actuation nasal spray 1 spray each nare daily nightly  Hypoalbuminemia: Albumin Trend ranging  from 1.8-2.0. Continue to Monitor and Trend and repeat CMP in the AM  Class I Obesity -Complicates overall prognosis and care -Estimated body mass index is 34.49 kg/m as calculated from the following:   Height as of this encounter: 5\' 1"  (1.549 m).   Weight as of this encounter: 82.8 kg.  -Weight Loss and Dietary Counseling given  Nutrition Documentation    Flowsheet Row ED to Hosp-Admission (Discharged) from 02/03/2024 in [facility]  Nutrition Problem Inadequate oral intake  Etiology nausea  Nutrition Goal Patient will meet  greater than or equal to 90% of their needs  Interventions Ensure Enlive (each supplement provides 350kcal and 20 grams of protein), MVI, Magic cup      Consultants: None Procedures performed: Delineated as above Disposition: Home Diet recommendation:  Cardiac and Carb modified diet DISCHARGE MEDICATION: Allergies as of 02/07/2024       Reactions   Ace Inhibitors Nausea And Vomiting, Cough   Banana Nausea And Vomiting   Lamictal [lamotrigine] Nausea And Vomiting   Morphine Itching   Nsaids Other (See Comments)   Kidney failure   Topiramate Nausea And Vomiting        Medication List     STOP taking these medications    GOODY HEADACHE PO   insulin detemir 100 UNIT/ML FlexPen Commonly known as: LEVEMIR       TAKE these medications    acetaminophen 325 MG tablet Commonly known as: TYLENOL Take 2 tablets (650 mg total) by mouth every 6 (six) hours as needed for mild pain (pain score 1-3) (or Fever >/= 101). What changed:  medication strength how much to take when to take this reasons to take this   albuterol 108 (90 Base) MCG/ACT inhaler Commonly known as: VENTOLIN HFA Inhale 2 puffs every 4 (four) hours as needed into the lungs for wheezing or shortness of breath.   ALPRAZolam 1 MG tablet Commonly known as: XANAX Take 1 mg by mouth 3 (three) times daily as needed for anxiety.   azithromycin 500 MG tablet Commonly known as: ZITHROMAX Take 1 tablet (500 mg total) by mouth daily for 3 days. Start taking on: February 08, 2024   benzonatate 200 MG capsule Commonly known as: TESSALON Take 1 capsule (200 mg total) by mouth 3 (three) times daily as needed for cough.   Blood Glucose Monitoring Suppl Devi 1 each by Does not apply route 3 (three) times daily. May dispense any manufacturer covered by patient's insurance.   BLOOD GLUCOSE TEST STRIPS Strp 1 each by Does not apply route 3 (three) times daily. Use as directed to check blood sugar. May dispense any  manufacturer covered by patient's insurance and fits patient's device.   cefdinir 300 MG capsule Commonly known as: OMNICEF Take 1 capsule (300 mg total) by mouth 2 (two) times daily for 3 days.   feeding supplement Liqd Take 237 mLs by mouth 2 (two) times daily between meals.   FLUoxetine 40 MG capsule Commonly known as: PROZAC Take 40 mg by mouth daily.   fluticasone 50 MCG/ACT nasal spray Commonly known as: FLONASE Place 1 spray into both nostrils at bedtime.   FreeStyle Libre 3 Sensor Misc Place 1 sensor on the skin every 14 days. Use to check glucose continuously   guaiFENesin 600 MG 12 hr tablet Commonly known as: MUCINEX Take 1 tablet (600 mg total) by mouth 2 (two) times daily for 5 days.   insulin glargine 100 UNIT/ML Solostar Pen Commonly known  as: LANTUS Inject 10 Units into the skin daily.   Lancet Device Misc 1 each by Does not apply route 3 (three) times daily. May dispense any manufacturer covered by patient's insurance.   Lancets Misc 1 each by Does not apply route 3 (three) times daily. Use as directed to check blood sugar. May dispense any manufacturer covered by patient's insurance and fits patient's device.   multivitamin with minerals Tabs tablet Take 1 tablet by mouth daily. Start taking on: February 08, 2024   oxyCODONE 5 MG immediate release tablet Commonly known as: Roxicodone Take 1 tablet (5 mg total) by mouth every 6 (six) hours as needed for severe pain (pain score 7-10).   Pen Needles 31G X 5 MM Misc 1 each by Does not apply route 3 (three) times daily. May dispense any manufacturer covered by patient's insurance.        Discharge Exam: Filed Weights   02/06/24 0411 02/07/24 0500 02/07/24 0522  Weight: 83.1 kg 84 kg 82.8 kg   Vitals:   02/07/24 0743 02/07/24 1119  BP: (!) 174/80 (!) 152/84  Pulse: 87 88  Resp: 18 18  Temp: 98.2 F (36.8 C) 97.7 F (36.5 C)  SpO2: 100% 100%   Examination: Physical Exam:  Constitutional:  WN/WD obese African-American female in no acute distress Respiratory: Diminished to auscultation bilaterally with some coarse breath sounds, no wheezing, rales, rhonchi or crackles. Normal respiratory effort and patient is not tachypenic. No accessory muscle use.  Unlabored breathing Cardiovascular: RRR, no murmurs / rubs / gallops. S1 and S2 auscultated. No extremity edema.  Abdomen: Soft, mildly-tender, distended secondary to body habitus. Bowel sounds positive.  GU: Deferred. Musculoskeletal: No clubbing / cyanosis of digits/nails. No joint deformity upper and lower extremities.  Skin: No rashes, lesions, ulcers on limited skin evaluation. No induration; Warm and dry.  Neurologic: CN 2-12 grossly intact with no focal deficits. Romberg sign and cerebellar reflexes not assessed.  Psychiatric: Normal judgment and insight. Alert and oriented x 3. Normal mood and appropriate affect.   Condition at discharge: stable  The results of significant diagnostics from this hospitalization (including imaging, microbiology, ancillary and laboratory) are listed below for reference.   Imaging Studies: CT ABDOMEN PELVIS WO CONTRAST Result Date: 02/06/2024 CLINICAL DATA:  Right-sided pain EXAM: CT ABDOMEN AND PELVIS WITHOUT CONTRAST TECHNIQUE: Multidetector CT imaging of the abdomen and pelvis was performed following the standard protocol without IV contrast. RADIATION DOSE REDUCTION: This exam was performed according to the departmental dose-optimization program which includes automated exposure control, adjustment of the mA and/or kV according to patient size and/or use of iterative reconstruction technique. COMPARISON:  CT angiogram chest abdomen and pelvis 05/23/2023 FINDINGS: Lower chest: Patchy airspace opacities are seen in the bilateral lower lobes, left greater than right, worrisome for infection. There are trace bilateral pleural effusions. Hepatobiliary: No focal liver abnormality is seen. Status post  cholecystectomy. No biliary dilatation. Pancreas: Unremarkable. No pancreatic ductal dilatation or surrounding inflammatory changes. Spleen: Normal in size without focal abnormality. Adrenals/Urinary Tract: There is a small amount of air in the bladder. The bladder is otherwise within normal limits. The adrenal glands and kidneys appear within normal limits. Stomach/Bowel: There are postsurgical changes in the stomach. Appendix appears normal. No evidence of bowel wall thickening, distention, or inflammatory changes. Vascular/Lymphatic: No significant vascular findings are present. No enlarged abdominal or pelvic lymph nodes. Reproductive: Uterus and bilateral adnexa are unremarkable. Other: No abdominal wall hernia or abnormality. No abdominopelvic ascites. Musculoskeletal: No acute  or significant osseous findings. IMPRESSION: 1. Patchy airspace opacities in the bilateral lower lobes, left greater than right, worrisome for infection. 2. Trace bilateral pleural effusions. 3. Small amount of air in the bladder, possibly related to recent instrumentation. Correlate clinically. Electronically Signed   By: Darliss Cheney M.D.   On: 02/06/2024 01:46   DG CHEST PORT 1 VIEW Result Date: 02/05/2024 CLINICAL DATA:  [id number] Influenza A [id number] EXAM: PORTABLE CHEST 1 VIEW COMPARISON:  02/03/2024 FINDINGS: The heart size and mediastinal contours are within normal limits. Small band-like opacity at the left lung base. Right lung is clear. No pleural effusion or pneumothorax. The visualized skeletal structures are unremarkable. IMPRESSION: Small band-like opacity at the left lung base, favor atelectasis. Electronically Signed   By: Duanne Guess D.O.   On: 02/05/2024 17:01   DG Chest 2 View Result Date: 02/03/2024 CLINICAL DATA:  Cough and congestion.  Headache. EXAM: CHEST - 2 VIEW COMPARISON:  05/22/2023. FINDINGS: Bilateral lung fields are clear. Bilateral costophrenic angles are clear. Normal cardio-mediastinal  silhouette. No acute osseous abnormalities. The soft tissues are within normal limits. IMPRESSION: No active cardiopulmonary disease. Electronically Signed   By: Jules Schick M.D.   On: 02/03/2024 15:43   Microbiology: Results for orders placed or performed during the hospital encounter of 02/03/24  Resp panel by RT-PCR (RSV, Flu A&B, Covid) Anterior Nasal Swab     Status: Abnormal   Collection Time: 02/03/24 12:09 PM   Specimen: Anterior Nasal Swab  Result Value Ref Range Status   SARS Coronavirus 2 by RT PCR NEGATIVE NEGATIVE Final   Influenza A by PCR POSITIVE (A) NEGATIVE Final   Influenza B by PCR NEGATIVE NEGATIVE Final    Comment: (NOTE) The Xpert Xpress SARS-CoV-2/FLU/RSV plus assay is intended as an aid in the diagnosis of influenza from Nasopharyngeal swab specimens and should not be used as a sole basis for treatment. Nasal washings and aspirates are unacceptable for Xpert Xpress SARS-CoV-2/FLU/RSV testing.  Fact Sheet for Patients: BloggerCourse.com  Fact Sheet for Healthcare Providers: SeriousBroker.it  This test is not yet approved or cleared by the [place] FDA and has been authorized for detection and/or diagnosis of SARS-CoV-2 by FDA under an Emergency Use Authorization (EUA). This EUA will remain in effect (meaning this test can be used) for the duration of the COVID-19 declaration under Section 564(b)(1) of the Act, 21 U.S.C. section 360bbb-3(b)(1), unless the authorization is terminated or revoked.     Resp Syncytial Virus by PCR NEGATIVE NEGATIVE Final    Comment: (NOTE) Fact Sheet for Patients: BloggerCourse.com  Fact Sheet for Healthcare Providers: SeriousBroker.it  This test is not yet approved or cleared by the [place] FDA and has been authorized for detection and/or diagnosis of SARS-CoV-2 by FDA under an Emergency Use Authorization (EUA).  This EUA will remain in effect (meaning this test can be used) for the duration of the COVID-19 declaration under Section 564(b)(1) of the Act, 21 U.S.C. section 360bbb-3(b)(1), unless the authorization is terminated or revoked.  Performed at [facility], [address], [place], [place] [address]   Culture, [place] Urine     Status: Abnormal   Collection Time: 02/03/24  9:46 PM   Specimen: Urine, Random  Result Value Ref Range Status   Specimen Description URINE, RANDOM  Final   Special Requests NONE  Final   Culture (A)  Final    >=100,000 COLONIES/mL ESCHERICHIA COLI NO GROUP B STREP (S.AGALACTIAE) ISOLATED Performed at [facility], [address], [place], [place] [address]    Report Status 02/06/2024 FINAL  Final   Organism ID, Bacteria ESCHERICHIA COLI (A)  Final      Susceptibility   Escherichia coli - MIC*    AMPICILLIN >=32 RESISTANT Resistant     CEFEPIME <=0.12 SENSITIVE Sensitive     CEFTRIAXONE <=0.25 SENSITIVE Sensitive     CIPROFLOXACIN <=0.25 SENSITIVE Sensitive     GENTAMICIN <=1 SENSITIVE Sensitive     IMIPENEM <=0.25 SENSITIVE Sensitive     NITROFURANTOIN <=16 SENSITIVE Sensitive     TRIMETH/SULFA <=20 SENSITIVE Sensitive     AMPICILLIN/SULBACTAM >=32 RESISTANT Resistant     PIP/TAZO <=4 SENSITIVE Sensitive ug/mL    * >=100,000 COLONIES/mL ESCHERICHIA COLI   Labs: CBC: Recent Labs  Lab 02/03/24 1217 02/04/24 0711 02/05/24 0741 02/06/24 0750 02/07/24 0623  WBC 9.9 9.0 8.0 8.3 8.2  NEUTROABS  --  4.1 4.1 4.6 4.7  HGB 10.7* 10.0* 9.5* 9.4* 9.4*  HCT 34.2* 31.1* 29.9* 28.9* 30.1*  MCV 87.2 85.4 86.7 87.3 88.8  PLT 383 366 335 350 350   Basic Metabolic Panel: Recent Labs  Lab 02/03/24 1217 02/03/24 1552 02/04/24 0711 02/05/24 0741 02/06/24 0750 02/07/24 0623  NA 141  --  140 138 136 137  K 4.4  --  4.1 4.2 4.6 5.1  CL 109  --  110 109 108 104  CO2 22  --  21* 21* 21* 23  GLUCOSE 271*  --  136* 175* 156* 194*  BUN 19  --  14 14  15 18   CREATININE 2.10*  --  1.62* 1.42* 1.35* 1.23*  CALCIUM 8.3*  --  7.9* 7.8* 7.6* 7.8*  MG  --  1.5* 1.5* 1.2* 1.8 2.0  PHOS  --   --  3.5 3.1 3.6 3.7   Liver Function Tests: Recent Labs  Lab 02/04/24 0711 02/05/24 0741 02/06/24 0750 02/07/24 0623  AST 13* 13* 13* 14*  ALT 29 21 17 15   ALKPHOS 129* 130* 108 120  BILITOT 0.4 0.4 0.4 0.4  PROT 6.3* 5.9* 5.7* 6.0*  ALBUMIN 2.0* 2.0* 1.8* 1.9*   CBG: Recent Labs  Lab 02/06/24 1200 02/06/24 1654 02/06/24 2019 02/07/24 0746 02/07/24 1122  GLUCAP 178* 111* 162* 268* 127*   Discharge time spent: greater than 30 minutes.  Signed: Marguerita Merles, DO [facility] 02/07/2024

## 2024-02-07 NOTE — Inpatient Diabetes Management (Signed)
 Inpatient Diabetes Program Recommendations  AACE/ADA: New Consensus Statement on Inpatient Glycemic Control (2015)  Target Ranges:  Prepandial:   less than 140 mg/dL      Peak postprandial:   less than 180 mg/dL (1-2 hours)      Critically ill patients:  140 - 180 mg/dL   Lab Results  Component Value Date   GLUCAP 268 (H) 02/07/2024   HGBA1C 9.7 (H) 02/03/2024    Discharge Recommendations: Other recommendations: Patient needs refill for [facility] - Order number [id number] Long acting recommendations: Insulin Glargine (LANTUS) Solostar Pen 10 units daily  Supply/Referral recommendations: Glucometer Test strips Lancet device Lancets Pen needles - standard   Use Adult Diabetes Insulin Treatment Post Discharge order set.  Thank you, Billy Fischer. Jnae Thomaston, RN, MSN, CDCES  Diabetes Coordinator Inpatient Glycemic Control Team Team Pager [phone] (8am-5pm) 02/07/2024 10:36 AM

## 2024-02-21 ENCOUNTER — Other Ambulatory Visit: Payer: Self-pay

## 2024-02-21 ENCOUNTER — Emergency Department (HOSPITAL_COMMUNITY)

## 2024-02-21 ENCOUNTER — Emergency Department (HOSPITAL_COMMUNITY)
Admission: EM | Admit: 2024-02-21 | Discharge: 2024-02-21 | Disposition: A | Discharge status: Home / Self Care | Attending: Emergency Medicine | Admitting: Emergency Medicine

## 2024-02-21 ENCOUNTER — Encounter (HOSPITAL_COMMUNITY): Payer: Self-pay

## 2024-02-21 DIAGNOSIS — R1013 Epigastric pain: Secondary | ICD-10-CM | POA: Diagnosis not present

## 2024-02-21 DIAGNOSIS — Z794 Long term (current) use of insulin: Secondary | ICD-10-CM | POA: Diagnosis not present

## 2024-02-21 DIAGNOSIS — Z79899 Other long term (current) drug therapy: Secondary | ICD-10-CM | POA: Insufficient documentation

## 2024-02-21 DIAGNOSIS — R1010 Upper abdominal pain, unspecified: Secondary | ICD-10-CM | POA: Diagnosis present

## 2024-02-21 LAB — COMPREHENSIVE METABOLIC PANEL
ALT: 22 U/L (ref 0–44)
AST: 18 U/L (ref 15–41)
Albumin: 3.4 g/dL — ABNORMAL LOW (ref 3.5–5.0)
Alkaline Phosphatase: 148 U/L — ABNORMAL HIGH (ref 38–126)
Anion gap: 12 (ref 5–15)
BUN: 18 mg/dL (ref 6–20)
CO2: 19 mmol/L — ABNORMAL LOW (ref 22–32)
Calcium: 8.2 mg/dL — ABNORMAL LOW (ref 8.9–10.3)
Chloride: 107 mmol/L (ref 98–111)
Creatinine, Ser: 1.29 mg/dL — ABNORMAL HIGH (ref 0.44–1.00)
GFR, Estimated: 52 mL/min — ABNORMAL LOW (ref >60)
Glucose, Bld: 209 mg/dL — ABNORMAL HIGH (ref 70–99)
Potassium: 4.1 mmol/L (ref 3.5–5.1)
Sodium: 138 mmol/L (ref 135–145)
Total Bilirubin: 0.8 mg/dL (ref 0.0–1.2)
Total Protein: 7.9 g/dL (ref 6.5–8.1)

## 2024-02-21 LAB — CBC WITH DIFFERENTIAL/PLATELET
Abs Immature Granulocytes: 0.02 10*3/uL (ref 0.00–0.07)
Basophils Absolute: 0 10*3/uL (ref 0.0–0.1)
Basophils Relative: 0 %
Eosinophils Absolute: 0.1 10*3/uL (ref 0.0–0.5)
Eosinophils Relative: 1 %
HCT: 36.2 % (ref 36.0–46.0)
Hemoglobin: 11.6 g/dL — ABNORMAL LOW (ref 12.0–15.0)
Immature Granulocytes: 0 %
Lymphocytes Relative: 23 %
Lymphs Abs: 2.2 10*3/uL (ref 0.7–4.0)
MCH: 28.3 pg (ref 26.0–34.0)
MCHC: 32 g/dL (ref 30.0–36.0)
MCV: 88.3 fL (ref 80.0–100.0)
Monocytes Absolute: 0.5 10*3/uL (ref 0.1–1.0)
Monocytes Relative: 5 %
Neutro Abs: 6.8 10*3/uL (ref 1.7–7.7)
Neutrophils Relative %: 71 %
Platelets: 302 10*3/uL (ref 150–400)
RBC: 4.1 MIL/uL (ref 3.87–5.11)
RDW: 13.8 % (ref 11.5–15.5)
WBC: 9.6 10*3/uL (ref 4.0–10.5)
nRBC: 0 % (ref 0.0–0.2)

## 2024-02-21 LAB — LIPASE, BLOOD: Lipase: 24 U/L (ref 11–51)

## 2024-02-21 LAB — HCG, QUANTITATIVE, PREGNANCY: hCG, Beta Chain, Quant, S: <1 m[IU]/mL (ref <5)

## 2024-02-21 MED ORDER — HYDROMORPHONE HCL 1 MG/ML IJ SOLN
1.0000 mg | Freq: Once | INTRAMUSCULAR | Status: AC
  Administered 2024-02-21: 1 mg via INTRAVENOUS
  Filled 2024-02-21: qty 1

## 2024-02-21 MED ORDER — LACTATED RINGERS IV BOLUS
1000.0000 mL | Freq: Once | INTRAVENOUS | Status: AC
  Administered 2024-02-21: 1000 mL via INTRAVENOUS

## 2024-02-21 MED ORDER — METOCLOPRAMIDE HCL 5 MG/ML IJ SOLN
10.0000 mg | Freq: Once | INTRAMUSCULAR | Status: AC
  Administered 2024-02-21: 10 mg via INTRAVENOUS
  Filled 2024-02-21: qty 2

## 2024-02-21 NOTE — Discharge Instructions (Signed)
 Follow-up with your primary care physician in 2 days as scheduled.  If you develop new or worsening or concerning symptoms and return to the ER.

## 2024-02-21 NOTE — ED Triage Notes (Signed)
 Pt BIB EMS with a stomach ache for 4 hrs with n/v. Im zofran 4 mg given by ems.

## 2024-02-21 NOTE — ED Provider Notes (Signed)
 [place] [facility] AT [facility] Provider Note   CSN: [id number] Arrival date & time: 02/21/24  1519     History  Chief Complaint  Patient presents with   Abdominal Pain    Virginia [name] is a 48 y.o. female.  HPI 48 year old female presents with abdominal pain and vomiting.  Patient has had symptoms since this morning.  The abdominal pain is primarily epigastric and feels like a severe "tummy ache".  No back pain, chest pain.  No hematemesis or diarrhea.  Patient has had a prolonged cough since having influenza couple weeks ago and continued shortness of breath but no new shortness of breath or cough or fever.  Home Medications Prior to Admission medications   Medication Sig Start Date End Date Taking? Authorizing Provider  acetaminophen (TYLENOL) 325 MG tablet Take 2 tablets (650 mg total) by mouth every 6 (six) hours as needed for mild pain (pain score 1-3) (or Fever >/= 101). 02/07/24   Sheikh, Omair Latif, DO  albuterol (VENTOLIN HFA) 108 (90 Base) MCG/ACT inhaler Inhale 2 puffs every 4 (four) hours as needed into the lungs for wheezing or shortness of breath.    [provider]  ALPRAZolam Prudy Feeler) 1 MG tablet Take 1 mg by mouth 3 (three) times daily as needed for anxiety. 02/26/20   [provider]  benzonatate (TESSALON) 200 MG capsule Take 1 capsule (200 mg total) by mouth 3 (three) times daily as needed for cough. 02/07/24   Marguerita Merles Latif, DO  Blood Glucose Monitoring Suppl DEVI 1 each by Does not apply route 3 (three) times daily. May dispense any manufacturer covered by patient's insurance. 02/07/24   Marguerita Merles Latif, DO  Continuous Glucose Sensor (FREESTYLE LIBRE 3 SENSOR) MISC Place 1 sensor on the skin every 14 days. Use to check glucose continuously 05/25/23   Lurene Shadow, MD  feeding supplement (ENSURE ENLIVE / ENSURE PLUS) LIQD Take 237 mLs by mouth 2 (two) times daily between meals. 02/07/24   Marguerita Merles Latif,  DO  FLUoxetine (PROZAC) 40 MG capsule Take 40 mg by mouth daily. 12/21/19   [provider]  fluticasone (FLONASE) 50 MCG/ACT nasal spray Place 1 spray into both nostrils at bedtime. 05/25/23 02/03/24  Lurene Shadow, MD  Glucose Blood (BLOOD GLUCOSE TEST STRIPS) STRP 1 each by Does not apply route 3 (three) times daily. Use as directed to check blood sugar. May dispense any manufacturer covered by patient's insurance and fits patient's device. 02/07/24   Marguerita Merles Latif, DO  insulin glargine (LANTUS) 100 UNIT/ML Solostar Pen Inject 10 Units into the skin daily. 02/07/24   Marguerita Merles Latif, DO  Insulin Pen Needle (PEN NEEDLES) 31G X 5 MM MISC 1 each by Does not apply route 3 (three) times daily. May dispense any manufacturer covered by patient's insurance. 02/07/24   Merlene Laughter, DO  Lancet Device MISC 1 each by Does not apply route 3 (three) times daily. May dispense any manufacturer covered by patient's insurance. 02/07/24   Marguerita Merles Latif, DO  Lancets MISC 1 each by Does not apply route 3 (three) times daily. Use as directed to check blood sugar. May dispense any manufacturer covered by patient's insurance and fits patient's device. 02/07/24   Marguerita Merles Latif, DO  Multiple Vitamin (MULTIVITAMIN WITH MINERALS) TABS tablet Take 1 tablet by mouth daily. 02/08/24   Marguerita Merles Latif, DO  oxyCODONE (ROXICODONE) 5 MG immediate release tablet Take 1 tablet (5 mg total) by mouth  every 6 (six) hours as needed for severe pain (pain score 7-10). 02/07/24   Marguerita Merles Latif, DO      Allergies    Ace inhibitors, Banana, Lamictal [lamotrigine], Morphine, Nsaids, and Topiramate    Review of Systems   Review of Systems  Cardiovascular:  Negative for chest pain.  Gastrointestinal:  Positive for abdominal pain, nausea and vomiting. Negative for diarrhea.  Musculoskeletal:  Negative for back pain.    Physical Exam Updated Vital Signs BP (!) 165/94 (BP Location: Left Arm)   Pulse  84   Temp 97.9 F (36.6 C) (Oral)   Resp 18   Ht 5\' 1"  (1.549 m)   Wt 77.1 kg   LMP 01/04/2024 (Approximate)   SpO2 100%   BMI 32.12 kg/m  Physical Exam Vitals and nursing note reviewed.  Constitutional:      Appearance: She is well-developed.  HENT:     Head: Normocephalic and atraumatic.  Cardiovascular:     Rate and Rhythm: Normal rate and regular rhythm.     Heart sounds: Normal heart sounds.  Pulmonary:     Effort: Pulmonary effort is normal.     Breath sounds: Normal breath sounds.  Abdominal:     Palpations: Abdomen is soft.     Tenderness: There is abdominal tenderness in the right upper quadrant, epigastric area and left upper quadrant.  Skin:    General: Skin is warm and dry.  Neurological:     Mental Status: She is alert.     ED Results / Procedures / Treatments   Labs (all labs ordered are listed, but only abnormal results are displayed) Labs Reviewed  COMPREHENSIVE METABOLIC PANEL - Abnormal; Notable for the following components:      Result Value   CO2 19 (*)    Glucose, Bld 209 (*)    Creatinine, Ser 1.29 (*)    Calcium 8.2 (*)    Albumin 3.4 (*)    Alkaline Phosphatase 148 (*)    GFR, Estimated 52 (*)    All other components within normal limits  CBC WITH DIFFERENTIAL/PLATELET - Abnormal; Notable for the following components:   Hemoglobin 11.6 (*)    All other components within normal limits  LIPASE, BLOOD  HCG, QUANTITATIVE, PREGNANCY    EKG EKG Interpretation Date/Time:  Tuesday February 21 2024 16:52:00 EDT Ventricular Rate:  80 PR Interval:  120 QRS Duration:  135 QT Interval:  457 QTC Calculation: 528 R Axis:   201  Text Interpretation: Sinus rhythm Probable left atrial enlargement RBBB and LPFB Baseline wander in lead(s) V4 Confirmed by Pricilla Loveless [phone]) on 02/21/2024 5:04:53 PM  Radiology DG Chest Portable 1 View Result Date: 02/21/2024 CLINICAL DATA:  dyspnea, abdominal pain EXAM: PORTABLE CHEST 1 VIEW COMPARISON:  Chest  x-ray 02/05/2024, CT chest 05/23/2023 FINDINGS: The heart and mediastinal contours are unchanged. No focal consolidation. No pulmonary edema. No pleural effusion. No pneumothorax. No acute osseous abnormality. IMPRESSION: No active disease. Electronically Signed   By: Tish Frederickson M.D.   On: 02/21/2024 19:32    Procedures Procedures    Medications Ordered in ED Medications  HYDROmorphone (DILAUDID) injection 1 mg (1 mg Intravenous Given 02/21/24 1642)  metoCLOPramide (REGLAN) injection 10 mg (10 mg Intravenous Given 02/21/24 1643)  lactated ringers bolus 1,000 mL (0 mLs Intravenous Stopped 02/21/24 1930)    ED Course/ Medical Decision Making/ A&P  Medical Decision Making Amount and/or Complexity of Data Reviewed Labs: ordered.    Details: No AKI Radiology: ordered and independent interpretation performed.    Details: No pneumonia ECG/medicine tests: ordered and independent interpretation performed.    Details: No ischemia  Risk Prescription drug management.   Patient tells me she has had a previous history of gastroparesis.  She was given Reglan, Dilaudid, and fluid bolus and now is feeling a lot better.  She is completely asymptomatic.  Repeat abdominal exam shows no tenderness.  She is tolerating p.o. and feels well enough for discharge.  Given no abdominal tenderness or any symptoms after treatment, I do not think repeat imaging will be needed.  She has had multiple CTs of her abdomen and the pelvis before that have not been particularly enlightening and so I do not think a repeat scan now will help.  Doubt obstruction, acute emergent surgical condition, etc.  She has Zofran at home.  She feels well now for discharge and has no urinary symptoms.  Has a PCP follow-up in 2 days already.  Encouraged to keep this and return if symptoms worsen.        Final Clinical Impression(s) / ED Diagnoses Final diagnoses:  Upper abdominal pain    Rx / DC  Orders ED Discharge Orders     None         Pricilla Loveless, MD 02/21/24 [phone]

## 2024-05-24 ENCOUNTER — Other Ambulatory Visit: Payer: Self-pay

## 2024-05-24 ENCOUNTER — Inpatient Hospital Stay (HOSPITAL_BASED_OUTPATIENT_CLINIC_OR_DEPARTMENT_OTHER)
Admission: EM | Admit: 2024-05-24 | Discharge: 2024-05-30 | DRG: 372 | Disposition: A | Discharge status: Home / Self Care | Attending: Internal Medicine | Admitting: Internal Medicine

## 2024-05-24 ENCOUNTER — Emergency Department (HOSPITAL_BASED_OUTPATIENT_CLINIC_OR_DEPARTMENT_OTHER)

## 2024-05-24 ENCOUNTER — Encounter (HOSPITAL_BASED_OUTPATIENT_CLINIC_OR_DEPARTMENT_OTHER): Payer: Self-pay

## 2024-05-24 DIAGNOSIS — E66811 Obesity, class 1: Secondary | ICD-10-CM | POA: Diagnosis present

## 2024-05-24 DIAGNOSIS — Z87891 Personal history of nicotine dependence: Secondary | ICD-10-CM

## 2024-05-24 DIAGNOSIS — F419 Anxiety disorder, unspecified: Secondary | ICD-10-CM | POA: Diagnosis present

## 2024-05-24 DIAGNOSIS — Z886 Allergy status to analgesic agent status: Secondary | ICD-10-CM

## 2024-05-24 DIAGNOSIS — F411 Generalized anxiety disorder: Secondary | ICD-10-CM | POA: Diagnosis present

## 2024-05-24 DIAGNOSIS — Z79899 Other long term (current) drug therapy: Secondary | ICD-10-CM

## 2024-05-24 DIAGNOSIS — Z9884 Bariatric surgery status: Secondary | ICD-10-CM

## 2024-05-24 DIAGNOSIS — Z8673 Personal history of transient ischemic attack (TIA), and cerebral infarction without residual deficits: Secondary | ICD-10-CM

## 2024-05-24 DIAGNOSIS — Z9049 Acquired absence of other specified parts of digestive tract: Secondary | ICD-10-CM

## 2024-05-24 DIAGNOSIS — Z888 Allergy status to other drugs, medicaments and biological substances status: Secondary | ICD-10-CM

## 2024-05-24 DIAGNOSIS — R197 Diarrhea, unspecified: Secondary | ICD-10-CM

## 2024-05-24 DIAGNOSIS — Z6831 Body mass index (BMI) 31.0-31.9, adult: Secondary | ICD-10-CM

## 2024-05-24 DIAGNOSIS — K529 Noninfective gastroenteritis and colitis, unspecified: Secondary | ICD-10-CM | POA: Diagnosis not present

## 2024-05-24 DIAGNOSIS — Z825 Family history of asthma and other chronic lower respiratory diseases: Secondary | ICD-10-CM

## 2024-05-24 DIAGNOSIS — E871 Hypo-osmolality and hyponatremia: Secondary | ICD-10-CM | POA: Diagnosis present

## 2024-05-24 DIAGNOSIS — I4510 Unspecified right bundle-branch block: Secondary | ICD-10-CM | POA: Diagnosis present

## 2024-05-24 DIAGNOSIS — F319 Bipolar disorder, unspecified: Secondary | ICD-10-CM | POA: Diagnosis present

## 2024-05-24 DIAGNOSIS — E1165 Type 2 diabetes mellitus with hyperglycemia: Secondary | ICD-10-CM | POA: Diagnosis present

## 2024-05-24 DIAGNOSIS — Z833 Family history of diabetes mellitus: Secondary | ICD-10-CM

## 2024-05-24 DIAGNOSIS — E861 Hypovolemia: Secondary | ICD-10-CM | POA: Diagnosis present

## 2024-05-24 DIAGNOSIS — Z885 Allergy status to narcotic agent status: Secondary | ICD-10-CM

## 2024-05-24 DIAGNOSIS — E876 Hypokalemia: Secondary | ICD-10-CM | POA: Diagnosis present

## 2024-05-24 DIAGNOSIS — I129 Hypertensive chronic kidney disease with stage 1 through stage 4 chronic kidney disease, or unspecified chronic kidney disease: Secondary | ICD-10-CM | POA: Diagnosis present

## 2024-05-24 DIAGNOSIS — D631 Anemia in chronic kidney disease: Secondary | ICD-10-CM | POA: Diagnosis present

## 2024-05-24 DIAGNOSIS — D509 Iron deficiency anemia, unspecified: Secondary | ICD-10-CM | POA: Diagnosis present

## 2024-05-24 DIAGNOSIS — E11649 Type 2 diabetes mellitus with hypoglycemia without coma: Secondary | ICD-10-CM | POA: Diagnosis not present

## 2024-05-24 DIAGNOSIS — E1122 Type 2 diabetes mellitus with diabetic chronic kidney disease: Secondary | ICD-10-CM | POA: Diagnosis present

## 2024-05-24 DIAGNOSIS — Z8249 Family history of ischemic heart disease and other diseases of the circulatory system: Secondary | ICD-10-CM

## 2024-05-24 DIAGNOSIS — N1832 Chronic kidney disease, stage 3b: Secondary | ICD-10-CM | POA: Diagnosis present

## 2024-05-24 DIAGNOSIS — E860 Dehydration: Secondary | ICD-10-CM | POA: Diagnosis present

## 2024-05-24 DIAGNOSIS — A020 Salmonella enteritis: Principal | ICD-10-CM | POA: Diagnosis present

## 2024-05-24 DIAGNOSIS — R109 Unspecified abdominal pain: Secondary | ICD-10-CM | POA: Diagnosis present

## 2024-05-24 DIAGNOSIS — D519 Vitamin B12 deficiency anemia, unspecified: Secondary | ICD-10-CM | POA: Diagnosis present

## 2024-05-24 DIAGNOSIS — E119 Type 2 diabetes mellitus without complications: Secondary | ICD-10-CM

## 2024-05-24 DIAGNOSIS — K219 Gastro-esophageal reflux disease without esophagitis: Secondary | ICD-10-CM | POA: Diagnosis present

## 2024-05-24 DIAGNOSIS — D638 Anemia in other chronic diseases classified elsewhere: Secondary | ICD-10-CM | POA: Diagnosis present

## 2024-05-24 DIAGNOSIS — Z794 Long term (current) use of insulin: Secondary | ICD-10-CM

## 2024-05-24 DIAGNOSIS — E872 Acidosis, unspecified: Secondary | ICD-10-CM | POA: Diagnosis present

## 2024-05-24 DIAGNOSIS — N179 Acute kidney failure, unspecified: Secondary | ICD-10-CM | POA: Diagnosis not present

## 2024-05-24 DIAGNOSIS — R112 Nausea with vomiting, unspecified: Principal | ICD-10-CM | POA: Diagnosis present

## 2024-05-24 LAB — COMPREHENSIVE METABOLIC PANEL WITH GFR
ALT: 12 U/L (ref 0–44)
ALT: 14 U/L (ref 0–44)
AST: 24 U/L (ref 15–41)
AST: 22 U/L (ref 15–41)
Albumin: 3.3 g/dL — ABNORMAL LOW (ref 3.5–5.0)
Albumin: 2.5 g/dL — ABNORMAL LOW (ref 3.5–5.0)
Alkaline Phosphatase: 97 U/L (ref 38–126)
Alkaline Phosphatase: 84 U/L (ref 38–126)
Anion gap: 22 — ABNORMAL HIGH (ref 5–15)
Anion gap: 15 (ref 5–15)
BUN: 54 mg/dL — ABNORMAL HIGH (ref 6–20)
BUN: 53 mg/dL — ABNORMAL HIGH (ref 6–20)
CO2: 11 mmol/L — ABNORMAL LOW (ref 22–32)
CO2: 10 mmol/L — ABNORMAL LOW (ref 22–32)
Calcium: 7.8 mg/dL — ABNORMAL LOW (ref 8.9–10.3)
Calcium: 7.4 mg/dL — ABNORMAL LOW (ref 8.9–10.3)
Chloride: 97 mmol/L — ABNORMAL LOW (ref 98–111)
Chloride: 107 mmol/L (ref 98–111)
Creatinine, Ser: 3.45 mg/dL — ABNORMAL HIGH (ref 0.44–1.00)
Creatinine, Ser: 3.23 mg/dL — ABNORMAL HIGH (ref 0.44–1.00)
GFR, Estimated: 16 mL/min — ABNORMAL LOW (ref >60)
GFR, Estimated: 17 mL/min — ABNORMAL LOW (ref >60)
Glucose, Bld: 133 mg/dL — ABNORMAL HIGH (ref 70–99)
Glucose, Bld: 168 mg/dL — ABNORMAL HIGH (ref 70–99)
Potassium: 3.5 mmol/L (ref 3.5–5.1)
Potassium: 3.2 mmol/L — ABNORMAL LOW (ref 3.5–5.1)
Sodium: 130 mmol/L — ABNORMAL LOW (ref 135–145)
Sodium: 132 mmol/L — ABNORMAL LOW (ref 135–145)
Total Bilirubin: 0.3 mg/dL (ref 0.0–1.2)
Total Bilirubin: 0.4 mg/dL (ref 0.0–1.2)
Total Protein: 7.1 g/dL (ref 6.5–8.1)
Total Protein: 6.9 g/dL (ref 6.5–8.1)

## 2024-05-24 LAB — CBC
HCT: 35.8 % — ABNORMAL LOW (ref 36.0–46.0)
Hemoglobin: 11.6 g/dL — ABNORMAL LOW (ref 12.0–15.0)
MCH: 28 pg (ref 26.0–34.0)
MCHC: 32.4 g/dL (ref 30.0–36.0)
MCV: 86.5 fL (ref 80.0–100.0)
Platelets: 236 10*3/uL (ref 150–400)
RBC: 4.14 MIL/uL (ref 3.87–5.11)
RDW: 14.6 % (ref 11.5–15.5)
WBC: 3.5 10*3/uL — ABNORMAL LOW (ref 4.0–10.5)
nRBC: 0 % (ref 0.0–0.2)

## 2024-05-24 LAB — CBC WITH DIFFERENTIAL/PLATELET
Abs Immature Granulocytes: 0.03 10*3/uL (ref 0.00–0.07)
Basophils Absolute: 0 10*3/uL (ref 0.0–0.1)
Basophils Relative: 1 %
Eosinophils Absolute: 0 10*3/uL (ref 0.0–0.5)
Eosinophils Relative: 0 %
HCT: 32.9 % — ABNORMAL LOW (ref 36.0–46.0)
Hemoglobin: 11.1 g/dL — ABNORMAL LOW (ref 12.0–15.0)
Immature Granulocytes: 1 %
Lymphocytes Relative: 25 %
Lymphs Abs: 1 10*3/uL (ref 0.7–4.0)
MCH: 28.8 pg (ref 26.0–34.0)
MCHC: 33.7 g/dL (ref 30.0–36.0)
MCV: 85.2 fL (ref 80.0–100.0)
Monocytes Absolute: 0.4 10*3/uL (ref 0.1–1.0)
Monocytes Relative: 10 %
Neutro Abs: 2.5 10*3/uL (ref 1.7–7.7)
Neutrophils Relative %: 63 %
Platelets: 242 10*3/uL (ref 150–400)
RBC: 3.86 MIL/uL — ABNORMAL LOW (ref 3.87–5.11)
RDW: 14.6 % (ref 11.5–15.5)
WBC: 4.1 10*3/uL (ref 4.0–10.5)
nRBC: 0 % (ref 0.0–0.2)

## 2024-05-24 LAB — HCG, SERUM, QUALITATIVE: Preg, Serum: NEGATIVE

## 2024-05-24 LAB — BLOOD GAS, VENOUS
Acid-base deficit: 14.9 mmol/L — ABNORMAL HIGH (ref 0.0–2.0)
Bicarbonate: 11.6 mmol/L — ABNORMAL LOW (ref 20.0–28.0)
O2 Saturation: 80.4 %
Patient temperature: 36.8
pCO2, Ven: 29 mmHg — ABNORMAL LOW (ref 44–60)
pH, Ven: 7.21 — ABNORMAL LOW (ref 7.25–7.43)
pO2, Ven: 51 mmHg — ABNORMAL HIGH (ref 32–45)

## 2024-05-24 LAB — BETA-HYDROXYBUTYRIC ACID: Beta-Hydroxybutyric Acid: 0.48 mmol/L — ABNORMAL HIGH (ref 0.05–0.27)

## 2024-05-24 LAB — GLUCOSE, CAPILLARY
Glucose-Capillary: 89 mg/dL (ref 70–99)
Glucose-Capillary: 164 mg/dL — ABNORMAL HIGH (ref 70–99)
Glucose-Capillary: 177 mg/dL — ABNORMAL HIGH (ref 70–99)
Glucose-Capillary: 175 mg/dL — ABNORMAL HIGH (ref 70–99)

## 2024-05-24 LAB — LACTIC ACID, PLASMA
Lactic Acid, Venous: 1.5 mmol/L (ref 0.5–1.9)
Lactic Acid, Venous: 0.9 mmol/L (ref 0.5–1.9)

## 2024-05-24 LAB — MAGNESIUM: Magnesium: 1.5 mg/dL — ABNORMAL LOW (ref 1.7–2.4)

## 2024-05-24 LAB — PHOSPHORUS: Phosphorus: 6.8 mg/dL — ABNORMAL HIGH (ref 2.5–4.6)

## 2024-05-24 MED ORDER — INSULIN ASPART 100 UNIT/ML IJ SOLN
0.0000 [IU] | INTRAMUSCULAR | Status: DC
  Administered 2024-05-24 – 2024-05-25 (×6): 3 [IU] via SUBCUTANEOUS
  Administered 2024-05-26 – 2024-05-27 (×3): 2 [IU] via SUBCUTANEOUS

## 2024-05-24 MED ORDER — SODIUM CHLORIDE 0.9 % IV BOLUS
1000.0000 mL | Freq: Once | INTRAVENOUS | Status: AC
  Administered 2024-05-24: 1000 mL via INTRAVENOUS

## 2024-05-24 MED ORDER — POTASSIUM CHLORIDE 20 MEQ PO PACK
20.0000 meq | PACK | Freq: Once | ORAL | Status: AC
  Administered 2024-05-24: 20 meq via ORAL
  Filled 2024-05-24: qty 1

## 2024-05-24 MED ORDER — INSULIN GLARGINE-YFGN 100 UNIT/ML ~~LOC~~ SOLN
5.0000 [IU] | Freq: Every day | SUBCUTANEOUS | Status: DC
  Filled 2024-05-24: qty 0.05

## 2024-05-24 MED ORDER — MELATONIN 5 MG PO TABS
5.0000 mg | ORAL_TABLET | Freq: Every evening | ORAL | Status: DC | PRN
  Administered 2024-05-27: 5 mg via ORAL
  Filled 2024-05-24 (×2): qty 1

## 2024-05-24 MED ORDER — DROPERIDOL 2.5 MG/ML IJ SOLN
1.2500 mg | Freq: Once | INTRAMUSCULAR | Status: AC
  Administered 2024-05-24: 1.25 mg via INTRAVENOUS

## 2024-05-24 MED ORDER — ACETAMINOPHEN 325 MG PO TABS: 650.0000 mg | ORAL_TABLET | Freq: Four times a day (QID) | ORAL | Status: DC | PRN

## 2024-05-24 MED ORDER — PROCHLORPERAZINE EDISYLATE 10 MG/2ML IJ SOLN
5.0000 mg | Freq: Four times a day (QID) | INTRAMUSCULAR | Status: DC | PRN
  Administered 2024-05-24 – 2024-05-25 (×3): 5 mg via INTRAVENOUS
  Filled 2024-05-24 (×3): qty 2

## 2024-05-24 MED ORDER — PANTOPRAZOLE SODIUM 40 MG PO TBEC
40.0000 mg | DELAYED_RELEASE_TABLET | Freq: Every day | ORAL | Status: DC
  Administered 2024-05-24 – 2024-05-27 (×4): 40 mg via ORAL
  Filled 2024-05-24 (×4): qty 1

## 2024-05-24 MED ORDER — FLUOXETINE HCL 20 MG PO CAPS
40.0000 mg | ORAL_CAPSULE | Freq: Every day | ORAL | Status: DC
  Administered 2024-05-24 – 2024-05-30 (×7): 40 mg via ORAL
  Filled 2024-05-24 (×7): qty 2

## 2024-05-24 MED ORDER — INSULIN GLARGINE-YFGN 100 UNIT/ML ~~LOC~~ SOLN
6.0000 [IU] | Freq: Every day | SUBCUTANEOUS | Status: DC
  Administered 2024-05-24 – 2024-05-27 (×4): 6 [IU] via SUBCUTANEOUS
  Filled 2024-05-24 (×5): qty 0.06

## 2024-05-24 MED ORDER — ALPRAZOLAM 0.5 MG PO TABS
1.0000 mg | ORAL_TABLET | Freq: Three times a day (TID) | ORAL | Status: DC | PRN
  Administered 2024-05-24 – 2024-05-28 (×9): 1 mg via ORAL
  Filled 2024-05-24 (×10): qty 2

## 2024-05-24 MED ORDER — OXYCODONE HCL 5 MG PO TABS
5.0000 mg | ORAL_TABLET | Freq: Four times a day (QID) | ORAL | Status: DC | PRN
  Administered 2024-05-24 – 2024-05-30 (×8): 5 mg via ORAL
  Filled 2024-05-24 (×8): qty 1

## 2024-05-24 MED ORDER — FENTANYL CITRATE PF 50 MCG/ML IJ SOSY
50.0000 ug | PREFILLED_SYRINGE | Freq: Once | INTRAMUSCULAR | Status: AC
  Administered 2024-05-24: 50 ug via INTRAVENOUS
  Filled 2024-05-24: qty 1

## 2024-05-24 MED ORDER — IOHEXOL 300 MG/ML  SOLN: 100.0000 mL | Freq: Once | INTRAMUSCULAR | Status: DC | PRN

## 2024-05-24 MED ORDER — SODIUM CHLORIDE 0.9 % IV SOLN: INTRAVENOUS | Status: DC

## 2024-05-24 MED ORDER — ACETAMINOPHEN 650 MG RE SUPP: 650.0000 mg | Freq: Four times a day (QID) | RECTAL | Status: DC | PRN

## 2024-05-24 MED ORDER — SODIUM BICARBONATE 650 MG PO TABS
650.0000 mg | ORAL_TABLET | Freq: Three times a day (TID) | ORAL | Status: DC
  Administered 2024-05-24 – 2024-05-26 (×6): 650 mg via ORAL
  Filled 2024-05-24 (×6): qty 1

## 2024-05-24 MED ORDER — MAGNESIUM SULFATE 2 GM/50ML IV SOLN
2.0000 g | Freq: Once | INTRAVENOUS | Status: AC
  Administered 2024-05-24: 2 g via INTRAVENOUS
  Filled 2024-05-24: qty 50

## 2024-05-24 MED ORDER — FENTANYL CITRATE PF 50 MCG/ML IJ SOSY
25.0000 ug | PREFILLED_SYRINGE | INTRAMUSCULAR | Status: DC | PRN
  Administered 2024-05-24 – 2024-05-26 (×8): 50 ug via INTRAVENOUS
  Filled 2024-05-24 (×8): qty 1

## 2024-05-24 MED ORDER — HEPARIN SODIUM (PORCINE) 5000 UNIT/ML IJ SOLN
5000.0000 [IU] | Freq: Three times a day (TID) | INTRAMUSCULAR | Status: DC
  Administered 2024-05-24 – 2024-05-30 (×18): 5000 [IU] via SUBCUTANEOUS
  Filled 2024-05-24 (×17): qty 1

## 2024-05-24 NOTE — Plan of Care (Signed)

## 2024-05-24 NOTE — Hospital Course (Addendum)
 Patient's symmptoms started 3 d ago with N/V/D. About 2 days ago transitioned to diarrhea only. Last bowel movement was yesterday. Patient denies seeing any gross blood in her stools. No recent antibiotics. NO recent sick contacts.    Had low grade fever up to 99.5. Some chills as well.    No blood in vomit. Not able to eat and drink much because of vomiting. Has not urinated in a day r so.  13 unites of insulin  at night.    150meq 187mL/h

## 2024-05-24 NOTE — ED Notes (Signed)
 Pt leaving with carelink to [facility] at this time. All paperwork sent with carelink. Patient in NAD upon departure.

## 2024-05-24 NOTE — ED Triage Notes (Signed)
 Triaged by Ranette, RN during downtime.  Pt brought in by EMS for abdominal pain, N/V/D x3 days. Pain is in rt side of abdomen with associated fever.

## 2024-05-24 NOTE — ED Provider Notes (Signed)
 [place] [facility] AT [facility] Provider Note   CSN: [id number] Arrival date & time: 05/24/24  0205     History  Chief Complaint  Patient presents with   Abdominal Pain    Virginia [name] is a 48 y.o. female.  The history is provided by the patient.  Abdominal Pain Pain location:  RLQ Pain quality: cramping   Pain radiates to:  Does not radiate Pain severity:  Moderate Onset quality:  Gradual Duration:  3 days Progression:  Waxing and waning Chronicity:  New Context: not eating and not laxative use   Relieved by:  Nothing Worsened by:  Nothing Ineffective treatments:  None tried Associated symptoms: diarrhea, nausea and vomiting   Associated symptoms: no fever   Risk factors: no aspirin  use, not elderly and not pregnant   Patient with Bipolar disorder and DM with 3 days of nausea vomiting and diarrhea.      Past Medical History:  Diagnosis Date   Anxiety    Arthritis    Bipolar affective (HCC)    Depression    Diabetes mellitus    Hypertension    Renal disorder      Home Medications Prior to Admission medications   Medication Sig Start Date End Date Taking? Authorizing Provider  acetaminophen  (TYLENOL ) 325 MG tablet Take 2 tablets (650 mg total) by mouth every 6 (six) hours as needed for mild pain (pain score 1-3) (or Fever >/= 101). 02/07/24   Sheikh, Omair Latif, DO  albuterol  (VENTOLIN  HFA) 108 [phone] Base) MCG/ACT inhaler Inhale 2 puffs every 4 (four) hours as needed into the lungs for wheezing or shortness of breath.    [provider]  ALPRAZolam  (XANAX ) 1 MG tablet Take 1 mg by mouth 3 (three) times daily as needed for anxiety. 02/26/20   [provider]  benzonatate  (TESSALON ) 200 MG capsule Take 1 capsule (200 mg total) by mouth 3 (three) times daily as needed for cough. 02/07/24   Sheikh, Omair Latif, DO  Blood Glucose Monitoring Suppl DEVI 1 each by Does not apply route 3 (three) times daily. May dispense any  manufacturer covered by patient's insurance. 02/07/24   Aura Leeds Latif, DO  Continuous Glucose Sensor (FREESTYLE LIBRE 3 SENSOR) MISC Place 1 sensor on the skin every 14 days. Use to check glucose continuously 05/25/23   Sheril Dines, MD  feeding supplement (ENSURE ENLIVE / ENSURE PLUS) LIQD Take 237 mLs by mouth 2 (two) times daily between meals. 02/07/24   Aura Leeds Latif, DO  FLUoxetine  (PROZAC ) 40 MG capsule Take 40 mg by mouth daily. 12/21/19   [provider]  fluticasone  (FLONASE ) 50 MCG/ACT nasal spray Place 1 spray into both nostrils at bedtime. 05/25/23 02/03/24  Sheril Dines, MD  Glucose Blood (BLOOD GLUCOSE TEST STRIPS) STRP 1 each by Does not apply route 3 (three) times daily. Use as directed to check blood sugar. May dispense any manufacturer covered by patient's insurance and fits patient's device. 02/07/24   Aura Leeds Latif, DO  insulin  glargine (LANTUS ) 100 UNIT/ML Solostar Pen Inject 10 Units into the skin daily. 02/07/24   Sheikh, Omair Latif, DO  Insulin  Pen Needle (PEN NEEDLES) 31G X 5 MM MISC 1 each by Does not apply route 3 (three) times daily. May dispense any manufacturer covered by patient's insurance. 02/07/24   Eveline Hipps, DO  Lancet Device MISC 1 each by Does not apply route 3 (three) times daily. May dispense any manufacturer covered by patient's insurance. 02/07/24  Sheikh, Omair Latif, DO  Lancets MISC 1 each by Does not apply route 3 (three) times daily. Use as directed to check blood sugar. May dispense any manufacturer covered by patient's insurance and fits patient's device. 02/07/24   Sheikh, Omair Latif, DO  Multiple Vitamin (MULTIVITAMIN WITH MINERALS) TABS tablet Take 1 tablet by mouth daily. 02/08/24   Aura Leeds Latif, DO  oxyCODONE  (ROXICODONE ) 5 MG immediate release tablet Take 1 tablet (5 mg total) by mouth every 6 (six) hours as needed for severe pain (pain score 7-10). 02/07/24   Aura Leeds Latif, DO      Allergies    Ace  inhibitors, Banana, Lamictal [lamotrigine], Morphine , Nsaids, and Topiramate    Review of Systems   Review of Systems  Constitutional:  Negative for fever.  HENT:  Negative for facial swelling.   Respiratory:  Negative for wheezing and stridor.   Gastrointestinal:  Positive for abdominal pain, diarrhea, nausea and vomiting.  All other systems reviewed and are negative.   Physical Exam Updated Vital Signs BP 133/83   Pulse 87   Temp 98 F (36.7 C) (Oral)   Resp 18   Ht 5' 1 (1.549 m)   Wt 79.4 kg   SpO2 100%   BMI 33.07 kg/m  Physical Exam Vitals and nursing note reviewed.  Constitutional:      General: She is not in acute distress.    Appearance: She is well-developed. She is not ill-appearing.  HENT:     Head: Normocephalic and atraumatic.     Nose: Nose normal.   Eyes:     Pupils: Pupils are equal, round, and reactive to light.    Cardiovascular:     Rate and Rhythm: Normal rate and regular rhythm.     Pulses: Normal pulses.     Heart sounds: Normal heart sounds.  Pulmonary:     Effort: Pulmonary effort is normal. No respiratory distress.     Breath sounds: Normal breath sounds.  Abdominal:     General: Bowel sounds are normal. There is no distension.     Palpations: Abdomen is soft.     Tenderness: There is no abdominal tenderness. There is no guarding or rebound.   Musculoskeletal:        General: Normal range of motion.     Cervical back: Neck supple.   Skin:    General: Skin is warm and dry.     Capillary Refill: Capillary refill takes less than 2 seconds.     Findings: No erythema or rash.   Neurological:     General: No focal deficit present.     Mental Status: She is alert.     Deep Tendon Reflexes: Reflexes normal.   Psychiatric:        Mood and Affect: Mood normal.     ED Results / Procedures / Treatments   Labs (all labs ordered are listed, but only abnormal results are displayed) Results for orders placed or performed during the  hospital encounter of 05/24/24  CBC with Differential/Platelet   Collection Time: 05/24/24  1:49 AM  Result Value Ref Range   WBC 4.1 4.0 - 10.5 K/uL   RBC 3.86 (L) 3.87 - 5.11 MIL/uL   Hemoglobin 11.1 (L) 12.0 - 15.0 g/dL   HCT 16.1 (L) 09.6 - 04.5 %   MCV 85.2 80.0 - 100.0 fL   MCH 28.8 26.0 - 34.0 pg   MCHC 33.7 30.0 - 36.0 g/dL   RDW 40.9 81.1 - 91.4 %  Platelets 242 150 - 400 K/uL   nRBC 0.0 0.0 - 0.2 %   Neutrophils Relative % 63 %   Neutro Abs 2.5 1.7 - 7.7 K/uL   Lymphocytes Relative 25 %   Lymphs Abs 1.0 0.7 - 4.0 K/uL   Monocytes Relative 10 %   Monocytes Absolute 0.4 0.1 - 1.0 K/uL   Eosinophils Relative 0 %   Eosinophils Absolute 0.0 0.0 - 0.5 K/uL   Basophils Relative 1 %   Basophils Absolute 0.0 0.0 - 0.1 K/uL   WBC Morphology MORPHOLOGY UNREMARKABLE    Immature Granulocytes 1 %   Abs Immature Granulocytes 0.03 0.00 - 0.07 K/uL  Comprehensive metabolic panel with GFR   Collection Time: 05/24/24  1:49 AM  Result Value Ref Range   Sodium 130 (L) 135 - 145 mmol/L   Potassium 3.5 3.5 - 5.1 mmol/L   Chloride 97 (L) 98 - 111 mmol/L   CO2 11 (L) 22 - 32 mmol/L   Glucose, Bld 133 (H) 70 - 99 mg/dL   BUN 54 (H) 6 - 20 mg/dL   Creatinine, Ser 1.61 (H) 0.44 - 1.00 mg/dL   Calcium  7.8 (L) 8.9 - 10.3 mg/dL   Total Protein 7.1 6.5 - 8.1 g/dL   Albumin 3.3 (L) 3.5 - 5.0 g/dL   AST 24 15 - 41 U/L   ALT 12 0 - 44 U/L   Alkaline Phosphatase 97 38 - 126 U/L   Total Bilirubin 0.3 0.0 - 1.2 mg/dL   GFR, Estimated 16 (L) >60 mL/min   Anion gap 22 (H) 5 - 15  hCG, serum, qualitative   Collection Time: 05/24/24  1:49 AM  Result Value Ref Range   Preg, Serum NEGATIVE NEGATIVE   No results found.  EKG  Date: 05/24/2024  Rate: 83  Rhythm: normal sinus rhythm  QRS Axis: normal  Intervals: normal  ST/T Wave abnormalities: normal  Conduction Disutrbances: RBBB  Narrative Interpretation:  right bundle branch block     Radiology No results  found.  Procedures Procedures    Medications Ordered in ED Medications - No data to display  ED Course/ Medical Decision Making/ A&P                                 Medical Decision Making Patient with 3 days of n/v/d   Amount and/or Complexity of Data Reviewed Independent Historian: EMS    Details: See above  External Data Reviewed: notes.    Details: Previous notes reviewed  Labs: ordered.    Details: Negative pregnancy test, normal white count 4.1, slight low hemoglobin 11.1, normal platelets. Sodium low 130, normal potassium 3.5 elevated BUN 54, elevated creatinine 3.45.  low calcium  7.8, normal LFTs Radiology: ordered and independent interpretation performed.    Details: Enteritis by me  Risk Prescription drug management. Decision regarding hospitalization.    Final Clinical Impression(s) / ED Diagnoses Final diagnoses:  None   The patient appears reasonably stabilized for admission considering the current resources, flow, and capabilities available in the ED at this time, and I doubt any other [facility] requiring further screening and/or treatment in the ED prior to admission.  Rx / DC Orders ED Discharge Orders     None         Ilyssa Grennan, MD 05/24/24 0960

## 2024-05-24 NOTE — H&P (Addendum)
 History and Physical    Rochelle Larue ZOX:[id number] DOB: 17-Aug-1976 DOA: 05/24/2024  DOS: the patient was seen and examined on 05/24/2024  PCP: Pcp, No   Patient coming from: Home  I have personally briefly reviewed patient's old medical records in [place] [facility]  HPI:  No notes on file   Grissel Emaree Chiu is a 48 y.o. year old female with past medical history of CKD stage IIIb with baseline creatinine 1.3-1.7, type 2 diabetes mellitus, generalized anxiety disorder, allergic rhinitis, anemia of chronic disease with baseline hemoglobin 10-12, GERD, and prior CVA with no reported deficits.  She presents to [facility] with 3 to 4 days of nausea, vomiting, and diarrhea. She also endorses poor p.o. intake over that same time interval.  Vomiting is nonbloody, nonbilious and diarrhea is described as loose and mucousy.  Also of note patient reports eating a lot of tomatoes over the last week, this is relevant in the setting of recent FDA class I recall of tomatoes for possible Salmonella contaminant.  She denies any family members having similar symptoms and has not eaten anything abnormal for her. No recent travel. She endorses fatigue, generalized weakness, and abdominal pain.  Denies fevers, chills, or dysuria.  ED Course: On arrival to [facility] ED patient was noted to be afebrile temp 36.7 C, BP 133/83, HR 87, RR 18, SpO2 100% on room air.  At that time she was porting 7 out of 10 abdominal pain.  Labs notable for hemoglobin 11.1, sodium 130, chloride 97, CO2 11, BUN 54, creatinine 3.45, calcium  7.8, albumin 3.3, and anion gap 22.  CT abdomen pelvis obtained and shows fluid-filled small bowel without transition point with liquid stool in the colon compatible with enteritis.  She was given droperidol, fentanyl , 1 L normal saline bolus and started on continuous normal saline infusion. [facility] contacted for admission.  Review of Systems: As mentioned in the history of present illness.  All other systems reviewed and are negative.   Past Medical History:  Diagnosis Date   Anxiety    Arthritis    Bipolar affective (HCC)    Depression    Diabetes mellitus    Hypertension    Renal disorder     Past Surgical History:  Procedure Laterality Date   CESAREAN SECTION     CHOLECYSTECTOMY     GASTRIC BYPASS     KNEE SURGERY       reports that she quit smoking about 11 years ago. Her smoking use included cigarettes. She has never used smokeless tobacco. She reports current alcohol use. She reports that she does not use drugs.  Allergies  Allergen Reactions   Ace Inhibitors Nausea And Vomiting and Cough   Banana Nausea And Vomiting   Lamictal [Lamotrigine] Nausea And Vomiting   Morphine  Itching   Nsaids Other (See Comments)    Kidney failure   Topiramate Nausea And Vomiting    Family History  Problem Relation Age of Onset   Asthma Mother    Hypertension Mother    Hypertension Father    Diabetes Mellitus II Father     Prior to Admission medications   Medication Sig Start Date End Date Taking? Authorizing Provider  acetaminophen  (TYLENOL ) 325 MG tablet Take 2 tablets (650 mg total) by mouth every 6 (six) hours as needed for mild pain (pain score 1-3) (or Fever >/= 101). 02/07/24   Sheikh, Omair Latif, DO  albuterol  (VENTOLIN  HFA) 108 [phone] Base) MCG/ACT inhaler Inhale 2 puffs every  4 (four) hours as needed into the lungs for wheezing or shortness of breath.    [provider]  ALPRAZolam  (XANAX ) 1 MG tablet Take 1 mg by mouth 3 (three) times daily as needed for anxiety. 02/26/20   [provider]  benzonatate  (TESSALON ) 200 MG capsule Take 1 capsule (200 mg total) by mouth 3 (three) times daily as needed for cough. 02/07/24   Sheikh, Omair Latif, DO  Blood Glucose Monitoring Suppl DEVI 1 each by Does not apply route 3 (three) times daily. May dispense any manufacturer covered by patient's insurance. 02/07/24   Aura Leeds Latif, DO  Continuous  Glucose Sensor (FREESTYLE LIBRE 3 SENSOR) MISC Place 1 sensor on the skin every 14 days. Use to check glucose continuously 05/25/23   Sheril Dines, MD  feeding supplement (ENSURE ENLIVE / ENSURE PLUS) LIQD Take 237 mLs by mouth 2 (two) times daily between meals. 02/07/24   Aura Leeds Latif, DO  FLUoxetine  (PROZAC ) 40 MG capsule Take 40 mg by mouth daily. 12/21/19   [provider]  fluticasone  (FLONASE ) 50 MCG/ACT nasal spray Place 1 spray into both nostrils at bedtime. 05/25/23 02/03/24  Sheril Dines, MD  Glucose Blood (BLOOD GLUCOSE TEST STRIPS) STRP 1 each by Does not apply route 3 (three) times daily. Use as directed to check blood sugar. May dispense any manufacturer covered by patient's insurance and fits patient's device. 02/07/24   Aura Leeds Latif, DO  insulin  glargine (LANTUS ) 100 UNIT/ML Solostar Pen Inject 10 Units into the skin daily. 02/07/24   Sheikh, Omair Latif, DO  Insulin  Pen Needle (PEN NEEDLES) 31G X 5 MM MISC 1 each by Does not apply route 3 (three) times daily. May dispense any manufacturer covered by patient's insurance. 02/07/24   Eveline Hipps, DO  Lancet Device MISC 1 each by Does not apply route 3 (three) times daily. May dispense any manufacturer covered by patient's insurance. 02/07/24   Aura Leeds Latif, DO  Lancets MISC 1 each by Does not apply route 3 (three) times daily. Use as directed to check blood sugar. May dispense any manufacturer covered by patient's insurance and fits patient's device. 02/07/24   Sheikh, Omair Latif, DO  Multiple Vitamin (MULTIVITAMIN WITH MINERALS) TABS tablet Take 1 tablet by mouth daily. 02/08/24   Aura Leeds Latif, DO  oxyCODONE  (ROXICODONE ) 5 MG immediate release tablet Take 1 tablet (5 mg total) by mouth every 6 (six) hours as needed for severe pain (pain score 7-10). 02/07/24   Aura Leeds [place], [place]    Physical Exam: Vitals:   05/24/24 0315 05/24/24 0333 05/24/24 0340 05/24/24 0435  BP: (!) 121/57 (!) 103/59   137/80  Pulse: (!) 111 88 88 84  Resp: 15 (!) 21 16 18   Temp:  98.6 F (37 C)  98.4 F (36.9 C)  TempSrc:  Oral  Oral  SpO2:  97% 99% 100%  Weight:    74.3 kg  Height:    5' 1 (1.549 m)     Constitutional: Fatigued, African- American woman Respiratory: clear to auscultation bilaterally, no wheezing, no crackles. Normal respiratory effort. No accessory muscle use.  Cardiovascular: Regular rate and rhythm, no murmurs / rubs / gallops. No extremity edema. 2+ radial and pedal pulses.  Abdomen: Guarding, bowel sounds positive x4 quadrants.  Skin: Warm, dry.  No rashes, lesions, ulcers.  Neurologic: CN 2-12 grossly intact. Alert and oriented x 3. Normal mood.    Labs on Admission: I have personally reviewed following labs and imaging  studies  CBC: Recent Labs  Lab 05/24/24 0149  WBC 4.1  NEUTROABS 2.5  HGB 11.1*  HCT 32.9*  MCV 85.2  PLT 242   Basic Metabolic Panel: Recent Labs  Lab 05/24/24 0149  NA 130*  K 3.5  CL 97*  CO2 11*  GLUCOSE 133*  BUN 54*  CREATININE 3.45*  CALCIUM  7.8*   GFR: Estimated Creatinine Clearance: 18.8 mL/min (A) (by C-G formula based on SCr of 3.45 mg/dL (H)). Liver Function Tests: Recent Labs  Lab 05/24/24 0149  AST 24  ALT 12  ALKPHOS 97  BILITOT 0.3  PROT 7.1  ALBUMIN 3.3*   No results for input(s): LIPASE, AMYLASE in the last 168 hours. No results for input(s): AMMONIA in the last 168 hours. Coagulation Profile: No results for input(s): INR, PROTIME in the last 168 hours. Cardiac Enzymes: No results for input(s): CKTOTAL, CKMB, CKMBINDEX, TROPONINI, TROPONINIHS in the last 168 hours. BNP (last 3 results) No results for input(s): BNP in the last 8760 hours. HbA1C: No results for input(s): HGBA1C in the last 72 hours. CBG: No results for input(s): GLUCAP in the last 168 hours. Lipid Profile: No results for input(s): CHOL, HDL, LDLCALC, TRIG, CHOLHDL, LDLDIRECT in the last 72  hours. Thyroid Function Tests: No results for input(s): TSH, T4TOTAL, FREET4, T3FREE, THYROIDAB in the last 72 hours. Anemia Panel: No results for input(s): VITAMINB12, FOLATE, FERRITIN, TIBC, IRON, RETICCTPCT in the last 72 hours. Urine analysis:    Component Value Date/Time   COLORURINE AMBER (A) 02/03/2024 1735   APPEARANCEUR CLOUDY (A) 02/03/2024 1735   APPEARANCEUR Hazy 07/09/2012 2031   LABSPEC 1.022 02/03/2024 1735   LABSPEC 1.016 07/09/2012 2031   PHURINE 5.0 02/03/2024 1735   GLUCOSEU 50 (A) 02/03/2024 1735   GLUCOSEU 150 mg/dL 12/15/7251 6644   HGBUR NEGATIVE 02/03/2024 1735   BILIRUBINUR NEGATIVE 02/03/2024 1735   BILIRUBINUR Negative 07/09/2012 2031   KETONESUR NEGATIVE 02/03/2024 1735   PROTEINUR >=300 (A) 02/03/2024 1735   UROBILINOGEN 1.0 10/25/2012 1509   NITRITE NEGATIVE 02/03/2024 1735   LEUKOCYTESUR SMALL (A) 02/03/2024 1735   LEUKOCYTESUR Negative 07/09/2012 2031    Radiological Exams on Admission: I have personally reviewed images CT ABDOMEN PELVIS WO CONTRAST Result Date: 05/24/2024 CLINICAL DATA:  Acute nonlocalized abdominal pain, nausea, vomiting, diarrhea. The pain is on the right side and there is fever EXAM: CT ABDOMEN AND PELVIS WITHOUT CONTRAST TECHNIQUE: Multidetector CT imaging of the abdomen and pelvis was performed following the standard protocol without IV contrast. RADIATION DOSE REDUCTION: This exam was performed according to the departmental dose-optimization program which includes automated exposure control, adjustment of the mA and/or kV according to patient size and/or use of iterative reconstruction technique. COMPARISON:  02/06/2024 FINDINGS: Lower chest: No acute abnormality. Hepatobiliary: Unremarkable liver. Cholecystectomy. No biliary dilation. Pancreas: Unremarkable. Spleen: Unremarkable. Adrenals/Urinary Tract: Normal adrenal glands. No urinary calculi or hydronephrosis. Bladder is unremarkable. Stomach/Bowel:  Postoperative change of Roux-en-Y gastric bypass. Patulous small bowel-small bowel anastomosis in the left abdomen. Fluid-filled distention the small bowel throughout the abdomen without definite transition point. Liquid stool in the colon compatible with diarrheal illness. No definite bowel wall thickening noting limitations of noncontrast exam. Normal appendix. Vascular/Lymphatic: No significant vascular findings are present. Prominent subcentimeter mesenteric and retroperitoneal lymph nodes are likely reactive. Reproductive: Unremarkable. Other: No free intraperitoneal fluid or air. Musculoskeletal: No acute fracture. IMPRESSION: Fluid-filled small bowel throughout the abdomen without definite transition point. Liquid stool in the colon compatible with diarrheal illness. Findings are favored  to enteritis. Electronically Signed   By: Rozell Cornet M.D.   On: 05/24/2024 03:09    EKG: My personal interpretation of EKG shows: Normal sinus rhythm with right bundle branch block and left posterior fascicular block with possible ventricular hypertrophy, HR 83    Assessment/Plan Principal Problem:   Acute renal failure (ARF) (HCC)   ##Acute kidney injury superimposed on chronic kidney disease stage IIIb ## Increased anion gap metabolic acidosis - Continue NS at 125 mL/h - Hold nephrotoxic meds: Diuretic, NSAID, ACE, ARB - Avoid nephrotoxins, Contrast Dyes, Hypotension and Dehydration  - Renally adjust meds - Continue to Monitor and Trend Renal Function carefully and repeat BMP with a.m. labs  ##Enteritis ##Dehydration - Check GI pathogen panel - Check lactic acid - Supportive care with IVF hydration, as needed analgesia, as needed antiemetics - Clear liquid diet, progress as able  #Type 2 diabetes mellitus Takes 13 units Lantus  daily at home; A1c 9.7% in February 2025 - 6 units Lantus  nightly; 50% dose reduction while patient is on clear liquid diet - Every 4 hour SSI and CBG monitoring  while on clear liquid diet - Check A1c with a.m. labs  #GERD - Protonix   #Anemia of chronic disease Stable. Patient denies recent bleeding, bruising.  No signs of bleeding, bruising, petechiae on physical exam. - Repeat CBC with a.m. labs  # Generalized anxiety disorder PDMP reviewed -Home as needed Xanax    VTE prophylaxis:  SQ Heparin  GI prophylaxis: Protonix  Diet: Clear liquid Access: PIV Lines: None Code Status: Full code Telemetry: Yes Disposition: Observe MedSurg with telemetry  Admission status: Observation, Telemetry bed Patient is from: Home Anticipated d/c is to: Home Anticipated d/c date is: 1 to 2 days Patient currently: Receiving IV fluids, pending improvement in creatinine, and symptomatic improvement  Family Communication: No family at bedside  Consults called: N/A    Severity of Illness: The appropriate patient status for this patient is OBSERVATION. Observation status is judged to be reasonable and necessary in order to provide the required intensity of service to ensure the patient's safety. The patient's presenting symptoms, physical exam findings, and initial radiographic and laboratory data in the context of their medical condition is felt to place them at decreased risk for further clinical deterioration. Furthermore, it is anticipated that the patient will be medically stable for discharge from the hospital within 2 midnights of admission.   To reach the provider On-Call:   7AM- 7PM see care teams to locate the attending and reach out to them via [email or web address] Password: [email or web address] 7PM-7AM contact night-coverage If you still have difficulty reaching the appropriate provider, please page the [facility] (Director on Call) for [facility] on amion for assistance  This document was prepared using Conservation officer, historic buildings and may include unintentional dictation errors.  Naida Austria FNP-BC, PMHNP-BC Nurse Practitioner [facility]

## 2024-05-24 NOTE — Progress Notes (Signed)
 New Admission Note:    Arrival Method: ED stretcher Mental Orientation: AAOx4 Telemetry: 5M22 Assessment: Completed Skin: See flowsheet IV: RFA Pain: 0/10 Tubes: n/a Safety Measures: Safety Fall Prevention Plan has been given, discussed and signed Admission: Completed [facility] Orientation: Patient has been orientated to the room, unit and staff.  Family: none at bedside   Orders have been reviewed and implemented. Will continue to monitor the patient. Call light has been placed within reach and bed alarm has been activated.

## 2024-05-25 DIAGNOSIS — F411 Generalized anxiety disorder: Secondary | ICD-10-CM | POA: Diagnosis present

## 2024-05-25 DIAGNOSIS — E872 Acidosis, unspecified: Secondary | ICD-10-CM | POA: Diagnosis present

## 2024-05-25 DIAGNOSIS — E871 Hypo-osmolality and hyponatremia: Secondary | ICD-10-CM | POA: Diagnosis present

## 2024-05-25 DIAGNOSIS — E1122 Type 2 diabetes mellitus with diabetic chronic kidney disease: Secondary | ICD-10-CM | POA: Diagnosis present

## 2024-05-25 DIAGNOSIS — E860 Dehydration: Secondary | ICD-10-CM | POA: Diagnosis present

## 2024-05-25 DIAGNOSIS — E861 Hypovolemia: Secondary | ICD-10-CM | POA: Diagnosis present

## 2024-05-25 DIAGNOSIS — E1165 Type 2 diabetes mellitus with hyperglycemia: Secondary | ICD-10-CM | POA: Diagnosis present

## 2024-05-25 DIAGNOSIS — A020 Salmonella enteritis: Secondary | ICD-10-CM | POA: Diagnosis present

## 2024-05-25 DIAGNOSIS — F319 Bipolar disorder, unspecified: Secondary | ICD-10-CM | POA: Diagnosis present

## 2024-05-25 DIAGNOSIS — I129 Hypertensive chronic kidney disease with stage 1 through stage 4 chronic kidney disease, or unspecified chronic kidney disease: Secondary | ICD-10-CM | POA: Diagnosis present

## 2024-05-25 DIAGNOSIS — K219 Gastro-esophageal reflux disease without esophagitis: Secondary | ICD-10-CM | POA: Diagnosis present

## 2024-05-25 DIAGNOSIS — E11649 Type 2 diabetes mellitus with hypoglycemia without coma: Secondary | ICD-10-CM | POA: Diagnosis not present

## 2024-05-25 DIAGNOSIS — E66811 Obesity, class 1: Secondary | ICD-10-CM | POA: Diagnosis present

## 2024-05-25 DIAGNOSIS — Z8249 Family history of ischemic heart disease and other diseases of the circulatory system: Secondary | ICD-10-CM | POA: Diagnosis not present

## 2024-05-25 DIAGNOSIS — E876 Hypokalemia: Secondary | ICD-10-CM | POA: Diagnosis present

## 2024-05-25 DIAGNOSIS — D509 Iron deficiency anemia, unspecified: Secondary | ICD-10-CM | POA: Diagnosis present

## 2024-05-25 DIAGNOSIS — N179 Acute kidney failure, unspecified: Secondary | ICD-10-CM | POA: Diagnosis present

## 2024-05-25 DIAGNOSIS — R109 Unspecified abdominal pain: Secondary | ICD-10-CM | POA: Diagnosis present

## 2024-05-25 DIAGNOSIS — Z6831 Body mass index (BMI) 31.0-31.9, adult: Secondary | ICD-10-CM | POA: Diagnosis not present

## 2024-05-25 DIAGNOSIS — Z794 Long term (current) use of insulin: Secondary | ICD-10-CM | POA: Diagnosis not present

## 2024-05-25 DIAGNOSIS — I4510 Unspecified right bundle-branch block: Secondary | ICD-10-CM | POA: Diagnosis present

## 2024-05-25 DIAGNOSIS — D519 Vitamin B12 deficiency anemia, unspecified: Secondary | ICD-10-CM | POA: Diagnosis present

## 2024-05-25 DIAGNOSIS — D631 Anemia in chronic kidney disease: Secondary | ICD-10-CM | POA: Diagnosis present

## 2024-05-25 DIAGNOSIS — N1832 Chronic kidney disease, stage 3b: Secondary | ICD-10-CM | POA: Diagnosis present

## 2024-05-25 LAB — GLUCOSE, CAPILLARY
Glucose-Capillary: 103 mg/dL — ABNORMAL HIGH (ref 70–99)
Glucose-Capillary: 110 mg/dL — ABNORMAL HIGH (ref 70–99)
Glucose-Capillary: 165 mg/dL — ABNORMAL HIGH (ref 70–99)
Glucose-Capillary: 175 mg/dL — ABNORMAL HIGH (ref 70–99)
Glucose-Capillary: 181 mg/dL — ABNORMAL HIGH (ref 70–99)
Glucose-Capillary: 83 mg/dL (ref 70–99)

## 2024-05-25 LAB — HIV ANTIBODY (ROUTINE TESTING W REFLEX): HIV Screen 4th Generation wRfx: NONREACTIVE

## 2024-05-25 LAB — BASIC METABOLIC PANEL WITH GFR
Anion gap: 13 (ref 5–15)
Anion gap: 13 (ref 5–15)
BUN: 43 mg/dL — ABNORMAL HIGH (ref 6–20)
BUN: 39 mg/dL — ABNORMAL HIGH (ref 6–20)
CO2: 10 mmol/L — ABNORMAL LOW (ref 22–32)
CO2: 12 mmol/L — ABNORMAL LOW (ref 22–32)
Calcium: 7.4 mg/dL — ABNORMAL LOW (ref 8.9–10.3)
Calcium: 7.5 mg/dL — ABNORMAL LOW (ref 8.9–10.3)
Chloride: 109 mmol/L (ref 98–111)
Chloride: 105 mmol/L (ref 98–111)
Creatinine, Ser: 2.91 mg/dL — ABNORMAL HIGH (ref 0.44–1.00)
Creatinine, Ser: 2.83 mg/dL — ABNORMAL HIGH (ref 0.44–1.00)
GFR, Estimated: 20 mL/min — ABNORMAL LOW (ref >60)
GFR, Estimated: 20 mL/min — ABNORMAL LOW (ref >60)
Glucose, Bld: 173 mg/dL — ABNORMAL HIGH (ref 70–99)
Glucose, Bld: 170 mg/dL — ABNORMAL HIGH (ref 70–99)
Potassium: 2.9 mmol/L — ABNORMAL LOW (ref 3.5–5.1)
Potassium: 2.9 mmol/L — ABNORMAL LOW (ref 3.5–5.1)
Sodium: 132 mmol/L — ABNORMAL LOW (ref 135–145)
Sodium: 130 mmol/L — ABNORMAL LOW (ref 135–145)

## 2024-05-25 LAB — GASTROINTESTINAL PANEL BY PCR, STOOL (REPLACES STOOL CULTURE)

## 2024-05-25 LAB — CBC
HCT: 30.9 % — ABNORMAL LOW (ref 36.0–46.0)
Hemoglobin: 9.9 g/dL — ABNORMAL LOW (ref 12.0–15.0)
MCH: 28.1 pg (ref 26.0–34.0)
MCHC: 32 g/dL (ref 30.0–36.0)
MCV: 87.8 fL (ref 80.0–100.0)
Platelets: 210 10*3/uL (ref 150–400)
RBC: 3.52 MIL/uL — ABNORMAL LOW (ref 3.87–5.11)
RDW: 14.5 % (ref 11.5–15.5)
WBC: 4.2 10*3/uL (ref 4.0–10.5)
nRBC: 0.7 % — ABNORMAL HIGH (ref 0.0–0.2)

## 2024-05-25 LAB — HEMOGLOBIN A1C
Hgb A1c MFr Bld: 8.1 % — ABNORMAL HIGH (ref 4.8–5.6)
Mean Plasma Glucose: 185.77 mg/dL

## 2024-05-25 LAB — C DIFFICILE QUICK SCREEN W PCR REFLEX
C Diff antigen: NEGATIVE
C Diff interpretation: NOT DETECTED
C Diff toxin: NEGATIVE

## 2024-05-25 MED ORDER — SODIUM CHLORIDE 0.9 % IV SOLN
1.0000 g | INTRAVENOUS | Status: DC
  Administered 2024-05-25 – 2024-05-26 (×2): 1 g via INTRAVENOUS
  Filled 2024-05-25 (×2): qty 10

## 2024-05-25 MED ORDER — POTASSIUM CHLORIDE 20 MEQ PO PACK
40.0000 meq | PACK | ORAL | Status: AC
  Administered 2024-05-25 (×2): 40 meq via ORAL
  Filled 2024-05-25 (×2): qty 2

## 2024-05-25 MED ORDER — SODIUM CHLORIDE 0.45 % IV SOLN
INTRAVENOUS | Status: DC
  Filled 2024-05-25 (×3): qty 75

## 2024-05-25 MED ORDER — POTASSIUM CHLORIDE 20 MEQ PO PACK
40.0000 meq | PACK | Freq: Once | ORAL | Status: AC
  Administered 2024-05-25: 40 meq via ORAL
  Filled 2024-05-25: qty 2

## 2024-05-25 NOTE — Plan of Care (Signed)

## 2024-05-25 NOTE — Progress Notes (Addendum)
 Progress Note   Patient: Virginia [name] NWG:[id number] DOB: 09-11-1976 DOA: 05/24/2024     0 DOS: the patient was seen and examined on 05/25/2024   Brief hospital course: HPI from H&P  Virginia [name] is a 48 y.o. year old female with past medical history of CKD stage IIIb with baseline creatinine 1.3-1.7, type 2 diabetes mellitus, generalized anxiety disorder, allergic rhinitis, anemia of chronic disease with baseline hemoglobin 10-12, GERD, and prior CVA with no reported deficits.  She presents to [facility] ED with 3 to 4 days of nausea, vomiting, and diarrhea. She also endorses poor p.o. intake over that same time interval.  Vomiting is nonbloody, nonbilious and diarrhea is described as loose and mucousy.  Also of note patient reports eating a lot of tomatoes over the last week, this is relevant in the setting of recent FDA class I recall of tomatoes for possible Salmonella contaminant.  She denies any family members having similar symptoms and has not eaten anything abnormal for her. No recent travel. She endorses fatigue, generalized weakness, and abdominal pain.  Denies fevers, chills, or dysuria.   Assessment and Plan:  Salmonella enteritis Likely result of contaminated tomatoes.  Patient with poorly controlled diabetes. -Will start IV ceftriaxone  - Clear liquid diet  Acute kidney injury on CKD stae 3a Metabolic acidosis Unclear baseline serum creatinine.  AKI is likely secondary to GI losses and dehydration. Most recently in 1.2 range.  Up to 3.45 this admission, improving with IV fluids. Bicarb improving. Acidosis likely due to GI bicarb losses as well as renal dysfunction.  - Continue IV fluids, daily BMPs - If bicarb remains low in the a.m. after sodium bicarb infusion will consult nephrology     Latest Ref Rng & Units 05/25/2024    1:36 PM 05/25/2024    5:29 AM 05/24/2024   10:38 AM  BMP  Glucose 70 - 99 mg/dL 578  469  629   BUN 6 - 20 mg/dL 39  43  53    Creatinine 0.44 - 1.00 mg/dL 5.28  4.13  2.44   Sodium 135 - 145 mmol/L 130  132  132   Potassium 3.5 - 5.1 mmol/L 2.9  2.9  3.2   Chloride 98 - 111 mmol/L 105  109  107   CO2 22 - 32 mmol/L 12  10  10    Calcium  8.9 - 10.3 mg/dL 7.5  7.4  7.4    Hypokalemia Due to GI losses Supplement   Type 2 diabetes Poorly controlled A1c 9.7 in February 2025.  On Lantus  13 units daily at home. Continue with Lantus  6 units nightly CBG (last 3)  Recent Labs    05/25/24 0754 05/25/24 1152 05/25/24 1600  GLUCAP 103* 175* 110*     GERD Continue Protonix   Normocytic anemia H/o Fe deficiency     Latest Ref Rng & Units 05/25/2024    5:29 AM 05/24/2024   10:38 AM 05/24/2024    1:49 AM  CBC  WBC 4.0 - 10.5 K/uL 4.2  3.5  4.1   Hemoglobin 12.0 - 15.0 g/dL 9.9  01.0  27.2   Hematocrit 36.0 - 46.0 % 30.9  35.8  32.9   Platelets 150 - 400 K/uL 210  236  242    Iron/TIBC/Ferritin/ %Sat    Component Value Date/Time   IRON 40 02/05/2024 0741   IRON 28 (L) 07/09/2012 1936   TIBC 165 (L) 02/05/2024 0741   TIBC 203 (L) 07/09/2012 1936   FERRITIN  73 02/05/2024 0741   FERRITIN 4 (L) 07/09/2012 1936   IRONPCTSAT 24 02/05/2024 0741   IRONPCTSAT 14 07/09/2012 1936  Check iron panel, B12, folate  GAD Continue as needed Xanax  and Prozac       Subjective: Continues to have frequent bowel movements no emesis. Urinating.   Physical Exam: Vitals:   05/25/24 0755 05/25/24 0852 05/25/24 0943 05/25/24 1601  BP: (!) 148/70   (!) 156/94  Pulse: 91   95  Resp:  18 18   Temp: 98.2 F (36.8 C)   98.3 F (36.8 C)  TempSrc: Oral   Oral  SpO2: 100% 96%  98%  Weight:      Height:       Physical Exam  Constitutional: In no distress.  Cardiovascular: Normal rate, regular rhythm. No lower extremity edema  Pulmonary: Non labored breathing on room air, no wheezing or rales.   Abdominal: Soft. Non distended and non tender Musculoskeletal: Normal range of motion.     Neurological: Alert and oriented  to person, place, and time. Non focal  Skin: Skin is warm and dry.   Data Reviewed:     Latest Ref Rng & Units 05/25/2024    1:36 PM 05/25/2024    5:29 AM 05/24/2024   10:38 AM  BMP  Glucose 70 - 99 mg/dL 956  213  086   BUN 6 - 20 mg/dL 39  43  53   Creatinine 0.44 - 1.00 mg/dL 5.78  4.69  6.29   Sodium 135 - 145 mmol/L 130  132  132   Potassium 3.5 - 5.1 mmol/L 2.9  2.9  3.2   Chloride 98 - 111 mmol/L 105  109  107   CO2 22 - 32 mmol/L 12  10  10    Calcium  8.9 - 10.3 mg/dL 7.5  7.4  7.4       Latest Ref Rng & Units 05/25/2024    5:29 AM 05/24/2024   10:38 AM 05/24/2024    1:49 AM  CBC  WBC 4.0 - 10.5 K/uL 4.2  3.5  4.1   Hemoglobin 12.0 - 15.0 g/dL 9.9  52.8  41.3   Hematocrit 36.0 - 46.0 % 30.9  35.8  32.9   Platelets 150 - 400 K/uL 210  236  242      Family Communication: Discussed with patient and she noted understanding.   Disposition: Status is: Inpatient Remains inpatient appropriate because: acute renal dysfunction and frequent BMs  Planned Discharge Destination: Home    Time spent: 35 minutes  Author: Joette Mustard, MD 05/25/2024 7:20 PM  For on call review [email or web address].

## 2024-05-25 NOTE — Progress Notes (Signed)
   05/25/24 1000  Spiritual Encounters  Type of Visit Initial;Follow up  Care provided to: Patient  Reason for visit Advance directives  OnCall Visit No   Chaplain responded to consult request for Advance Care Directives. Chaplain visited the patient twice yesterday. She was in severe stomach pain. Chaplain stayed for a while in the morning, held her hand, provided support while she was in pain. Then, visited her in the afternoon but she was sleeping.  Chaplain made another follow up visit today. She was still in pain and stated that she doesn't feel to listen and understand the document today. Chaplain will complete the consult. Please page chaplain's phone or put the consult if she is able to get the education of ACD document. Chaplain will be available when patient is ready to do it.    M.Kubra Welby Hale Resident [phone]

## 2024-05-26 ENCOUNTER — Inpatient Hospital Stay (HOSPITAL_COMMUNITY)

## 2024-05-26 DIAGNOSIS — N179 Acute kidney failure, unspecified: Secondary | ICD-10-CM | POA: Diagnosis not present

## 2024-05-26 DIAGNOSIS — A020 Salmonella enteritis: Secondary | ICD-10-CM | POA: Diagnosis not present

## 2024-05-26 LAB — BASIC METABOLIC PANEL WITH GFR
Anion gap: 10 (ref 5–15)
BUN: 29 mg/dL — ABNORMAL HIGH (ref 6–20)
CO2: 14 mmol/L — ABNORMAL LOW (ref 22–32)
Calcium: 7.7 mg/dL — ABNORMAL LOW (ref 8.9–10.3)
Chloride: 110 mmol/L (ref 98–111)
Creatinine, Ser: 2.31 mg/dL — ABNORMAL HIGH (ref 0.44–1.00)
GFR, Estimated: 26 mL/min — ABNORMAL LOW (ref >60)
Glucose, Bld: 114 mg/dL — ABNORMAL HIGH (ref 70–99)
Potassium: 3.1 mmol/L — ABNORMAL LOW (ref 3.5–5.1)
Sodium: 134 mmol/L — ABNORMAL LOW (ref 135–145)

## 2024-05-26 LAB — URINALYSIS, COMPLETE (UACMP) WITH MICROSCOPIC
Bilirubin Urine: NEGATIVE
Glucose, UA: NEGATIVE mg/dL
Hgb urine dipstick: NEGATIVE
Ketones, ur: NEGATIVE mg/dL
Leukocytes,Ua: NEGATIVE
Nitrite: NEGATIVE
Protein, ur: 100 mg/dL — AB
Specific Gravity, Urine: 1.012 (ref 1.005–1.030)
pH: 6 (ref 5.0–8.0)

## 2024-05-26 LAB — CBC
HCT: 26.5 % — ABNORMAL LOW (ref 36.0–46.0)
Hemoglobin: 9 g/dL — ABNORMAL LOW (ref 12.0–15.0)
MCH: 28.2 pg (ref 26.0–34.0)
MCHC: 34 g/dL (ref 30.0–36.0)
MCV: 83.1 fL (ref 80.0–100.0)
Platelets: 255 10*3/uL (ref 150–400)
RBC: 3.19 MIL/uL — ABNORMAL LOW (ref 3.87–5.11)
RDW: 14.5 % (ref 11.5–15.5)
WBC: 4.7 10*3/uL (ref 4.0–10.5)
nRBC: 0 % (ref 0.0–0.2)

## 2024-05-26 LAB — IRON AND TIBC
Iron: 11 ug/dL — ABNORMAL LOW (ref 28–170)
Saturation Ratios: 5 % — ABNORMAL LOW (ref 10.4–31.8)
TIBC: 207 ug/dL — ABNORMAL LOW (ref 250–450)
UIBC: 196 ug/dL

## 2024-05-26 LAB — GLUCOSE, CAPILLARY
Glucose-Capillary: 105 mg/dL — ABNORMAL HIGH (ref 70–99)
Glucose-Capillary: 122 mg/dL — ABNORMAL HIGH (ref 70–99)
Glucose-Capillary: 125 mg/dL — ABNORMAL HIGH (ref 70–99)
Glucose-Capillary: 128 mg/dL — ABNORMAL HIGH (ref 70–99)
Glucose-Capillary: 137 mg/dL — ABNORMAL HIGH (ref 70–99)
Glucose-Capillary: 80 mg/dL (ref 70–99)

## 2024-05-26 LAB — VITAMIN B12: Vitamin B-12: 273 pg/mL (ref 180–914)

## 2024-05-26 LAB — FOLATE: Folate: 14.9 ng/mL (ref >5.9)

## 2024-05-26 LAB — FERRITIN: Ferritin: 51 ng/mL (ref 11–307)

## 2024-05-26 MED ORDER — POTASSIUM CHLORIDE 20 MEQ PO PACK: 40.0000 meq | PACK | ORAL | Status: DC

## 2024-05-26 MED ORDER — SODIUM CHLORIDE 0.9 % IV SOLN
200.0000 mg | Freq: Once | INTRAVENOUS | Status: AC
  Administered 2024-05-26: 200 mg via INTRAVENOUS
  Filled 2024-05-26: qty 10

## 2024-05-26 MED ORDER — VITAMIN B-12 100 MCG PO TABS
500.0000 ug | ORAL_TABLET | Freq: Every day | ORAL | Status: DC
  Administered 2024-05-27: 500 ug via ORAL
  Filled 2024-05-26: qty 5

## 2024-05-26 MED ORDER — IRON SUCROSE 200 MG IVPB - SIMPLE MED
200.0000 mg | Freq: Once | Status: DC
  Filled 2024-05-26: qty 110

## 2024-05-26 MED ORDER — POTASSIUM CHLORIDE 20 MEQ PO PACK
40.0000 meq | PACK | Freq: Once | ORAL | Status: AC
  Administered 2024-05-26: 40 meq via ORAL
  Filled 2024-05-26: qty 2

## 2024-05-26 MED ORDER — STERILE WATER FOR INJECTION IV SOLN
INTRAVENOUS | Status: AC
  Filled 2024-05-26 (×2): qty 1000

## 2024-05-26 MED ORDER — ONDANSETRON HCL 4 MG/2ML IJ SOLN
4.0000 mg | Freq: Four times a day (QID) | INTRAMUSCULAR | Status: DC | PRN
  Administered 2024-05-26 – 2024-05-28 (×6): 4 mg via INTRAVENOUS
  Filled 2024-05-26 (×6): qty 2

## 2024-05-26 MED ORDER — HYDROMORPHONE HCL 1 MG/ML IJ SOLN
1.0000 mg | INTRAMUSCULAR | Status: DC | PRN
  Administered 2024-05-26 – 2024-05-29 (×16): 1 mg via INTRAVENOUS
  Filled 2024-05-26 (×16): qty 1

## 2024-05-26 MED ORDER — PROCHLORPERAZINE EDISYLATE 10 MG/2ML IJ SOLN: 5.0000 mg | Freq: Four times a day (QID) | INTRAMUSCULAR | Status: DC | PRN

## 2024-05-26 NOTE — Plan of Care (Signed)

## 2024-05-26 NOTE — Progress Notes (Signed)
 [facility] night cross cover note:   I was notified by the patient's RN of the patient's request for Zofran  for nausea. Per night RN, there was some concern on day shift that compazine  may be causing some confusion for this patient.   I subsequently added prn IV Zofran  for nausea.     Camelia Cavalier, DO Hospitalist

## 2024-05-26 NOTE — Progress Notes (Addendum)
 Progress Note   Patient: Virginia [name] ZOX:[id number] DOB: 06-May-1976 DOA: 05/24/2024     1 DOS: the patient was seen and examined on 05/26/2024   Brief hospital course: HPI from H&P  Virginia [name] is a 48 y.o. year old female with past medical history of CKD stage IIIb with baseline creatinine 1.3-1.7, type 2 diabetes mellitus, generalized anxiety disorder, allergic rhinitis, anemia of chronic disease with baseline hemoglobin 10-12, GERD, and prior CVA with no reported deficits.  She presents to [facility] with 3 to 4 days of nausea, vomiting, and diarrhea. She also endorses poor p.o. intake over that same time interval.  Vomiting is nonbloody, nonbilious and diarrhea is described as loose and mucousy.  Also of note patient reports eating a lot of tomatoes over the last week, this is relevant in the setting of recent FDA class I recall of tomatoes for possible Salmonella contaminant.  She denies any family members having similar symptoms and has not eaten anything abnormal for her. No recent travel. She endorses fatigue, generalized weakness, and abdominal pain.  Denies fevers, chills, or dysuria.   Assessment and Plan:  Salmonella enteritis Likely result of ingesting contaminated tomatoes.  Multiple stools. Poorly controlled diabetes. Bcx NGTD. Had fever to 100.5 overnight.  -Continue IV ceftriaxone  s/p 3d, transition to PO if no further fever. Fluoroquinolone for total 5-7 d course -Start imodium  once afebrile  -Full liquid diet  Acute kidney injury on CKD stae 3a Improving  Metabolic acidosis Unclear baseline serum creatinine.  AKI is likely secondary to GI losses and dehydration. Most recently in 1.2 range.  Up to 3.45 this admission, improving with IV fluids. Bicarb improving. Acidosis likely due to GI bicarb losses as well as baseline renal dysfunction.  - Continue IV fluids, daily BMPs - Discussed case with nephrology will increase bicarb infusion to 150meq  -Will  likely need to discharge on PO bicarb with close f/u     Latest Ref Rng & Units 05/26/2024    7:47 AM 05/25/2024    1:36 PM 05/25/2024    5:29 AM  BMP  Glucose 70 - 99 mg/dL 811  914  782   BUN 6 - 20 mg/dL 29  39  43   Creatinine 0.44 - 1.00 mg/dL 9.56  2.13  0.86   Sodium 135 - 145 mmol/L 134  130  132   Potassium 3.5 - 5.1 mmol/L 3.1  2.9  2.9   Chloride 98 - 111 mmol/L 110  105  109   CO2 22 - 32 mmol/L 14  12  10    Calcium  8.9 - 10.3 mg/dL 7.7  7.5  7.4     Hyponatremia (improving) Likely secodary to hypovolemia in the setting of salmonella enteritis.   Hypokalemia Due to GI losses Supplement  Costovertebral tenderness Patient reported some R sided CVA tenderness. UA without concern for infection. Possibly MSK, with frequent emesis prior to admission and frequent stools.   ON ceftriaxone . Will check renal US  in the AM.   Type 2 diabetes Poorly controlled A1c 9.7 in February 2025.  A1c 8.1 this hospitalization.On Lantus  13 units daily at home. Continue with Lantus  6 units nightly CBG (last 3)  Recent Labs    05/26/24 0758 05/26/24 1130 05/26/24 1614  GLUCAP 137* 128* 105*    GERD Continue Protonix   Normocytic anemia H/o Fe deficiency (has menses)    Latest Ref Rng & Units 05/26/2024    7:47 AM 05/25/2024    5:29 AM 05/24/2024  10:38 AM  CBC  WBC 4.0 - 10.5 K/uL 4.7  4.2  3.5   Hemoglobin 12.0 - 15.0 g/dL 9.0  9.9  46.9   Hematocrit 36.0 - 46.0 % 26.5  30.9  35.8   Platelets 150 - 400 K/uL 255  210  236    Iron/TIBC/Ferritin/ %Sat    Component Value Date/Time   IRON 11 (L) 05/26/2024 0747   IRON 28 (L) 07/09/2012 1936   TIBC 207 (L) 05/26/2024 0747   TIBC 203 (L) 07/09/2012 1936   FERRITIN 51 05/26/2024 0747   FERRITIN 4 (L) 07/09/2012 1936   IRONPCTSAT 5 (L) 05/26/2024 0747   IRONPCTSAT 14 07/09/2012 1936  B12 borderline 278. Start iron and B12 supplementation   GAD Continue as needed Xanax  and Prozac       Subjective: Continues to have  frequent bowel movements. Has flexiseal.   No N/V. Has an appetite would like diet advanced.   Physical Exam: Vitals:   05/25/24 2040 05/26/24 0445 05/26/24 0759 05/26/24 1610  BP: (!) 146/81 130/82 135/81 114/71  Pulse: 83 81 79 73  Resp: 18 18 13    Temp: (!) 100.5 F (38.1 C) 99 F (37.2 C) 99.1 F (37.3 C) 98.5 F (36.9 C)  TempSrc: Oral Oral Oral Oral  SpO2: 96% 100% 100% 100%  Weight:      Height:        Physical Exam  Constitutional: In no distress.  Cardiovascular: Normal rate, regular rhythm. No lower extremity edema  Pulmonary: Non labored breathing on room air, no wheezing or rales.  Abdominal: Soft.Non distended and non tender Musculoskeletal: Normal range of motion.  R sided CVA tenderness mild   Neurological: Alert and oriented to person, place, and time. Non focal  Skin: Skin is warm and dry.  Flexiseal in place, dark liquid stool    Data Reviewed:     Latest Ref Rng & Units 05/26/2024    7:47 AM 05/25/2024    1:36 PM 05/25/2024    5:29 AM  BMP  Glucose 70 - 99 mg/dL 629  528  413   BUN 6 - 20 mg/dL 29  39  43   Creatinine 0.44 - 1.00 mg/dL 2.44  0.10  2.72   Sodium 135 - 145 mmol/L 134  130  132   Potassium 3.5 - 5.1 mmol/L 3.1  2.9  2.9   Chloride 98 - 111 mmol/L 110  105  109   CO2 22 - 32 mmol/L 14  12  10    Calcium  8.9 - 10.3 mg/dL 7.7  7.5  7.4       Latest Ref Rng & Units 05/26/2024    7:47 AM 05/25/2024    5:29 AM 05/24/2024   10:38 AM  CBC  WBC 4.0 - 10.5 K/uL 4.7  4.2  3.5   Hemoglobin 12.0 - 15.0 g/dL 9.0  9.9  53.6   Hematocrit 36.0 - 46.0 % 26.5  30.9  35.8   Platelets 150 - 400 K/uL 255  210  236      Family Communication: Discussed with family at bedside   Disposition: Status is: Inpatient Remains inpatient appropriate because: acute renal dysfunction and frequent BMs  Planned Discharge Destination: Home    Time spent: 35 minutes  Author: Joette Mustard, MD 05/26/2024 8:59 PM  For on call review [email or web address].

## 2024-05-27 ENCOUNTER — Inpatient Hospital Stay (HOSPITAL_COMMUNITY)

## 2024-05-27 DIAGNOSIS — N179 Acute kidney failure, unspecified: Secondary | ICD-10-CM | POA: Diagnosis not present

## 2024-05-27 LAB — BLOOD CULTURE ID PANEL (REFLEXED) - BCID2

## 2024-05-27 LAB — BASIC METABOLIC PANEL WITH GFR
Anion gap: 10 (ref 5–15)
BUN: 22 mg/dL — ABNORMAL HIGH (ref 6–20)
CO2: 20 mmol/L — ABNORMAL LOW (ref 22–32)
Calcium: 7.6 mg/dL — ABNORMAL LOW (ref 8.9–10.3)
Chloride: 106 mmol/L (ref 98–111)
Creatinine, Ser: 2.19 mg/dL — ABNORMAL HIGH (ref 0.44–1.00)
GFR, Estimated: 27 mL/min — ABNORMAL LOW (ref >60)
Glucose, Bld: 98 mg/dL (ref 70–99)
Potassium: 3 mmol/L — ABNORMAL LOW (ref 3.5–5.1)
Sodium: 136 mmol/L (ref 135–145)

## 2024-05-27 LAB — RENAL FUNCTION PANEL
Albumin: 2.1 g/dL — ABNORMAL LOW (ref 3.5–5.0)
Anion gap: 11 (ref 5–15)
BUN: 22 mg/dL — ABNORMAL HIGH (ref 6–20)
CO2: 18 mmol/L — ABNORMAL LOW (ref 22–32)
Calcium: 7.8 mg/dL — ABNORMAL LOW (ref 8.9–10.3)
Chloride: 104 mmol/L (ref 98–111)
Creatinine, Ser: 2.19 mg/dL — ABNORMAL HIGH (ref 0.44–1.00)
GFR, Estimated: 27 mL/min — ABNORMAL LOW (ref >60)
Glucose, Bld: 151 mg/dL — ABNORMAL HIGH (ref 70–99)
Phosphorus: 3.1 mg/dL (ref 2.5–4.6)
Potassium: 2.8 mmol/L — ABNORMAL LOW (ref 3.5–5.1)
Sodium: 133 mmol/L — ABNORMAL LOW (ref 135–145)

## 2024-05-27 LAB — CBC
HCT: 31.4 % — ABNORMAL LOW (ref 36.0–46.0)
Hemoglobin: 10.8 g/dL — ABNORMAL LOW (ref 12.0–15.0)
MCH: 28.5 pg (ref 26.0–34.0)
MCHC: 34.4 g/dL (ref 30.0–36.0)
MCV: 82.8 fL (ref 80.0–100.0)
Platelets: 309 10*3/uL (ref 150–400)
RBC: 3.79 MIL/uL — ABNORMAL LOW (ref 3.87–5.11)
RDW: 14.7 % (ref 11.5–15.5)
WBC: 5.5 10*3/uL (ref 4.0–10.5)
nRBC: 0 % (ref 0.0–0.2)

## 2024-05-27 LAB — GLUCOSE, CAPILLARY
Glucose-Capillary: 103 mg/dL — ABNORMAL HIGH (ref 70–99)
Glucose-Capillary: 117 mg/dL — ABNORMAL HIGH (ref 70–99)
Glucose-Capillary: 129 mg/dL — ABNORMAL HIGH (ref 70–99)
Glucose-Capillary: 133 mg/dL — ABNORMAL HIGH (ref 70–99)
Glucose-Capillary: 145 mg/dL — ABNORMAL HIGH (ref 70–99)
Glucose-Capillary: 76 mg/dL (ref 70–99)
Glucose-Capillary: 99 mg/dL (ref 70–99)

## 2024-05-27 LAB — MAGNESIUM: Magnesium: 1.7 mg/dL (ref 1.7–2.4)

## 2024-05-27 MED ORDER — SODIUM CHLORIDE 0.9 % IV SOLN
2.0000 g | INTRAVENOUS | Status: AC
  Administered 2024-05-27 – 2024-05-29 (×3): 2 g via INTRAVENOUS
  Filled 2024-05-27 (×4): qty 20

## 2024-05-27 MED ORDER — ENSURE ENLIVE PO LIQD
237.0000 mL | Freq: Two times a day (BID) | ORAL | Status: DC
  Administered 2024-05-27 – 2024-05-29 (×5): 237 mL via ORAL
  Filled 2024-05-27 (×8): qty 237

## 2024-05-27 MED ORDER — CYANOCOBALAMIN 1000 MCG/ML IJ SOLN
1000.0000 ug | INTRAMUSCULAR | Status: DC
  Administered 2024-05-27: 1000 ug via SUBCUTANEOUS
  Filled 2024-05-27: qty 1

## 2024-05-27 MED ORDER — ZINC SULFATE 220 (50 ZN) MG PO CAPS
220.0000 mg | ORAL_CAPSULE | Freq: Every day | ORAL | Status: DC
  Administered 2024-05-27 – 2024-05-30 (×4): 220 mg via ORAL
  Filled 2024-05-27 (×4): qty 1

## 2024-05-27 MED ORDER — MAGNESIUM SULFATE 2 GM/50ML IV SOLN
2.0000 g | Freq: Once | INTRAVENOUS | Status: AC
  Administered 2024-05-27: 2 g via INTRAVENOUS
  Filled 2024-05-27: qty 50

## 2024-05-27 MED ORDER — POTASSIUM CHLORIDE 10 MEQ/100ML IV SOLN
10.0000 meq | INTRAVENOUS | Status: DC
  Administered 2024-05-27 (×2): 10 meq via INTRAVENOUS
  Filled 2024-05-27 (×2): qty 100

## 2024-05-27 MED ORDER — SACCHAROMYCES BOULARDII 250 MG PO CAPS
250.0000 mg | ORAL_CAPSULE | Freq: Two times a day (BID) | ORAL | Status: DC
  Administered 2024-05-27 – 2024-05-30 (×7): 250 mg via ORAL
  Filled 2024-05-27 (×7): qty 1

## 2024-05-27 MED ORDER — INSULIN ASPART 100 UNIT/ML IJ SOLN
0.0000 [IU] | Freq: Three times a day (TID) | INTRAMUSCULAR | Status: DC
  Administered 2024-05-27: 2 [IU] via SUBCUTANEOUS

## 2024-05-27 MED ORDER — POTASSIUM CHLORIDE 10 MEQ/100ML IV SOLN
10.0000 meq | INTRAVENOUS | Status: AC
  Administered 2024-05-27 – 2024-05-28 (×4): 10 meq via INTRAVENOUS
  Filled 2024-05-27 (×4): qty 100

## 2024-05-27 MED ORDER — POTASSIUM CHLORIDE 20 MEQ PO PACK
40.0000 meq | PACK | Freq: Once | ORAL | Status: AC
  Administered 2024-05-27: 40 meq via ORAL
  Filled 2024-05-27: qty 2

## 2024-05-27 MED ORDER — INSULIN ASPART 100 UNIT/ML IJ SOLN: 0.0000 [IU] | Freq: Every day | INTRAMUSCULAR | Status: DC

## 2024-05-27 MED ORDER — PROCHLORPERAZINE EDISYLATE 10 MG/2ML IJ SOLN
10.0000 mg | Freq: Four times a day (QID) | INTRAMUSCULAR | Status: DC | PRN
  Administered 2024-05-28: 10 mg via INTRAVENOUS
  Filled 2024-05-27: qty 2

## 2024-05-27 NOTE — Progress Notes (Signed)
 PHARMACY - PHYSICIAN COMMUNICATION CRITICAL VALUE ALERT - BLOOD CULTURE IDENTIFICATION (BCID)  Virginia [name] is an 48 y.o. female who presented to [facility] on 05/24/2024 with a chief complaint of N/V/D   Name of physician (or Provider) Contacted: Dr. Brock Canner  Current antibiotics: Ceftriaxone   Changes to prescribed antibiotics recommended:  No changes  Results for orders placed or performed during the hospital encounter of 05/24/24  Blood Culture ID Panel (Reflexed) (Collected: 05/25/2024  1:35 PM)  Result Value Ref Range   Enterococcus faecalis NOT DETECTED NOT DETECTED   Enterococcus Faecium NOT DETECTED NOT DETECTED   Listeria monocytogenes NOT DETECTED NOT DETECTED   Staphylococcus species DETECTED (A) NOT DETECTED   Staphylococcus aureus (BCID) NOT DETECTED NOT DETECTED   Staphylococcus epidermidis NOT DETECTED NOT DETECTED   Staphylococcus lugdunensis NOT DETECTED NOT DETECTED   Streptococcus species NOT DETECTED NOT DETECTED   Streptococcus agalactiae NOT DETECTED NOT DETECTED   Streptococcus pneumoniae NOT DETECTED NOT DETECTED   Streptococcus pyogenes NOT DETECTED NOT DETECTED   A.calcoaceticus-baumannii NOT DETECTED NOT DETECTED   Bacteroides fragilis NOT DETECTED NOT DETECTED   Enterobacterales NOT DETECTED NOT DETECTED   Enterobacter cloacae complex NOT DETECTED NOT DETECTED   Escherichia coli NOT DETECTED NOT DETECTED   Klebsiella aerogenes NOT DETECTED NOT DETECTED   Klebsiella oxytoca NOT DETECTED NOT DETECTED   Klebsiella pneumoniae NOT DETECTED NOT DETECTED   Proteus species NOT DETECTED NOT DETECTED   Salmonella species NOT DETECTED NOT DETECTED   Serratia marcescens NOT DETECTED NOT DETECTED   Haemophilus influenzae NOT DETECTED NOT DETECTED   Neisseria meningitidis NOT DETECTED NOT DETECTED   Pseudomonas aeruginosa NOT DETECTED NOT DETECTED   Stenotrophomonas maltophilia NOT DETECTED NOT DETECTED   Candida albicans NOT DETECTED NOT DETECTED    Candida auris NOT DETECTED NOT DETECTED   Candida glabrata NOT DETECTED NOT DETECTED   Candida krusei NOT DETECTED NOT DETECTED   Candida parapsilosis NOT DETECTED NOT DETECTED   Candida tropicalis NOT DETECTED NOT DETECTED   Cryptococcus neoformans/gattii NOT DETECTED NOT DETECTED    Silvestre Drum 05/27/2024  4:10 AM

## 2024-05-27 NOTE — Plan of Care (Signed)

## 2024-05-27 NOTE — Progress Notes (Signed)
 [facility] Progress Note Patient: Virginia [name] AVW:[id number] DOB: [birth year] DOA: 05/24/2024  DOS: the patient was seen and examined on 05/27/2024  Brief Hospital Course: PMH of CKD 3B, type II DM, anxiety, anemia, GERD presented to the hospital with complaints of diarrhea. Currently being treated for Salmonella enteritis.  Assessment and Plan: Severe Salmonella enteritis. Receiving IV antibiotics which I will continue. Continue with IV fluid. Keep n.p.o. given frequent nausea and vomiting. X-ray abdomen reassuring.  Acute kidney injury on CKD 3A. Receiving IV hydration. Monitor.  Metabolic acidosis. In the setting of diarrhea. Receiving bicarb infusion which I will continue.  Mild hyponatremia. Severe hypokalemia. Hypomagnesemia. Continue to replace.  GERD. Continue PPI and IV form.  Diabetes mellitus, uncontrolled. On sliding scale insulin . Will monitor   Nutrition. Patient has not been able to eat well secondary to ongoing nausea and vomiting. May require TPN. Will monitor.  Subjective: Ongoing diarrhea.  Reports multiple episodes of vomiting.  No abdominal pain.  Physical Exam: General: in Mild distress, No Rash Cardiovascular: S1 and S2 Present, No Murmur Respiratory: Good respiratory effort, Bilateral Air entry present. No Crackles, No wheezes Abdomen: Bowel Sound present, mild right-sided tenderness Extremities: No edema Neuro: Alert and oriented x3, no new focal deficit  Data Reviewed: I have Reviewed nursing notes, Vitals, and Lab results. Since last encounter, pertinent lab results CBC and BMP   . I have ordered test including CBC and BMP  . I have ordered imaging x-ray abdomen  .   Disposition: Status is: Inpatient Remains inpatient appropriate because: Monitor for improvement in diarrhea  heparin  injection 5,000 Units Start: 05/24/24 0815   Family Communication: No one at bedside Level of care: Med-Surg   Vitals:   05/26/24 1610  05/26/24 2101 05/27/24 0454 05/27/24 0854  BP: 114/71 125/81 113/69 (!) 169/88  Pulse: 73 72 62 76  Resp:      Temp: 98.5 F (36.9 C) 98.5 F (36.9 C) 98 F (36.7 C) 98.1 F (36.7 C)  TempSrc: Oral Oral Oral Oral  SpO2: 100% 100% 100% 100%  Weight:      Height:         Author: Charlean Congress, MD 05/27/2024 7:32 PM  Please look on [email or web address] to find out who is on call.

## 2024-05-28 DIAGNOSIS — N179 Acute kidney failure, unspecified: Secondary | ICD-10-CM | POA: Diagnosis not present

## 2024-05-28 LAB — GLUCOSE, CAPILLARY
Glucose-Capillary: 101 mg/dL — ABNORMAL HIGH (ref 70–99)
Glucose-Capillary: 131 mg/dL — ABNORMAL HIGH (ref 70–99)
Glucose-Capillary: 179 mg/dL — ABNORMAL HIGH (ref 70–99)
Glucose-Capillary: 63 mg/dL — ABNORMAL LOW (ref 70–99)
Glucose-Capillary: 67 mg/dL — ABNORMAL LOW (ref 70–99)
Glucose-Capillary: 72 mg/dL (ref 70–99)
Glucose-Capillary: 77 mg/dL (ref 70–99)
Glucose-Capillary: 86 mg/dL (ref 70–99)

## 2024-05-28 LAB — COMPREHENSIVE METABOLIC PANEL WITH GFR
ALT: 14 U/L (ref 0–44)
AST: 20 U/L (ref 15–41)
Albumin: 1.9 g/dL — ABNORMAL LOW (ref 3.5–5.0)
Alkaline Phosphatase: 65 U/L (ref 38–126)
Anion gap: 10 (ref 5–15)
BUN: 21 mg/dL — ABNORMAL HIGH (ref 6–20)
CO2: 21 mmol/L — ABNORMAL LOW (ref 22–32)
Calcium: 7.5 mg/dL — ABNORMAL LOW (ref 8.9–10.3)
Chloride: 104 mmol/L (ref 98–111)
Creatinine, Ser: 2.32 mg/dL — ABNORMAL HIGH (ref 0.44–1.00)
GFR, Estimated: 26 mL/min — ABNORMAL LOW (ref >60)
Glucose, Bld: 99 mg/dL (ref 70–99)
Potassium: 3 mmol/L — ABNORMAL LOW (ref 3.5–5.1)
Sodium: 135 mmol/L (ref 135–145)
Total Bilirubin: 0.3 mg/dL (ref 0.0–1.2)
Total Protein: 5.5 g/dL — ABNORMAL LOW (ref 6.5–8.1)

## 2024-05-28 LAB — CBC WITH DIFFERENTIAL/PLATELET
Abs Immature Granulocytes: 0 10*3/uL (ref 0.00–0.07)
Basophils Absolute: 0.1 10*3/uL (ref 0.0–0.1)
Basophils Relative: 1 %
Eosinophils Absolute: 0.1 10*3/uL (ref 0.0–0.5)
Eosinophils Relative: 1 %
HCT: 27.7 % — ABNORMAL LOW (ref 36.0–46.0)
Hemoglobin: 9.5 g/dL — ABNORMAL LOW (ref 12.0–15.0)
Lymphocytes Relative: 33 %
Lymphs Abs: 1.8 10*3/uL (ref 0.7–4.0)
MCH: 28.3 pg (ref 26.0–34.0)
MCHC: 34.3 g/dL (ref 30.0–36.0)
MCV: 82.4 fL (ref 80.0–100.0)
Monocytes Absolute: 0.2 10*3/uL (ref 0.1–1.0)
Monocytes Relative: 4 %
Neutro Abs: 3.3 10*3/uL (ref 1.7–7.7)
Neutrophils Relative %: 61 %
Platelets: 304 10*3/uL (ref 150–400)
RBC: 3.36 MIL/uL — ABNORMAL LOW (ref 3.87–5.11)
RDW: 14.7 % (ref 11.5–15.5)
WBC: 5.4 10*3/uL (ref 4.0–10.5)
nRBC: 0 % (ref 0.0–0.2)
nRBC: 1 /100{WBCs} — ABNORMAL HIGH

## 2024-05-28 LAB — MAGNESIUM: Magnesium: 2.1 mg/dL (ref 1.7–2.4)

## 2024-05-28 LAB — TSH: TSH: 1.603 u[IU]/mL (ref 0.350–4.500)

## 2024-05-28 LAB — AMMONIA: Ammonia: 44 umol/L — ABNORMAL HIGH (ref 9–35)

## 2024-05-28 LAB — T4, FREE: Free T4: 1.2 ng/dL — ABNORMAL HIGH (ref 0.61–1.12)

## 2024-05-28 MED ORDER — DEXTROSE IN LACTATED RINGERS 5 % IV SOLN: INTRAVENOUS | Status: AC

## 2024-05-28 MED ORDER — DEXTROSE 50 % IV SOLN
INTRAVENOUS | Status: AC
  Administered 2024-05-28: 50 mL
  Filled 2024-05-28: qty 50

## 2024-05-28 MED ORDER — POTASSIUM CHLORIDE 20 MEQ PO PACK
40.0000 meq | PACK | Freq: Once | ORAL | Status: AC
  Administered 2024-05-28: 40 meq via ORAL
  Filled 2024-05-28: qty 2

## 2024-05-28 MED ORDER — ORAL CARE MOUTH RINSE: 15.0000 mL | OROMUCOSAL | Status: DC | PRN

## 2024-05-28 MED ORDER — DEXTROSE 50 % IV SOLN: 25.0000 g | INTRAVENOUS | Status: AC

## 2024-05-28 MED ORDER — INSULIN ASPART 100 UNIT/ML IJ SOLN
0.0000 [IU] | INTRAMUSCULAR | Status: DC
  Administered 2024-05-28: 1 [IU] via SUBCUTANEOUS
  Administered 2024-05-28: 2 [IU] via SUBCUTANEOUS
  Administered 2024-05-29: 1 [IU] via SUBCUTANEOUS
  Administered 2024-05-29 (×2): 2 [IU] via SUBCUTANEOUS
  Administered 2024-05-30 (×2): 1 [IU] via SUBCUTANEOUS

## 2024-05-28 MED ORDER — DEXTROSE 50 % IV SOLN
INTRAVENOUS | Status: AC
  Administered 2024-05-28: 12.5 mL
  Filled 2024-05-28: qty 50

## 2024-05-28 MED ORDER — ALPRAZOLAM 0.5 MG PO TABS
0.5000 mg | ORAL_TABLET | Freq: Three times a day (TID) | ORAL | Status: DC | PRN
  Administered 2024-05-28 – 2024-05-30 (×3): 0.5 mg via ORAL
  Filled 2024-05-28 (×3): qty 1

## 2024-05-28 MED ORDER — POTASSIUM CHLORIDE 10 MEQ/100ML IV SOLN
10.0000 meq | INTRAVENOUS | Status: DC
  Administered 2024-05-28: 10 meq via INTRAVENOUS
  Filled 2024-05-28: qty 100

## 2024-05-28 MED ORDER — DEXTROSE 50 % IV SOLN
12.5000 g | INTRAVENOUS | Status: AC
Start: 2024-05-28 — End: 2024-05-28

## 2024-05-28 MED ORDER — PANTOPRAZOLE SODIUM 40 MG PO TBEC
40.0000 mg | DELAYED_RELEASE_TABLET | Freq: Every day | ORAL | Status: DC
  Administered 2024-05-28 – 2024-05-30 (×3): 40 mg via ORAL
  Filled 2024-05-28 (×3): qty 1

## 2024-05-28 NOTE — Progress Notes (Signed)
Hypoglycemic Event  CBG: ***  Treatment: {Hypoglycemic Treatment (must also place order and document administration):3049002}  Symptoms: {Hypoglycemic Symptoms:3049003}  Follow-up CBG: Time:*** CBG Result:***  Possible Reasons for Event: {Possible Reasons for Event:3049004}  Comments/MD notified:***    Virginia [name] [name]

## 2024-05-28 NOTE — Progress Notes (Addendum)
 [facility] Progress Note Patient: Virginia [name] ZHY:[id number] DOB: [birth year] DOA: 05/24/2024  DOS: the patient was seen and examined on 05/28/2024  Brief Hospital Course: PMH of CKD 3B, type II DM, anxiety, anemia, GERD presented to the hospital with complaints of diarrhea. Currently being treated for Salmonella enteritis.   Assessment and Plan: Severe Salmonella enteritis. Receiving IV antibiotics which I will continue. Continue with IV fluid. Will gently advance to clear liquid diet given resolution of vomiting. X-ray abdomen reassuring. Continue supportive care.  Acute kidney injury on CKD 3A. Baseline serum creatinine appears to be around 1.21.3. On admission serum creatinine was 3.45. Improving but now appears to be plateauing. CT abdomen negative for any obstruction.  Ultrasound renal normal as well. Continue with IV fluid. Monitor BMP.   Metabolic acidosis. In the setting of diarrhea. Receiving bicarb infusion which I will continue. Monitor BMP.   Mild hyponatremia. Severe hypokalemia. Hypomagnesemia. Continue to replace. Recheck.   GERD. Continue PPI   Diabetes mellitus, uncontrolled.  With hyper and hypoglycemia without long-term insulin  use. On sliding scale insulin . Will monitor    Nutrition. Patient has not been able to eat well secondary to ongoing nausea and vomiting. May require TPN. Will monitor.  Chronic anxiety. On Xanax  1 mg 3 times daily as needed. Appears to be significantly drowsy. Discussed with patient and the family.  Currently agreeable to reduce the dose of Xanax  from 1 mg to 0.5 mg.  Obesity Class 1 Body mass index is 31.01 kg/m.  Placing the pt at higher risk of poor outcomes.    Poor IV access.  Unable to place a PIV. Needs daily K replacement. Has CKD. And may need HD in future. Requesting IR consult. Will attempt PIV as possible.   Subjective: Diarrhea volume is significantly improving.  From 3.6 L to 1.1 L.  No  vomiting.  Abdominal pain unchanged.  Drowsy.  Physical Exam: General: in moderate distress, No Rash Cardiovascular: S1 and S2 Present, No Murmur Respiratory: Good respiratory effort, Bilateral Air entry present. No Crackles, No wheezes Abdomen: Bowel Sound present, mild diffuse tenderness Extremities: No edema Neuro: Drowsy and oriented x3, no new focal deficit  Data Reviewed: I have Reviewed nursing notes, Vitals, and Lab results. Since last encounter, pertinent lab results CBC and BMP   . I have ordered test including CBC and BMP  .   Disposition: Status is: Inpatient Remains inpatient appropriate because: Monitor for improvement in renal function and potassium level  heparin  injection 5,000 Units Start: 05/24/24 0815   Family Communication: Family at bedside Level of care: Med-Surg   Vitals:   05/28/24 0910 05/28/24 1000 05/28/24 1100 05/28/24 1322  BP: (!) 139/96     Pulse: 69     Resp: 19 16 14 14   Temp: 97.9 F (36.6 C)     TempSrc:      SpO2: 100% 98%    Weight:      Height:         Author: Charlean Congress, MD 05/28/2024 2:58 PM  Please look on [email or web address] to find out who is on call.

## 2024-05-28 NOTE — Plan of Care (Signed)

## 2024-05-28 NOTE — Progress Notes (Signed)
 Hypoglycemic Event  CBG: 63  Treatment: 4 oz juice/soda  Symptoms: Shaky  Follow-up CBG: Time:23:32 CBG Result:53  Possible Reasons for Event: Inadequate meal intake  Comments/MD notified:Per hypoglycemia protocol    Virginia [name]

## 2024-05-29 ENCOUNTER — Other Ambulatory Visit: Payer: Self-pay | Admitting: Internal Medicine

## 2024-05-29 DIAGNOSIS — N179 Acute kidney failure, unspecified: Secondary | ICD-10-CM | POA: Diagnosis not present

## 2024-05-29 LAB — COMPREHENSIVE METABOLIC PANEL WITH GFR
ALT: 16 U/L (ref 0–44)
AST: 18 U/L (ref 15–41)
Albumin: 1.8 g/dL — ABNORMAL LOW (ref 3.5–5.0)
Alkaline Phosphatase: 62 U/L (ref 38–126)
Anion gap: 8 (ref 5–15)
BUN: 16 mg/dL (ref 6–20)
CO2: 22 mmol/L (ref 22–32)
Calcium: 7.3 mg/dL — ABNORMAL LOW (ref 8.9–10.3)
Chloride: 108 mmol/L (ref 98–111)
Creatinine, Ser: 2.17 mg/dL — ABNORMAL HIGH (ref 0.44–1.00)
GFR, Estimated: 28 mL/min — ABNORMAL LOW (ref >60)
Glucose, Bld: 114 mg/dL — ABNORMAL HIGH (ref 70–99)
Potassium: 3.8 mmol/L (ref 3.5–5.1)
Sodium: 138 mmol/L (ref 135–145)
Total Bilirubin: 0.3 mg/dL (ref 0.0–1.2)
Total Protein: 5.1 g/dL — ABNORMAL LOW (ref 6.5–8.1)

## 2024-05-29 LAB — GLUCOSE, CAPILLARY
Glucose-Capillary: 116 mg/dL — ABNORMAL HIGH (ref 70–99)
Glucose-Capillary: 119 mg/dL — ABNORMAL HIGH (ref 70–99)
Glucose-Capillary: 126 mg/dL — ABNORMAL HIGH (ref 70–99)
Glucose-Capillary: 162 mg/dL — ABNORMAL HIGH (ref 70–99)
Glucose-Capillary: 171 mg/dL — ABNORMAL HIGH (ref 70–99)
Glucose-Capillary: 237 mg/dL — ABNORMAL HIGH (ref 70–99)
Glucose-Capillary: 53 mg/dL — ABNORMAL LOW (ref 70–99)

## 2024-05-29 LAB — CULTURE, BLOOD (ROUTINE X 2): Culture  Setup Time: NO GROWTH

## 2024-05-29 LAB — CBC WITH DIFFERENTIAL/PLATELET
Abs Immature Granulocytes: 0.13 10*3/uL — ABNORMAL HIGH (ref 0.00–0.07)
Basophils Absolute: 0 10*3/uL (ref 0.0–0.1)
Basophils Relative: 0 %
Eosinophils Absolute: 0.1 10*3/uL (ref 0.0–0.5)
Eosinophils Relative: 1 %
HCT: 28.4 % — ABNORMAL LOW (ref 36.0–46.0)
Hemoglobin: 9.3 g/dL — ABNORMAL LOW (ref 12.0–15.0)
Immature Granulocytes: 2 %
Lymphocytes Relative: 34 %
Lymphs Abs: 2.2 10*3/uL (ref 0.7–4.0)
MCH: 27.8 pg (ref 26.0–34.0)
MCHC: 32.7 g/dL (ref 30.0–36.0)
MCV: 85 fL (ref 80.0–100.0)
Monocytes Absolute: 0.8 10*3/uL (ref 0.1–1.0)
Monocytes Relative: 13 %
Neutro Abs: 3.2 10*3/uL (ref 1.7–7.7)
Neutrophils Relative %: 50 %
Platelets: 334 10*3/uL (ref 150–400)
RBC: 3.34 MIL/uL — ABNORMAL LOW (ref 3.87–5.11)
RDW: 14.8 % (ref 11.5–15.5)
Smear Review: NORMAL
WBC: 6.5 10*3/uL (ref 4.0–10.5)
nRBC: 0 % (ref 0.0–0.2)

## 2024-05-29 LAB — MAGNESIUM: Magnesium: 1.8 mg/dL (ref 1.7–2.4)

## 2024-05-29 LAB — PHOSPHORUS: Phosphorus: 3.5 mg/dL (ref 2.5–4.6)

## 2024-05-29 MED ORDER — CHOLESTYRAMINE LIGHT 4 G PO PACK
2.0000 g | PACK | Freq: Once | ORAL | Status: AC
  Administered 2024-05-29: 2 g via ORAL
  Filled 2024-05-29: qty 1

## 2024-05-29 MED ORDER — DEXTROSE IN LACTATED RINGERS 5 % IV SOLN: INTRAVENOUS | Status: DC

## 2024-05-29 MED ORDER — FERROUS SULFATE 325 (65 FE) MG PO TABS
325.0000 mg | ORAL_TABLET | Freq: Two times a day (BID) | ORAL | Status: DC
  Administered 2024-05-29 – 2024-05-30 (×2): 325 mg via ORAL
  Filled 2024-05-29 (×2): qty 1

## 2024-05-29 NOTE — TOC CM/SW Note (Signed)
 Transition of Care [facility]) - Inpatient Brief Assessment   Patient Details  Name: Virginia [name] MRN: [id number] Date of Birth: [birth year]  [facility]) CM/SW Contact:    Tom-Johnson, Angelique Ken, RN Phone Number: 05/29/2024, 3:48 PM   Clinical Narrative:  Patient presented to the ED with Abdominal pain, N/V/D, Fever x3 days. Admitted with ARF, on IV abx, IV Fluids and Clear Liquid Diet. Patient continues to have Diarrhea, positive with Salmonella Enteritis, has a Flexiseal. Supportive care continues.   From home with her parents, has a supportive son. Patient is not currently employed, not on disability. Uses Medicaid transportation to and from her appointments. Does not have DME's at home. Does not have a PCP, Hospital f/u will be scheduled at discharge. Uses [facility] on [address]   Patient not Medically ready for discharge.  CM will continue to follow as patient progresses with care towards discharge.            Transition of Care Asessment: Insurance and Status: Insurance coverage has been reviewed Patient has primary care physician: No [facility] f/u will be scheduled at discharge.) Home environment has been reviewed: Yes Prior level of function:: Independent Prior/Current Home Services: No current home services Social Drivers of Health Review: SDOH reviewed no interventions necessary Readmission risk has been reviewed: Yes Transition of care needs: transition of care needs identified, TOC will continue to follow

## 2024-05-29 NOTE — Progress Notes (Signed)
 [facility] Progress Note Patient: Virginia [name] ZOX:[id number] DOB: [birth year] DOA: 05/24/2024  DOS: the patient was seen and examined on 05/29/2024  Brief Hospital Course: PMH of CKD 3B, type II DM, anxiety, anemia, GERD presented to the hospital with complaints of diarrhea. Currently being treated for Salmonella enteritis.   Assessment and Plan: Severe Salmonella enteritis. Receiving IV antibiotics which I will continue due to severity of the enteritis. Stool output 3.6 L on 6/14, now improving to 1.8 L Continue with IV fluid. Will gently advance to clear liquid diet given resolution of vomiting. X-ray abdomen reassuring. Continue supportive care. Will give 1 dose of Questran as well. Currently receiving zinc  and probiotics as well.  Acute kidney injury on CKD 3A. Baseline serum creatinine appears to be around 1.2-1.3. On admission serum creatinine was 3.45. Improving but now appears to be plateauing. CT abdomen negative for any obstruction.  Ultrasound renal normal as well. Continue with IV fluid. Monitor BMP.   Metabolic acidosis. In the setting of diarrhea. Treated with bicarb infusion. Now switching to D5 LR. Monitor BMP.   Mild hyponatremia. Severe hypokalemia. Hypomagnesemia. Continue to replace. Recheck.   GERD. Continue PPI   Diabetes mellitus, uncontrolled.  With hyper and hypoglycemia without long-term insulin  use. On sliding scale insulin . Will monitor.  Will continue every 4 hours CBG checks rather than ACHS for now.  Nutrition. Patient has not been able to eat well secondary to ongoing nausea and vomiting.  Since admission.  Today is day 5 of admission. Gradually advancing her diet.  If she is not able to tolerate oral diet she may require TPN. Will monitor.  Chronic anxiety. On Xanax  1 mg 3 times daily as needed. Has been drowsy on my evaluation on 6/15 and 6/16. After discussion with the family Xanax  dose was reduced.  Significant  improvement in mentation with reduction 0.5 mg.  Obesity Class 1 Body mass index is 31.01 kg/m.  Placing the pt at higher risk of poor outcomes.   Poor IV access. Currently only has 1 IV access. Not a good candidate for PICC line placement or midline placement given her CKD and future needs for hemodialysis. If she loses her IV access, nephrology recommends to proceed with central line versus IR guided line placement.  Iron deficiency anemia as well as related to B12 deficiency. Hemoglobin chronically around 9. On admission hemoglobin was 11.  Dropped down to 9 currently stabilizing in that range. No schistocytes on the smear. Iron level 11.  B12 level 273.  Folic acid  14.9. Receiving B12 injection and oral iron supplementation.   Subjective: No nausea no vomiting today.  Abdominal pain present although patient is actually feeling better!  Stool output greenish liquid.  No fever no chills. Physical Exam: General: in Mild distress, No Rash Cardiovascular: S1 and S2 Present, No Murmur Respiratory: Good respiratory effort, Bilateral Air entry present. No Crackles, No wheezes Abdomen: Bowel Sound present, diffuse abdominal tenderness Extremities: No edema Neuro: Alert and oriented x3, no new focal deficit  Data Reviewed: I have Reviewed nursing notes, Vitals, and Lab results. Since last encounter, pertinent lab results CBC and BMP   . I have ordered test including CBC and BMP  .   Disposition: Status is: Inpatient Remains inpatient appropriate because: Monitor for improvement in stool output  heparin  injection 5,000 Units Start: 05/24/24 0815   Family Communication: No one at bedside Level of care: Med-Surg   Vitals:   05/28/24 1853 05/28/24 2026 05/29/24 0327 05/29/24 1630  BP:  112/74 (!) 144/83 (!) 148/100  Pulse:  79 70 73  Resp: 18 18 16 18   Temp:  (!) 97.4 F (36.3 C) 98.1 F (36.7 C) (!) 97.5 F (36.4 C)  TempSrc:  Oral  Oral  SpO2:  100% 100% 100%  Weight:       Height:         Author: Charlean Congress, MD 05/29/2024 7:11 PM  Please look on [email or web address] to find out who is on call.

## 2024-05-29 NOTE — Plan of Care (Signed)

## 2024-05-30 ENCOUNTER — Encounter: Admitting: Adult Health

## 2024-05-30 DIAGNOSIS — N179 Acute kidney failure, unspecified: Secondary | ICD-10-CM | POA: Diagnosis not present

## 2024-05-30 DIAGNOSIS — A020 Salmonella enteritis: Secondary | ICD-10-CM | POA: Diagnosis not present

## 2024-05-30 LAB — COMPREHENSIVE METABOLIC PANEL WITH GFR
ALT: 14 U/L (ref 0–44)
AST: 19 U/L (ref 15–41)
Albumin: 1.8 g/dL — ABNORMAL LOW (ref 3.5–5.0)
Alkaline Phosphatase: 64 U/L (ref 38–126)
Anion gap: 10 (ref 5–15)
BUN: 11 mg/dL (ref 6–20)
CO2: 19 mmol/L — ABNORMAL LOW (ref 22–32)
Calcium: 7.5 mg/dL — ABNORMAL LOW (ref 8.9–10.3)
Chloride: 110 mmol/L (ref 98–111)
Creatinine, Ser: 1.75 mg/dL — ABNORMAL HIGH (ref 0.44–1.00)
GFR, Estimated: 36 mL/min — ABNORMAL LOW
Glucose, Bld: 122 mg/dL — ABNORMAL HIGH (ref 70–99)
Potassium: 3.2 mmol/L — ABNORMAL LOW (ref 3.5–5.1)
Sodium: 139 mmol/L (ref 135–145)
Total Bilirubin: 0.3 mg/dL (ref 0.0–1.2)
Total Protein: 5.1 g/dL — ABNORMAL LOW (ref 6.5–8.1)

## 2024-05-30 LAB — CBC
HCT: 28.2 % — ABNORMAL LOW (ref 36.0–46.0)
Hemoglobin: 9.2 g/dL — ABNORMAL LOW (ref 12.0–15.0)
MCH: 27.7 pg (ref 26.0–34.0)
MCHC: 32.6 g/dL (ref 30.0–36.0)
MCV: 84.9 fL (ref 80.0–100.0)
Platelets: 359 10*3/uL (ref 150–400)
RBC: 3.32 MIL/uL — ABNORMAL LOW (ref 3.87–5.11)
RDW: 15 % (ref 11.5–15.5)
WBC: 7.8 10*3/uL (ref 4.0–10.5)
nRBC: 0 % (ref 0.0–0.2)

## 2024-05-30 LAB — CULTURE, BLOOD (ROUTINE X 2): Culture: NO GROWTH

## 2024-05-30 LAB — MAGNESIUM: Magnesium: 1.5 mg/dL — ABNORMAL LOW (ref 1.7–2.4)

## 2024-05-30 LAB — GLUCOSE, CAPILLARY
Glucose-Capillary: 133 mg/dL — ABNORMAL HIGH (ref 70–99)
Glucose-Capillary: 140 mg/dL — ABNORMAL HIGH (ref 70–99)
Glucose-Capillary: 144 mg/dL — ABNORMAL HIGH (ref 70–99)
Glucose-Capillary: 73 mg/dL (ref 70–99)

## 2024-05-30 MED ORDER — FERROUS SULFATE 325 (65 FE) MG PO TABS: 325.0000 mg | ORAL_TABLET | Freq: Two times a day (BID) | ORAL | 0 refills | Status: DC

## 2024-05-30 MED ORDER — OXYCODONE HCL 5 MG PO TABS: 5.0000 mg | ORAL_TABLET | Freq: Four times a day (QID) | ORAL | 0 refills | Status: DC | PRN

## 2024-05-30 MED ORDER — MAGNESIUM SULFATE 4 GM/100ML IV SOLN
4.0000 g | Freq: Once | INTRAVENOUS | Status: AC
  Administered 2024-05-30: 4 g via INTRAVENOUS
  Filled 2024-05-30: qty 100

## 2024-05-30 MED ORDER — DEXTROSE IN LACTATED RINGERS 5 % IV SOLN: INTRAVENOUS | Status: DC

## 2024-05-30 MED ORDER — PANTOPRAZOLE SODIUM 40 MG PO TBEC: 40.0000 mg | DELAYED_RELEASE_TABLET | Freq: Every day | ORAL | 0 refills | Status: DC

## 2024-05-30 MED ORDER — POTASSIUM CHLORIDE CRYS ER 20 MEQ PO TBCR
40.0000 meq | EXTENDED_RELEASE_TABLET | ORAL | Status: DC
  Administered 2024-05-30: 40 meq via ORAL
  Filled 2024-05-30: qty 2

## 2024-05-30 MED ORDER — SACCHAROMYCES BOULARDII 250 MG PO CAPS: 250.0000 mg | ORAL_CAPSULE | Freq: Two times a day (BID) | ORAL | 0 refills | Status: AC

## 2024-05-30 NOTE — TOC Transition Note (Signed)
 Transition of Care [facility]) - Discharge Note   Patient Details  Name: Virginia [name] MRN: [id number] Date of Birth: [birth year]  [facility] CM/SW Contact:  Tom-Johnson, Angelique Ken, RN Phone Number: 05/30/2024, 11:16 AM   Clinical Narrative:     Patient is scheduled for discharge today.  Readmission Risk Assessment done. New patient establishment, hospital f/u and discharge instructions on AVS. No TOC needs or recommendations noted. Mother, Alm Jacks to transport at discharge.  No further TOC needs noted.       Final next level of care: Home/Self Care Barriers to Discharge: Barriers Resolved   Patient Goals and CMS Choice Patient states their goals for this hospitalization and ongoing recovery are:: To return home CMS Medicare.gov Compare Post Acute Care list provided to:: Patient Choice offered to / list presented to : NA      Discharge Placement                Patient to be transferred to facility by: Mother Name of family member notified: Evette    Discharge Plan and Services Additional resources added to the After Visit Summary for                  DME Arranged: N/A DME Agency: NA       HH Arranged: NA HH Agency: NA        Social Drivers of Health (SDOH) Interventions SDOH Screenings   Food Insecurity: No Food Insecurity (05/24/2024)  Housing: Low Risk  (05/24/2024)  Transportation Needs: No Transportation Needs (05/24/2024)  Utilities: Not At Risk (05/24/2024)  Tobacco Use: Medium Risk (05/24/2024)     Readmission Risk Interventions    05/29/2024    3:47 PM 02/06/2024    2:54 PM  Readmission Risk Prevention Plan  Transportation Screening Complete Complete  PCP or Specialist Appt within 3-5 Days  Complete  HRI or Home Care Consult  Complete  Social Work Consult for Recovery Care Planning/Counseling  Complete  Palliative Care Screening  Complete  Medication Review [facility]) Referral to Pharmacy Complete  PCP or  Specialist appointment within 3-5 days of discharge Complete   HRI or Home Care Consult Complete   SW Recovery Care/Counseling Consult Complete   Palliative Care Screening Not Applicable   Skilled Nursing Facility Not Applicable

## 2024-05-30 NOTE — Progress Notes (Signed)
 Patient discharged to home via car - Her mother picked her up.  PIV removed.  Discharge instructions reviewed with patient and all questions addressed and answered.  Personal belongings including her cell phone and glasses are with patient.  Necia Bali RN

## 2024-05-30 NOTE — Progress Notes (Signed)
 This encounter was created in error - please disregard.

## 2024-05-30 NOTE — Progress Notes (Signed)
 Patient's flexiseal came out per patient. Patient refused to have it be reinserted.

## 2024-05-30 NOTE — Discharge Summary (Signed)
 Physician Discharge Summary  Thedora Rings ZOX:[id number] DOB: July 27, 1976 DOA: 05/24/2024  PCP: Pcp, No  Admit date: 05/24/2024 Discharge date: 05/30/2024  Admitted From: Home Disposition: Home  Recommendations for Outpatient Follow-up:  Follow up with PCP in 1-2 weeks Started on ferrous sulfate  for iron deficiency anemia  Home Health: No Equipment/Devices: None  Discharge Condition: Stable CODE STATUS: Full code Diet recommendation: Consistent carbohydrate diet  History of present illness:  Aracelia Brinson is a 48 year old female with past medical history significant for DM2, CKD stage IIIa, anemia, anxiety, GERD who presented to [facility] ED on 05/24/2024 with complaints of diarrhea.  Patient was started on antibiotics.  Workup remarkable for Salmonella enteritis on GI PCR panel.  Patient was admitted to hospital service for further evaluation and management.  Hospital course:  Salmonella enteritis, severe Patient presenting to ED with 3-4 days of nausea, vomiting and diarrhea; also endorses decreased oral intake.  Patient endorses eating a lot of tomatoes over the last week with recent FDA class I recall for possible Salmonella contaminant.  Denies any family members with similar symptoms.  C. difficile PCR negative.  GI PCR panel positive for Salmonella.  Patient was supported with IV fluid hydration, completed 5-day course of IV antibiotics with ceftriaxone .  Patient's diet was slowly advanced with toleration.  Diarrhea markedly improved.  Discharging home, encouraged increased oral/fluid intake.   Acute renal failure on CKD stage IIIb Metabolic acidosis Patient with baseline creatinine 1.3 to 1.7.  At time of admission creatinine up to 3.45, likely secondary to prerenal azotemia/dehydration in the setting of diffuse diarrhea with Salmonella enteritis as above.  Patient was supported IV fluid hydration with improvement of creatinine to 1.75 at time of discharge.  Patient  encouraged to increase oral intake.  Recommend repeat BMP 1-2 weeks.  Hyponatremia Hypokalemia Hypomagnesemia Etiology likely secondary to GI loss from nausea vomiting and diarrhea.  Aggressively repleted during hospitalization.  Iron deficiency anemia Anemia panel with iron 11, TIBC 207, ferritin 51, folate 14.9, vitamin B12 273.  Started on ferrous sulfate  325 mg p.o. twice daily.  Outpatient follow-up with PCP.  Likely due for screening colonoscopy.  Anxiety/depression Continue Xanax , fluoxetine .  Type 2 diabetes mellitus, with hyperglycemia Hemoglobin A1c 8.1, poorly controlled.  Continue Lantus  10 units subcu daily.  Outpatient follow-up with PCP.  GERD Continue PPI  Obesity, class I Body mass index is 31.01 kg/m.   Discharge Diagnoses:  Principal Problem:   Acute renal failure (ARF) (HCC) Active Problems:   Anxiety   DM2 (diabetes mellitus, type 2) (HCC)   Abdominal pain   Nausea & vomiting   Dehydration   Gastroesophageal reflux disease   Anemia of chronic disease   Salmonella enteritis    Discharge Instructions  Discharge Instructions     Call MD for:  difficulty breathing, headache or visual disturbances   Complete by: As directed    Call MD for:  extreme fatigue   Complete by: As directed    Call MD for:  persistant dizziness or light-headedness   Complete by: As directed    Call MD for:  persistant nausea and vomiting   Complete by: As directed    Call MD for:  severe uncontrolled pain   Complete by: As directed    Call MD for:  temperature >100.4   Complete by: As directed    Diet - low sodium heart healthy   Complete by: As directed    Increase activity slowly   Complete by: As directed  Allergies as of 05/30/2024       Reactions   Ace Inhibitors Nausea And Vomiting, Cough   Banana Nausea And Vomiting   Lamictal [lamotrigine] Nausea And Vomiting   Morphine  Itching   Nsaids Other (See Comments)   Kidney failure   Topiramate Nausea  And Vomiting        Medication List     TAKE these medications    acetaminophen  325 MG tablet Commonly known as: TYLENOL  Take 2 tablets (650 mg total) by mouth every 6 (six) hours as needed for mild pain (pain score 1-3) (or Fever >/= 101).   albuterol  108 (90 Base) MCG/ACT inhaler Commonly known as: VENTOLIN  HFA Inhale 2 puffs every 4 (four) hours as needed into the lungs for wheezing or shortness of breath.   ALPRAZolam  1 MG tablet Commonly known as: XANAX  Take 1 mg by mouth 3 (three) times daily as needed for anxiety.   benzonatate  200 MG capsule Commonly known as: TESSALON  Take 1 capsule (200 mg total) by mouth 3 (three) times daily as needed for cough.   Blood Glucose Monitoring Suppl Devi 1 each by Does not apply route 3 (three) times daily. May dispense any manufacturer covered by patient's insurance.   BLOOD GLUCOSE TEST STRIPS Strp 1 each by Does not apply route 3 (three) times daily. Use as directed to check blood sugar. May dispense any manufacturer covered by patient's insurance and fits patient's device.   erythromycin ophthalmic ointment Place 1 Application into the left eye 3 (three) times daily.   feeding supplement Liqd Take 237 mLs by mouth 2 (two) times daily between meals.   ferrous sulfate  325 (65 FE) MG tablet Take 1 tablet (325 mg total) by mouth 2 (two) times daily with a meal.   FLUoxetine  40 MG capsule Commonly known as: PROZAC  Take 40 mg by mouth daily.   fluticasone  50 MCG/ACT nasal spray Commonly known as: FLONASE  Place 1 spray into both nostrils at bedtime.   FreeStyle Libre 3 Sensor Misc Place 1 sensor on the skin every 14 days. Use to check glucose continuously   insulin  glargine 100 UNIT/ML Solostar Pen Commonly known as: LANTUS  Inject 10 Units into the skin daily.   Lancet Device Misc 1 each by Does not apply route 3 (three) times daily. May dispense any manufacturer covered by patient's insurance.   Lancets Misc 1 each by  Does not apply route 3 (three) times daily. Use as directed to check blood sugar. May dispense any manufacturer covered by patient's insurance and fits patient's device.   multivitamin with minerals Tabs tablet Take 1 tablet by mouth daily.   ofloxacin 0.3 % ophthalmic solution Commonly known as: OCUFLOX Place 1 drop into the left eye 4 (four) times daily.   ondansetron  4 MG disintegrating tablet Commonly known as: ZOFRAN -ODT Take 4 mg by mouth 4 (four) times daily as needed for nausea or vomiting.   oxyCODONE  5 MG immediate release tablet Commonly known as: Roxicodone  Take 1 tablet (5 mg total) by mouth every 6 (six) hours as needed for moderate pain (pain score 4-6). What changed: reasons to take this   pantoprazole  40 MG tablet Commonly known as: PROTONIX  Take 1 tablet (40 mg total) by mouth daily.   Pen Needles 31G X 5 MM Misc 1 each by Does not apply route 3 (three) times daily. May dispense any manufacturer covered by patient's insurance.   saccharomyces boulardii 250 MG capsule Commonly known as: FLORASTOR Take 1 capsule (250 mg total)  by mouth 2 (two) times daily for 14 days.        Allergies  Allergen Reactions   Ace Inhibitors Nausea And Vomiting and Cough   Banana Nausea And Vomiting   Lamictal [Lamotrigine] Nausea And Vomiting   Morphine  Itching   Nsaids Other (See Comments)    Kidney failure   Topiramate Nausea And Vomiting    Consultations: None   Procedures/Studies: DG Abd Portable 1V Result Date: 05/27/2024 CLINICAL DATA:  [id number] Intractable nausea and vomiting [id number] EXAM: PORTABLE ABDOMEN - 1 VIEW COMPARISON:  CT abdomen pelvis 05/24/2024 FINDINGS: The bowel gas pattern is normal. No radio-opaque calculi or other significant radiographic abnormality are seen. Right upper quadrant surgical clips. Question left upper quadrant bowel surgical clips. 4 cm density overlying the pelvis of unclear etiology. IMPRESSION: 1. Nonobstructive bowel gas pattern.  2. A 4 cm density overlying the pelvis of unclear etiology. Correlate with physical exam as this is likely external to the patient. Electronically Signed   By: Morgane  Naveau M.D.   On: 05/27/2024 15:02   US  RENAL Result Date: 05/26/2024 EXAM: RETROPERITONEAL ULTRASOUND OF THE KIDNEYS AND URINARY BLADDER TECHNIQUE: Real-time ultrasonography of the retroperitoneum including the kidneys and urinary bladder was performed. COMPARISON: CT abdomen / pelvis dated 05/24/2024 CLINICAL HISTORY: Costovertebral angle tenderness FINDINGS: RIGHT KIDNEY: The right kidney measures 9.7 x 4.2 x 4.7 cm (100 ml). The right kidney demonstrates normal cortical echogenicity. No hydronephrosis or intrarenal stones. LEFT KIDNEY: The left kidney measures 9.1 x 3.8 x 4.1 cm (75 ml). The left kidney demonstrates normal cortical echogenicity. No hydronephrosis or intrarenal stones. BLADDER: Unremarkable appearance of the bladder. IMPRESSION: 1. No acute findings. Electronically signed by: Zadie Herter MD 05/26/2024 10:32 PM EDT RP Workstation: [id number]   CT ABDOMEN PELVIS WO CONTRAST Result Date: 05/24/2024 CLINICAL DATA:  Acute nonlocalized abdominal pain, nausea, vomiting, diarrhea. The pain is on the right side and there is fever EXAM: CT ABDOMEN AND PELVIS WITHOUT CONTRAST TECHNIQUE: Multidetector CT imaging of the abdomen and pelvis was performed following the standard protocol without IV contrast. RADIATION DOSE REDUCTION: This exam was performed according to the departmental dose-optimization program which includes automated exposure control, adjustment of the mA and/or kV according to patient size and/or use of iterative reconstruction technique. COMPARISON:  02/06/2024 FINDINGS: Lower chest: No acute abnormality. Hepatobiliary: Unremarkable liver. Cholecystectomy. No biliary dilation. Pancreas: Unremarkable. Spleen: Unremarkable. Adrenals/Urinary Tract: Normal adrenal glands. No urinary calculi or hydronephrosis. Bladder  is unremarkable. Stomach/Bowel: Postoperative change of Roux-en-Y gastric bypass. Patulous small bowel-small bowel anastomosis in the left abdomen. Fluid-filled distention the small bowel throughout the abdomen without definite transition point. Liquid stool in the colon compatible with diarrheal illness. No definite bowel wall thickening noting limitations of noncontrast exam. Normal appendix. Vascular/Lymphatic: No significant vascular findings are present. Prominent subcentimeter mesenteric and retroperitoneal lymph nodes are likely reactive. Reproductive: Unremarkable. Other: No free intraperitoneal fluid or air. Musculoskeletal: No acute fracture. IMPRESSION: Fluid-filled small bowel throughout the abdomen without definite transition point. Liquid stool in the colon compatible with diarrheal illness. Findings are favored to enteritis. Electronically Signed   By: Rozell Cornet M.D.   On: 05/24/2024 03:09     Subjective: Patient seen examined bedside, lying in bed.  Abdominal pain and diarrhea markedly improved.  Wishes for discharge home today.  Completed IV antibiotics yesterday.  No other specific complaints, concerns or questions at this time.  Denies headache, no dizziness, no chest pain, no palpitations, no shortness of breath, no current abdominal  pain, no fever/chills/night sweats, no nausea/vomiting/diarrhea, no focal weakness, no fatigue, no paresthesias.  No acute events overnight per nursing staff.  Discharge Exam: Vitals:   05/29/24 2032 05/30/24 0821  BP: 129/70 (!) 172/95  Pulse: 79 89  Resp:    Temp: 98.1 F (36.7 C)   SpO2: 100% 100%   Vitals:   05/29/24 0327 05/29/24 1630 05/29/24 2032 05/30/24 0821  BP: (!) 144/83 (!) 148/100 129/70 (!) 172/95  Pulse: 70 73 79 89  Resp: 16 18    Temp: 98.1 F (36.7 C) (!) 97.5 F (36.4 C) 98.1 F (36.7 C)   TempSrc:  Oral Oral   SpO2: 100% 100% 100% 100%  Weight:      Height:        Physical Exam: GEN: NAD, alert and oriented  x 3, obese HEENT: NCAT, PERRL, EOMI, sclera clear, MMM PULM: CTAB w/o wheezes/crackles, normal respiratory effort, room air CV: RRR w/o M/G/R GI: abd soft, NTND, NABS, no R/G/M MSK: no peripheral edema, muscle strength globally intact 5/5 bilateral upper/lower extremities NEURO: CN II-XII intact, no focal deficits, sensation to light touch intact PSYCH: normal mood/affect Integumentary: dry/intact, no rashes or wounds    The results of significant diagnostics from this hospitalization (including imaging, microbiology, ancillary and laboratory) are listed below for reference.     Microbiology: Recent Results (from the past 240 hours)  Gastrointestinal Panel by PCR , Stool     Status: Abnormal   Collection Time: 05/24/24  8:14 AM   Specimen: Stool  Result Value Ref Range Status   Campylobacter species NOT DETECTED NOT DETECTED Final   Plesimonas shigelloides NOT DETECTED NOT DETECTED Final   Salmonella species DETECTED (A) NOT DETECTED Final    Comment: RESULT CALLED TO, READ BACK BY AND VERIFIED WITH: Tor Freed, RN 1027 05/25/24 GM    Yersinia enterocolitica NOT DETECTED NOT DETECTED Final   Vibrio species NOT DETECTED NOT DETECTED Final   Vibrio cholerae NOT DETECTED NOT DETECTED Final   Enteroaggregative E coli (EAEC) NOT DETECTED NOT DETECTED Final   Enteropathogenic E coli (EPEC) NOT DETECTED NOT DETECTED Final   Enterotoxigenic E coli (ETEC) NOT DETECTED NOT DETECTED Final   Shiga like toxin producing E coli (STEC) NOT DETECTED NOT DETECTED Final   Shigella/Enteroinvasive E coli (EIEC) NOT DETECTED NOT DETECTED Final   Cryptosporidium NOT DETECTED NOT DETECTED Final   Cyclospora cayetanensis NOT DETECTED NOT DETECTED Final   Entamoeba histolytica NOT DETECTED NOT DETECTED Final   Giardia lamblia NOT DETECTED NOT DETECTED Final   Adenovirus F40/41 NOT DETECTED NOT DETECTED Final   Astrovirus NOT DETECTED NOT DETECTED Final   Norovirus GI/GII NOT DETECTED NOT DETECTED  Final   Rotavirus A NOT DETECTED NOT DETECTED Final   Sapovirus (I, II, IV, and V) NOT DETECTED NOT DETECTED Final    Comment: Performed at [facility], [address] [place], [place] [address]  C Difficile Quick Screen w PCR reflex     Status: None   Collection Time: 05/25/24  8:51 AM   Specimen: STOOL  Result Value Ref Range Status   C Diff antigen NEGATIVE NEGATIVE Final   C Diff toxin NEGATIVE NEGATIVE Final   C Diff interpretation No C. difficile detected.  Final    Comment: Performed at [facility], [address] [place], [place] [address]  Culture, blood (Routine X 2) w Reflex to ID Panel     Status: Abnormal   Collection Time: 05/25/24  1:35  PM   Specimen: BLOOD RIGHT ARM  Result Value Ref Range Status   Specimen Description BLOOD RIGHT ARM  Final   Special Requests   Final    BOTTLES DRAWN AEROBIC AND ANAEROBIC Blood Culture results may not be optimal due to an inadequate volume of blood received in culture bottles   Culture  Setup Time   Final    GRAM POSITIVE COCCI IN CLUSTERS ANAEROBIC BOTTLE ONLY CRITICAL RESULT CALLED TO, READ BACK BY AND VERIFIED WITH: PHARMD J LEDFORD 05/27/2024 @ 0351 BY AB    Culture (A)  Final    STAPHYLOCOCCUS HOMINIS THE SIGNIFICANCE OF ISOLATING THIS ORGANISM FROM A SINGLE SET OF BLOOD CULTURES WHEN MULTIPLE SETS ARE DRAWN IS UNCERTAIN. PLEASE NOTIFY THE MICROBIOLOGY DEPARTMENT WITHIN ONE WEEK IF SPECIATION AND SENSITIVITIES ARE REQUIRED. Performed at [facility], [address] [place], [place] [address]    Report Status 05/29/2024 FINAL  Final  Blood Culture ID Panel (Reflexed)     Status: Abnormal   Collection Time: 05/25/24  1:35 PM  Result Value Ref Range Status   Enterococcus faecalis NOT DETECTED NOT DETECTED Final   Enterococcus Faecium NOT DETECTED NOT DETECTED Final   Listeria monocytogenes NOT DETECTED NOT DETECTED Final   Staphylococcus species DETECTED (A) NOT DETECTED Final    Comment: CRITICAL  RESULT CALLED TO, READ BACK BY AND VERIFIED WITH: PHARMD J LEDFORD 05/27/2024 @ 0351 BY AB    Staphylococcus aureus (BCID) NOT DETECTED NOT DETECTED Final   Staphylococcus epidermidis NOT DETECTED NOT DETECTED Final   Staphylococcus lugdunensis NOT DETECTED NOT DETECTED Final   Streptococcus species NOT DETECTED NOT DETECTED Final   Streptococcus agalactiae NOT DETECTED NOT DETECTED Final   Streptococcus pneumoniae NOT DETECTED NOT DETECTED Final   Streptococcus pyogenes NOT DETECTED NOT DETECTED Final   A.calcoaceticus-baumannii NOT DETECTED NOT DETECTED Final   Bacteroides fragilis NOT DETECTED NOT DETECTED Final   Enterobacterales NOT DETECTED NOT DETECTED Final   Enterobacter cloacae complex NOT DETECTED NOT DETECTED Final   Escherichia coli NOT DETECTED NOT DETECTED Final   Klebsiella aerogenes NOT DETECTED NOT DETECTED Final   Klebsiella oxytoca NOT DETECTED NOT DETECTED Final   Klebsiella pneumoniae NOT DETECTED NOT DETECTED Final   Proteus species NOT DETECTED NOT DETECTED Final   Salmonella species NOT DETECTED NOT DETECTED Final   Serratia marcescens NOT DETECTED NOT DETECTED Final   Haemophilus influenzae NOT DETECTED NOT DETECTED Final   Neisseria meningitidis NOT DETECTED NOT DETECTED Final   Pseudomonas aeruginosa NOT DETECTED NOT DETECTED Final   Stenotrophomonas maltophilia NOT DETECTED NOT DETECTED Final   Candida albicans NOT DETECTED NOT DETECTED Final   Candida auris NOT DETECTED NOT DETECTED Final   Candida glabrata NOT DETECTED NOT DETECTED Final   Candida krusei NOT DETECTED NOT DETECTED Final   Candida parapsilosis NOT DETECTED NOT DETECTED Final   Candida tropicalis NOT DETECTED NOT DETECTED Final   Cryptococcus neoformans/gattii NOT DETECTED NOT DETECTED Final    Comment: Performed at [facility], [address] [place], [place] [address]  Culture, blood (Routine X 2) w Reflex to ID Panel     Status: None   Collection Time: 05/25/24  1:36 PM    Specimen: BLOOD LEFT HAND  Result Value Ref Range Status   Specimen Description BLOOD LEFT HAND  Final   Special Requests   Final    BOTTLES DRAWN AEROBIC AND ANAEROBIC Blood Culture results may not be optimal due to an inadequate volume of  blood received in culture bottles   Culture   Final    NO GROWTH 5 DAYS Performed at [facility], [address], [place], [place] [address]    Report Status 05/30/2024 FINAL  Final     Labs: BNP (last 3 results) No results for input(s): BNP in the last 8760 hours. Basic Metabolic Panel: Recent Labs  Lab 05/24/24 1038 05/25/24 0529 05/27/24 0815 05/27/24 1803 05/28/24 1229 05/29/24 0630 05/30/24 0514  NA 132*   < > 133* 136 135 138 139  K 3.2*   < > 2.8* 3.0* 3.0* 3.8 3.2*  CL 107   < > 104 106 104 108 110  CO2 10*   < > 18* 20* 21* 22 19*  GLUCOSE 168*   < > 151* 98 99 114* 122*  BUN 53*   < > 22* 22* 21* 16 11  CREATININE 3.23*   < > 2.19* 2.19* 2.32* 2.17* 1.75*  CALCIUM  7.4*   < > 7.8* 7.6* 7.5* 7.3* 7.5*  MG 1.5*  --  1.7  --  2.1 1.8 1.5*  PHOS 6.8*  --  3.1  --   --  3.5  --    < > = values in this interval not displayed.   Liver Function Tests: Recent Labs  Lab 05/24/24 0149 05/24/24 1038 05/27/24 0815 05/28/24 1229 05/29/24 0630 05/30/24 0514  AST 24 22  --  20 18 19   ALT 12 14  --  14 16 14   ALKPHOS 97 84  --  65 62 64  BILITOT 0.3 0.4  --  0.3 0.3 0.3  PROT 7.1 6.9  --  5.5* 5.1* 5.1*  ALBUMIN 3.3* 2.5* 2.1* 1.9* 1.8* 1.8*   No results for input(s): LIPASE, AMYLASE in the last 168 hours. Recent Labs  Lab 05/28/24 1229  AMMONIA 44*   CBC: Recent Labs  Lab 05/24/24 0149 05/24/24 1038 05/26/24 0747 05/27/24 0815 05/28/24 1229 05/29/24 0630 05/30/24 0514  WBC 4.1   < > 4.7 5.5 5.4 6.5 7.8  NEUTROABS 2.5  --   --   --  3.3 3.2  --   HGB 11.1*   < > 9.0* 10.8* 9.5* 9.3* 9.2*  HCT 32.9*   < > 26.5* 31.4* 27.7* 28.4* 28.2*  MCV 85.2   < > 83.1 82.8 82.4 85.0 84.9  PLT 242   < > 255 309 304  334 359   < > = values in this interval not displayed.   Cardiac Enzymes: No results for input(s): CKTOTAL, CKMB, CKMBINDEX, TROPONINI in the last 168 hours. BNP: Invalid input(s): POCBNP CBG: Recent Labs  Lab 05/29/24 1625 05/29/24 2032 05/30/24 0018 05/30/24 0325 05/30/24 0817  GLUCAP 116* 162* 73 140* 144*   D-Dimer No results for input(s): DDIMER in the last 72 hours. Hgb A1c No results for input(s): HGBA1C in the last 72 hours. Lipid Profile No results for input(s): CHOL, HDL, LDLCALC, TRIG, CHOLHDL, LDLDIRECT in the last 72 hours. Thyroid function studies Recent Labs    05/28/24 1229  TSH 1.603   Anemia work up No results for input(s): VITAMINB12, FOLATE, FERRITIN, TIBC, IRON, RETICCTPCT in the last 72 hours. Urinalysis    Component Value Date/Time   COLORURINE YELLOW 05/26/2024 1900   APPEARANCEUR CLEAR 05/26/2024 1900   APPEARANCEUR Hazy 07/09/2012 2031   LABSPEC 1.012 05/26/2024 1900   LABSPEC 1.016 07/09/2012 2031   PHURINE 6.0 05/26/2024 1900   GLUCOSEU NEGATIVE 05/26/2024 1900   GLUCOSEU 150 mg/dL  07/09/2012 2031   HGBUR NEGATIVE 05/26/2024 1900   BILIRUBINUR NEGATIVE 05/26/2024 1900   BILIRUBINUR Negative 07/09/2012 2031   KETONESUR NEGATIVE 05/26/2024 1900   PROTEINUR 100 (A) 05/26/2024 1900   UROBILINOGEN 1.0 10/25/2012 1509   NITRITE NEGATIVE 05/26/2024 1900   LEUKOCYTESUR NEGATIVE 05/26/2024 1900   LEUKOCYTESUR Negative 07/09/2012 2031   Sepsis Labs Recent Labs  Lab 05/27/24 0815 05/28/24 1229 05/29/24 0630 05/30/24 0514  WBC 5.5 5.4 6.5 7.8   Microbiology Recent Results (from the past 240 hours)  Gastrointestinal Panel by PCR , Stool     Status: Abnormal   Collection Time: 05/24/24  8:14 AM   Specimen: Stool  Result Value Ref Range Status   Campylobacter species NOT DETECTED NOT DETECTED Final   Plesimonas shigelloides NOT DETECTED NOT DETECTED Final   Salmonella species DETECTED (A) NOT  DETECTED Final    Comment: RESULT CALLED TO, READ BACK BY AND VERIFIED WITH: Tor Freed, RN 1027 05/25/24 GM    Yersinia enterocolitica NOT DETECTED NOT DETECTED Final   Vibrio species NOT DETECTED NOT DETECTED Final   Vibrio cholerae NOT DETECTED NOT DETECTED Final   Enteroaggregative E coli (EAEC) NOT DETECTED NOT DETECTED Final   Enteropathogenic E coli (EPEC) NOT DETECTED NOT DETECTED Final   Enterotoxigenic E coli (ETEC) NOT DETECTED NOT DETECTED Final   Shiga like toxin producing E coli (STEC) NOT DETECTED NOT DETECTED Final   Shigella/Enteroinvasive E coli (EIEC) NOT DETECTED NOT DETECTED Final   Cryptosporidium NOT DETECTED NOT DETECTED Final   Cyclospora cayetanensis NOT DETECTED NOT DETECTED Final   Entamoeba histolytica NOT DETECTED NOT DETECTED Final   Giardia lamblia NOT DETECTED NOT DETECTED Final   Adenovirus F40/41 NOT DETECTED NOT DETECTED Final   Astrovirus NOT DETECTED NOT DETECTED Final   Norovirus GI/GII NOT DETECTED NOT DETECTED Final   Rotavirus A NOT DETECTED NOT DETECTED Final   Sapovirus (I, II, IV, and V) NOT DETECTED NOT DETECTED Final    Comment: Performed at [facility], [address] [place], [place] [address]  C Difficile Quick Screen w PCR reflex     Status: None   Collection Time: 05/25/24  8:51 AM   Specimen: STOOL  Result Value Ref Range Status   C Diff antigen NEGATIVE NEGATIVE Final   C Diff toxin NEGATIVE NEGATIVE Final   C Diff interpretation No C. difficile detected.  Final    Comment: Performed at [facility], [address], [place], [place] [address]  Culture, blood (Routine X 2) w Reflex to ID Panel     Status: Abnormal   Collection Time: 05/25/24  1:35 PM   Specimen: BLOOD RIGHT ARM  Result Value Ref Range Status   Specimen Description BLOOD RIGHT ARM  Final   Special Requests   Final    BOTTLES DRAWN AEROBIC AND ANAEROBIC Blood Culture results may not be optimal due to an inadequate volume of blood received in  culture bottles   Culture  Setup Time   Final    GRAM POSITIVE COCCI IN CLUSTERS ANAEROBIC BOTTLE ONLY CRITICAL RESULT CALLED TO, READ BACK BY AND VERIFIED WITH: PHARMD J LEDFORD 05/27/2024 @ 0351 BY AB    Culture (A)  Final    STAPHYLOCOCCUS HOMINIS THE SIGNIFICANCE OF ISOLATING THIS ORGANISM FROM A SINGLE SET OF BLOOD CULTURES WHEN MULTIPLE SETS ARE DRAWN IS UNCERTAIN. PLEASE NOTIFY THE MICROBIOLOGY DEPARTMENT WITHIN ONE WEEK IF SPECIATION AND SENSITIVITIES ARE REQUIRED. Performed at [facility], [address], [place], [place] [address]    Report Status 05/29/2024 FINAL  Final  Blood Culture ID Panel (Reflexed)     Status: Abnormal   Collection Time: 05/25/24  1:35 PM  Result Value Ref Range Status   Enterococcus faecalis NOT DETECTED NOT DETECTED Final   Enterococcus Faecium NOT DETECTED NOT DETECTED Final   Listeria monocytogenes NOT DETECTED NOT DETECTED Final   Staphylococcus species DETECTED (A) NOT DETECTED Final    Comment: CRITICAL RESULT CALLED TO, READ BACK BY AND VERIFIED WITH: PHARMD J LEDFORD 05/27/2024 @ 0351 BY AB    Staphylococcus aureus (BCID) NOT DETECTED NOT DETECTED Final   Staphylococcus epidermidis NOT DETECTED NOT DETECTED Final   Staphylococcus lugdunensis NOT DETECTED NOT DETECTED Final   Streptococcus species NOT DETECTED NOT DETECTED Final   Streptococcus agalactiae NOT DETECTED NOT DETECTED Final   Streptococcus pneumoniae NOT DETECTED NOT DETECTED Final   Streptococcus pyogenes NOT DETECTED NOT DETECTED Final   A.calcoaceticus-baumannii NOT DETECTED NOT DETECTED Final   Bacteroides fragilis NOT DETECTED NOT DETECTED Final   Enterobacterales NOT DETECTED NOT DETECTED Final   Enterobacter cloacae complex NOT DETECTED NOT DETECTED Final   Escherichia coli NOT DETECTED NOT DETECTED Final   Klebsiella aerogenes NOT DETECTED NOT DETECTED Final   Klebsiella oxytoca NOT DETECTED NOT DETECTED Final   Klebsiella pneumoniae NOT DETECTED NOT DETECTED  Final   Proteus species NOT DETECTED NOT DETECTED Final   Salmonella species NOT DETECTED NOT DETECTED Final   Serratia marcescens NOT DETECTED NOT DETECTED Final   Haemophilus influenzae NOT DETECTED NOT DETECTED Final   Neisseria meningitidis NOT DETECTED NOT DETECTED Final   Pseudomonas aeruginosa NOT DETECTED NOT DETECTED Final   Stenotrophomonas maltophilia NOT DETECTED NOT DETECTED Final   Candida albicans NOT DETECTED NOT DETECTED Final   Candida auris NOT DETECTED NOT DETECTED Final   Candida glabrata NOT DETECTED NOT DETECTED Final   Candida krusei NOT DETECTED NOT DETECTED Final   Candida parapsilosis NOT DETECTED NOT DETECTED Final   Candida tropicalis NOT DETECTED NOT DETECTED Final   Cryptococcus neoformans/gattii NOT DETECTED NOT DETECTED Final    Comment: Performed at [facility], [address] [place], [place] [address]  Culture, blood (Routine X 2) w Reflex to ID Panel     Status: None   Collection Time: 05/25/24  1:36 PM   Specimen: BLOOD LEFT HAND  Result Value Ref Range Status   Specimen Description BLOOD LEFT HAND  Final   Special Requests   Final    BOTTLES DRAWN AEROBIC AND ANAEROBIC Blood Culture results may not be optimal due to an inadequate volume of blood received in culture bottles   Culture   Final    NO GROWTH 5 DAYS Performed at [facility], [address], [place], [place] [address]    Report Status 05/30/2024 FINAL  Final     Time coordinating discharge: Over 30 minutes  SIGNED:   Rema Care [place], DO  [facility] 05/30/2024, 10:22 AM

## 2024-06-01 ENCOUNTER — Encounter (HOSPITAL_BASED_OUTPATIENT_CLINIC_OR_DEPARTMENT_OTHER): Payer: Self-pay

## 2024-06-01 ENCOUNTER — Ambulatory Visit: Payer: Self-pay

## 2024-06-01 ENCOUNTER — Emergency Department (HOSPITAL_BASED_OUTPATIENT_CLINIC_OR_DEPARTMENT_OTHER)

## 2024-06-01 ENCOUNTER — Other Ambulatory Visit: Payer: Self-pay

## 2024-06-01 ENCOUNTER — Emergency Department (HOSPITAL_BASED_OUTPATIENT_CLINIC_OR_DEPARTMENT_OTHER)
Admission: EM | Admit: 2024-06-01 | Discharge: 2024-06-01 | Disposition: A | Discharge status: Home / Self Care | Attending: Emergency Medicine | Admitting: Emergency Medicine

## 2024-06-01 DIAGNOSIS — R1031 Right lower quadrant pain: Secondary | ICD-10-CM | POA: Insufficient documentation

## 2024-06-01 DIAGNOSIS — Z794 Long term (current) use of insulin: Secondary | ICD-10-CM | POA: Diagnosis not present

## 2024-06-01 DIAGNOSIS — N1832 Chronic kidney disease, stage 3b: Secondary | ICD-10-CM | POA: Insufficient documentation

## 2024-06-01 DIAGNOSIS — I129 Hypertensive chronic kidney disease with stage 1 through stage 4 chronic kidney disease, or unspecified chronic kidney disease: Secondary | ICD-10-CM | POA: Diagnosis not present

## 2024-06-01 DIAGNOSIS — R1011 Right upper quadrant pain: Secondary | ICD-10-CM | POA: Insufficient documentation

## 2024-06-01 DIAGNOSIS — Z87891 Personal history of nicotine dependence: Secondary | ICD-10-CM | POA: Diagnosis not present

## 2024-06-01 DIAGNOSIS — R110 Nausea: Secondary | ICD-10-CM | POA: Insufficient documentation

## 2024-06-01 DIAGNOSIS — Z8673 Personal history of transient ischemic attack (TIA), and cerebral infarction without residual deficits: Secondary | ICD-10-CM | POA: Diagnosis not present

## 2024-06-01 DIAGNOSIS — E1122 Type 2 diabetes mellitus with diabetic chronic kidney disease: Secondary | ICD-10-CM | POA: Diagnosis not present

## 2024-06-01 DIAGNOSIS — R197 Diarrhea, unspecified: Secondary | ICD-10-CM | POA: Diagnosis not present

## 2024-06-01 DIAGNOSIS — R109 Unspecified abdominal pain: Secondary | ICD-10-CM

## 2024-06-01 LAB — URINALYSIS, ROUTINE W REFLEX MICROSCOPIC
Bacteria, UA: NONE SEEN
Bilirubin Urine: NEGATIVE
Glucose, UA: NEGATIVE mg/dL
Hgb urine dipstick: NEGATIVE
Ketones, ur: NEGATIVE mg/dL
Leukocytes,Ua: NEGATIVE
Nitrite: NEGATIVE
Protein, ur: 100 mg/dL — AB
Specific Gravity, Urine: 1.026 (ref 1.005–1.030)
pH: 6 (ref 5.0–8.0)

## 2024-06-01 LAB — COMPREHENSIVE METABOLIC PANEL WITH GFR
ALT: 17 U/L (ref 0–44)
AST: 28 U/L (ref 15–41)
Albumin: 3.1 g/dL — ABNORMAL LOW (ref 3.5–5.0)
Alkaline Phosphatase: 118 U/L (ref 38–126)
Anion gap: 11 (ref 5–15)
BUN: 14 mg/dL (ref 6–20)
CO2: 20 mmol/L — ABNORMAL LOW (ref 22–32)
Calcium: 8.3 mg/dL — ABNORMAL LOW (ref 8.9–10.3)
Chloride: 108 mmol/L (ref 98–111)
Creatinine, Ser: 1.75 mg/dL — ABNORMAL HIGH (ref 0.44–1.00)
GFR, Estimated: 36 mL/min — ABNORMAL LOW (ref >60)
Glucose, Bld: 103 mg/dL — ABNORMAL HIGH (ref 70–99)
Potassium: 4 mmol/L (ref 3.5–5.1)
Sodium: 139 mmol/L (ref 135–145)
Total Bilirubin: 0.4 mg/dL (ref 0.0–1.2)
Total Protein: 6.9 g/dL (ref 6.5–8.1)

## 2024-06-01 LAB — CBC
HCT: 32.2 % — ABNORMAL LOW (ref 36.0–46.0)
Hemoglobin: 10.5 g/dL — ABNORMAL LOW (ref 12.0–15.0)
MCH: 27.9 pg (ref 26.0–34.0)
MCHC: 32.6 g/dL (ref 30.0–36.0)
MCV: 85.4 fL (ref 80.0–100.0)
Platelets: 434 10*3/uL — ABNORMAL HIGH (ref 150–400)
RBC: 3.77 MIL/uL — ABNORMAL LOW (ref 3.87–5.11)
RDW: 15.7 % — ABNORMAL HIGH (ref 11.5–15.5)
WBC: 11.5 10*3/uL — ABNORMAL HIGH (ref 4.0–10.5)
nRBC: 0 % (ref 0.0–0.2)

## 2024-06-01 LAB — LIPASE, BLOOD: Lipase: 11 U/L (ref 11–51)

## 2024-06-01 MED ORDER — LACTATED RINGERS IV BOLUS
1000.0000 mL | Freq: Once | INTRAVENOUS | Status: AC
  Administered 2024-06-01: 1000 mL via INTRAVENOUS

## 2024-06-01 MED ORDER — OXYCODONE HCL 5 MG PO TABS: 5.0000 mg | ORAL_TABLET | ORAL | 0 refills | Status: DC | PRN

## 2024-06-01 MED ORDER — ONDANSETRON 4 MG PO TBDP: 4.0000 mg | ORAL_TABLET | Freq: Three times a day (TID) | ORAL | 0 refills | Status: DC | PRN

## 2024-06-01 MED ORDER — HYDROMORPHONE HCL 1 MG/ML IJ SOLN
1.0000 mg | Freq: Once | INTRAMUSCULAR | Status: AC
  Administered 2024-06-01: 1 mg via INTRAVENOUS
  Filled 2024-06-01: qty 1

## 2024-06-01 MED ORDER — IOHEXOL 300 MG/ML  SOLN
100.0000 mL | Freq: Once | INTRAMUSCULAR | Status: AC | PRN
Start: 2024-06-01 — End: 2024-06-01
  Administered 2024-06-01: 80 mL via INTRAVENOUS

## 2024-06-01 NOTE — ED Triage Notes (Signed)
 Pt c/o R sided abd pain, nausea, diarrhea that never really stopped from before. Recently hospitalized 6-12 to 6/18 w salmonella   Zofran  PTA

## 2024-06-01 NOTE — Discharge Instructions (Addendum)
 You were seen for abdominal pain nausea and diarrhea.  You had a CT done of your abdomen pelvis which was negative for anything new going on.  This most likely your symptoms are due to the recent Salmonella infection you had that would likely take some time to get better.  I am sending more Zofran  and oxycodone  to your pharmacy to use as needed for persistent symptoms.  Please follow-up with your PCP next week to ensure your symptoms are resolving appropriately.  Please stay well-hydrated at home and focus on foods that are not spicy or acidic including carbonated beverages.  Please return to the ED if you have severely worsening symptoms or develop a new persistent fever.

## 2024-06-01 NOTE — Telephone Encounter (Signed)
 1st attempt, left message to call back. Has new pt appointment/hosp follow up scheduled July 3rd.   Message from Adrianna P sent at 06/01/2024  2:03 PM EDT  Diarrhea, abdominal pain. Please call [phone]

## 2024-06-01 NOTE — ED Provider Notes (Cosign Needed)
 [place] EMERGENCY DEPARTMENT AT [facility] Provider Note  CSN: [id number] Arrival date & time: 06/01/24 1525  Chief Complaint(s) Abdominal Pain  HPI Virginia [name] is a 48 y.o. female with PMHx T2DM, CKD3b, anemia, anxiety, GERD, gastric bypass, CVA presenting with abdominal pain, nausea and diarrhea.  Reports her symptoms were improving at the time of discharge on 6/18 but then her abdominal pain began to worsen last night and throughout the day today.  Pain is more localized to the right side of her abdomen in the upper part has felt more full and distended.  Reports her diarrhea has stayed about the same, still having continued diarrhea output that is nonbloody.  Reports persistent nausea but has been able to keep down some liquids and bland foods.  Denies dysuria, urinary frequency or hematuria.  Denies fever.  Does have prior history of gastric bypass and cholecystectomy, has some chronic diarrhea at baseline.  Has been taking Zofran  consistently at home.  Patient recently hospitalized at [facility] from 6/12 to 6/18 for Salmonella enteritis.  She received IV fluids and ceftriaxone  x 5-day course.  Her diet was slowly advanced and diarrhea improved at the time of discharge.  Past Medical History Past Medical History:  Diagnosis Date   Anxiety    Arthritis    Bipolar affective (HCC)    Depression    Diabetes mellitus    Hypertension    Renal disorder    Patient Active Problem List   Diagnosis Date Noted   Salmonella enteritis 05/25/2024   Acute renal failure (ARF) (HCC) 05/24/2024   Influenza A 02/04/2024   Syncope 02/04/2024   Acute cystitis 02/04/2024   GAD (generalized anxiety disorder) 02/04/2024   Anemia of chronic disease 02/04/2024   Generalized weakness 02/03/2024   Acute CVA (cerebrovascular accident) (HCC) 05/24/2023   Dizziness 05/23/2023   Subacute cough 03/03/2023   Allergic rhinitis 03/03/2023   Mild intermittent asthma with acute  exacerbation 03/03/2023   SOB (shortness of breath) 03/01/2023   Normocytic anemia 03/01/2023   Leukocytosis 03/01/2023   Acute renal failure superimposed on stage 3b chronic kidney disease (HCC) 07/13/2022   Urinary tract infection without hematuria    Dehydration    Gastroesophageal reflux disease    Class 2 obesity due to excess calories with body mass index (BMI) of 39.0 to 39.9 in adult    Hypokalemia    Hypomagnesemia    Anxiety 02/20/2017   DM2 (diabetes mellitus, type 2) (HCC) 02/20/2017   Sepsis (HCC) 02/20/2017   Abdominal pain 02/20/2017   Nausea & vomiting 02/20/2017   Tobacco abuse 02/20/2017   Flank pain 02/20/2017   Hypertension    Lactic acidosis    Home Medication(s) Prior to Admission medications   Medication Sig Start Date End Date Taking? Authorizing Provider  ondansetron  (ZOFRAN -ODT) 4 MG disintegrating tablet Take 1 tablet (4 mg total) by mouth every 8 (eight) hours as needed for nausea. 06/01/24  Yes Jonne Netters, MD  oxyCODONE  (ROXICODONE ) 5 MG immediate release tablet Take 1 tablet (5 mg total) by mouth every 4 (four) hours as needed for severe pain (pain score 7-10). 06/01/24  Yes Jonne Netters, MD  acetaminophen  (TYLENOL ) 325 MG tablet Take 2 tablets (650 mg total) by mouth every 6 (six) hours as needed for mild pain (pain score 1-3) (or Fever >/= 101). 02/07/24   Sheikh, Omair Latif, DO  albuterol  (VENTOLIN  HFA) 108 (90 Base) MCG/ACT inhaler Inhale 2 puffs every 4 (four) hours as needed into the  lungs for wheezing or shortness of breath.    [provider]  ALPRAZolam  (XANAX ) 1 MG tablet Take 1 mg by mouth 3 (three) times daily as needed for anxiety. 02/26/20   [provider]  benzonatate  (TESSALON ) 200 MG capsule Take 1 capsule (200 mg total) by mouth 3 (three) times daily as needed for cough. 02/07/24   Sheikh, Omair Latif, DO  Blood Glucose Monitoring Suppl DEVI 1 each by Does not apply route 3 (three) times daily. May dispense any  manufacturer covered by patient's insurance. 02/07/24   Aura Leeds Latif, DO  Continuous Glucose Sensor (FREESTYLE LIBRE 3 SENSOR) MISC Place 1 sensor on the skin every 14 days. Use to check glucose continuously 05/25/23   Sheril Dines, MD  erythromycin ophthalmic ointment Place 1 Application into the left eye 3 (three) times daily. 05/20/24   [provider]  feeding supplement (ENSURE ENLIVE / ENSURE PLUS) LIQD Take 237 mLs by mouth 2 (two) times daily between meals. 02/07/24   Aura Leeds Latif, DO  ferrous sulfate  325 (65 FE) MG tablet Take 1 tablet (325 mg total) by mouth 2 (two) times daily with a meal. 05/30/24 08/28/24  [place], Rema Care, DO  FLUoxetine  (PROZAC ) 40 MG capsule Take 40 mg by mouth daily. 12/21/19   [provider]  fluticasone  (FLONASE ) 50 MCG/ACT nasal spray Place 1 spray into both nostrils at bedtime. 05/25/23 02/03/24  Sheril Dines, MD  Glucose Blood (BLOOD GLUCOSE TEST STRIPS) STRP 1 each by Does not apply route 3 (three) times daily. Use as directed to check blood sugar. May dispense any manufacturer covered by patient's insurance and fits patient's device. 02/07/24   Aura Leeds Latif, DO  insulin  glargine (LANTUS ) 100 UNIT/ML Solostar Pen Inject 10 Units into the skin daily. 02/07/24   Sheikh, Omair Latif, DO  Insulin  Pen Needle (PEN NEEDLES) 31G X 5 MM MISC 1 each by Does not apply route 3 (three) times daily. May dispense any manufacturer covered by patient's insurance. 02/07/24   Eveline Hipps, DO  Lancet Device MISC 1 each by Does not apply route 3 (three) times daily. May dispense any manufacturer covered by patient's insurance. 02/07/24   Aura Leeds Latif, DO  Lancets MISC 1 each by Does not apply route 3 (three) times daily. Use as directed to check blood sugar. May dispense any manufacturer covered by patient's insurance and fits patient's device. 02/07/24   Sheikh, Omair Latif, DO  Multiple Vitamin (MULTIVITAMIN WITH MINERALS) TABS tablet Take 1  tablet by mouth daily. 02/08/24   Sheikh, Omair Latif, DO  ofloxacin (OCUFLOX) 0.3 % ophthalmic solution Place 1 drop into the left eye 4 (four) times daily. 05/07/24   [provider]  pantoprazole  (PROTONIX ) 40 MG tablet Take 1 tablet (40 mg total) by mouth daily. 05/30/24 08/28/24  [place], Rema Care, DO  saccharomyces boulardii (FLORASTOR) 250 MG capsule Take 1 capsule (250 mg total) by mouth 2 (two) times daily for 14 days. 05/30/24 06/13/24  [place], Eric J, DO  Past Surgical History Past Surgical History:  Procedure Laterality Date   CESAREAN SECTION     CHOLECYSTECTOMY     GASTRIC BYPASS     KNEE SURGERY     Family History Family History  Problem Relation Age of Onset   Asthma Mother    Hypertension Mother    Hypertension Father    Diabetes Mellitus II Father     Social History Social History   Tobacco Use   Smoking status: Former    Current packs/day: 0.00    Types: Cigarettes    Quit date: 02/20/2013    Years since quitting: 11.2   Smokeless tobacco: Never  Substance Use Topics   Alcohol use: Yes    Comment: occasional   Drug use: No   Allergies Ace inhibitors, Banana, Lamictal [lamotrigine], Morphine , Nsaids, and Topiramate  Review of Systems A thorough review of systems was obtained and all systems are negative except as noted in the HPI and PMH.   Physical Exam Vital Signs  I have reviewed the triage vital signs BP (!) 179/81   Pulse 70   Temp 98 F (36.7 C)   Resp 18   SpO2 100%  Physical Exam  General: Sitting up, leaning over in bed clutching her abdomen.  Mild distress. Eyes: PERRLA, anicteric sclera ENTM: Dry lips, moist oral mucosa. Neck: Supple, non-tender Cardiovascular: RRR without murmur Respiratory: CTAB. Normal WOB on RA Gastrointestinal: Soft, nondistended.  TTP over right upper and right lower quadrant  with some extension centrally.  No rebound tenderness or guarding. MSK: No peripheral edema Derm: Warm, dry, no rashes noted Neuro: Motor and sensation intact globally Psych: Cooperative, pleasant   ED Results and Treatments Labs (all labs ordered are listed, but only abnormal results are displayed) Labs Reviewed  COMPREHENSIVE METABOLIC PANEL WITH GFR - Abnormal; Notable for the following components:      Result Value   CO2 20 (*)    Glucose, Bld 103 (*)    Creatinine, Ser 1.75 (*)    Calcium  8.3 (*)    Albumin 3.1 (*)    GFR, Estimated 36 (*)    All other components within normal limits  CBC - Abnormal; Notable for the following components:   WBC 11.5 (*)    RBC 3.77 (*)    Hemoglobin 10.5 (*)    HCT 32.2 (*)    RDW 15.7 (*)    Platelets 434 (*)    All other components within normal limits  URINALYSIS, ROUTINE W REFLEX MICROSCOPIC - Abnormal; Notable for the following components:   Protein, ur 100 (*)    All other components within normal limits  LIPASE, BLOOD                                                                                                                          Radiology CT ABDOMEN PELVIS W CONTRAST Result Date: 06/01/2024 CLINICAL DATA:  Right-sided abdominal pain and diarrhea EXAM: CT ABDOMEN AND PELVIS WITH  CONTRAST TECHNIQUE: Multidetector CT imaging of the abdomen and pelvis was performed using the standard protocol following bolus administration of intravenous contrast. RADIATION DOSE REDUCTION: This exam was performed according to the departmental dose-optimization program which includes automated exposure control, adjustment of the mA and/or kV according to patient size and/or use of iterative reconstruction technique. CONTRAST:  80mL OMNIPAQUE  IOHEXOL  300 MG/ML  SOLN COMPARISON:  CT abdomen and pelvis dated 05/24/2024 FINDINGS: Lower chest: No focal consolidation or pulmonary nodule in the lung bases. No pleural effusion or pneumothorax demonstrated.  Partially imaged heart size is normal. Hepatobiliary: No focal hepatic lesions. No intra or extrahepatic biliary ductal dilation. Cholecystectomy. Pancreas: No focal lesions or main ductal dilation. Spleen: Normal in size without focal abnormality. Adrenals/Urinary Tract: No adrenal nodules. No suspicious renal mass, calculi or hydronephrosis. Urinary bladder is underdistended. Stomach/Bowel: Postsurgical changes of Roux-en-Y gastric bypass. No evidence of bowel wall thickening, distention, or inflammatory changes. Normal appendix. Vascular/Lymphatic: No significant vascular findings are present. No enlarged abdominal or pelvic lymph nodes. Reproductive: No adnexal masses. Other: Trace pelvic free fluid.  No free air or fluid collection. Musculoskeletal: No acute or abnormal lytic or blastic osseous lesions. Mild body wall edema. IMPRESSION: 1. No acute abdominopelvic findings. 2. Postsurgical changes of Roux-en-Y gastric bypass. 3. Mild body wall edema. Electronically Signed   By: Limin  Xu M.D.   On: 06/01/2024 19:36    Pertinent labs & imaging results that were available during my care of the patient were reviewed by me and considered in my medical decision making (see MDM for details).  Medications Ordered in ED Medications  lactated ringers  bolus 1,000 mL (0 mLs Intravenous Stopped 06/01/24 1848)  iohexol  (OMNIPAQUE ) 300 MG/ML solution 100 mL (80 mLs Intravenous Contrast Given 06/01/24 1735)  HYDROmorphone  (DILAUDID ) injection 1 mg (1 mg Intravenous Given 06/01/24 1746)                                                                   Medical Decision Making / ED Course    Medical Decision Making:    Virginia [name] is a 48 y.o. female with PMHx T2DM, CKD3b, anemia, anxiety, GERD, gastric bypass, CVA presenting with abdominal pain, nausea and diarrhea. The complaint involves an extensive differential diagnosis and also carries with it a high risk of complications and morbidity.  Serious  etiology was considered. Ddx includes but is not limited to: Enteritis, diverticulitis, appendicitis, remnant cholecystitis  Complete initial physical exam performed, notably the patient was in intermittent distress.    Reviewed and confirmed nursing documentation for past medical history, family history, social history.  Vital signs reviewed.     Brief summary:  48 year old female with history as above presenting with worsening right-sided abdominal pain, nausea and diarrhea in the setting of recent Salmonella enteritis requiring hospitalization 6/12 and 6/18.  Given newly worsening right sided abdominal pain concern for secondary etiologies such as appendicitis or complication from prior Salmonella infection.  Also possibly sequela of recent infection.  Will obtain CTAP to rule out acute process.  Lab work significant for mild leukocytosis and SCr consistent with baseline CKD3b.   Clinical Course as of 06/01/24 2026  Fri Jun 01, 2024  2025 CTAP negative for acute process.  Urine negative for UTI.  Presentation most likely consistent with sequelae of recent Salmonella infection. Symptomatically improved with fluids and pain management.  Will discharge with medication for symptom management recommend oral hydration and follow-up with PCP for persistent symptoms [KH]    Clinical Course User Index [KH] Jonne Netters, MD    Additional history obtained: -External records from outside source obtained and reviewed including: Chart review including previous notes, labs, imaging, consultation notes including: Prior hospitalization records   Lab Tests: -I ordered, reviewed, and interpreted labs.   The pertinent results include:   Labs Reviewed  COMPREHENSIVE METABOLIC PANEL WITH GFR - Abnormal; Notable for the following components:      Result Value   CO2 20 (*)    Glucose, Bld 103 (*)    Creatinine, Ser 1.75 (*)    Calcium  8.3 (*)    Albumin 3.1 (*)    GFR, Estimated 36 (*)    All  other components within normal limits  CBC - Abnormal; Notable for the following components:   WBC 11.5 (*)    RBC 3.77 (*)    Hemoglobin 10.5 (*)    HCT 32.2 (*)    RDW 15.7 (*)    Platelets 434 (*)    All other components within normal limits  URINALYSIS, ROUTINE W REFLEX MICROSCOPIC - Abnormal; Notable for the following components:   Protein, ur 100 (*)    All other components within normal limits  LIPASE, BLOOD     EKG   EKG Interpretation Date/Time:    Ventricular Rate:    PR Interval:    QRS Duration:    QT Interval:    QTC Calculation:   R Axis:      Text Interpretation:           Imaging Studies ordered: I ordered imaging studies including  CT ABDOMEN PELVIS W CONTRAST Result Date: 06/01/2024 CLINICAL DATA:  Right-sided abdominal pain and diarrhea EXAM: CT ABDOMEN AND PELVIS WITH CONTRAST TECHNIQUE: Multidetector CT imaging of the abdomen and pelvis was performed using the standard protocol following bolus administration of intravenous contrast. RADIATION DOSE REDUCTION: This exam was performed according to the departmental dose-optimization program which includes automated exposure control, adjustment of the mA and/or kV according to patient size and/or use of iterative reconstruction technique. CONTRAST:  80mL OMNIPAQUE  IOHEXOL  300 MG/ML  SOLN COMPARISON:  CT abdomen and pelvis dated 05/24/2024 FINDINGS: Lower chest: No focal consolidation or pulmonary nodule in the lung bases. No pleural effusion or pneumothorax demonstrated. Partially imaged heart size is normal. Hepatobiliary: No focal hepatic lesions. No intra or extrahepatic biliary ductal dilation. Cholecystectomy. Pancreas: No focal lesions or main ductal dilation. Spleen: Normal in size without focal abnormality. Adrenals/Urinary Tract: No adrenal nodules. No suspicious renal mass, calculi or hydronephrosis. Urinary bladder is underdistended. Stomach/Bowel: Postsurgical changes of Roux-en-Y gastric bypass. No  evidence of bowel wall thickening, distention, or inflammatory changes. Normal appendix. Vascular/Lymphatic: No significant vascular findings are present. No enlarged abdominal or pelvic lymph nodes. Reproductive: No adnexal masses. Other: Trace pelvic free fluid.  No free air or fluid collection. Musculoskeletal: No acute or abnormal lytic or blastic osseous lesions. Mild body wall edema. IMPRESSION: 1. No acute abdominopelvic findings. 2. Postsurgical changes of Roux-en-Y gastric bypass. 3. Mild body wall edema. Electronically Signed   By: Limin  Xu M.D.   On: 06/01/2024 19:36    I independently visualized the following imaging with scope of interpretation limited to determining acute life threatening conditions related to emergency care; findings noted above I  agree with the radiologist interpretation If any imaging was obtained with contrast I closely monitored patient for any possible adverse reaction a/w contrast administration in the emergency department   Medicines ordered and prescription drug management: Meds ordered this encounter  Medications   lactated ringers  bolus 1,000 mL   iohexol  (OMNIPAQUE ) 300 MG/ML solution 100 mL   HYDROmorphone  (DILAUDID ) injection 1 mg   ondansetron  (ZOFRAN -ODT) 4 MG disintegrating tablet    Sig: Take 1 tablet (4 mg total) by mouth every 8 (eight) hours as needed for nausea.    Dispense:  10 tablet    Refill:  0   oxyCODONE  (ROXICODONE ) 5 MG immediate release tablet    Sig: Take 1 tablet (5 mg total) by mouth every 4 (four) hours as needed for severe pain (pain score 7-10).    Dispense:  10 tablet    Refill:  0    -I have reviewed the patients home medicines and have made adjustments as needed  Reevaluation: After the interventions noted above, I reevaluated the patient and found that they have improved  Co morbidities that complicate the patient evaluation  Past Medical History:  Diagnosis Date   Anxiety    Arthritis    Bipolar affective  (HCC)    Depression    Diabetes mellitus    Hypertension    Renal disorder       Dispostion: Disposition decision including need for hospitalization was considered, and patient discharged from emergency department.    Final Clinical Impression(s) / ED Diagnoses Final diagnoses:  Abdominal pain, unspecified abdominal location        Jonne Netters, MD 06/01/24 2027

## 2024-06-01 NOTE — Telephone Encounter (Signed)
 1st attempt, left message to call back. Has new pt appointment/hosp follow up scheduled July 3rd.   Message from Adrianna P sent at 06/01/2024  2:03 PM EDT  Diarrhea, abdominal pain. Please call [phone]   Reason for Disposition  [1] SEVERE pain (e.g., excruciating) AND [2] present > 1 hour  Answer Assessment - Initial Assessment Questions 1. LOCATION: Where does it hurt?      Upper abdomen 2. RADIATION: Does the pain shoot anywhere else? (e.g., chest, back)     Radiating around the right to the back  3. ONSET: When did the pain begin? (e.g., minutes, hours or days ago)      Yesterday  4. SUDDEN: Gradual or sudden onset?     Sudden  5. PATTERN Does the pain come and go, or is it constant?    - If it comes and goes: How long does it last? Do you have pain now?     (Note: Comes and goes means the pain is intermittent. It goes away completely between bouts.)    - If constant: Is it getting better, staying the same, or getting worse?      (Note: Constant means the pain never goes away completely; most serious pain is constant and gets worse.)      Constant  6. SEVERITY: How bad is the pain?  (e.g., Scale 1-10; mild, moderate, or severe)    - MILD (1-3): Doesn't interfere with normal activities, abdomen soft and not tender to touch..     - MODERATE (4-7): Interferes with normal activities or awakens from sleep, abdomen tender to touch.     - SEVERE (8-10): Excruciating pain, doubled over, unable to do any normal activities.       8/10 7. RECURRENT SYMPTOM: Have you ever had this type of stomach pain before? If Yes, ask: When was the last time? and What happened that time?      Recently treated for salmonella  8. AGGRAVATING FACTORS: Does anything seem to cause this pain? (e.g., foods, stress, alcohol)     Unsure  9. CARDIAC SYMPTOMS: Do you have any of the following symptoms: chest pain, difficulty breathing, sweating, nausea?     No 10. OTHER SYMPTOMS:  Do you have any other symptoms? (e.g., back pain, diarrhea, fever, urination pain, vomiting)       Nausea, diarrhea  Protocols used: Abdominal Pain - Upper-A-AH   FYI Only or Action Required?: FYI only for provider.  Called Nurse Triage reporting Abdominal Pain. Symptoms began yesterday. Interventions attempted: Prescription medications: Taken medication recently prescribed at her previous ED visit for the same. Symptoms are: gradually worsening.  Triage Disposition: Go to ED Now (Notify PCP)  Patient/caregiver understands and will follow disposition?: Yes

## 2024-06-14 ENCOUNTER — Ambulatory Visit: Payer: Self-pay | Admitting: Adult Health

## 2024-06-14 ENCOUNTER — Encounter: Payer: Self-pay | Admitting: Adult Health

## 2024-06-14 ENCOUNTER — Ambulatory Visit: Admitting: Adult Health

## 2024-06-14 VITALS — BP 132/78 | HR 99 | Temp 97.2°F | Resp 18 | Ht 60.98 in | Wt 170.0 lb

## 2024-06-14 DIAGNOSIS — N1832 Chronic kidney disease, stage 3b: Secondary | ICD-10-CM

## 2024-06-14 DIAGNOSIS — D509 Iron deficiency anemia, unspecified: Secondary | ICD-10-CM

## 2024-06-14 DIAGNOSIS — K219 Gastro-esophageal reflux disease without esophagitis: Secondary | ICD-10-CM

## 2024-06-14 DIAGNOSIS — E559 Vitamin D deficiency, unspecified: Secondary | ICD-10-CM

## 2024-06-14 DIAGNOSIS — E1122 Type 2 diabetes mellitus with diabetic chronic kidney disease: Secondary | ICD-10-CM

## 2024-06-14 DIAGNOSIS — R300 Dysuria: Secondary | ICD-10-CM

## 2024-06-14 DIAGNOSIS — N3000 Acute cystitis without hematuria: Secondary | ICD-10-CM

## 2024-06-14 DIAGNOSIS — N390 Urinary tract infection, site not specified: Secondary | ICD-10-CM

## 2024-06-14 DIAGNOSIS — Z6832 Body mass index (BMI) 32.0-32.9, adult: Secondary | ICD-10-CM

## 2024-06-14 DIAGNOSIS — Z7689 Persons encountering health services in other specified circumstances: Secondary | ICD-10-CM

## 2024-06-14 DIAGNOSIS — Z1211 Encounter for screening for malignant neoplasm of colon: Secondary | ICD-10-CM

## 2024-06-14 DIAGNOSIS — F411 Generalized anxiety disorder: Secondary | ICD-10-CM

## 2024-06-14 DIAGNOSIS — Z113 Encounter for screening for infections with a predominantly sexual mode of transmission: Secondary | ICD-10-CM

## 2024-06-14 DIAGNOSIS — Z794 Long term (current) use of insulin: Secondary | ICD-10-CM

## 2024-06-14 DIAGNOSIS — F339 Major depressive disorder, recurrent, unspecified: Secondary | ICD-10-CM | POA: Diagnosis not present

## 2024-06-14 DIAGNOSIS — E66811 Obesity, class 1: Secondary | ICD-10-CM

## 2024-06-14 DIAGNOSIS — Z1212 Encounter for screening for malignant neoplasm of rectum: Secondary | ICD-10-CM

## 2024-06-14 DIAGNOSIS — Z1231 Encounter for screening mammogram for malignant neoplasm of breast: Secondary | ICD-10-CM

## 2024-06-14 LAB — POCT URINALYSIS DIPSTICK (MANUAL)
Nitrite, UA: POSITIVE — AB
Poct Bilirubin: NEGATIVE
Poct Blood: 250 — AB
Poct Glucose: NORMAL mg/dL
Poct Ketones: NEGATIVE
Poct Urobilinogen: NORMAL mg/dL
Spec Grav, UA: >=1.03 — AB (ref 1.010–1.025)
pH, UA: 6 (ref 5.0–8.0)

## 2024-06-14 MED ORDER — FREESTYLE LIBRE 3 SENSOR MISC: 0 refills | Status: DC

## 2024-06-14 MED ORDER — SULFAMETHOXAZOLE-TRIMETHOPRIM 800-160 MG PO TABS
1.0000 | ORAL_TABLET | Freq: Two times a day (BID) | ORAL | 0 refills | Status: AC
Start: 2024-06-14 — End: 2024-06-19

## 2024-06-14 MED ORDER — INSULIN GLARGINE 100 UNIT/ML SOLOSTAR PEN: 13.0000 [IU] | PEN_INJECTOR | Freq: Every evening | SUBCUTANEOUS | 0 refills | Status: DC

## 2024-06-14 MED ORDER — OXYCODONE HCL 5 MG PO TABS
5.0000 mg | ORAL_TABLET | Freq: Every day | ORAL | 0 refills | Status: AC | PRN
Start: 2024-06-14 — End: 2024-06-19

## 2024-06-14 NOTE — Progress Notes (Signed)
 [facility] clinic  Provider:  Jereld Serum DNP  Code Status:  Full Code  Goals of Care:     06/01/2024    7:09 PM  Advanced Directives  Does Patient Have a Medical Advance Directive? No  Would patient like information on creating a medical advance directive? No - Patient declined     Chief Complaint  Patient presents with   Establish Care    New patient     Discussed the use of AI scribe software for clinical note transcription with the patient, who gave verbal consent to proceed  HPI: Patient is a 48 y.o. female seen today to establish care with [facility].  She has been experiencing symptoms suggestive of a urinary tract infection for the past three days, including dysuria, urinary frequency, and cloudy urine. The pain is described as excruciating and became slightly unbearable yesterday. She also has back pain, which she suspects might be related to her kidneys.  She has a history of type 2 diabetes, diagnosed at age 78, and is currently on Lantus  insulin , taking 13 units nightly. Her last A1c was 8.1, which is an improvement from previous levels. She has experienced episodes of nocturnal hypoglycemia, leading her to adjust her insulin  dose recently. She also has diabetic neuropathy affecting her feet and hands.  She has chronic kidney disease stage 3B and was recently hospitalized for acute renal failure and salmonella poisoning, which she attributes to possibly contaminated tomatoes. During her hospitalization, she experienced severe diarrhea and abdominal pain, which have since improved.  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 Post-surgery, she has limited vision in her left eye but reports improvement in her right eye following laser treatment. She was hospitalized last year for a stroke but reports no residual weakness.  Her current medications include Prozac  40 mg daily for moderate depression, Protonix  40 mg daily for acid reflux, and Florastor as a  probiotic. She also uses a continuous glucose monitor and has requested a refill for her glucose monitoring supplies.  She has a family history of diabetes and hypertension. Her father is diabetic and has hypertension, while her mother has hypertension and vagal nerve syncope. She has a younger sister who is healthy.  Socially, she is in a relationship and has one son. 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 She reports a significant improvement in her diet, focusing on low carbohydrates and increased vegetable intake, but does not engage in regular exercise. She consumes alcohol occasionally and does not smoke or use drugs.    Past Medical History:  Diagnosis Date   Anxiety    Arthritis    Bipolar affective (HCC)    Depression    Diabetes mellitus    Hypertension    Renal disorder     Past Surgical History:  Procedure Laterality Date   CESAREAN SECTION     CHOLECYSTECTOMY     COLONOSCOPY     GASTRIC BYPASS     KNEE SURGERY      Allergies  Allergen Reactions   Ace Inhibitors Nausea And Vomiting and Cough   Banana Nausea And Vomiting   Lamictal [Lamotrigine] Nausea And Vomiting   Morphine  Itching   Nsaids Other (See Comments)    Kidney failure   Topiramate Nausea And Vomiting    Outpatient Encounter Medications as of 06/14/2024  Medication Sig   acetaminophen  (TYLENOL ) 325 MG tablet Take 2 tablets (650 mg total) by mouth every 6 (six)  hours as needed for mild pain (pain score 1-3) (or Fever >/= 101).   albuterol  (VENTOLIN  HFA) 108 (90 Base) MCG/ACT inhaler Inhale 2 puffs every 4 (four) hours as needed into the lungs for wheezing or shortness of breath.   ALPRAZolam  (XANAX ) 1 MG tablet Take 1 mg by mouth 3 (three) times daily as needed for anxiety.   Blood Glucose Monitoring Suppl DEVI 1 each by Does not apply route 3 (three) times daily. May dispense any manufacturer covered by patient's insurance.   FLUoxetine  (PROZAC ) 40 MG  capsule Take 40 mg by mouth daily.   Glucose Blood (BLOOD GLUCOSE TEST STRIPS) STRP 1 each by Does not apply route 3 (three) times daily. Use as directed to check blood sugar. May dispense any manufacturer covered by patient's insurance and fits patient's device.   Insulin  Pen Needle (PEN NEEDLES) 31G X 5 MM MISC 1 each by Does not apply route 3 (three) times daily. May dispense any manufacturer covered by patient's insurance.   Lancet Device MISC 1 each by Does not apply route 3 (three) times daily. May dispense any manufacturer covered by patient's insurance.   Lancets MISC 1 each by Does not apply route 3 (three) times daily. Use as directed to check blood sugar. May dispense any manufacturer covered by patient's insurance and fits patient's device.   Multiple Vitamin (MULTIVITAMIN WITH MINERALS) TABS tablet Take 1 tablet by mouth daily.   ondansetron  (ZOFRAN -ODT) 4 MG disintegrating tablet Take 1 tablet (4 mg total) by mouth every 8 (eight) hours as needed for nausea.   oxyCODONE  (ROXICODONE ) 5 MG immediate release tablet Take 1 tablet (5 mg total) by mouth daily as needed for up to 5 days for severe pain (pain score 7-10).   pantoprazole  (PROTONIX ) 40 MG tablet Take 1 tablet (40 mg total) by mouth daily.   saccharomyces boulardii (FLORASTOR) 250 MG capsule Take 1 capsule (250 mg total) by mouth 2 (two) times daily for 14 days.   sulfamethoxazole -trimethoprim  (BACTRIM  DS) 800-160 MG tablet Take 1 tablet by mouth 2 (two) times daily for 5 days.   [DISCONTINUED] benzonatate  (TESSALON ) 200 MG capsule Take 1 capsule (200 mg total) by mouth 3 (three) times daily as needed for cough.   [DISCONTINUED] Continuous Glucose Sensor (FREESTYLE LIBRE 3 SENSOR) MISC Place 1 sensor on the skin every 14 days. Use to check glucose continuously   [DISCONTINUED] feeding supplement (ENSURE ENLIVE / ENSURE PLUS) LIQD Take 237 mLs by mouth 2 (two) times daily between meals.   [DISCONTINUED] insulin  glargine (LANTUS ) 100  UNIT/ML Solostar Pen Inject 10 Units into the skin daily.   Continuous Glucose Sensor (FREESTYLE LIBRE 3 SENSOR) MISC Place 1 sensor on the skin every 14 days. Use to check glucose continuously   insulin  glargine (LANTUS ) 100 UNIT/ML Solostar Pen Inject 13 Units into the skin every evening.   [DISCONTINUED] erythromycin ophthalmic ointment Place 1 Application into the left eye 3 (three) times daily. (Patient not taking: Reported on 06/14/2024)   [DISCONTINUED] ferrous sulfate  325 (65 FE) MG tablet Take 1 tablet (325 mg total) by mouth 2 (two) times daily with a meal. (Patient not taking: Reported on 06/14/2024)   [DISCONTINUED] fluticasone  (FLONASE ) 50 MCG/ACT nasal spray Place 1 spray into both nostrils at bedtime. (Patient not taking: Reported on 06/14/2024)   [DISCONTINUED] ofloxacin (OCUFLOX) 0.3 % ophthalmic solution Place 1 drop into the left eye 4 (four) times daily. (Patient not taking: Reported on 06/14/2024)   [DISCONTINUED] oxyCODONE  (ROXICODONE ) 5 MG immediate release  tablet Take 1 tablet (5 mg total) by mouth every 4 (four) hours as needed for severe pain (pain score 7-10). (Patient not taking: Reported on 06/14/2024)   No facility-administered encounter medications on file as of 06/14/2024.    Review of Systems:  Review of Systems  Constitutional:  Negative for appetite change, chills, fatigue and fever.  HENT:  Negative for congestion, hearing loss, rhinorrhea and sore throat.   Eyes: Negative.   Respiratory:  Negative for cough, shortness of breath and wheezing.   Cardiovascular:  Negative for chest pain, palpitations and leg swelling.  Gastrointestinal:  Positive for abdominal pain. Negative for constipation, diarrhea, nausea and vomiting.       RLQ abdominal pain  Genitourinary:  Positive for dysuria and frequency.  Musculoskeletal:  Positive for back pain. Negative for arthralgias and myalgias.  Skin:  Negative for color change, rash and wound.  Neurological:  Negative for dizziness,  weakness and headaches.  Psychiatric/Behavioral:  Negative for behavioral problems. The patient is not nervous/anxious.     Health Maintenance  Topic Date Due   Diabetic kidney evaluation - Urine ACR  Never done   Hepatitis C Screening  Never done   Hepatitis B Vaccines (1 of 3 - 19+ 3-dose series) Never done   Cervical Cancer Screening (HPV/Pap Cotest)  Never done   Pneumococcal Vaccine 24-26 Years old (3 of 3 - PCV) 09/10/2018   COVID-19 Vaccine (1 - 2024-25 season) Never done   INFLUENZA VACCINE  07/13/2024   HEMOGLOBIN A1C  11/24/2024   Diabetic kidney evaluation - eGFR measurement  06/01/2025   OPHTHALMOLOGY EXAM  06/13/2025   FOOT EXAM  06/14/2025   DTaP/Tdap/Td (2 - Td or Tdap) 09/22/2025   Colonoscopy  07/29/2026   HIV Screening  Completed   HPV VACCINES  Aged Out   Meningococcal B Vaccine  Aged Out    Physical Exam: Vitals:   06/14/24 0917  BP: 132/78  Pulse: 99  Resp: 18  Temp: (!) 97.2 F (36.2 C)  SpO2: 98%  Weight: 170 lb (77.1 kg)  Height: 5' 0.98 (1.549 m)   Body mass index is 32.14 kg/m. Physical Exam Constitutional:      Appearance: She is obese.  HENT:     Head: Normocephalic and atraumatic.     Nose: Nose normal.     Mouth/Throat:     Mouth: Mucous membranes are moist.  Eyes:     Conjunctiva/sclera: Conjunctivae normal.  Cardiovascular:     Rate and Rhythm: Normal rate and regular rhythm.  Pulmonary:     Effort: Pulmonary effort is normal.     Breath sounds: Normal breath sounds.  Abdominal:     General: Bowel sounds are normal.     Palpations: Abdomen is soft.  Musculoskeletal:        General: Normal range of motion.     Cervical back: Normal range of motion.  Skin:    General: Skin is warm and dry.     Comments: Black scars on bilateral arms  Neurological:     General: No focal deficit present.     Mental Status: She is alert and oriented to person, place, and time.  Psychiatric:        Mood and Affect: Mood normal.         Behavior: Behavior normal.        Thought Content: Thought content normal.        Judgment: Judgment normal.     Labs reviewed: Basic Metabolic  Panel: Recent Labs    02/03/24 2253 02/04/24 0711 05/24/24 1038 05/25/24 0529 05/27/24 0815 05/27/24 1803 05/28/24 1229 05/29/24 0630 05/30/24 0514 06/01/24 1533  NA  --    < > 132*   < > 133*   < > 135 138 139 139  K  --    < > 3.2*   < > 2.8*   < > 3.0* 3.8 3.2* 4.0  CL  --    < > 107   < > 104   < > 104 108 110 108  CO2  --    < > 10*   < > 18*   < > 21* 22 19* 20*  GLUCOSE  --    < > 168*   < > 151*   < > 99 114* 122* 103*  BUN  --    < > 53*   < > 22*   < > 21* 16 11 14   CREATININE  --    < > 3.23*   < > 2.19*   < > 2.32* 2.17* 1.75* 1.75*  CALCIUM   --    < > 7.4*   < > 7.8*   < > 7.5* 7.3* 7.5* 8.3*  MG  --    < > 1.5*  --  1.7  --  2.1 1.8 1.5*  --   PHOS  --    < > 6.8*  --  3.1  --   --  3.5  --   --   TSH 1.231  --   --   --   --   --  1.603  --   --   --    < > = values in this interval not displayed.   Liver Function Tests: Recent Labs    05/29/24 0630 05/30/24 0514 06/01/24 1533  AST 18 19 28   ALT 16 14 17   ALKPHOS 62 64 118  BILITOT 0.3 0.3 0.4  PROT 5.1* 5.1* 6.9  ALBUMIN 1.8* 1.8* 3.1*   Recent Labs    07/31/23 1748 02/21/24 1628 06/01/24 1533  LIPASE 30 24 11    Recent Labs    05/28/24 1229  AMMONIA 44*   CBC: Recent Labs    05/24/24 0149 05/24/24 1038 05/28/24 1229 05/29/24 0630 05/30/24 0514 06/01/24 1533  WBC 4.1   < > 5.4 6.5 7.8 11.5*  NEUTROABS 2.5  --  3.3 3.2  --   --   HGB 11.1*   < > 9.5* 9.3* 9.2* 10.5*  HCT 32.9*   < > 27.7* 28.4* 28.2* 32.2*  MCV 85.2   < > 82.4 85.0 84.9 85.4  PLT 242   < > 304 334 359 434*   < > = values in this interval not displayed.   Lipid Panel: No results for input(s): CHOL, HDL, LDLCALC, TRIG, CHOLHDL, LDLDIRECT in the last 8760 hours. Lab Results  Component Value Date   HGBA1C 8.1 (H) 05/25/2024    Procedures since last  visit: CT ABDOMEN PELVIS W CONTRAST Result Date: 06/01/2024 CLINICAL DATA:  Right-sided abdominal pain and diarrhea EXAM: CT ABDOMEN AND PELVIS WITH CONTRAST TECHNIQUE: Multidetector CT imaging of the abdomen and pelvis was performed using the standard protocol following bolus administration of intravenous contrast. RADIATION DOSE REDUCTION: This exam was performed according to the departmental dose-optimization program which includes automated exposure control, adjustment of the mA and/or kV according to patient size and/or use of iterative reconstruction technique. CONTRAST:  80mL OMNIPAQUE  IOHEXOL  300 MG/ML  SOLN COMPARISON:  CT abdomen and pelvis dated 05/24/2024 FINDINGS: Lower chest: No focal consolidation or pulmonary nodule in the lung bases. No pleural effusion or pneumothorax demonstrated. Partially imaged heart size is normal. Hepatobiliary: No focal hepatic lesions. No intra or extrahepatic biliary ductal dilation. Cholecystectomy. Pancreas: No focal lesions or main ductal dilation. Spleen: Normal in size without focal abnormality. Adrenals/Urinary Tract: No adrenal nodules. No suspicious renal mass, calculi or hydronephrosis. Urinary bladder is underdistended. Stomach/Bowel: Postsurgical changes of Roux-en-Y gastric bypass. No evidence of bowel wall thickening, distention, or inflammatory changes. Normal appendix. Vascular/Lymphatic: No significant vascular findings are present. No enlarged abdominal or pelvic lymph nodes. Reproductive: No adnexal masses. Other: Trace pelvic free fluid.  No free air or fluid collection. Musculoskeletal: No acute or abnormal lytic or blastic osseous lesions. Mild body wall edema. IMPRESSION: 1. No acute abdominopelvic findings. 2. Postsurgical changes of Roux-en-Y gastric bypass. 3. Mild body wall edema. Electronically Signed   By: Limin  Xu M.D.   On: 06/01/2024 19:36   DG Abd Portable 1V Result Date: 05/27/2024 CLINICAL DATA:  [id number] Intractable nausea and  vomiting [id number] EXAM: PORTABLE ABDOMEN - 1 VIEW COMPARISON:  CT abdomen pelvis 05/24/2024 FINDINGS: The bowel gas pattern is normal. No radio-opaque calculi or other significant radiographic abnormality are seen. Right upper quadrant surgical clips. Question left upper quadrant bowel surgical clips. 4 cm density overlying the pelvis of unclear etiology. IMPRESSION: 1. Nonobstructive bowel gas pattern. 2. A 4 cm density overlying the pelvis of unclear etiology. Correlate with physical exam as this is likely external to the patient. Electronically Signed   By: Morgane  Naveau M.D.   On: 05/27/2024 15:02   US  RENAL Result Date: 05/26/2024 EXAM: RETROPERITONEAL ULTRASOUND OF THE KIDNEYS AND URINARY BLADDER TECHNIQUE: Real-time ultrasonography of the retroperitoneum including the kidneys and urinary bladder was performed. COMPARISON: CT abdomen / pelvis dated 05/24/2024 CLINICAL HISTORY: Costovertebral angle tenderness FINDINGS: RIGHT KIDNEY: The right kidney measures 9.7 x 4.2 x 4.7 cm (100 ml). The right kidney demonstrates normal cortical echogenicity. No hydronephrosis or intrarenal stones. LEFT KIDNEY: The left kidney measures 9.1 x 3.8 x 4.1 cm (75 ml). The left kidney demonstrates normal cortical echogenicity. No hydronephrosis or intrarenal stones. BLADDER: Unremarkable appearance of the bladder. IMPRESSION: 1. No acute findings. Electronically signed by: Pinkie Pebbles MD 05/26/2024 10:32 PM EDT RP Workstation: [id number]   CT ABDOMEN PELVIS WO CONTRAST Result Date: 05/24/2024 CLINICAL DATA:  Acute nonlocalized abdominal pain, nausea, vomiting, diarrhea. The pain is on the right side and there is fever EXAM: CT ABDOMEN AND PELVIS WITHOUT CONTRAST TECHNIQUE: Multidetector CT imaging of the abdomen and pelvis was performed following the standard protocol without IV contrast. RADIATION DOSE REDUCTION: This exam was performed according to the departmental dose-optimization program which includes automated  exposure control, adjustment of the mA and/or kV according to patient size and/or use of iterative reconstruction technique. COMPARISON:  02/06/2024 FINDINGS: Lower chest: No acute abnormality. Hepatobiliary: Unremarkable liver. Cholecystectomy. No biliary dilation. Pancreas: Unremarkable. Spleen: Unremarkable. Adrenals/Urinary Tract: Normal adrenal glands. No urinary calculi or hydronephrosis. Bladder is unremarkable. Stomach/Bowel: Postoperative change of Roux-en-Y gastric bypass. Patulous small bowel-small bowel anastomosis in the left abdomen. Fluid-filled distention the small bowel throughout the abdomen without definite transition point. Liquid stool in the colon compatible with diarrheal illness. No definite bowel wall thickening noting limitations of noncontrast exam. Normal appendix. Vascular/Lymphatic: No significant vascular findings are present. Prominent subcentimeter mesenteric and retroperitoneal lymph nodes are likely reactive. Reproductive: Unremarkable. Other: No free intraperitoneal  fluid or air. Musculoskeletal: No acute fracture. IMPRESSION: Fluid-filled small bowel throughout the abdomen without definite transition point. Liquid stool in the colon compatible with diarrheal illness. Findings are favored to enteritis. Electronically Signed   By: Norman Gatlin M.D.   On: 05/24/2024 03:09    Assessment/Plan  1. Urinary tract infection without hematuria, site unspecified (Primary) -  Suspected UTI with dysuria, cloudy urine, and severe abdominal and back pain. - Perform urine dipstick test. Showed blood and large, leukocytes positive, nitrate positive, protein positive - Prescribe oxycodone  5 mg daily PRN for pain. - Order urine culture if dipstick positive. - sulfamethoxazole -trimethoprim  (BACTRIM  DS) 800-160 MG tablet; Take 1 tablet by mouth 2 (two) times daily for 5 days.  Dispense: 10 tablet; Refill: 0  2. Type 2 diabetes mellitus with stage 3b chronic kidney disease, with  long-term current use of insulin  (HCC) Lab Results  Component Value Date   HGBA1C 8.1 (H) 05/25/2024    -  Suboptimal glycemic control with recent HbA1c of 8.1%. CKD stage 3B likely secondary to diabetes. - Adjust Lantus  to 13 units at bedtime. - Refill Lantus  at [place], [place]. - Refill continuous glucose monitoring system (Libre 3). - Encourage low carbohydrate diet and regular glucose monitoring. - Vitamin D , 25-hydroxy - Urine Albumin/Creatinine with ratio (send out) [LAB689] - insulin  glargine (LANTUS ) 100 UNIT/ML Solostar Pen; Inject 13 Units into the skin every evening.  Dispense: 15 mL; Refill: 0 - Continuous Glucose Sensor (FREESTYLE LIBRE 3 SENSOR) MISC; Place 1 sensor on the skin every 14 days. Use to check glucose continuously  Dispense: 2 each; Refill: 0  3. Major depression, recurrent, chronic (HCC) -  PHQ-9 score 11, ranging as moderate depression -  Moderate depression managed with Prozac . Dosage discrepancy noted. - Verify Prozac  dosage with psychiatrist Dr. Vincente. - Ensure correct Prozac  dosage.  4. Gastroesophageal reflux disease without esophagitis -  stable -  continue Pantoprazole  40 mg daily  5. Hypomagnesemia -  05/30/24 Magnesium  1.5 -  will re-check Mg - Magnesium  - Vitamin D , 25-hydroxy  6. Screening for colorectal cancer - Ambulatory referral to Gastroenterology  7. GAD (generalized anxiety disorder) -  stable -  continue Xanax  3X daily PRN  8. Class 1 obesity with body mass index (BMI) of 32.0 to 32.9 in adult, unspecified obesity type, unspecified whether serious comorbidity present -  counseled on diet  and exercise  9. Screen for STD (sexually transmitted disease) - Hep C Antibody  10. Screening mammogram for breast cancer - MM Digital Diagnostic Bilat; Future  11. Encounter to establish care - established care with [facility]  12. Dysuria - Urine Culture - POCT Urinalysis Dip Manual -showed positive nitrate, positive leukocytes, large  blood, positive protein - Started on Bactrim  DS 1 tablet orally twice a day x 5 days, sent eRx to pharmacy -  Oxycodone  5 mg 1 tab daily PRN X 5 days   Labs/tests ordered: Hepatitis C antibody, magnesium , mammogram, urine dipstick, urine culture, urine microalbumin creatinine ratio, vitamin D  level   Return in about 1 week (around 06/21/2024).  Terris Bodin Medina-Vargas, NP

## 2024-06-14 NOTE — Progress Notes (Signed)
-    sent eRX for Bactrim  DS for possible UTI per dipstick result, urine sent for culture

## 2024-06-16 MED ORDER — VITAMIN D (ERGOCALCIFEROL) 1.25 MG (50000 UNIT) PO CAPS: 50000.0000 [IU] | ORAL_CAPSULE | ORAL | 3 refills | Status: DC

## 2024-06-16 MED ORDER — OYSTER SHELL CALCIUM/D3 500-5 MG-MCG PO TABS: 1.0000 | ORAL_TABLET | Freq: Two times a day (BID) | ORAL | Status: AC

## 2024-06-16 NOTE — Progress Notes (Signed)
-   Vitamin D  level low, will need to start on vitamin D  50,000 units weekly, will send eRx to pharmacy -   Hep C antibody negative -   Magnesium  normal level - Hemoglobin 10.5, improved from 9.2 (05/30/2024) -   Calcium  8.3, start calcium  500 mg twice a day for supplementation -   GFR 36, ranging as chronic kidney disease stage IIIb, avoid UC NSAIDs such as ibuprofen

## 2024-06-16 NOTE — Addendum Note (Signed)
 Addended by: PHYLLIS MUTTER C on: 06/16/2024 03:54 PM   Modules accepted: Level of Service

## 2024-06-17 LAB — URINE CULTURE
MICRO NUMBER:: 16658843
SPECIMEN QUALITY:: ADEQUATE

## 2024-06-17 LAB — MAGNESIUM: Magnesium: 1.7 mg/dL (ref 1.5–2.5)

## 2024-06-17 LAB — MICROALBUMIN / CREATININE URINE RATIO
Creatinine, Urine: 94 mg/dL (ref 20–275)
Microalb Creat Ratio: 1621 mg/g{creat} — ABNORMAL HIGH (ref <30)
Microalb, Ur: 152.4 mg/dL

## 2024-06-17 LAB — HEPATITIS C ANTIBODY: Hepatitis C Ab: NONREACTIVE

## 2024-06-17 LAB — VITAMIN D 25 HYDROXY (VIT D DEFICIENCY, FRACTURES): Vit D, 25-Hydroxy: 17 ng/mL — ABNORMAL LOW (ref 30–100)

## 2024-06-20 ENCOUNTER — Emergency Department (HOSPITAL_BASED_OUTPATIENT_CLINIC_OR_DEPARTMENT_OTHER)

## 2024-06-20 ENCOUNTER — Emergency Department (HOSPITAL_BASED_OUTPATIENT_CLINIC_OR_DEPARTMENT_OTHER)
Admission: EM | Admit: 2024-06-20 | Discharge: 2024-06-20 | Disposition: A | Discharge status: Home / Self Care | Attending: Emergency Medicine | Admitting: Emergency Medicine

## 2024-06-20 ENCOUNTER — Encounter (HOSPITAL_BASED_OUTPATIENT_CLINIC_OR_DEPARTMENT_OTHER): Payer: Self-pay

## 2024-06-20 ENCOUNTER — Other Ambulatory Visit: Payer: Self-pay

## 2024-06-20 DIAGNOSIS — N83202 Unspecified ovarian cyst, left side: Secondary | ICD-10-CM | POA: Diagnosis not present

## 2024-06-20 DIAGNOSIS — Z794 Long term (current) use of insulin: Secondary | ICD-10-CM | POA: Insufficient documentation

## 2024-06-20 DIAGNOSIS — Z8673 Personal history of transient ischemic attack (TIA), and cerebral infarction without residual deficits: Secondary | ICD-10-CM | POA: Insufficient documentation

## 2024-06-20 DIAGNOSIS — E1165 Type 2 diabetes mellitus with hyperglycemia: Secondary | ICD-10-CM | POA: Diagnosis not present

## 2024-06-20 DIAGNOSIS — F1510 Other stimulant abuse, uncomplicated: Secondary | ICD-10-CM | POA: Insufficient documentation

## 2024-06-20 DIAGNOSIS — R0682 Tachypnea, not elsewhere classified: Secondary | ICD-10-CM | POA: Insufficient documentation

## 2024-06-20 DIAGNOSIS — F1310 Sedative, hypnotic or anxiolytic abuse, uncomplicated: Secondary | ICD-10-CM | POA: Diagnosis not present

## 2024-06-20 DIAGNOSIS — I10 Essential (primary) hypertension: Secondary | ICD-10-CM | POA: Insufficient documentation

## 2024-06-20 DIAGNOSIS — N838 Other noninflammatory disorders of ovary, fallopian tube and broad ligament: Secondary | ICD-10-CM

## 2024-06-20 DIAGNOSIS — K2900 Acute gastritis without bleeding: Secondary | ICD-10-CM | POA: Diagnosis not present

## 2024-06-20 DIAGNOSIS — R9389 Abnormal findings on diagnostic imaging of other specified body structures: Secondary | ICD-10-CM

## 2024-06-20 DIAGNOSIS — Z79899 Other long term (current) drug therapy: Secondary | ICD-10-CM | POA: Diagnosis not present

## 2024-06-20 DIAGNOSIS — E875 Hyperkalemia: Secondary | ICD-10-CM | POA: Insufficient documentation

## 2024-06-20 DIAGNOSIS — R824 Acetonuria: Secondary | ICD-10-CM | POA: Diagnosis not present

## 2024-06-20 DIAGNOSIS — R1013 Epigastric pain: Secondary | ICD-10-CM

## 2024-06-20 LAB — CBC WITH DIFFERENTIAL/PLATELET
Abs Immature Granulocytes: 0.02 K/uL (ref 0.00–0.07)
Basophils Absolute: 0 K/uL (ref 0.0–0.1)
Basophils Relative: 0 %
Eosinophils Absolute: 0 K/uL (ref 0.0–0.5)
Eosinophils Relative: 0 %
HCT: 36.6 % (ref 36.0–46.0)
Hemoglobin: 11.8 g/dL — ABNORMAL LOW (ref 12.0–15.0)
Immature Granulocytes: 0 %
Lymphocytes Relative: 26 %
Lymphs Abs: 2.2 K/uL (ref 0.7–4.0)
MCH: 28.9 pg (ref 26.0–34.0)
MCHC: 32.2 g/dL (ref 30.0–36.0)
MCV: 89.7 fL (ref 80.0–100.0)
Monocytes Absolute: 0.5 K/uL (ref 0.1–1.0)
Monocytes Relative: 6 %
Neutro Abs: 5.6 K/uL (ref 1.7–7.7)
Neutrophils Relative %: 68 %
Platelets: 328 K/uL (ref 150–400)
RBC: 4.08 MIL/uL (ref 3.87–5.11)
RDW: 15.5 % (ref 11.5–15.5)
WBC: 8.3 K/uL (ref 4.0–10.5)
nRBC: 0 % (ref 0.0–0.2)

## 2024-06-20 LAB — URINALYSIS, W/ REFLEX TO CULTURE (INFECTION SUSPECTED)
Bacteria, UA: NONE SEEN
Bilirubin Urine: NEGATIVE
Glucose, UA: NEGATIVE mg/dL
Ketones, ur: 15 mg/dL — AB
Leukocytes,Ua: NEGATIVE
Nitrite: NEGATIVE
Protein, ur: >300 mg/dL — AB
Specific Gravity, Urine: 1.019 (ref 1.005–1.030)
pH: 6.5 (ref 5.0–8.0)

## 2024-06-20 LAB — COMPREHENSIVE METABOLIC PANEL WITH GFR
ALT: 14 U/L (ref 0–44)
AST: 18 U/L (ref 15–41)
Albumin: 4.2 g/dL (ref 3.5–5.0)
Alkaline Phosphatase: 124 U/L (ref 38–126)
Anion gap: 16 — ABNORMAL HIGH (ref 5–15)
BUN: 17 mg/dL (ref 6–20)
CO2: 15 mmol/L — ABNORMAL LOW (ref 22–32)
Calcium: 10 mg/dL (ref 8.9–10.3)
Chloride: 103 mmol/L (ref 98–111)
Creatinine, Ser: 1.75 mg/dL — ABNORMAL HIGH (ref 0.44–1.00)
GFR, Estimated: 36 mL/min — ABNORMAL LOW (ref >60)
Glucose, Bld: 145 mg/dL — ABNORMAL HIGH (ref 70–99)
Potassium: 5.6 mmol/L — ABNORMAL HIGH (ref 3.5–5.1)
Sodium: 134 mmol/L — ABNORMAL LOW (ref 135–145)
Total Bilirubin: 0.8 mg/dL (ref 0.0–1.2)
Total Protein: 8.6 g/dL — ABNORMAL HIGH (ref 6.5–8.1)

## 2024-06-20 LAB — CBG MONITORING, ED
Glucose-Capillary: 63 mg/dL — ABNORMAL LOW (ref 70–99)
Glucose-Capillary: 126 mg/dL — ABNORMAL HIGH (ref 70–99)

## 2024-06-20 LAB — BASIC METABOLIC PANEL WITH GFR
Anion gap: 14 (ref 5–15)
BUN: 18 mg/dL (ref 6–20)
CO2: 15 mmol/L — ABNORMAL LOW (ref 22–32)
Calcium: 9.3 mg/dL (ref 8.9–10.3)
Chloride: 105 mmol/L (ref 98–111)
Creatinine, Ser: 1.75 mg/dL — ABNORMAL HIGH (ref 0.44–1.00)
GFR, Estimated: 36 mL/min — ABNORMAL LOW (ref >60)
Glucose, Bld: 126 mg/dL — ABNORMAL HIGH (ref 70–99)
Potassium: 6.1 mmol/L — ABNORMAL HIGH (ref 3.5–5.1)
Sodium: 134 mmol/L — ABNORMAL LOW (ref 135–145)

## 2024-06-20 LAB — POTASSIUM: Potassium: 5.5 mmol/L — ABNORMAL HIGH (ref 3.5–5.1)

## 2024-06-20 LAB — URINE DRUG SCREEN
Amphetamines: DETECTED — AB
Barbiturates: NOT DETECTED
Benzodiazepines: DETECTED — AB
Cocaine: NOT DETECTED
Fentanyl: NOT DETECTED
Methadone Scn, Ur: NOT DETECTED
Opiates: NOT DETECTED
Tetrahydrocannabinol: NOT DETECTED

## 2024-06-20 LAB — TROPONIN T, HIGH SENSITIVITY
Troponin T High Sensitivity: <15 ng/L (ref <19)
Troponin T High Sensitivity: <15 ng/L (ref <19)

## 2024-06-20 LAB — LACTIC ACID, PLASMA: Lactic Acid, Venous: 0.9 mmol/L (ref 0.5–1.9)

## 2024-06-20 LAB — MISCELLANEOUS TEST

## 2024-06-20 LAB — LIPASE, BLOOD: Lipase: 13 U/L (ref 11–51)

## 2024-06-20 MED ORDER — ACETAMINOPHEN 160 MG/5ML PO SOLN
650.0000 mg | Freq: Once | ORAL | Status: AC
  Administered 2024-06-20: 650 mg via ORAL
  Filled 2024-06-20: qty 20.3

## 2024-06-20 MED ORDER — IOHEXOL 350 MG/ML SOLN
100.0000 mL | Freq: Once | INTRAVENOUS | Status: AC | PRN
  Administered 2024-06-20: 85 mL via INTRAVENOUS

## 2024-06-20 MED ORDER — LACTATED RINGERS IV BOLUS
1000.0000 mL | Freq: Once | INTRAVENOUS | Status: AC
  Administered 2024-06-20: 1000 mL via INTRAVENOUS

## 2024-06-20 MED ORDER — ALUM & MAG HYDROXIDE-SIMETH 200-200-20 MG/5ML PO SUSP
30.0000 mL | Freq: Once | ORAL | Status: AC
  Administered 2024-06-20: 30 mL via ORAL
  Filled 2024-06-20: qty 30

## 2024-06-20 MED ORDER — LABETALOL HCL 5 MG/ML IV SOLN
20.0000 mg | Freq: Once | INTRAVENOUS | Status: AC
  Administered 2024-06-20: 20 mg via INTRAVENOUS
  Filled 2024-06-20: qty 4

## 2024-06-20 MED ORDER — SODIUM ZIRCONIUM CYCLOSILICATE 10 G PO PACK
10.0000 g | PACK | Freq: Once | ORAL | Status: AC
  Administered 2024-06-20: 10 g via ORAL
  Filled 2024-06-20: qty 1

## 2024-06-20 MED ORDER — LIDOCAINE VISCOUS HCL 2 % MT SOLN
15.0000 mL | Freq: Once | OROMUCOSAL | Status: AC
  Administered 2024-06-20: 15 mL via ORAL
  Filled 2024-06-20: qty 15

## 2024-06-20 MED ORDER — SODIUM CHLORIDE 0.9 % IV BOLUS
1000.0000 mL | Freq: Once | INTRAVENOUS | Status: AC
  Administered 2024-06-20: 1000 mL via INTRAVENOUS

## 2024-06-20 MED ORDER — FENTANYL CITRATE PF 50 MCG/ML IJ SOSY
50.0000 ug | PREFILLED_SYRINGE | Freq: Once | INTRAMUSCULAR | Status: AC
  Administered 2024-06-20: 50 ug via INTRAVENOUS
  Filled 2024-06-20: qty 1

## 2024-06-20 MED ORDER — DEXTROSE 50 % IV SOLN
1.0000 | Freq: Once | INTRAVENOUS | Status: AC
  Administered 2024-06-20: 50 mL via INTRAVENOUS
  Filled 2024-06-20: qty 50

## 2024-06-20 MED ORDER — PROCHLORPERAZINE EDISYLATE 10 MG/2ML IJ SOLN
10.0000 mg | Freq: Once | INTRAMUSCULAR | Status: AC
  Administered 2024-06-20: 10 mg via INTRAVENOUS
  Filled 2024-06-20: qty 2

## 2024-06-20 MED ORDER — FAMOTIDINE 20 MG PO TABS: 20.0000 mg | ORAL_TABLET | Freq: Every day | ORAL | 0 refills | Status: DC

## 2024-06-20 MED ORDER — ONDANSETRON 4 MG PO TBDP: 4.0000 mg | ORAL_TABLET | Freq: Four times a day (QID) | ORAL | 0 refills | Status: AC | PRN

## 2024-06-20 MED ORDER — FAMOTIDINE IN NACL 20-0.9 MG/50ML-% IV SOLN
20.0000 mg | Freq: Once | INTRAVENOUS | Status: AC
  Administered 2024-06-20: 20 mg via INTRAVENOUS
  Filled 2024-06-20: qty 50

## 2024-06-20 MED ORDER — ONDANSETRON HCL 4 MG/2ML IJ SOLN
4.0000 mg | Freq: Once | INTRAMUSCULAR | Status: AC
  Administered 2024-06-20: 4 mg via INTRAVENOUS
  Filled 2024-06-20: qty 2

## 2024-06-20 MED ORDER — INSULIN ASPART 100 UNIT/ML IV SOLN
10.0000 [IU] | Freq: Once | INTRAVENOUS | Status: AC
  Administered 2024-06-20: 10 [IU] via INTRAVENOUS

## 2024-06-20 NOTE — ED Provider Notes (Signed)
 3:05 PM Patient signed out to me by previous ED physician. Pt is a 48 yo female presenting for epigastric pain, nausea, vomiting.  Ct dissection stable. Concerns for gastritis. Ecg stable Hypertension improving  Repeat lytes and po challenge.   Physical Exam  BP (!) 146/87   Pulse 75   Temp 97.7 F (36.5 C) (Oral)   Resp 20   Ht 5' 1 (1.549 m)   Wt 77.1 kg   LMP 04/18/2024   SpO2 100%   BMI 32.12 kg/m   Physical Exam  Procedures  Procedures  ED Course / MDM   Clinical Course as of 06/20/24 2024  Wed Jun 20, 2024  1511 Patients mother is now with the patient. [EL]    Clinical Course User Index [EL] Charmayne Holmes, DO   Medical Decision Making Amount and/or Complexity of Data Reviewed Labs: ordered. Radiology: ordered.  Risk OTC drugs. Prescription drug management.   Patient states she is feeling much improved at this time.  Nausea and vomiting has stopped.  P.o. challenge successful.  She did have hyperkalemia with a potassium level of 5.6.  She had a repeat BMP due to an anion gap of 16 that improved down to 14 after IV fluids.  Likely secondary to dehydration from nausea and vomiting.  Her repeat BMP did show a potassium level of 6.1.  Lokelma  and insulin  with dextrose  given.  Patient otherwise stable at this time and safe for discharge home.  There is no EKG changes.  She has no chest pain or secondary symptoms.  She is recommended for follow-up with her primary care physician in the next 3 to 5 days for repeat potassium levels.  She is also recommended to return to emergency department if nausea and vomiting return.  Her primary diagnosis is gastritis and hypertension.  Blood pressure improved to 151/87 at this time.       Elnor Bernarda SQUIBB, DO 06/20/24 2026

## 2024-06-20 NOTE — Discharge Instructions (Addendum)
 Please follow-up with your primary care physician for further management of gastritis as well as repeat electrolyte check in the next 3 to 5 days.  Your potassium was elevated today and you were given medications to help bring it down.  Please return to emergency department as needed if nausea and vomiting returns.

## 2024-06-20 NOTE — ED Notes (Signed)
 Patient transported to [facility]  via stretcher. Nausea continues but no acute distress at this time.

## 2024-06-20 NOTE — ED Notes (Signed)
 [facility] 126, pt and family member inquiring about discharge medications. EMT-P Francis and EDP Elnor notified and aware.

## 2024-06-20 NOTE — ED Triage Notes (Signed)
 Patient reports nausea and vomiting fro the past day. Reports burning sensation in abdomen.

## 2024-06-20 NOTE — ED Provider Notes (Addendum)
 [place] EMERGENCY DEPARTMENT AT [facility] Provider Note   CSN: [id number] Arrival date & time: 06/20/24  9047     Patient presents with: Nausea   Virginia [name] is a 48 y.o. female.   Patient presents to the ED with abdominal pain. Past surgical history is cholecystectomy and gastric bypass.The pain started yesterday morning and it has gotten progressively worse. The patient is tearful and says she can't take it anymore. She has been nauseous and has had vomiting since yesterday. She denies blood in the vomit. She says the pain is not a tearing feeling or extend to her back. She describes it as burning. She denies chest and back pain although she states she is short of breath. She says the pain is all over her abdomen but is worse at the top and on the right side.  She has been unable to eat since Monday. Her last bowel movement and flatulence and her last meal was Monday. She is currently taking an antibiotic for salmonella infection and a urinary tract infection. She denies sick contacts, fevers or chills. When asked if she had any changes in her vision, she said its hard to tell because she just had eye surgery. She did try to take Zofran  but has been throwing everything up. She denies recent trauma.  She denies marijuana use and she does drink alcohol. She is allergic to bananas, morphine , ACE inhibitors, and Lamictal.   The history is provided by the patient.       Prior to Admission medications   Medication Sig Start Date End Date Taking? Authorizing Provider  acetaminophen  (TYLENOL ) 325 MG tablet Take 2 tablets (650 mg total) by mouth every 6 (six) hours as needed for mild pain (pain score 1-3) (or Fever >/= 101). 02/07/24   Sheikh, Omair Latif, DO  albuterol  (VENTOLIN  HFA) 108 (90 Base) MCG/ACT inhaler Inhale 2 puffs every 4 (four) hours as needed into the lungs for wheezing or shortness of breath.    [provider]  ALPRAZolam  (XANAX ) 1 MG tablet  Take 1 mg by mouth 3 (three) times daily as needed for anxiety. 02/26/20   [provider]  Blood Glucose Monitoring Suppl DEVI 1 each by Does not apply route 3 (three) times daily. May dispense any manufacturer covered by patient's insurance. 02/07/24   Sherrill Cable Latif, DO  calcium -vitamin D  (OSCAL WITH D) 500-5 MG-MCG tablet Take 1 tablet by mouth 2 (two) times daily. 06/16/24   Medina-Vargas, Monina C, NP  Continuous Glucose Sensor (FREESTYLE LIBRE 3 SENSOR) MISC Place 1 sensor on the skin every 14 days. Use to check glucose continuously 06/14/24   Medina-Vargas, Monina C, NP  FLUoxetine  (PROZAC ) 40 MG capsule Take 40 mg by mouth daily. 12/21/19   [provider]  Glucose Blood (BLOOD GLUCOSE TEST STRIPS) STRP 1 each by Does not apply route 3 (three) times daily. Use as directed to check blood sugar. May dispense any manufacturer covered by patient's insurance and fits patient's device. 02/07/24   Sherrill Cable Latif, DO  insulin  glargine (LANTUS ) 100 UNIT/ML Solostar Pen Inject 13 Units into the skin every evening. 06/14/24   Medina-Vargas, Monina C, NP  Insulin  Pen Needle (PEN NEEDLES) 31G X 5 MM MISC 1 each by Does not apply route 3 (three) times daily. May dispense any manufacturer covered by patient's insurance. 02/07/24   Sherrill Cable Donovan, DO  Lancet Device MISC 1 each by Does not apply route 3 (three) times daily. May dispense any  manufacturer covered by [employer]. 02/07/24   Sherrill Cable Latif, DO  Lancets MISC 1 each by Does not apply route 3 (three) times daily. Use as directed to check blood sugar. May dispense any manufacturer covered by patient's insurance and fits patient's device. 02/07/24   Sheikh, Omair Latif, DO  Multiple Vitamin (MULTIVITAMIN WITH MINERALS) TABS tablet Take 1 tablet by mouth daily. 02/08/24   Sherrill Cable Latif, DO  ondansetron  (ZOFRAN -ODT) 4 MG disintegrating tablet Take 1 tablet (4 mg total) by mouth every 8 (eight) hours as needed for nausea.  06/01/24   Theophilus Pagan, MD  pantoprazole  (PROTONIX ) 40 MG tablet Take 1 tablet (40 mg total) by mouth daily. 05/30/24 08/28/24  [place], Camellia PARAS, DO  Vitamin D , Ergocalciferol , (DRISDOL ) 1.25 MG (50000 UNIT) CAPS capsule Take 1 capsule (50,000 Units total) by mouth every 7 (seven) days. 06/16/24   Medina-Vargas, Monina C, NP    Allergies: Ace inhibitors, Banana, Lamictal [lamotrigine], Morphine , Nsaids, and Topiramate    Review of Systems  Updated Vital Signs BP (!) 221/121   Pulse 89   Temp 97.7 F (36.5 C) (Oral)   Resp (!) 22   Ht 5' 1 (1.549 m)   Wt 77.1 kg   LMP 04/18/2024   SpO2 100%   BMI 32.12 kg/m   Physical Exam Constitutional:      Appearance: She is ill-appearing.  HENT:     Head: Atraumatic.     Nose: No congestion or rhinorrhea.  Cardiovascular:     Heart sounds: Normal heart sounds.  Pulmonary:     Effort: Tachypnea present.  Abdominal:     Tenderness: There is abdominal tenderness.     (all labs ordered are listed, but only abnormal results are displayed) Labs Reviewed  CBC WITH DIFFERENTIAL/PLATELET - Abnormal; Notable for the following components:      Result Value   Hemoglobin 11.8 (*)    All other components within normal limits  COMPREHENSIVE METABOLIC PANEL WITH GFR  LIPASE, BLOOD  LACTIC ACID, PLASMA  LACTIC ACID, PLASMA  URINALYSIS, W/ REFLEX TO CULTURE (INFECTION SUSPECTED)  URINE DRUG SCREEN  TROPONIN T, HIGH SENSITIVITY    EKG: None  Radiology: No results found.   Procedures   Medications Ordered in the ED  famotidine  (PEPCID ) IVPB 20 mg premix (has no administration in time range)  fentaNYL  (SUBLIMAZE ) injection 50 mcg (50 mcg Intravenous Given 06/20/24 1048)  ondansetron  (ZOFRAN ) injection 4 mg (4 mg Intravenous Given 06/20/24 1048)    Clinical Course as of 06/20/24 1512  Wed Jun 20, 2024  1511 Patients mother is now with the patient. [EL]    Clinical Course User Index [EL] Charmayne Holmes, DO                                  Medical Decision Making Amount and/or Complexity of Data Reviewed Labs: ordered. Radiology: ordered.  Risk OTC drugs. Prescription drug management.   This patient presents to the ED for concern of abdominal pain with vomiting, this involves an extensive number of treatment options, and is a complaint that carries with it a high risk of complications and morbidity.  The differential diagnosis includes bowel obstruction, mesenteric ischemia, pancreatitis, abdominal aortic aneurysm, aortic dissection, myocardial infarction, gastric ulcer perforation, GERD, bowel perforation, peritoneal abscess, hernia   Co morbidities / Chronic conditions that complicate the patient evaluation  HTN, diabetes, gastric bypass, cholecystectomy, CVA, recent salmonella enteritis treated  with antibiotics   Lab Tests:  I Ordered, and personally interpreted labs.  The pertinent results include:   UDS: positive benzos and amphetamines  Troponin: <15 UA: >300 protein, small Hgb, 15 ketones, negative for nitrites and leukocytes and bacteria  CBC: hbg 11.8, otherwise wnl CMP: 134 Na, 5.6 K, 145 glucose, 1.75 Cr, 8.6 protein, 36 GFR, 16 Anion gap Lipase: 13 Lactic acid: .9 wnl Pending BMP   Imaging Studies ordered:  I ordered imaging studies including CTA Chest/Abd/pelvis, pelvic US  I independently visualized and interpreted imaging which showed no inflammatory process in the chest, pelvis, or abdomen. Negative for aortic process. Hypoattenuation in left adnexa.  US : pending results  I agree with the radiologist interpretation   Cardiac Monitoring: / EKG:  The patient was maintained on a cardiac monitor.  I personally viewed and interpreted the cardiac monitored which showed an underlying rhythm of: sinus rhythm   Problem List / ED Course / Critical interventions / Medication management  She described the abdominal pain as burning, likely gastritis.  I ordered medication including fentanyl  for  pain, and ondansetron  for nausea, famotidine  for burning sensation, labetalol  for her hypertension Patient was feeling more comfortable with the first dose of fentanyl , pain had returned and more fentanyl  was given.  CT abdomen and lab work make pancreatitis, aortic pathology, bowel obstruction, peritoneal abscess, or mesenteric ischemia less likely. White count, lactic acid, and lipase normal.  EKG NSR and troponin normal making MI less likely.  Ordering another metabolic panel with a PO challenge to see change after fluids. Ordered maalox and lidocaine  for the gastritis. Hypertension has improved since arrival.  Transitioning care to Dr. Elnor.  I have reviewed the patients home medicines and have made adjustments as needed   Test / Admission - Considered:  Transitioning management to Dr. Elnor.       Final diagnoses:  None    ED Discharge Orders     None          Charmayne Holmes, DO 06/20/24 1512    Charmayne Holmes, DO 06/20/24 1516    Jerrol Agent, MD 06/20/24 1729

## 2024-06-20 NOTE — ED Notes (Signed)
 Pt given cranberry juice and peanut butter crackers to increase CBG.

## 2024-06-25 ENCOUNTER — Other Ambulatory Visit: Payer: Self-pay | Admitting: Adult Health

## 2024-06-25 ENCOUNTER — Telehealth: Payer: Self-pay

## 2024-06-25 DIAGNOSIS — N390 Urinary tract infection, site not specified: Secondary | ICD-10-CM

## 2024-06-25 MED ORDER — SULFAMETHOXAZOLE-TRIMETHOPRIM 800-160 MG PO TABS: 1.0000 | ORAL_TABLET | Freq: Two times a day (BID) | ORAL | 0 refills | Status: AC

## 2024-06-25 NOTE — Telephone Encounter (Signed)
 Noted. Thanks.

## 2024-06-25 NOTE — Telephone Encounter (Signed)
 Incoming fax received stating patient needs a prior authorization for [employer] 3 sensor. Outing going call placed to number provided on fax [phone], patient ID [id number]  After speaking with representative I was told to call [phone] for the prior authorization department   Outgoing call placed to the PA department, after a 12 minute hold, call ended and I will try again tomorrow

## 2024-06-25 NOTE — Telephone Encounter (Signed)
Called patient and no answer. Voicemail was left with office call back number.   

## 2024-06-25 NOTE — Progress Notes (Signed)
-    urine culture positive for UTI, sent eRx to pharmacy for Bactrim  Ds 1 tab twice a day X 7 days.

## 2024-06-26 ENCOUNTER — Encounter: Payer: Self-pay | Admitting: Adult Health

## 2024-06-26 NOTE — Telephone Encounter (Signed)
 Received fax from [facility] stating that Prior Auth#: [id number] was APPROVED for the [facility] 3 sensor for 06/26/2024-12/23/2024

## 2024-06-26 NOTE — Telephone Encounter (Signed)
 Outgoing call placed to [phone] to resume initiating PA, after a 11 minute hold and being transferred to the correct department, a representative [place]) answered the call and we were immediately disconnected.  I called again, this time spoke with [facility], who also transferred me to another department (dedicated team, [phone]). After 17 minutes of holding I was connected with a representative that stated she would create a case for Covermymeds Key: [id number]  PA now completed via Covermymeds       I called patients pharmacy and left a detailed voicemail notifying them of prior authorization approval.

## 2024-06-27 ENCOUNTER — Other Ambulatory Visit: Payer: Self-pay | Admitting: Medical Genetics

## 2024-06-27 NOTE — Progress Notes (Signed)
 If you are no longer having UTI symptoms then you do not have to take another round of Bactrim .

## 2024-07-03 ENCOUNTER — Other Ambulatory Visit

## 2024-07-09 ENCOUNTER — Encounter: Admitting: Adult Health

## 2024-07-09 DIAGNOSIS — Z794 Long term (current) use of insulin: Secondary | ICD-10-CM

## 2024-07-09 DIAGNOSIS — K219 Gastro-esophageal reflux disease without esophagitis: Secondary | ICD-10-CM

## 2024-07-09 DIAGNOSIS — J4521 Mild intermittent asthma with (acute) exacerbation: Secondary | ICD-10-CM

## 2024-07-09 DIAGNOSIS — E875 Hyperkalemia: Secondary | ICD-10-CM

## 2024-07-09 DIAGNOSIS — F419 Anxiety disorder, unspecified: Secondary | ICD-10-CM

## 2024-07-09 DIAGNOSIS — E559 Vitamin D deficiency, unspecified: Secondary | ICD-10-CM

## 2024-07-09 NOTE — Progress Notes (Signed)
 This encounter was created in error - please disregard.

## 2024-07-13 LAB — HM DIABETES EYE EXAM

## 2024-08-09 ENCOUNTER — Other Ambulatory Visit: Payer: Self-pay | Admitting: Adult Health

## 2024-08-09 DIAGNOSIS — N1832 Chronic kidney disease, stage 3b: Secondary | ICD-10-CM

## 2024-08-21 ENCOUNTER — Encounter: Payer: Self-pay | Admitting: Adult Health

## 2024-08-21 NOTE — Telephone Encounter (Signed)
 MyChart message sent to patient with provider reply.

## 2024-08-21 NOTE — Telephone Encounter (Signed)
 The attached data was did not show. We can discuss that in your upcoming visit or you can re-attach data if possible. Thanks.

## 2024-08-21 NOTE — Telephone Encounter (Signed)
 Message routed to PCP Medina-Vargas, Monina C, NP

## 2024-08-21 NOTE — Telephone Encounter (Signed)
 It was asking for a member log in. I was not able to access data.

## 2024-08-27 ENCOUNTER — Ambulatory Visit: Admitting: Adult Health

## 2024-09-14 NOTE — Telephone Encounter (Signed)
 Pls follow up in clinic for diabetes management.

## 2024-09-26 ENCOUNTER — Other Ambulatory Visit: Payer: Self-pay | Admitting: Adult Health

## 2024-09-26 DIAGNOSIS — Z794 Long term (current) use of insulin: Secondary | ICD-10-CM

## 2024-09-26 MED ORDER — FREESTYLE LIBRE 3 SENSOR MISC
0 refills | Status: DC
Start: 2024-09-26 — End: 2024-09-26

## 2024-09-26 MED ORDER — FREESTYLE LIBRE 3 SENSOR MISC
6 refills | Status: AC
Start: 2024-09-26 — End: ?

## 2024-09-26 NOTE — Telephone Encounter (Signed)
 Sent e-Rx to pharmacy

## 2024-09-26 NOTE — Progress Notes (Signed)
 Libre 3 eRx sent to pharmacy.

## 2024-09-28 ENCOUNTER — Encounter: Payer: Self-pay | Admitting: Adult Health

## 2024-09-28 ENCOUNTER — Ambulatory Visit: Admitting: Adult Health

## 2024-09-28 VITALS — Temp 96.9°F | Wt 178.8 lb

## 2024-09-28 DIAGNOSIS — K2970 Gastritis, unspecified, without bleeding: Secondary | ICD-10-CM | POA: Diagnosis not present

## 2024-09-28 DIAGNOSIS — F900 Attention-deficit hyperactivity disorder, predominantly inattentive type: Secondary | ICD-10-CM

## 2024-09-28 DIAGNOSIS — Z1231 Encounter for screening mammogram for malignant neoplasm of breast: Secondary | ICD-10-CM

## 2024-09-28 DIAGNOSIS — E875 Hyperkalemia: Secondary | ICD-10-CM | POA: Diagnosis not present

## 2024-09-28 DIAGNOSIS — Z794 Long term (current) use of insulin: Secondary | ICD-10-CM

## 2024-09-28 DIAGNOSIS — E1122 Type 2 diabetes mellitus with diabetic chronic kidney disease: Secondary | ICD-10-CM

## 2024-09-28 DIAGNOSIS — F419 Anxiety disorder, unspecified: Secondary | ICD-10-CM

## 2024-09-28 DIAGNOSIS — N1832 Chronic kidney disease, stage 3b: Secondary | ICD-10-CM

## 2024-09-28 DIAGNOSIS — J302 Other seasonal allergic rhinitis: Secondary | ICD-10-CM

## 2024-09-28 DIAGNOSIS — Z23 Encounter for immunization: Secondary | ICD-10-CM | POA: Diagnosis not present

## 2024-09-28 DIAGNOSIS — Z124 Encounter for screening for malignant neoplasm of cervix: Secondary | ICD-10-CM

## 2024-09-28 MED ORDER — FAMOTIDINE 20 MG PO TABS: 20.0000 mg | ORAL_TABLET | Freq: Every day | ORAL | 1 refills | Status: AC

## 2024-09-28 MED ORDER — INSULIN GLARGINE 100 UNIT/ML SOLOSTAR PEN: 8.0000 [IU] | PEN_INJECTOR | Freq: Every evening | SUBCUTANEOUS | 1 refills | Status: DC

## 2024-09-28 MED ORDER — LORATADINE 10 MG PO TABS: 10.0000 mg | ORAL_TABLET | Freq: Every day | ORAL | Status: AC | PRN

## 2024-09-28 MED ORDER — PANTOPRAZOLE SODIUM 40 MG PO TBEC: 40.0000 mg | DELAYED_RELEASE_TABLET | Freq: Every day | ORAL | 0 refills | Status: AC

## 2024-09-28 NOTE — Progress Notes (Signed)
 [facility]  Provider:  Jereld Serum DNP  Code Status:  Full Code  Goals of Care:     06/20/2024   10:02 AM  Advanced Directives  Does Patient Have a Medical Advance Directive? No     Chief Complaint  Patient presents with   Medication Management    Diabetes management and insulin  dosage review.    Discussed the use of AI scribe software for clinical note transcription with the patient, who gave verbal consent to proceed.  HPI: Patient is a 48 y.o. female seen today for medical management of chronic issues.  Virginia [name] is a 48 year old female with type 2 diabetes mellitus and chronic kidney disease stage 3B who presents with concerns about insulin  management and hypoglycemia.   She manages her type 2 diabetes mellitus with insulin , experiencing frequent nocturnal hypoglycemia with blood sugar levels dropping to the 50s and 60s. She takes 11-13 units of Lantus  insulin  at bedtime but has been reducing her dose due to these episodes. Her A1c was previously 8.1% and is now 6.6%.  She has chronic kidney disease stage 3B and was hospitalized on July 9th for elevated potassium levels and abdominal pain. Currently, she has no abdominal or epigastric pain.  She experiences symptoms of depression, including lack of interest and motivation. She is under the care of a psychiatrist, who recently increased her Prozac  dosage to 60 mg. She also takes alprazolam  1 mg at bedtime for anxiety and Xanax  1 mg three times a day as needed.  She has a history of acid reflux and takes famotidine  20 mg daily to manage her symptoms, which are well-controlled when she adheres to her medication regimen.  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 She reports impaired vision and difficulty with depth perception, affecting her ability to drive.  She has ADHD and is currently taking Adderall as prescribed by her  psychiatrist to help with focus, especially since she has returned to school for graduate studies.  She reports neuropathy symptoms, including burning sensations in her feet, which have worsened. She has previously tried gabapentin but did not tolerate it well.  Her social history includes a past of smoking, which she has quit, and occasional alcohol consumption. She lives with her parents, who are both 22 years old.  Her family history includes a father with adult-onset diabetes and aunts with diabetes. She is concerned about her family history of breast cancer, as several aunts have had the disease.     Past Medical History:  Diagnosis Date   Anxiety    Arthritis    Bipolar affective (HCC)    Depression    Diabetes mellitus    Hypertension    Renal disorder     Past Surgical History:  Procedure Laterality Date   CESAREAN SECTION     CHOLECYSTECTOMY     COLONOSCOPY     GASTRIC BYPASS     KNEE SURGERY      Allergies  Allergen Reactions   Ace Inhibitors Nausea And Vomiting and Cough   Banana Nausea And Vomiting   Lamictal [Lamotrigine] Nausea And Vomiting   Morphine  Itching   Nsaids Other (See Comments)    Kidney failure   Topiramate Nausea And Vomiting    Outpatient Encounter Medications as of 09/28/2024  Medication Sig   acetaminophen  (TYLENOL ) 325 MG tablet Take 2 tablets (650 mg total) by mouth every 6 (six) hours as  needed for mild pain (pain score 1-3) (or Fever >/= 101).   albuterol  (VENTOLIN  HFA) 108 (90 Base) MCG/ACT inhaler Inhale 2 puffs every 4 (four) hours as needed into the lungs for wheezing or shortness of breath.   ALPRAZolam  (XANAX ) 1 MG tablet Take 1 mg by mouth 3 (three) times daily as needed for anxiety.   amphetamine -dextroamphetamine  (ADDERALL) 20 MG tablet Take 20 mg by mouth 2 (two) times daily.   Blood Glucose Monitoring Suppl DEVI 1 each by Does not apply route 3 (three) times daily. May dispense any manufacturer covered by patient's insurance.    calcium -vitamin D  (OSCAL WITH D) 500-5 MG-MCG tablet Take 1 tablet by mouth 2 (two) times daily.   Continuous Glucose Sensor (FREESTYLE LIBRE 3 SENSOR) MISC USE AS DIRECTED TO CHECK GLUCOSE CONTINUOSLY. CHANGE SENSOR EVERY 14 DAYS   erythromycin ophthalmic ointment Place 1 Application into the left eye 2 (two) times daily.   FLUoxetine  HCl 60 MG TABS Take 60 mg by mouth daily.   Glucose Blood (BLOOD GLUCOSE TEST STRIPS) STRP 1 each by Does not apply route 3 (three) times daily. Use as directed to check blood sugar. May dispense any manufacturer covered by patient's insurance and fits patient's device.   Insulin  Pen Needle (PEN NEEDLES) 31G X 5 MM MISC 1 each by Does not apply route 3 (three) times daily. May dispense any manufacturer covered by patient's insurance.   Lancet Device MISC 1 each by Does not apply route 3 (three) times daily. May dispense any manufacturer covered by patient's insurance.   Lancets MISC 1 each by Does not apply route 3 (three) times daily. Use as directed to check blood sugar. May dispense any manufacturer covered by patient's insurance and fits patient's device.   loratadine  (CLARITIN ) 10 MG tablet Take 1 tablet (10 mg total) by mouth daily as needed for allergies.   Multiple Vitamin (MULTIVITAMIN WITH MINERALS) TABS tablet Take 1 tablet by mouth daily.   ofloxacin (OCUFLOX) 0.3 % ophthalmic solution Place 1 drop into the left eye 4 (four) times daily.   ondansetron  (ZOFRAN -ODT) 4 MG disintegrating tablet Take 1 tablet (4 mg total) by mouth every 6 (six) hours as needed for nausea or vomiting.   prednisoLONE acetate (PRED FORTE) 1 % ophthalmic suspension Place 1 drop into the left eye 4 (four) times daily.   Vitamin D , Ergocalciferol , (DRISDOL ) 1.25 MG (50000 UNIT) CAPS capsule Take 1 capsule (50,000 Units total) by mouth every 7 (seven) days.   [DISCONTINUED] famotidine  (PEPCID ) 20 MG tablet Take 1 tablet (20 mg total) by mouth daily.   [DISCONTINUED] insulin  glargine  (LANTUS ) 100 UNIT/ML Solostar Pen Inject 13 Units into the skin every evening.   [DISCONTINUED] pantoprazole  (PROTONIX ) 40 MG tablet Take 1 tablet (40 mg total) by mouth daily.   famotidine  (PEPCID ) 20 MG tablet Take 1 tablet (20 mg total) by mouth daily.   insulin  glargine (LANTUS ) 100 UNIT/ML Solostar Pen Inject 8 Units into the skin every evening.   pantoprazole  (PROTONIX ) 40 MG tablet Take 1 tablet (40 mg total) by mouth daily.   No facility-administered encounter medications on file as of 09/28/2024.    Review of Systems:  Review of Systems  Constitutional:  Negative for appetite change, chills, fatigue and fever.  HENT:  Negative for congestion, hearing loss, rhinorrhea and sore throat.   Eyes: Negative.   Respiratory:  Negative for cough, shortness of breath and wheezing.   Cardiovascular:  Negative for chest pain, palpitations and leg swelling.  Gastrointestinal:  Negative for abdominal pain, constipation, diarrhea, nausea and vomiting.  Genitourinary:  Negative for dysuria.  Musculoskeletal:  Negative for arthralgias, back pain and myalgias.  Skin:  Negative for color change, rash and wound.  Neurological:  Negative for dizziness, weakness and headaches.  Psychiatric/Behavioral:  Negative for behavioral problems. The patient is not nervous/anxious.     Health Maintenance  Topic Date Due   Hepatitis B Vaccines 19-59 Average Risk (1 of 3 - 19+ 3-dose series) Never done   Cervical Cancer Screening (HPV/Pap Cotest)  Never done   Mammogram  Never done   Pneumococcal Vaccine (3 of 3 - PCV) 09/10/2018   COVID-19 Vaccine (1 - 2025-26 season) Never done   HEMOGLOBIN A1C  11/24/2024   Diabetic kidney evaluation - Urine ACR  06/14/2025   FOOT EXAM  06/14/2025   Diabetic kidney evaluation - eGFR measurement  06/20/2025   OPHTHALMOLOGY EXAM  07/13/2025   DTaP/Tdap/Td (2 - Td or Tdap) 09/22/2025   Colonoscopy  07/29/2026   Influenza Vaccine  Completed   Hepatitis C Screening   Completed   HIV Screening  Completed   HPV VACCINES  Aged Out   Meningococcal B Vaccine  Aged Out    Physical Exam: Vitals:   09/28/24 0940  Temp: (!) 96.9 F (36.1 C)  SpO2: 95%  Weight: 178 lb 12.8 oz (81.1 kg)   Body mass index is 33.78 kg/m. Physical Exam Constitutional:      General: She is not in acute distress.    Appearance: She is obese.  HENT:     Head: Normocephalic and atraumatic.     Nose: Nose normal.     Mouth/Throat:     Mouth: Mucous membranes are moist.  Eyes:     Conjunctiva/sclera: Conjunctivae normal.  Cardiovascular:     Rate and Rhythm: Normal rate and regular rhythm.  Pulmonary:     Effort: Pulmonary effort is normal.     Breath sounds: Normal breath sounds.  Abdominal:     General: Bowel sounds are normal.     Palpations: Abdomen is soft.  Musculoskeletal:        General: Normal range of motion.     Cervical back: Normal range of motion.  Skin:    General: Skin is warm and dry.  Neurological:     General: No focal deficit present.     Mental Status: She is alert and oriented to person, place, and time.  Psychiatric:        Mood and Affect: Mood normal.        Behavior: Behavior normal.        Thought Content: Thought content normal.        Judgment: Judgment normal.     Labs reviewed: Basic Metabolic Panel: Recent Labs    02/03/24 2253 02/04/24 0711 05/24/24 1038 05/25/24 0529 05/27/24 0815 05/27/24 1803 05/28/24 1229 05/29/24 0630 05/30/24 0514 06/01/24 1533 06/14/24 1010 06/20/24 1039 06/20/24 1647 06/20/24 1846  NA  --    < > 132*   < > 133*   < > 135 138 139 139  --  134* 134*  --   K  --    < > 3.2*   < > 2.8*   < > 3.0* 3.8 3.2* 4.0  --  5.6* 6.1* 5.5*  CL  --    < > 107   < > 104   < > 104 108 110 108  --  103 105  --  CO2  --    < > 10*   < > 18*   < > 21* 22 19* 20*  --  15* 15*  --   GLUCOSE  --    < > 168*   < > 151*   < > 99 114* 122* 103*  --  145* 126*  --   BUN  --    < > 53*   < > 22*   < > 21* 16 11  14   --  17 18  --   CREATININE  --    < > 3.23*   < > 2.19*   < > 2.32* 2.17* 1.75* 1.75*  --  1.75* 1.75*  --   CALCIUM   --    < > 7.4*   < > 7.8*   < > 7.5* 7.3* 7.5* 8.3*  --  10.0 9.3  --   MG  --    < > 1.5*  --  1.7  --  2.1 1.8 1.5*  --  1.7  --   --   --   PHOS  --    < > 6.8*  --  3.1  --   --  3.5  --   --   --   --   --   --   TSH 1.231  --   --   --   --   --  1.603  --   --   --   --   --   --   --    < > = values in this interval not displayed.   Liver Function Tests: Recent Labs    05/30/24 0514 06/01/24 1533 06/20/24 1039  AST 19 28 18   ALT 14 17 14   ALKPHOS 64 118 124  BILITOT 0.3 0.4 0.8  PROT 5.1* 6.9 8.6*  ALBUMIN 1.8* 3.1* 4.2   Recent Labs    02/21/24 1628 06/01/24 1533 06/20/24 1039  LIPASE 24 11 13    Recent Labs    05/28/24 1229  AMMONIA 44*   CBC: Recent Labs    05/28/24 1229 05/29/24 0630 05/30/24 0514 06/01/24 1533 06/20/24 1039  WBC 5.4 6.5 7.8 11.5* 8.3  NEUTROABS 3.3 3.2  --   --  5.6  HGB 9.5* 9.3* 9.2* 10.5* 11.8*  HCT 27.7* 28.4* 28.2* 32.2* 36.6  MCV 82.4 85.0 84.9 85.4 89.7  PLT 304 334 359 434* 328   Lipid Panel: No results for input(s): CHOL, HDL, LDLCALC, TRIG, CHOLHDL, LDLDIRECT in the last 8760 hours. Lab Results  Component Value Date   HGBA1C 8.1 (H) 05/25/2024    Procedures since last visit: No results found.  Assessment/Plan  1. Type 2 diabetes mellitus with stage 3b chronic kidney disease, with long-term current use of insulin  (HCC) (Primary) -  Improved glycemic control with A1c reduced to 6.6%. Experiencing nocturnal hypoglycemia. Chronic kidney disease stage 3B and worsening diabetic neuropathy symptoms. Elevated potassium levels previously noted. - Reduce Lantus  insulin  to 8 units at bedtime. - Refill Lantus  prescription. - Check potassium levels today. - Encourage exercise and blood sugar control for neuropathy management. - insulin  glargine (LANTUS ) 100 UNIT/ML Solostar Pen; Inject 8 Units  into the skin every evening.  Dispense: 15 mL; Refill: 1  2. Anxiety -  stable -  PHQ-9 score of 11, ranging as moderate depression. Anxiety managed with alprazolam . Prozac  increased to 60 mg by psychiatrist. Regular follow-up with psychiatrist Dr. Kirt. - Continue Prozac  60 mg daily, prescribed  by psychiatry - Follow up with psychiatrist Dr. Kirt as needed. -  continue Alprazolam  1 mg TID PRN, prescribed by psychiatry - Follows up with psychiatry  3. Hyperkalemia Lab Results  Component Value Date   K 5.5 (H) 06/20/2024   -  Previously noted hyperkalemia during a hospital visit. - Check potassium levels today. - Basic Metabolic Panel  4. Gastritis without bleeding, unspecified chronicity, unspecified gastritis type -  stable -  does not take Protonix  -  continue Famotidine  20 mg daily - famotidine  (PEPCID ) 20 MG tablet; Take 1 tablet (20 mg total) by mouth daily.  Dispense: 90 tablet; Refill: 1  5. Attention deficit hyperactivity disorder (ADHD), predominantly inattentive type -  managed with Adderall. Symptoms exacerbated by return to school. - Continue Adderall as prescribed by psychiatrist. - amphetamine -dextroamphetamine  (ADDERALL) 20 MG tablet; Take 20 mg by mouth 2 (two) times daily.  6. Immunization due - Flu vaccine HIGH DOSE PF(Fluzone Trivalent)  7. Screening for cervical cancer - Ambulatory referral to Obstetrics / Gynecology  8. Screening mammogram for breast cancer - MM 3D SCREENING MAMMOGRAM BILATERAL BREAST  9. Seasonal allergies -  Experiencing fall allergies with sneezing, runny nose, and ear fullness. - loratadine  (CLARITIN ) 10 MG tablet; Take 1 tablet (10 mg total) by mouth daily as needed for allergies.     Labs/tests ordered:  None   Return in about 3 months (around 12/29/2024).  Aspen Lawrance Medina-Vargas, NP

## 2024-09-29 LAB — BASIC METABOLIC PANEL WITH GFR
BUN/Creatinine Ratio: 18 (calc) (ref 6–22)
BUN: 28 mg/dL — ABNORMAL HIGH (ref 7–25)
CO2: 21 mmol/L (ref 20–32)
Calcium: 8.4 mg/dL — ABNORMAL LOW (ref 8.6–10.2)
Chloride: 109 mmol/L (ref 98–110)
Creat: 1.56 mg/dL — ABNORMAL HIGH (ref 0.50–0.99)
Glucose, Bld: 141 mg/dL — ABNORMAL HIGH (ref 65–99)
Potassium: 4.5 mmol/L (ref 3.5–5.3)
Sodium: 139 mmol/L (ref 135–146)
eGFR: 41 mL/min/1.73m2 — ABNORMAL LOW (ref >=60)

## 2024-09-30 ENCOUNTER — Ambulatory Visit: Payer: Self-pay | Admitting: Adult Health

## 2024-09-30 NOTE — Progress Notes (Signed)
-    K+ normal -  continue current medications.

## 2024-10-04 ENCOUNTER — Other Ambulatory Visit: Payer: Self-pay | Admitting: Medical Genetics

## 2024-10-04 DIAGNOSIS — Z006 Encounter for examination for normal comparison and control in clinical research program: Secondary | ICD-10-CM

## 2024-11-27 ENCOUNTER — Telehealth: Payer: Self-pay

## 2024-11-27 NOTE — Telephone Encounter (Signed)
 Incoming fax received from patients pharmacy to initiate a prior authorization for                   .  PA initiated through covermymeds. Key: [id number]   Awaiting reply from the insurance company which will be determined in 48-72 hours.

## 2024-11-28 NOTE — Telephone Encounter (Signed)
 Approval letter received,see media.Pharmacy notified.

## 2024-12-14 ENCOUNTER — Encounter: Payer: Self-pay | Admitting: Adult Health

## 2024-12-14 DIAGNOSIS — E1122 Type 2 diabetes mellitus with diabetic chronic kidney disease: Secondary | ICD-10-CM

## 2024-12-17 MED ORDER — INSULIN GLARGINE 100 UNIT/ML SOLOSTAR PEN: 8.0000 [IU] | PEN_INJECTOR | Freq: Every evening | SUBCUTANEOUS | 1 refills | Status: AC

## 2024-12-17 MED ORDER — PEN NEEDLES 31G X 5 MM MISC: 1.0000 | Freq: Every day | 0 refills | Status: AC

## 2024-12-31 ENCOUNTER — Ambulatory Visit: Payer: Self-pay | Admitting: Adult Health

## 2024-12-31 ENCOUNTER — Encounter: Payer: Self-pay | Admitting: Adult Health

## 2024-12-31 VITALS — BP 138/96 | HR 78 | Temp 97.5°F | Ht 61.0 in | Wt 188.4 lb

## 2024-12-31 DIAGNOSIS — F319 Bipolar disorder, unspecified: Secondary | ICD-10-CM

## 2024-12-31 DIAGNOSIS — N1832 Chronic kidney disease, stage 3b: Secondary | ICD-10-CM | POA: Diagnosis not present

## 2024-12-31 DIAGNOSIS — N390 Urinary tract infection, site not specified: Secondary | ICD-10-CM

## 2024-12-31 DIAGNOSIS — F900 Attention-deficit hyperactivity disorder, predominantly inattentive type: Secondary | ICD-10-CM

## 2024-12-31 DIAGNOSIS — M545 Low back pain, unspecified: Secondary | ICD-10-CM | POA: Diagnosis not present

## 2024-12-31 DIAGNOSIS — K2970 Gastritis, unspecified, without bleeding: Secondary | ICD-10-CM | POA: Diagnosis not present

## 2024-12-31 DIAGNOSIS — Z6835 Body mass index (BMI) 35.0-35.9, adult: Secondary | ICD-10-CM

## 2024-12-31 DIAGNOSIS — Z794 Long term (current) use of insulin: Secondary | ICD-10-CM | POA: Diagnosis not present

## 2024-12-31 DIAGNOSIS — Z124 Encounter for screening for malignant neoplasm of cervix: Secondary | ICD-10-CM

## 2024-12-31 DIAGNOSIS — F339 Major depressive disorder, recurrent, unspecified: Secondary | ICD-10-CM | POA: Diagnosis not present

## 2024-12-31 DIAGNOSIS — E1122 Type 2 diabetes mellitus with diabetic chronic kidney disease: Secondary | ICD-10-CM | POA: Diagnosis not present

## 2024-12-31 DIAGNOSIS — E559 Vitamin D deficiency, unspecified: Secondary | ICD-10-CM | POA: Diagnosis not present

## 2024-12-31 DIAGNOSIS — F411 Generalized anxiety disorder: Secondary | ICD-10-CM

## 2024-12-31 DIAGNOSIS — E66812 Obesity, class 2: Secondary | ICD-10-CM | POA: Diagnosis not present

## 2024-12-31 LAB — POCT URINALYSIS DIPSTICK
Glucose, UA: POSITIVE — AB
Nitrite, UA: POSITIVE
Protein, UA: POSITIVE — AB
Spec Grav, UA: 1.02
Urobilinogen, UA: 0.2 U/dL
pH, UA: 6

## 2024-12-31 MED ORDER — AMOXICILLIN-POT CLAVULANATE 875-125 MG PO TABS: 1.0000 | ORAL_TABLET | Freq: Two times a day (BID) | ORAL | 0 refills | Status: AC

## 2024-12-31 MED ORDER — FLUCONAZOLE 150 MG PO TABS: 150.0000 mg | ORAL_TABLET | Freq: Once | ORAL | 0 refills | Status: AC

## 2024-12-31 MED ORDER — VITAMIN D (ERGOCALCIFEROL) 1.25 MG (50000 UNIT) PO CAPS: 50000.0000 [IU] | ORAL_CAPSULE | ORAL | 1 refills | Status: AC

## 2024-12-31 NOTE — Patient Instructions (Signed)
 Due for Pneumonia, Hep B, and covid vaccines. Can obtain at local pharmacy.

## 2024-12-31 NOTE — Progress Notes (Signed)
 "  [facility]  Provider:  Jereld Serum DNP  Code Status:  Full Code  Goals of Care:     06/20/2024   10:02 AM  Advanced Directives  Does Patient Have a Medical Advance Directive? No     Chief Complaint  Patient presents with   Medication management of chronic issues    3 month follow up. Discuss the need for lab work, mammogram, and pap smear screening. Also discuss the need for Pneumonia, Hep B, and covid vaccines.   Discussed the use of AI scribe software for clinical note transcription with the patient, who gave verbal consent to proceed.   HPI: Patient is a 49 y.o. female seen today for a 38-month follow up of chronic medical issues.  She has experienced elevated blood glucose levels over the past week and a half, despite her management efforts. She has been diabetic since age 54 and is currently on Lantus  8 units in the evening. Her A1c is 8.7, which is higher than her previous visit. She uses a Libre 3 for glucose monitoring and has frequent high readings. A past gastric bypass significantly aided her diabetes management.  She reports significant right-sided back pain, rated as 7 out of 10 in severity, primarily located in the right flank and occasionally affecting the left side, especially during urination. This pain has persisted for about a week and a half. She has been flushing with water  for two to three days, which alleviated urinary urgency but did not improve the back pain. No chills, but she experiences general malaise.  She has a history of syncope episodes associated with severe stomach pain, experiencing dizziness and tingling in her hands before fainting. Her mother has a history of nerve syncope, and although she has not been officially diagnosed, she has had similar episodes.  She has bipolar II disorder and is currently taking Latuda 20 mg, with plans to increase to 40 mg. She also takes Prozac  60 mg daily for depression and Adderall 20 mg twice a day for  ADHD. She feels very depressed and anxious, with constant worry and overwhelming feelings. She takes Xanax  1 mg twice a day as needed for anxiety.  She has a history of gastritis and is currently taking Protonix  40 mg and famotidine  20 mg daily. Her gastritis is well-managed with these preventative medications.  She has a history of vitamin D  deficiency and takes vitamin D  50,000 units weekly. She also takes a probiotic to manage irritable bowel symptoms, which she finds effective.  Her social history includes having a 40 year old son who recently graduated from [employer] and lives independently.           Past Medical History:  Diagnosis Date   ADHD    Anxiety    Arthritis    Bipolar 2 disorder (HCC)    Bipolar affective (HCC)    Depression    Diabetes mellitus    Hypertension    Renal disorder     Past Surgical History:  Procedure Laterality Date   CESAREAN SECTION     CHOLECYSTECTOMY     COLONOSCOPY     GASTRIC BYPASS     KNEE SURGERY      Allergies[1]  Outpatient Encounter Medications as of 12/31/2024  Medication Sig   acetaminophen  (TYLENOL ) 325 MG tablet Take 2 tablets (650 mg total) by mouth every 6 (six) hours as needed for mild pain (pain score 1-3) (or Fever >/= 101).   albuterol  (VENTOLIN  HFA) 108 (90 Base) MCG/ACT  inhaler Inhale 2 puffs every 4 (four) hours as needed into the lungs for wheezing or shortness of breath.   ALPRAZolam  (XANAX ) 1 MG tablet Take 1 mg by mouth 3 (three) times daily as needed for anxiety.   amphetamine -dextroamphetamine  (ADDERALL) 20 MG tablet Take 20 mg by mouth 2 (two) times daily.   Blood Glucose Monitoring Suppl DEVI 1 each by Does not apply route 3 (three) times daily. May dispense any manufacturer covered by patient's insurance.   calcium -vitamin D  (OSCAL WITH D) 500-5 MG-MCG tablet Take 1 tablet by mouth 2 (two) times daily.   Continuous Glucose Sensor (FREESTYLE LIBRE 3 SENSOR) MISC USE AS DIRECTED TO CHECK GLUCOSE CONTINUOSLY.  CHANGE SENSOR EVERY 14 DAYS   erythromycin ophthalmic ointment Place 1 Application into the left eye 2 (two) times daily.   famotidine  (PEPCID ) 20 MG tablet Take 1 tablet (20 mg total) by mouth daily.   FLUoxetine  HCl 60 MG TABS Take 60 mg by mouth daily.   Glucose Blood (BLOOD GLUCOSE TEST STRIPS) STRP 1 each by Does not apply route 3 (three) times daily. Use as directed to check blood sugar. May dispense any manufacturer covered by patient's insurance and fits patient's device.   insulin  glargine (LANTUS ) 100 UNIT/ML Solostar Pen Inject 8 Units into the skin every evening.   Insulin  Pen Needle (PEN NEEDLES) 31G X 5 MM MISC 1 each by Does not apply route daily. May dispense any manufacturer covered by patient's insurance. Dx:E11.22   Lancet Device MISC 1 each by Does not apply route 3 (three) times daily. May dispense any manufacturer covered by patient's insurance.   loratadine  (CLARITIN ) 10 MG tablet Take 1 tablet (10 mg total) by mouth daily as needed for allergies.   lurasidone (LATUDA) 20 MG TABS tablet Take 20 mg by mouth daily.   Multiple Vitamin (MULTIVITAMIN WITH MINERALS) TABS tablet Take 1 tablet by mouth daily.   ofloxacin (OCUFLOX) 0.3 % ophthalmic solution Place 1 drop into the left eye 4 (four) times daily.   ondansetron  (ZOFRAN -ODT) 4 MG disintegrating tablet Take 1 tablet (4 mg total) by mouth every 6 (six) hours as needed for nausea or vomiting.   pantoprazole  (PROTONIX ) 40 MG tablet Take 1 tablet (40 mg total) by mouth daily.   prednisoLONE acetate (PRED FORTE) 1 % ophthalmic suspension Place 1 drop into the left eye 4 (four) times daily.   Vitamin D , Ergocalciferol , (DRISDOL ) 1.25 MG (50000 UNIT) CAPS capsule Take 1 capsule (50,000 Units total) by mouth every 7 (seven) days.   Lancets MISC 1 each by Does not apply route 3 (three) times daily. Use as directed to check blood sugar. May dispense any manufacturer covered by patient's insurance and fits patient's device. (Patient not  taking: Reported on 12/31/2024)   No facility-administered encounter medications on file as of 12/31/2024.    Review of Systems:  Review of Systems  Constitutional:  Negative for appetite change, chills, fatigue and fever.  HENT:  Negative for congestion, hearing loss, rhinorrhea and sore throat.   Eyes: Negative.   Respiratory:  Negative for cough, shortness of breath and wheezing.   Cardiovascular:  Negative for chest pain, palpitations and leg swelling.  Gastrointestinal:  Negative for abdominal pain, constipation, diarrhea, nausea and vomiting.  Genitourinary:  Positive for frequency. Negative for dysuria.  Musculoskeletal:  Positive for back pain. Negative for arthralgias and myalgias.  Skin:  Negative for color change, rash and wound.  Neurological:  Negative for dizziness, weakness and headaches.  Psychiatric/Behavioral:  Negative for behavioral problems. The patient is not nervous/anxious.     Health Maintenance  Topic Date Due   Cervical Cancer Screening (HPV/Pap Cotest)  Never done   Mammogram  Never done   HEMOGLOBIN A1C  11/24/2024   Diabetic kidney evaluation - Urine ACR  12/15/2024   COVID-19 Vaccine (1 - 2025-26 season) 01/16/2025 (Originally 08/13/2024)   Pneumococcal Vaccine (3 of 3 - PCV) 12/31/2025 (Originally 09/10/2018)   Hepatitis B Vaccines 19-59 Average Risk (1 of 3 - 19+ 3-dose series) 12/31/2025 (Originally 08/14/1996)   FOOT EXAM  06/14/2025   OPHTHALMOLOGY EXAM  07/13/2025   DTaP/Tdap/Td (2 - Td or Tdap) 09/22/2025   Diabetic kidney evaluation - eGFR measurement  09/28/2025   Colonoscopy  07/29/2026   Influenza Vaccine  Completed   HPV VACCINES (No Doses Required) Completed   Hepatitis C Screening  Completed   HIV Screening  Completed   Meningococcal B Vaccine  Aged Out    Physical Exam: Vitals:   12/31/24 0955 12/31/24 1000  BP: (!) 128/90 (!) 138/96  Pulse: 78   Temp: (!) 97.5 F (36.4 C)   SpO2: 99%   Weight: 188 lb 6.4 oz (85.5 kg)   Height:  5' 1 (1.549 m)    Body mass index is 35.6 kg/m. Physical Exam Constitutional:      Appearance: Normal appearance.  HENT:     Head: Normocephalic and atraumatic.     Nose: Nose normal.     Mouth/Throat:     Mouth: Mucous membranes are moist.  Eyes:     Conjunctiva/sclera: Conjunctivae normal.  Cardiovascular:     Rate and Rhythm: Normal rate and regular rhythm.  Pulmonary:     Effort: Pulmonary effort is normal.     Breath sounds: Normal breath sounds.  Abdominal:     General: Bowel sounds are normal.     Palpations: Abdomen is soft.  Musculoskeletal:        General: Normal range of motion.     Cervical back: Normal range of motion.  Skin:    General: Skin is warm and dry.  Neurological:     General: No focal deficit present.     Mental Status: She is alert and oriented to person, place, and time.  Psychiatric:        Mood and Affect: Mood normal.        Behavior: Behavior normal.        Thought Content: Thought content normal.        Judgment: Judgment normal.     Labs reviewed: Basic Metabolic Panel: Recent Labs    02/03/24 2253 02/04/24 0711 05/24/24 1038 05/25/24 0529 05/27/24 0815 05/27/24 1803 05/28/24 1229 05/29/24 0630 05/30/24 0514 06/01/24 1533 06/14/24 1010 06/20/24 1039 06/20/24 1647 06/20/24 1846 09/28/24 1037  NA  --    < > 132*   < > 133*   < > 135 138 139   < >  --  134* 134*  --  139  K  --    < > 3.2*   < > 2.8*   < > 3.0* 3.8 3.2*   < >  --  5.6* 6.1* 5.5* 4.5  CL  --    < > 107   < > 104   < > 104 108 110   < >  --  103 105  --  109  CO2  --    < > 10*   < > 18*   < >  21* 22 19*   < >  --  15* 15*  --  21  GLUCOSE  --    < > 168*   < > 151*   < > 99 114* 122*   < >  --  145* 126*  --  141*  BUN  --    < > 53*   < > 22*   < > 21* 16 11   < >  --  17 18  --  28*  CREATININE  --    < > 3.23*   < > 2.19*   < > 2.32* 2.17* 1.75*   < >  --  1.75* 1.75*  --  1.56*  CALCIUM   --    < > 7.4*   < > 7.8*   < > 7.5* 7.3* 7.5*   < >  --  10.0 9.3   --  8.4*  MG  --    < > 1.5*  --  1.7  --  2.1 1.8 1.5*  --  1.7  --   --   --   --   PHOS  --    < > 6.8*  --  3.1  --   --  3.5  --   --   --   --   --   --   --   TSH 1.231  --   --   --   --   --  1.603  --   --   --   --   --   --   --   --    < > = values in this interval not displayed.   Liver Function Tests: Recent Labs    05/30/24 0514 06/01/24 1533 06/20/24 1039  AST 19 28 18   ALT 14 17 14   ALKPHOS 64 118 124  BILITOT 0.3 0.4 0.8  PROT 5.1* 6.9 8.6*  ALBUMIN 1.8* 3.1* 4.2   Recent Labs    02/21/24 1628 06/01/24 1533 06/20/24 1039  LIPASE 24 11 13    Recent Labs    05/28/24 1229  AMMONIA 44*   CBC: Recent Labs    05/28/24 1229 05/29/24 0630 05/30/24 0514 06/01/24 1533 06/20/24 1039  WBC 5.4 6.5 7.8 11.5* 8.3  NEUTROABS 3.3 3.2  --   --  5.6  HGB 9.5* 9.3* 9.2* 10.5* 11.8*  HCT 27.7* 28.4* 28.2* 32.2* 36.6  MCV 82.4 85.0 84.9 85.4 89.7  PLT 304 334 359 434* 328   Lipid Panel: No results for input(s): CHOL, HDL, LDLCALC, TRIG, CHOLHDL, LDLDIRECT in the last 8760 hours. Lab Results  Component Value Date   HGBA1C 8.1 (H) 05/25/2024    Procedures since last visit: No results found.  Assessment/Plan  1. Type 2 diabetes mellitus with stage 3b chronic kidney disease, with long-term current use of insulin  (HCC) (Primary) -  Recent hyperglycemia, possibly due to possible urinary tract infection. A1c is 8.7, indicating suboptimal control. Stage 3b CKD requires careful medication management to avoid nephrotoxicity. -  continue Lantus  8 units subcutaneously in the evening - Lipid Panel - Hemoglobin A1c - Basic Metabolic Panel - Microalbumin/Creatinine Ratio, Urine  2. Acute right-sided low back pain without sciatica Urinary tract infection without hematuria, site unspecified -  Severe acute right-sided low back pain, possibly related to a suspected UTI. -  Symptoms suggestive of UTI, including right-sided back pain and previous urinary  urgency. Symptoms improved with increased water  intake. -  will start on Augmentin   for possible infection per urine dipstick urinalysis -  discussed with patient  - POC Urinalysis Dipstick showed positive glucose, positive protein, positive nitrite, trace leukocytes - Culture, Urine -  amoxicillin -clavulanate (AUGMENTIN ) 875-125 MG tablet; Take 1 tablet by mouth 2 (two) times daily for 5 days.  Dispense: 10 tablet; Refill: 0 - fluconazole  (DIFLUCAN ) 150 MG tablet; Take 1 tablet (150 mg total) by mouth once for 1 dose.  Dispense: 1 tablet; Refill: 0  3. Gastritis without bleeding, unspecified chronicity, unspecified gastritis type -  Continue Protonix  40 mg daily. - Continue famotidine  20 mg daily.  4. Attention deficit hyperactivity disorder (ADHD), predominantly inattentive type -  continue Adderall 20 mg BID -  follows up with psych - Lipid Panel  5. Vitamin D  deficiency -  Managed with weekly supplementation. Last vitamin D  level was low. - Vitamin D , Ergocalciferol , (DRISDOL ) 1.25 MG (50000 UNIT) CAPS capsule; Take 1 capsule (50,000 Units total) by mouth every 7 (seven) days.  Dispense: 4 capsule; Refill: 1 - Vitamin D , 25-hydroxy  6. Major depression, recurrent, chronic -  PHQ-9 score 0 - Continue fluoxetine  60 mg 1 tab daily -   Follows up with psych - Lipid Panel  7. GAD (generalized anxiety disorder) -   mood is stable -  Continue Xanax  1 mg p.o. 3 times a day as needed -  Follows up with psych     8. Screening for cervical cancer - Ambulatory referral to Obstetrics / Gynecology  9. Bipolar 1 disorder (HCC) -Recently started on Latuda by psych - lurasidone (LATUDA) 20 MG TABS tablet; Take 20 mg by mouth daily. - Lipid Panel  10. Class 2 obesity with body mass index (BMI) of 35.0 to 35.9 in adult, unspecified obesity type, unspecified whether serious comorbidity present -  Recent weight gain, possibly related to increased appetite and dietary habits  post-hospitalization for salmonella. - Continue monitoring weight and dietary habits. -  counseled on diet and exercise      Labs/tests ordered:  A1C, lipid panel BMP   Return in about 3 months (around 03/31/2025).  Nils Thor Medina-Vargas, NP      [1]  Allergies Allergen Reactions   Ace Inhibitors Nausea And Vomiting and Cough   Banana Nausea And Vomiting   Lamictal [Lamotrigine] Nausea And Vomiting   Morphine  Itching   Nsaids Other (See Comments)    Kidney failure   Topiramate Nausea And Vomiting   "

## 2025-01-02 ENCOUNTER — Other Ambulatory Visit: Payer: Self-pay | Admitting: Adult Health

## 2025-01-02 ENCOUNTER — Ambulatory Visit: Payer: Self-pay | Admitting: Adult Health

## 2025-01-02 DIAGNOSIS — E1122 Type 2 diabetes mellitus with diabetic chronic kidney disease: Secondary | ICD-10-CM

## 2025-01-02 LAB — LIPID PANEL
Cholesterol: 196 mg/dL
HDL: 86 mg/dL
LDL Cholesterol (Calc): 93 mg/dL
Non-HDL Cholesterol (Calc): 110 mg/dL
Total CHOL/HDL Ratio: 2.3 (calc)
Triglycerides: 78 mg/dL

## 2025-01-02 LAB — BASIC METABOLIC PANEL WITH GFR
BUN/Creatinine Ratio: 18 (calc) (ref 6–22)
BUN: 32 mg/dL — ABNORMAL HIGH (ref 7–25)
CO2: 21 mmol/L (ref 20–32)
Calcium: 7.6 mg/dL — ABNORMAL LOW (ref 8.6–10.2)
Chloride: 106 mmol/L (ref 98–110)
Creat: 1.77 mg/dL — ABNORMAL HIGH (ref 0.50–0.99)
Glucose, Bld: 243 mg/dL — ABNORMAL HIGH (ref 65–99)
Potassium: 4.3 mmol/L (ref 3.5–5.3)
Sodium: 136 mmol/L (ref 135–146)
eGFR: 35 mL/min/1.73m2 — ABNORMAL LOW

## 2025-01-02 LAB — URINE CULTURE
MICRO NUMBER:: 17487208
SPECIMEN QUALITY:: ADEQUATE

## 2025-01-02 LAB — MICROALBUMIN / CREATININE URINE RATIO
Creatinine, Urine: 92 mg/dL (ref 20–275)
Microalb Creat Ratio: 1534 mg/g{creat} — ABNORMAL HIGH
Microalb, Ur: 141.1 mg/dL

## 2025-01-02 LAB — HEMOGLOBIN A1C
Hgb A1c MFr Bld: 8.6 % — ABNORMAL HIGH
Mean Plasma Glucose: 200 mg/dL
eAG (mmol/L): 11.1 mmol/L

## 2025-01-02 LAB — VITAMIN D 25 HYDROXY (VIT D DEFICIENCY, FRACTURES): Vit D, 25-Hydroxy: 26 ng/mL — ABNORMAL LOW (ref 30–100)

## 2025-01-02 MED ORDER — EMPAGLIFLOZIN 10 MG PO TABS: 10.0000 mg | ORAL_TABLET | Freq: Every day | ORAL | 3 refills | Status: AC

## 2025-01-02 NOTE — Progress Notes (Signed)
-    A1C 8.6, up from 8.1, will start Jardiance  10 mg daily -  GFR 35, continues to range as chronic kidney disease stage 3B -  urine microalbumin creatinine ratio elevated, will need to get better control of blood sugar, will add Jardiance  for kidney protection -  vitamin D  level 26, up from 17, continue Vitamin D  supplementation -  lipid panel normal

## 2025-01-05 ENCOUNTER — Encounter: Payer: Self-pay | Admitting: Adult Health

## 2025-01-05 ENCOUNTER — Telehealth: Admitting: Nurse Practitioner

## 2025-01-05 DIAGNOSIS — M545 Low back pain, unspecified: Secondary | ICD-10-CM

## 2025-01-05 MED ORDER — METHOCARBAMOL 500 MG PO TABS: 500.0000 mg | ORAL_TABLET | Freq: Three times a day (TID) | ORAL | 0 refills | Status: AC | PRN

## 2025-01-05 NOTE — Progress Notes (Signed)
 We are sorry that you are not feeling well.  Here is how we plan to help!  Based on what you have shared with me it, appears you may be experiencing acute back pain.   Acute back pain is defined as musculoskeletal pain that can resolve in 1-3 weeks with conservative treatment.  I have prescribed  muscle relaxant, methocarbamol . Some patients experience stomach irritation or in increased heartburn with anti-inflammatory drugs.  Please keep in mind that muscle relaxer's can cause fatigue and should not be taken while at work or driving.  Back pain is very common.  The pain often gets better over time.  The cause of back pain is usually not dangerous.  Most people can learn to manage their back pain on their own.  Home Care Stay active.  Start with short walks on flat ground if you can.  Try to walk farther each day. Do not sit, drive or stand in one place for more than 30 minutes.  Do not stay in bed. Do not fully avoid exercise or work.  Activity can help your back heal faster. Be careful when you bend or lift an object.  Bend at your knees, keep the object close to you, and do not twist. Sleep on a firm mattress.  Lie on your side, and bend your knees.  If you lie on your back, put a pillow under your knees. Only take medicines as told by your doctor. Put ice on the injured area. Put ice in a plastic bag Place a towel between your skin and the bag Leave the ice on for 15-20 minutes, 3-4 times a day for the first 2-3 days.  After that, you can switch between ice and heat packs. Ask your doctor about back exercises or massage.   Get Help Right Way If: Your pain does not go away with rest and treatments given today. Your pain does not go away within 1 week. You have new problems. You do not feel well. The pain spreads into your legs. You cannot control when you poop (bowel movement) or pee (urinate). You feel sick to your stomach (nauseous) or throw up (vomit). You have belly (abdominal)  pain. You feel like you may pass out (faint). If you develop a fever.  Make Sure you: Understand these instructions. Continue to monitor your condition for any changes. Will get help right away if you are not doing well or get worse.  Your e-visit answers were reviewed by a board certified advanced clinical practitioner to complete your personal care plan.  Depending on the condition, your plan could have included both over the counter or prescription medications.  If there is a problem, please reply once you have received a response from your provider.  Your safety is important to us .  If you have drug allergies, check your prescription carefully.    You can use MyChart to ask questions about todays visit, request a non-urgent call back, or ask for a work or school excuse for 24 hours related to this e-Visit. If it has been greater than 24 hours you will need to follow up with your provider or enter a new e-Visit to address those concerns.  You will get an e-mail in the next two days asking about your experience.  I hope that your e-visit has been valuable and will speed your recovery. Thank you for using e-visits.   I have spent 5 minutes in review of e-visit questionnaire, review and updating patient chart, medical decision  making and response to patient.   Keionna Kinnaird W Culver Feighner, NP

## 2025-01-07 NOTE — Telephone Encounter (Signed)
 Jereld is out of office, sending to covering provider

## 2025-02-06 ENCOUNTER — Encounter: Payer: Self-pay | Admitting: Obstetrics and Gynecology

## 2025-04-09 ENCOUNTER — Ambulatory Visit: Admitting: Adult Health
